# Patient Record
Sex: Male | Born: 1960 | Race: Black or African American | Hispanic: No | Marital: Single | Smoking: Current every day smoker
Health system: Southern US, Community
[De-identification: ages and names within clinical notes are randomized; demographics above are authoritative.]

## PROBLEM LIST (undated history)

## (undated) DIAGNOSIS — I639 Cerebral infarction, unspecified: Secondary | ICD-10-CM

## (undated) DIAGNOSIS — N189 Chronic kidney disease, unspecified: Secondary | ICD-10-CM

## (undated) DIAGNOSIS — I10 Essential (primary) hypertension: Secondary | ICD-10-CM

---

## 2005-05-05 ENCOUNTER — Emergency Department (HOSPITAL_COMMUNITY): Admission: EM | Admit: 2005-05-05 | Discharge: 2005-05-05 | Payer: Self-pay | Admitting: Emergency Medicine

## 2006-08-15 ENCOUNTER — Emergency Department (HOSPITAL_COMMUNITY): Admission: EM | Admit: 2006-08-15 | Discharge: 2006-08-15 | Payer: Self-pay | Admitting: Emergency Medicine

## 2007-03-28 ENCOUNTER — Emergency Department (HOSPITAL_COMMUNITY): Admission: EM | Admit: 2007-03-28 | Discharge: 2007-03-28 | Payer: Self-pay | Admitting: Emergency Medicine

## 2007-07-29 ENCOUNTER — Emergency Department (HOSPITAL_COMMUNITY): Admission: EM | Admit: 2007-07-29 | Discharge: 2007-07-29 | Payer: Self-pay | Admitting: Emergency Medicine

## 2007-09-27 ENCOUNTER — Emergency Department (HOSPITAL_COMMUNITY): Admission: EM | Admit: 2007-09-27 | Discharge: 2007-09-28 | Payer: Self-pay | Admitting: Emergency Medicine

## 2007-10-04 ENCOUNTER — Emergency Department (HOSPITAL_COMMUNITY): Admission: EM | Admit: 2007-10-04 | Discharge: 2007-10-04 | Payer: Self-pay | Admitting: Emergency Medicine

## 2008-04-23 ENCOUNTER — Emergency Department (HOSPITAL_COMMUNITY): Admission: EM | Admit: 2008-04-23 | Discharge: 2008-04-23 | Payer: Self-pay | Admitting: Emergency Medicine

## 2008-04-29 ENCOUNTER — Emergency Department (HOSPITAL_COMMUNITY): Admission: EM | Admit: 2008-04-29 | Discharge: 2008-04-29 | Payer: Self-pay | Admitting: Emergency Medicine

## 2009-08-24 ENCOUNTER — Emergency Department (HOSPITAL_COMMUNITY): Admission: EM | Admit: 2009-08-24 | Discharge: 2009-08-24 | Payer: Self-pay | Admitting: Emergency Medicine

## 2009-09-19 ENCOUNTER — Emergency Department (HOSPITAL_COMMUNITY): Admission: EM | Admit: 2009-09-19 | Discharge: 2009-09-19 | Payer: Self-pay | Admitting: Emergency Medicine

## 2010-02-21 ENCOUNTER — Emergency Department (HOSPITAL_COMMUNITY): Admission: EM | Admit: 2010-02-21 | Discharge: 2010-02-21 | Payer: Self-pay | Admitting: Emergency Medicine

## 2011-05-14 LAB — BASIC METABOLIC PANEL
Calcium: 8.6
GFR calc Af Amer: >60
GFR calc non Af Amer: >60
Glucose, Bld: 113 — ABNORMAL HIGH
Sodium: 137

## 2011-05-14 LAB — ETHANOL: Alcohol, Ethyl (B): 173 — ABNORMAL HIGH

## 2011-05-14 LAB — RAPID URINE DRUG SCREEN, HOSP PERFORMED
Amphetamines: NOT DETECTED
Opiates: NOT DETECTED

## 2021-12-24 ENCOUNTER — Other Ambulatory Visit: Payer: Self-pay

## 2021-12-24 ENCOUNTER — Inpatient Hospital Stay (HOSPITAL_COMMUNITY): Payer: Medicaid Other

## 2021-12-24 ENCOUNTER — Other Ambulatory Visit (HOSPITAL_COMMUNITY): Payer: Self-pay

## 2021-12-24 ENCOUNTER — Inpatient Hospital Stay (HOSPITAL_COMMUNITY)
Admission: EM | Admit: 2021-12-24 | Discharge: 2022-01-04 | DRG: 674 | Disposition: A | Discharge status: Skilled Nursing Facility | Payer: Medicaid Other | Attending: Internal Medicine | Admitting: Internal Medicine

## 2021-12-24 ENCOUNTER — Encounter (HOSPITAL_COMMUNITY): Payer: Self-pay | Admitting: Internal Medicine

## 2021-12-24 ENCOUNTER — Emergency Department (HOSPITAL_COMMUNITY): Payer: Medicaid Other

## 2021-12-24 DIAGNOSIS — E440 Moderate protein-calorie malnutrition: Secondary | ICD-10-CM | POA: Diagnosis present

## 2021-12-24 DIAGNOSIS — I120 Hypertensive chronic kidney disease with stage 5 chronic kidney disease or end stage renal disease: Secondary | ICD-10-CM | POA: Diagnosis present

## 2021-12-24 DIAGNOSIS — E8809 Other disorders of plasma-protein metabolism, not elsewhere classified: Secondary | ICD-10-CM | POA: Diagnosis present

## 2021-12-24 DIAGNOSIS — Z993 Dependence on wheelchair: Secondary | ICD-10-CM | POA: Diagnosis not present

## 2021-12-24 DIAGNOSIS — N186 End stage renal disease: Secondary | ICD-10-CM | POA: Diagnosis present

## 2021-12-24 DIAGNOSIS — Z79899 Other long term (current) drug therapy: Secondary | ICD-10-CM | POA: Diagnosis not present

## 2021-12-24 DIAGNOSIS — Z7902 Long term (current) use of antithrombotics/antiplatelets: Secondary | ICD-10-CM | POA: Diagnosis not present

## 2021-12-24 DIAGNOSIS — Z681 Body mass index (BMI) 19 or less, adult: Secondary | ICD-10-CM

## 2021-12-24 DIAGNOSIS — J9811 Atelectasis: Secondary | ICD-10-CM | POA: Diagnosis present

## 2021-12-24 DIAGNOSIS — F1010 Alcohol abuse, uncomplicated: Secondary | ICD-10-CM | POA: Diagnosis present

## 2021-12-24 DIAGNOSIS — Z720 Tobacco use: Secondary | ICD-10-CM | POA: Diagnosis present

## 2021-12-24 DIAGNOSIS — E785 Hyperlipidemia, unspecified: Secondary | ICD-10-CM | POA: Diagnosis present

## 2021-12-24 DIAGNOSIS — R1012 Left upper quadrant pain: Secondary | ICD-10-CM | POA: Diagnosis present

## 2021-12-24 DIAGNOSIS — D649 Anemia, unspecified: Secondary | ICD-10-CM | POA: Diagnosis not present

## 2021-12-24 DIAGNOSIS — I69354 Hemiplegia and hemiparesis following cerebral infarction affecting left non-dominant side: Secondary | ICD-10-CM | POA: Diagnosis not present

## 2021-12-24 DIAGNOSIS — N179 Acute kidney failure, unspecified: Principal | ICD-10-CM | POA: Diagnosis present

## 2021-12-24 DIAGNOSIS — N1832 Chronic kidney disease, stage 3b: Secondary | ICD-10-CM | POA: Diagnosis not present

## 2021-12-24 DIAGNOSIS — Z8673 Personal history of transient ischemic attack (TIA), and cerebral infarction without residual deficits: Secondary | ICD-10-CM | POA: Diagnosis not present

## 2021-12-24 DIAGNOSIS — R140 Abdominal distension (gaseous): Secondary | ICD-10-CM | POA: Diagnosis not present

## 2021-12-24 DIAGNOSIS — Z992 Dependence on renal dialysis: Secondary | ICD-10-CM

## 2021-12-24 DIAGNOSIS — F1290 Cannabis use, unspecified, uncomplicated: Secondary | ICD-10-CM | POA: Diagnosis present

## 2021-12-24 DIAGNOSIS — R600 Localized edema: Secondary | ICD-10-CM | POA: Diagnosis present

## 2021-12-24 DIAGNOSIS — Z751 Person awaiting admission to adequate facility elsewhere: Secondary | ICD-10-CM

## 2021-12-24 DIAGNOSIS — F17210 Nicotine dependence, cigarettes, uncomplicated: Secondary | ICD-10-CM | POA: Diagnosis present

## 2021-12-24 DIAGNOSIS — M7989 Other specified soft tissue disorders: Secondary | ICD-10-CM | POA: Diagnosis not present

## 2021-12-24 DIAGNOSIS — Z7982 Long term (current) use of aspirin: Secondary | ICD-10-CM | POA: Diagnosis not present

## 2021-12-24 DIAGNOSIS — D631 Anemia in chronic kidney disease: Secondary | ICD-10-CM | POA: Diagnosis present

## 2021-12-24 DIAGNOSIS — J90 Pleural effusion, not elsewhere classified: Secondary | ICD-10-CM | POA: Diagnosis present

## 2021-12-24 HISTORY — DX: Chronic kidney disease, unspecified: N18.9

## 2021-12-24 HISTORY — DX: Essential (primary) hypertension: I10

## 2021-12-24 HISTORY — DX: Cerebral infarction, unspecified: I63.9

## 2021-12-24 LAB — CBC WITH DIFFERENTIAL/PLATELET
Abs Immature Granulocytes: 0.04 10*3/uL (ref 0.00–0.07)
Basophils Absolute: 0 10*3/uL (ref 0.0–0.1)
Basophils Relative: 0 %
Eosinophils Absolute: 0.5 10*3/uL (ref 0.0–0.5)
Eosinophils Relative: 6 %
HCT: 21 % — ABNORMAL LOW (ref 39.0–52.0)
Hemoglobin: 6.9 g/dL — CL (ref 13.0–17.0)
Immature Granulocytes: 0 %
Lymphocytes Relative: 21 %
Lymphs Abs: 1.9 10*3/uL (ref 0.7–4.0)
MCH: 30.9 pg (ref 26.0–34.0)
MCHC: 32.9 g/dL (ref 30.0–36.0)
MCV: 94.2 fL (ref 80.0–100.0)
Monocytes Absolute: 0.8 10*3/uL (ref 0.1–1.0)
Monocytes Relative: 8 %
Neutro Abs: 5.8 10*3/uL (ref 1.7–7.7)
Neutrophils Relative %: 65 %
Platelets: 139 10*3/uL — ABNORMAL LOW (ref 150–400)
RBC: 2.23 MIL/uL — ABNORMAL LOW (ref 4.22–5.81)
RDW: 15.9 % — ABNORMAL HIGH (ref 11.5–15.5)
WBC: 9 10*3/uL (ref 4.0–10.5)
nRBC: 0 % (ref 0.0–0.2)

## 2021-12-24 LAB — PROTIME-INR
INR: 1.1 (ref 0.8–1.2)
Prothrombin Time: 14.2 seconds (ref 11.4–15.2)

## 2021-12-24 LAB — COMPREHENSIVE METABOLIC PANEL
ALT: 8 U/L (ref 0–44)
AST: 9 U/L — ABNORMAL LOW (ref 15–41)
Albumin: 2.4 g/dL — ABNORMAL LOW (ref 3.5–5.0)
Alkaline Phosphatase: 90 U/L (ref 38–126)
Anion gap: 13 (ref 5–15)
BUN: 100 mg/dL — ABNORMAL HIGH (ref 6–20)
CO2: 12 mmol/L — ABNORMAL LOW (ref 22–32)
Calcium: 8.1 mg/dL — ABNORMAL LOW (ref 8.9–10.3)
Chloride: 117 mmol/L — ABNORMAL HIGH (ref 98–111)
Creatinine, Ser: 13.84 mg/dL — ABNORMAL HIGH (ref 0.61–1.24)
GFR, Estimated: 4 mL/min — ABNORMAL LOW (ref >60)
Glucose, Bld: 99 mg/dL (ref 70–99)
Potassium: 4.6 mmol/L (ref 3.5–5.1)
Sodium: 142 mmol/L (ref 135–145)
Total Bilirubin: 0.5 mg/dL (ref 0.3–1.2)
Total Protein: 6.8 g/dL (ref 6.5–8.1)

## 2021-12-24 LAB — RETICULOCYTES
Immature Retic Fract: 12.2 % (ref 2.3–15.9)
RBC.: 2.26 MIL/uL — ABNORMAL LOW (ref 4.22–5.81)
Retic Count, Absolute: 77.7 10*3/uL (ref 19.0–186.0)
Retic Ct Pct: 3.4 % — ABNORMAL HIGH (ref 0.4–3.1)

## 2021-12-24 LAB — IRON AND TIBC
Iron: 69 ug/dL (ref 45–182)
Saturation Ratios: 29 % (ref 17.9–39.5)
TIBC: 241 ug/dL — ABNORMAL LOW (ref 250–450)
UIBC: 172 ug/dL

## 2021-12-24 LAB — FERRITIN: Ferritin: 459 ng/mL — ABNORMAL HIGH (ref 24–336)

## 2021-12-24 LAB — FOLATE: Folate: 15.7 ng/mL (ref >5.9)

## 2021-12-24 LAB — VITAMIN B12: Vitamin B-12: 2631 pg/mL — ABNORMAL HIGH (ref 180–914)

## 2021-12-24 LAB — PREPARE RBC (CROSSMATCH)

## 2021-12-24 LAB — ABO/RH: ABO/RH(D): O POS

## 2021-12-24 MED ORDER — ATORVASTATIN CALCIUM 80 MG PO TABS
80.0000 mg | ORAL_TABLET | Freq: Every day | ORAL | Status: DC
  Administered 2021-12-24 – 2021-12-27 (×4): 80 mg via ORAL
  Filled 2021-12-24 (×3): qty 1
  Filled 2021-12-24: qty 2
  Filled 2021-12-24: qty 1

## 2021-12-24 MED ORDER — PANTOPRAZOLE SODIUM 40 MG PO TBEC
40.0000 mg | DELAYED_RELEASE_TABLET | Freq: Every day | ORAL | Status: DC
Start: 2021-12-25 — End: 2021-12-31
  Administered 2021-12-25 – 2021-12-30 (×5): 40 mg via ORAL
  Filled 2021-12-24 (×5): qty 1

## 2021-12-24 MED ORDER — FUROSEMIDE 10 MG/ML IJ SOLN: 120.0000 mg | Freq: Once | INTRAVENOUS | Status: DC

## 2021-12-24 MED ORDER — HYDRALAZINE HCL 20 MG/ML IJ SOLN
10.0000 mg | Freq: Once | INTRAMUSCULAR | Status: AC
  Administered 2021-12-24: 10 mg via INTRAVENOUS
  Filled 2021-12-24: qty 1

## 2021-12-24 MED ORDER — MELATONIN 3 MG PO TABS
3.0000 mg | ORAL_TABLET | Freq: Every day | ORAL | Status: DC
  Administered 2021-12-24 – 2022-01-03 (×11): 3 mg via ORAL
  Filled 2021-12-24 (×11): qty 1

## 2021-12-24 MED ORDER — SODIUM CHLORIDE 0.9% IV SOLUTION: Freq: Once | INTRAVENOUS | Status: AC

## 2021-12-24 MED ORDER — FUROSEMIDE 10 MG/ML IJ SOLN
80.0000 mg | Freq: Once | INTRAMUSCULAR | Status: AC
  Administered 2021-12-25: 80 mg via INTRAVENOUS
  Filled 2021-12-24: qty 8

## 2021-12-24 MED ORDER — LABETALOL HCL 100 MG PO TABS
100.0000 mg | ORAL_TABLET | Freq: Two times a day (BID) | ORAL | Status: DC
  Administered 2021-12-24 – 2021-12-27 (×7): 100 mg via ORAL
  Filled 2021-12-24 (×8): qty 1

## 2021-12-24 NOTE — ED Notes (Signed)
Patient transported upstairs on bedside monitor by this RN

## 2021-12-24 NOTE — Assessment & Plan Note (Addendum)
Appreciate nephrology consult Urine electrolytes ordered Npo post midnight in case will need access for HD Administer a dose of lasix (80 mg)  after blood transfusion

## 2021-12-24 NOTE — Assessment & Plan Note (Signed)
Will transfuse one unit Follow serial cbc Check anemia panel and hemoccult stool

## 2021-12-24 NOTE — Assessment & Plan Note (Signed)
Right more than left given unequal leg edema will order doppler

## 2021-12-24 NOTE — ED Triage Notes (Signed)
Pt states that he went to neuphrology appt today and they drew labs. MD notified nursing home of abnormal labs and to transfer pt here for HD and transfusion. MD states labs resulted at gfr 4, HGB 6.7, and Cr. 13.25.

## 2021-12-24 NOTE — H&P (Addendum)
[name] IPJ:[id number] DOB: [birth year] DOA: 12/24/2021     PCP: Merryl Hacker, No   Outpatient Specialists:     [facility]phrology:     [facility]      Patient arrived to ER on 12/24/21 at 1736 Referred by Attending Drenda Freeze, MD   Patient coming from:    From rehab    Chief Complaint:   Chief Complaint  Patient presents with   Abnormal Lab    HPI: Cody Macdonald is a 61 y.o. male with medical history significant of CKD, CVA, HTN, Etoh abuse tobacco abuse, anemia    Presented with  abnormal labs Came in  Had a stroke 1 m ago and was found to have renal failure Today had follow up appointment His cr went up from 5 to 47 now Also noted to have anemia Hypertensive with bp up to 105   Was seen by Nephrology today  With [facility]  Was found to have Cr 13.25 Hg 6.7 After review of records seems that cr was around 1 a year ago was up to 3 on recent admit at [facility] and now up to 9   NOvant had work up  Renal US April 2023. Echogenic kidneys which can be seen in medical renal disease. No hydronephrosis.     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  Lab Results  Component Value Date   CREATININE 13.84 (H) 12/24/2021   CREATININE 0.96 03/28/2007       Chronic anemia - baseline hg Hemoglobin & Hematocrit  Recent Labs    12/24/21 1846  HGB 6.9*     While in ER:     Noted to have HG down to 6.9   Ordered anemia panel nephrology is aware   CXR - Small right pleural effusion with associated right basilar likely subsegmental atelectasis.    Following Medications were ordered in ER: Medications  0.9 %  sodium chloride infusion (Manually program via Guardrails IV Fluids) (has no administration in time range)  hydrALAZINE (APRESOLINE) injection 10 mg (10 mg Intravenous Given 12/24/21 1902)    _______________________________________________________ ER Provider Called:    Nephrology  Dr.Sing They Recommend admit to medicine   Will see in AM      ED Triage Vitals  Enc Vitals Group     BP 12/24/21 1748 (!) 222/112     Pulse Rate 12/24/21 1748 84     Resp 12/24/21 1748 20     Temp 12/24/21 1748 98.4 F (36.9 C)     Temp Source 12/24/21 1748 Oral     SpO2 12/24/21 1748 99 %     Weight 12/24/21 2003 128 lb (58.1 kg)     Height 12/24/21 2003 5\' 7"  (1.702 m)     Head Circumference --  Peak Flow --      Pain Score 12/24/21 1743 0     Pain Loc --      Pain Edu? --      Excl. in [place]? --   TMAX(24)@     _________________________________________ Significant initial  Findings: Abnormal Labs Reviewed  CBC WITH DIFFERENTIAL/PLATELET - Abnormal; Notable for the following components:      Result Value   RBC 2.23 (*)    Hemoglobin 6.9 (*)    HCT 21.0 (*)    RDW 15.9 (*)    Platelets 139 (*)    All other components within normal limits  COMPREHENSIVE METABOLIC PANEL - Abnormal; Notable for the following components:   Chloride 117 (*)    CO2 12 (*)    BUN 100 (*)    Creatinine, Ser 13.84 (*)    Calcium 8.1 (*)    Albumin 2.4 (*)    AST 9 (*)    GFR, Estimated 4 (*)    All other components within normal limits  RETICULOCYTES - Abnormal; Notable for the following components:   Retic Ct Pct 3.4 (*)    RBC. 2.26 (*)    All other components within normal limits     ECG: Ordered      The recent clinical data is shown below. Vitals:    12/24/21 1845 12/24/21 1915 12/24/21 1959 12/24/21 2003  BP: (!) 200/103 (!) 196/92 (!) 183/92   Pulse: 83 90 89   Resp:   18   Temp:   98.1 F (36.7 C)   TempSrc:   Oral   SpO2: 100% 100% 100%   Weight:    58.1 kg  Height:    5\' 7"  (1.702 m)     WBC     Component Value Date/Time   WBC 9.0 12/24/2021 1846   LYMPHSABS 1.9 12/24/2021 1846   MONOABS 0.8 12/24/2021 1846   EOSABS 0.5 12/24/2021 1846   BASOSABS 0.0 12/24/2021 1846     UA  ordered    _______________________________________________ Hospitalist was called for admission for   Low hemoglobin acute on chronic renal failure    The following Work up has been ordered so far:  Orders Placed This Encounter  Procedures   DG Chest Port 1 View   CBC with Differential   Comprehensive metabolic panel   Protime-INR   Urinalysis, Routine w reflex microscopic   Vitamin B12   Folate   Iron and TIBC   Ferritin   Reticulocytes   Rapid urine drug screen (hospital performed)   Informed Consent Details: Physician/Practitioner Attestation; Transcribe to consent form and obtain patient signature   Pharmacy Tech to prioritize PTA Med Rec - [facility] ONLY   Consult to nephrology   Consult for Unassigned Medical Admission   Type and screen [place]   Prepare RBC (crossmatch)   ABO/Rh     OTHER Significant initial  Findings:  labs showing:    Recent Labs  Lab 12/24/21 1846  NA 142  K 4.6  CO2 12*  GLUCOSE 99  BUN 100*  CREATININE 13.84*  CALCIUM 8.1*    Cr   Up from baseline see below Lab Results  Component Value Date   CREATININE 13.84 (H) 12/24/2021   CREATININE 0.96 03/28/2007    Recent Labs  Lab 12/24/21 1846  AST 9*  ALT 8  ALKPHOS 90  BILITOT 0.5  PROT 6.8  ALBUMIN 2.4*   Lab Results  Component Value Date   CALCIUM  8.1 (L) 12/24/2021    Plt: Lab Results  Component Value Date   PLT 139 (L) 12/24/2021    Venous  Blood Gas ordered Recent Labs  Lab 12/24/21 1846  WBC  9.0  NEUTROABS 5.8  HGB 6.9*  HCT 21.0*  MCV 94.2  PLT 139*    HG/HCT  Down  from baseline see below    Component Value Date/Time   HGB 6.9 (LL) 12/24/2021 1846   HCT 21.0 (L) 12/24/2021 1846   MCV 94.2 12/24/2021 1846         Cultures: No results found for: SDES, [place], CULT, REPTSTATUS   Radiological Exams on Admission: DG Chest Port 1 View  Result Date: 12/24/2021 CLINICAL DATA:  Shortness of breath. EXAM: PORTABLE CHEST 1 VIEW COMPARISON:  None Available. FINDINGS: Cardiac silhouette and mediastinal contours are within normal limits. There is a small right pleural effusion with associated basilar opacity. The left lung is clear. No pneumothorax. No acute skeletal abnormality. Surgical clips overlie the gastroesophageal junction. IMPRESSION: Small right pleural effusion with associated right basilar likely subsegmental atelectasis. Electronically Signed   By: Yvonne Kendall M.D.   On: 12/24/2021 18:52   _______________________________________________________________________________________________________ Latest  Blood pressure (!) 183/92, pulse 89, temperature 98.1 F (36.7 C), temperature source Oral, resp. rate 18, height 5\' 7"  (1.702 m), weight 58.1 kg, SpO2 100 %.   Vitals  labs and radiology finding personally reviewed  Review of Systems:    Pertinent positives include:  Bilateral lower extremity swelling   Constitutional:  No weight loss, night sweats, Fevers, chills, fatigue, weight loss  HEENT:  No headaches, Difficulty swallowing,Tooth/dental problems,Sore throat,  No sneezing, itching, ear ache, nasal congestion, post nasal drip,  Cardio-vascular:  No chest pain, Orthopnea, PND, anasarca, dizziness, palpitations.no  GI:  No heartburn, indigestion, abdominal pain, nausea, vomiting, diarrhea, change in bowel habits, loss of appetite, melena, blood in stool, hematemesis Resp:  no shortness of breath at rest. No dyspnea on exertion, No excess mucus, no  productive cough, No non-productive cough, No coughing up of blood.No change in color of mucus.No wheezing. Skin:  no rash or lesions. No jaundice GU:  no dysuria, change in color of urine, no urgency or frequency. No straining to urinate.  No flank pain.  Musculoskeletal:  No joint pain or no joint swelling. No decreased range of motion. No back pain.  Psych:  No change in mood or affect. No depression or anxiety. No memory loss.  Neuro: no localizing neurological complaints, no tingling, no weakness, no double vision, no gait abnormality, no slurred speech, no confusion  All systems reviewed and apart from [place] all are negative _______________________________________________________________________________________________ Past Medical History:   Past Medical History:  Diagnosis Date   Chronic kidney disease    Hypertension    Stroke [facility])       History reviewed. No pertinent surgical history.  Social History:  Ambulatory   wheelchair bound     reports that he has been smoking cigarettes. He has been smoking an average of .25 packs per day. He has never used smokeless tobacco. He reports current alcohol use of about 1.0 standard drink per week. He reports current drug use. Drug: Marijuana. No longer smokes    Family History:  No family hx of kidney diseaSE ______________________________________________________________________________________________ Allergies: No Known Allergies   Prior to Admission medications   Not on File    ___________________________________________________________________________________________________ Physical Exam:    12/24/2021    8:03 PM 12/24/2021    7:59 PM  12/24/2021    7:15 PM  Vitals with BMI  Height 5\' 7"     Weight 128 lbs    BMI 28.76    Systolic  811 572  Diastolic  92 92  Pulse  89 90     1. General:  in No  Acute distress    Chronically ill   -appearing 2. Psychological: Alert and   Oriented 3. Head/ENT:   Moist   Mucous Membranes                          Head Non traumatic, neck supple                        Poor Dentition Past tracheostomy 4. SKIN:  decreased Skin turgor,  Skin clean Dry and intact no rash 5. Heart: Regular rate and rhythm no  Murmur, no Rub or gallop 6. Lungs:  Clear to auscultation bilaterally, no wheezes or crackles   7. Abdomen: Soft,  LLQ mildly-tender, Non distended  bowel sounds present 8. Lower extremities: no clubbing, cyanosis, 2+ edema R >L 9. Neurologically Left side flaccid paralysis, 10. MSK: Normal range of motion    Chart has been reviewed  ______________________________________________________________________________________________  Assessment/Plan 61 y.o. male with medical history significant of CKD, CVA, HTN, Etoh abuse tobacco abuse, anemia   Admitted for   Low hemoglobin, acute on chronic renal failure    Present on Admission:  Acute renal failure superimposed on stage 3b chronic kidney disease (HCC)  Symptomatic anemia  Tobacco abuse  Alcohol abuse  Hypoalbuminemia  Abdominal distention  Leg edema     Acute renal failure superimposed on stage 3b chronic kidney disease ([place]) Appreciate nephrology consult Urine electrolytes ordered Npo post midnight in case will need access for HD Administer a dose of lasix (80 mg)  after blood transfusion   Symptomatic anemia Will transfuse one unit Follow serial cbc Check anemia panel and hemoccult stool  History of stroke In April pt was prescribed Asprin and Plavix Given precipitous drop in HG and need for proceedure will hold Plavix for now    Alcohol abuse Monitor for any sign of withdrowal, have not been drinking while at the facility  Hypoalbuminemia Liver function wnl  Check prealbumin Check UA for protein Nephrotic syndrome?   Tobacco abuse  - Spoke about importance of quitting spent 5 minutes discussing options for treatment, prior attempts at quitting, and dangers of smoking  -At  this point patient is   interested in quitting  - order nicotine patch   - nursing tobacco cessation protocol   Abdominal distention Obtain KUB  Leg edema Right more than left given unequal leg edema will order doppler    Other plan as per orders.  DVT prophylaxis:  SCD     Code Status:    Code Status: Not on file FULL CODE  as per patient   I had personally discussed CODE STATUS with patient      Family Communication:   Family not at  Bedside    Disposition Plan:                            Back to current facility when stable                             Following barriers for discharge:  Electrolytes corrected                               Anemia corrected                                                      Will need consultants to evaluate patient prior to discharge                      Would benefit from PT/OT eval prior to DC  Ordered                                      Transition of care consulted                   Nutrition    consulted                                       Consults called:  NEPHROLOGY   Admission status:  ED Disposition     ED Disposition  [place]: [place] [[id number]]  Level of Care: Progressive [102]  Admit to Progressive based on following criteria: NEPHROLOGY stable condition requiring close monitoring for AKI, requiring Hemodialysis or Peritoneal Dialysis either from expected electrolyte imbalance, acidosis, or fluid overload that can be managed by NIPPV or high flow oxygen.  May admit patient to [facility] or [facility] if equivalent level of care is available:: No  Covid Evaluation: Asymptomatic - no recent exposure (last 10 days) testing not required  Diagnosis: AKI (acute kidney injury) [facility]) [[id number]]  Admitting Physician: Toy Baker [[id number]]  Attending Physician: Toy Baker [[id number]]  Estimated length of stay: past midnight  tomorrow  Certification:: I certify this patient will need inpatient services for at least 2 midnights           inpatient     I Expect 2 midnight stay secondary to severity of patient's current illness need for inpatient interventions justified by the following:    Severe lab/radiological/exam abnormalities including:  ANEMIA AND KIDNEY FAILURE   and extensive comorbidities including:  CKD   That are currently affecting medical management.   I expect  patient to be hospitalized for 2 midnights requiring inpatient medical care.  Patient is at high risk for adverse outcome (such as loss of life or disability) if not treated.  Indication for inpatient stay as follows:    Hemodynamic instability despite maximal medical therapy,    Need for operative/procedural  intervention     Need for IV lasix may need HD during this admit   Level of care         progressive tele indefinitely please discontinue once patient no longer qualifies COVID-19 Labs    Joaquin Knebel 12/24/2021, 9:38 PM    [facility]     after 2 AM please page floor coverage PA If 7AM-7PM, please contact the day team taking care of the patient using [email or web address]   Patient was evaluated in the context of the global COVID-19 pandemic, which  necessitated consideration that the patient might be at risk for infection with the SARS-CoV-2 virus that causes COVID-19. Institutional protocols and algorithms that pertain to the evaluation of patients at risk for COVID-19 are in a state of rapid change based on information released by regulatory bodies including the CDC and federal and state organizations. These policies and algorithms were followed during the patient's care.

## 2021-12-24 NOTE — ED Provider Notes (Signed)
[facility] EMERGENCY DEPARTMENT Provider Note   CSN: [id number] Arrival date & time: 12/24/21  1736     History  Chief Complaint  Patient presents with   Abnormal Lab    Cody Macdonald is a 61 y.o. male.  61 year old male with a past medical history of CVA, hypertension, presents to the ED sent by Dr. Royce Macadamia for low globin.  Patient is currently at a rehab facility [facility], he was sent over to nephrology for further evaluation of his worsening kidney function.  According to patient he has never received dialysis.  Basic blood work was drawn in office, were remarkable for a GFR of 4, hemoglobin of 6.7, creatinine of 13.25.He denies any headache, chest pain, shortness of breath.  The history is provided by the patient and medical records.  Abnormal Lab Patient referred by:  Specialist Result type: hematology   Hematology:    Hemoglobin:  Low   Other abnormal hematology result:  6.7      Home Medications Prior to Admission medications   Medication Sig Start Date End Date Taking? Authorizing Provider  aspirin EC 81 MG tablet Take 81 mg by mouth daily. Swallow whole.   Yes [provider]  atorvastatin (LIPITOR) 80 MG tablet Take 80 mg by mouth at bedtime. 12/22/21  Yes [provider]  clopidogrel (PLAVIX) 75 MG tablet Take 75 mg by mouth daily. 12/20/21  Yes [provider]  ergocalciferol (VITAMIN D2) 1.25 MG (50000 UT) capsule Take 50,000 Units by mouth every Tuesday.   Yes [provider]  melatonin 3 MG TABS tablet Take 3 mg by mouth at bedtime as needed (sleep).   Yes [provider]  pantoprazole (PROTONIX) 40 MG tablet Take 40 mg by mouth daily. 12/20/21  Yes [provider]  albuterol (PROVENTIL) (2.5 MG/3ML) 0.083% nebulizer solution Take 3 mLs (2.5 mg total) by nebulization every 6 (six) hours as needed for wheezing or shortness of breath. 01/01/22   Ghimire, Henreitta Leber, MD  amLODipine (NORVASC)  10 MG tablet Take 1 tablet (10 mg total) by mouth daily. 01/01/22   Ghimire, Henreitta Leber, MD  feeding supplement (ENSURE ENLIVE / ENSURE PLUS) LIQD Take 237 mLs by mouth 2 (two) times daily between meals. 01/01/22   Ghimire, Henreitta Leber, MD  hydrALAZINE (APRESOLINE) 50 MG tablet Take 1 tablet (50 mg total) by mouth every 8 (eight) hours. 01/01/22   Ghimire, Henreitta Leber, MD  labetalol (NORMODYNE) 300 MG tablet Take 1 tablet (300 mg total) by mouth 2 (two) times daily. 01/01/22   Ghimire, Henreitta Leber, MD  multivitamin (RENA-VIT) TABS tablet Take 1 tablet by mouth at bedtime. 01/01/22   Ghimire, Henreitta Leber, MD  oxyCODONE-acetaminophen (PERCOCET) 5-325 MG tablet Take 1 tablet by mouth every 8 (eight) hours as needed for severe pain. 01/01/22 01/01/23  Ghimire, Henreitta Leber, MD  sevelamer carbonate (RENVELA) 800 MG tablet Take 1 tablet (800 mg total) by mouth 3 (three) times daily with meals. 01/01/22   Ghimire, Henreitta Leber, MD      Allergies    Patient has no known allergies.    Review of Systems   Review of Systems  Constitutional:  Negative for chills and fever.  HENT:  Negative for sore throat.   Respiratory:  Negative for shortness of breath.   Cardiovascular:  Negative for chest pain.  Gastrointestinal:  Negative for abdominal pain, nausea and vomiting.  Genitourinary:  Negative for flank pain.  Musculoskeletal:  Negative for back pain.  Neurological:  Negative for light-headedness and headaches.  All other systems reviewed and are negative.   Physical Exam Updated Vital Signs BP (!) 160/89   Pulse 71   Temp 98 F (36.7 C) (Oral)   Resp 17   Ht 5\' 7"  (1.702 m)   Wt 52.3 kg   SpO2 98%   BMI 18.06 kg/m  Physical Exam Vitals and nursing note reviewed.  Constitutional:      Appearance: Normal appearance.  HENT:     Head: Normocephalic and atraumatic.  Eyes:     Pupils: Pupils are equal, round, and reactive to light.  Cardiovascular:     Rate and Rhythm: Normal rate.  Pulmonary:     Effort: Pulmonary  effort is normal.     Breath sounds: No wheezing.  Abdominal:     General: Abdomen is flat.     Palpations: Abdomen is soft.     Tenderness: There is no abdominal tenderness.  Musculoskeletal:     Right lower leg: 1+ Edema present.     Left lower leg: 2+ Edema present.  Skin:    General: Skin is warm and dry.     Coloration: Skin is pale.  Neurological:     Mental Status: He is alert and oriented to person, place, and time.     Motor: Weakness present.     Comments: Left sided hemiparesis 2/5  with upper and lower extremity.      ED Results / Procedures / Treatments   Labs (all labs ordered are listed, but only abnormal results are displayed) Labs Reviewed  CBC WITH DIFFERENTIAL/PLATELET - Abnormal; Notable for the following components:      Result Value   RBC 2.23 (*)    Hemoglobin 6.9 (*)    HCT 21.0 (*)    RDW 15.9 (*)    Platelets 139 (*)    All other components within normal limits  COMPREHENSIVE METABOLIC PANEL - Abnormal; Notable for the following components:   Chloride 117 (*)    CO2 12 (*)    BUN 100 (*)    Creatinine, Ser 13.84 (*)    Calcium 8.1 (*)    Albumin 2.4 (*)    AST 9 (*)    GFR, Estimated 4 (*)    All other components within normal limits  VITAMIN B12 - Abnormal; Notable for the following components:   Vitamin B-12 2,631 (*)    All other components within normal limits  IRON AND TIBC - Abnormal; Notable for the following components:   TIBC 241 (*)    All other components within normal limits  FERRITIN - Abnormal; Notable for the following components:   Ferritin 459 (*)    All other components within normal limits  RETICULOCYTES - Abnormal; Notable for the following components:   Retic Ct Pct 3.4 (*)    RBC. 2.26 (*)    All other components within normal limits  PHOSPHORUS - Abnormal; Notable for the following components:   Phosphorus 8.3 (*)    All other components within normal limits  OSMOLALITY - Abnormal; Notable for the following  components:   Osmolality 331 (*)    All other components within normal limits  PREALBUMIN - Abnormal; Notable for the following components:   Prealbumin 17.1 (*)    All other components within normal limits  BLOOD GAS, VENOUS - Abnormal; Notable for the following components:   pCO2, Ven 24 (*)    pO2, Ven 74 (*)    Bicarbonate 12.7 (*)  Acid-base deficit 11.3 (*)    All other components within normal limits  CBC - Abnormal; Notable for the following components:   RBC 2.28 (*)    Hemoglobin 6.6 (*)    HCT 20.5 (*)    RDW 15.8 (*)    Platelets 124 (*)    All other components within normal limits  CBC - Abnormal; Notable for the following components:   RBC 2.27 (*)    Hemoglobin 6.8 (*)    HCT 20.6 (*)    RDW 16.1 (*)    Platelets 121 (*)    All other components within normal limits  LIPID PANEL - Abnormal; Notable for the following components:   HDL 30 (*)    All other components within normal limits  HEPATITIS B SURFACE ANTIBODY,QUALITATIVE - Abnormal; Notable for the following components:   Hep B S Ab Reactive (*)    All other components within normal limits  PARATHYROID HORMONE, INTACT (NO CA) - Abnormal; Notable for the following components:   PTH 174 (*)    All other components within normal limits  CBC - Abnormal; Notable for the following components:   RBC 3.68 (*)    Hemoglobin 11.0 (*)    HCT 31.1 (*)    RDW 17.2 (*)    Platelets 117 (*)    All other components within normal limits  CBC - Abnormal; Notable for the following components:   WBC 11.8 (*)    RBC 2.93 (*)    Hemoglobin 8.8 (*)    HCT 25.2 (*)    RDW 18.3 (*)    Platelets 105 (*)    All other components within normal limits  RENAL FUNCTION PANEL - Abnormal; Notable for the following components:   CO2 18 (*)    BUN 67 (*)    Creatinine, Ser 10.24 (*)    Calcium 8.3 (*)    Phosphorus 8.1 (*)    Albumin 2.0 (*)    GFR, Estimated 5 (*)    All other components within normal limits  CBC -  Abnormal; Notable for the following components:   RBC 2.43 (*)    Hemoglobin 7.0 (*)    HCT 21.5 (*)    RDW 16.6 (*)    Platelets 98 (*)    All other components within normal limits  RENAL FUNCTION PANEL - Abnormal; Notable for the following components:   Glucose, Bld 115 (*)    BUN 62 (*)    Creatinine, Ser 8.59 (*)    Calcium 8.0 (*)    Phosphorus 5.7 (*)    Albumin 1.9 (*)    GFR, Estimated 7 (*)    All other components within normal limits  CBC - Abnormal; Notable for the following components:   RBC 2.92 (*)    Hemoglobin 8.7 (*)    HCT 25.9 (*)    Platelets 104 (*)    All other components within normal limits  RENAL FUNCTION PANEL - Abnormal; Notable for the following components:   Glucose, Bld 101 (*)    BUN 27 (*)    Creatinine, Ser 5.41 (*)    Calcium 8.1 (*)    Albumin 1.9 (*)    GFR, Estimated 11 (*)    All other components within normal limits  CBC - Abnormal; Notable for the following components:   RBC 3.81 (*)    Hemoglobin 11.5 (*)    HCT 32.4 (*)    RDW 15.6 (*)    Platelets 114 (*)  All other components within normal limits  CBC WITH DIFFERENTIAL/PLATELET - Abnormal; Notable for the following components:   RBC 3.44 (*)    Hemoglobin 10.2 (*)    HCT 31.0 (*)    RDW 15.9 (*)    Platelets 137 (*)    Lymphs Abs 0.5 (*)    All other components within normal limits  CBC - Abnormal; Notable for the following components:   RBC 2.78 (*)    Hemoglobin 8.3 (*)    HCT 25.1 (*)    Platelets 130 (*)    All other components within normal limits  CBC - Abnormal; Notable for the following components:   RBC 2.69 (*)    Hemoglobin 8.3 (*)    HCT 24.7 (*)    All other components within normal limits  BASIC METABOLIC PANEL - Abnormal; Notable for the following components:   Sodium 133 (*)    BUN 37 (*)    Creatinine, Ser 7.77 (*)    Calcium 8.6 (*)    GFR, Estimated 7 (*)    All other components within normal limits  CULTURE, BLOOD (ROUTINE X 2)  CULTURE,  BLOOD (ROUTINE X 2)  PROTIME-INR  FOLATE  CK  MAGNESIUM  TSH  HEPATITIS B SURFACE ANTIGEN  HEPATITIS B SURFACE ANTIBODY, QUANTITATIVE  LACTIC ACID, PLASMA  HEPATITIS B CORE ANTIBODY, TOTAL  TYPE AND SCREEN  PREPARE RBC (CROSSMATCH)  ABO/RH  PREPARE RBC (CROSSMATCH)  TYPE AND SCREEN  PREPARE RBC (CROSSMATCH)    EKG EKG Interpretation  Date/Time:  Thursday Dec 24 2021 21:40:16 EDT Ventricular Rate:  87 PR Interval:  127 QRS Duration: 78 QT Interval:  414 QTC Calculation: 499 R Axis:   35 Text Interpretation: Sinus rhythm Anterior infarct, old Non-specific ST-t changes Anterior leads No old tracing to compare Confirmed by Pattricia Boss [phone]) on 12/25/2021 4:03:29 PM  Radiology No results found.  Procedures Procedures    Medications Ordered in ED Medications  fentaNYL (SUBLIMAZE) 100 MCG/2ML injection (  Not Given 12/25/21 1114)  midazolam (VERSED) 2 MG/2ML injection (  Not Given 12/25/21 1105)  heparin sodium (porcine) 1000 UNIT/ML injection (has no administration in time range)  hydrALAZINE (APRESOLINE) injection 10 mg (10 mg Intravenous Given 12/24/21 1902)  0.9 %  sodium chloride infusion (Manually program via Guardrails IV Fluids) (0 mLs Intravenous Stopping previously hung infusion 12/25/21 1200)  0.9 %  sodium chloride infusion (Manually program via Guardrails IV Fluids) (0 mLs Intravenous Stopping previously hung infusion 12/25/21 1200)  furosemide (LASIX) injection 80 mg (80 mg Intravenous Given 12/25/21 0100)  0.9 %  sodium chloride infusion (Manually program via Guardrails IV Fluids) (0 mLs Intravenous Stopped 12/25/21 2007)  acetaminophen (TYLENOL) tablet 650 mg (650 mg Oral Given 12/25/21 1139)  diphenhydrAMINE (BENADRYL) injection 25 mg (25 mg Intravenous Given 12/25/21 1140)  ceFAZolin (ANCEF) IVPB 2g/100 mL premix (0 g Intravenous Stopping previously hung infusion 12/25/21 1116)  midazolam (VERSED) injection (1 mg Intravenous Given 12/25/21 1018)  fentaNYL  (SUBLIMAZE) injection (50 mcg Intravenous Given 12/25/21 1018)  lidocaine-EPINEPHrine (XYLOCAINE W/EPI) 1 %-1:100000 (with pres) injection (15 mLs Intradermal Given 12/25/21 1010)  0.9 %  sodium chloride infusion (0 mLs Intravenous Stopping previously hung infusion 12/25/21 1116)  heparin sodium (porcine) injection (3,200 Units Intravenous Given 12/25/21 1023)  0.9 %  sodium chloride infusion (Manually program via Guardrails IV Fluids) ( Intravenous New Bag/Given 12/28/21 1113)  acetaminophen (TYLENOL) tablet 650 mg (650 mg Oral Given 12/28/21 1113)  diphenhydrAMINE (BENADRYL) injection 25 mg (25  mg Intravenous Given 12/28/21 1113)  heparin sodium (porcine) 1000 UNIT/ML injection (1,000 Units  Given 12/28/21 1641)  hydrALAZINE (APRESOLINE) injection 10 mg (10 mg Intravenous Given 12/28/21 2002)  acetaminophen (TYLENOL) tablet 650 mg (650 mg Oral Given 12/28/21 2000)  acetaminophen (TYLENOL) tablet 325 mg (325 mg Oral Given 12/28/21 2054)  hydrALAZINE (APRESOLINE) injection 5 mg (5 mg Intravenous Given 12/28/21 2055)  heparin sodium (porcine) 1000 UNIT/ML injection (1,000 Units Intracatheter Given 12/30/21 2032)  heparin sodium (porcine) 1000 UNIT/ML injection (  Duplicate 11/30/68 0174)    ED Course/ Medical Decision Making/ A&P                           Medical Decision Making Amount and/or Complexity of Data Reviewed Labs: ordered. Radiology: ordered.  Risk Prescription drug management. Decision regarding hospitalization.   This patient presents to the ED for concern of low hemoglobin, this involves a number of treatment options, and is a complaint that carries with it a high risk of complications and morbidity.  The differential diagnosis includes GI bleed, symptomatic anemia versus worsen CVA.    Co morbidities: Discussed in HPI   Brief History:  Patient with underlying history of CVA, left hemiparesis residual from previous, hypertension, acute kidney failure and tobacco abuse presents to  the ED from Dr. Royce Macadamia nephrology office for low hemoglobin, worsening kidney function, worsening GFR.  Patient reports no symptoms.  Arrived hypertensive with a systolic in the 944H, diastolic 675.  EMR reviewed including pt PMHx, past surgical history and past visits to ER.   See HPI for more details   Lab Tests:  I ordered and independently interpreted labs.  The pertinent results include:    Pending   Imaging Studies:  Pending     Cardiac Monitoring:  The patient was maintained on a cardiac monitor.  I personally viewed and interpreted the cardiac monitored which showed an underlying rhythm of: NSR EKG non-ischemic   Medicines ordered:  I ordered medication including hydralazine  for BP control Reevaluation of the patient after these medicines showed that the patient stayed the same I have reviewed the patients home medicines and have made adjustments as needed  Consults:  I requested consultation with Dr. Candiss Norse, nephrology and discussed lab and imaging findings as well as pertinent plan - they recommend: Transfusion, will likely need intervention tomorrow morning for dialysis access.  Will need 80 of Lasix after transfusion.  Patient care signed out to Dr. Darl Householder pending labs and admission.   Portions of this note were generated with Lobbyist. Dictation errors may occur despite best attempts at proofreading.   Final Clinical Impression(s) / ED Diagnoses Final diagnoses:  Low hemoglobin    Rx / DC Orders ED Discharge Orders          Ordered    Increase activity slowly        01/04/22 0952    Discharge instructions       Comments: Follow with Primary MD Pcp, No in 7 days   Get CBC, CMP, 2 view Chest X ray -  checked next visit within 1 week by Primary MD SNF MD   Activity: As tolerated with Full fall precautions use walker/cane & assistance as needed  Disposition SNF  Diet: - Renal, 1200 mL Fluid  Diet effective now         Special  Instructions: If you have smoked or chewed Tobacco  in the last 2 yrs  please stop smoking, stop any regular Alcohol  and or any Recreational drug use.  On your next visit with your primary care physician please Get Medicines reviewed and adjusted.  Please request your Prim.MD to go over all Hospital Tests and Procedure/Radiological results at the follow up, please get all Hospital records sent to your Prim MD by signing hospital release before you go home.  If you experience worsening of your admission symptoms, develop shortness of breath, life threatening emergency, suicidal or homicidal thoughts you must seek medical attention immediately by calling 911 or calling your MD immediately  if symptoms less severe.  You Must read complete instructions/literature along with all the possible adverse reactions/side effects for all the Medicines you take and that have been prescribed to you. Take any new Medicines after you have completely understood and accpet all the possible adverse reactions/side effects.   01/04/22 0952    labetalol (NORMODYNE) 300 MG tablet  2 times daily        01/01/22 1101    amLODipine (NORVASC) 10 MG tablet  Daily        01/01/22 1101    feeding supplement (ENSURE ENLIVE / ENSURE PLUS) LIQD  2 times daily between meals        01/01/22 1101    sevelamer carbonate (RENVELA) 800 MG tablet  3 times daily with meals        01/01/22 1101    multivitamin (RENA-VIT) TABS tablet  Daily at bedtime        01/01/22 1101    hydrALAZINE (APRESOLINE) 50 MG tablet  Every 8 hours        01/01/22 1101    albuterol (PROVENTIL) (2.5 MG/3ML) 0.083% nebulizer solution  Every 6 hours PRN        01/01/22 1101    oxyCODONE-acetaminophen (PERCOCET) 5-325 MG tablet  Every 4 hours PRN,   Status:  Discontinued        01/01/22 1101    Increase activity slowly        01/01/22 1101    Diet - low sodium heart healthy  Status:  Canceled        01/01/22 1101    oxyCODONE-acetaminophen (PERCOCET)  5-325 MG tablet  Every 8 hours PRN        01/01/22 1409              Janeece Fitting, PA-C 01/16/22 1538    Drenda Freeze, MD 01/19/22 1550

## 2021-12-24 NOTE — Progress Notes (Signed)
Informed of patient in ER. Sent from our office for transfusion and the need to start HD. Had seen Dr. Royce Macadamia earlier today, discussed case with her. Recommendations: -1u PRBC, would recommend lasix 80mg  IV x 1 post transfusion -Please consult IR once patient has been admitted for tunneled dialysis catheter placement. Keep NPO after midnight -Full consult to follow -Please call with any questions/concerns  Gean Quint, MD [facility]

## 2021-12-24 NOTE — Assessment & Plan Note (Addendum)
Monitor for any sign of withdrowal, have not been drinking while at the facility

## 2021-12-24 NOTE — ED Notes (Signed)
Floor contacted to initate purple man process. Bed assigned. No purpleman yet

## 2021-12-24 NOTE — ED Notes (Signed)
ABO Confirmation collected and tubed to blood bank for PRBC infusion

## 2021-12-24 NOTE — ED Notes (Signed)
HGB 6.9. called from lab [facility]. MD Darl Householder notified

## 2021-12-24 NOTE — Assessment & Plan Note (Signed)
Obtain KUB 

## 2021-12-24 NOTE — Assessment & Plan Note (Signed)
Liver function wnl  Check prealbumin Check UA for protein Nephrotic syndrome?

## 2021-12-24 NOTE — Assessment & Plan Note (Signed)
In April pt was prescribed Asprin and Plavix Given precipitous drop in HG and need for proceedure will hold Plavix for now

## 2021-12-24 NOTE — Subjective & Objective (Addendum)
Came in  Had a stroke 1 m ago and was found to have renal failure Today had follow up appointment His Cr went up from 3 to 84 now, of note 1 year ago it was 1 Also noted to have anemia Hypertensive with bp up to 220 Hx of poorly controlled HTN Endorses leg edema but no SOB no CP

## 2021-12-24 NOTE — Assessment & Plan Note (Signed)
-   Spoke about importance of quitting spent 5 minutes discussing options for treatment, prior attempts at quitting, and dangers of smoking ? -At this point patient is    interested in quitting ? - order nicotine patch  ? - nursing tobacco cessation protocol ? ?

## 2021-12-25 ENCOUNTER — Inpatient Hospital Stay (HOSPITAL_COMMUNITY): Payer: Medicaid Other

## 2021-12-25 ENCOUNTER — Encounter (HOSPITAL_COMMUNITY): Payer: Self-pay | Admitting: Internal Medicine

## 2021-12-25 DIAGNOSIS — Z720 Tobacco use: Secondary | ICD-10-CM | POA: Diagnosis not present

## 2021-12-25 DIAGNOSIS — R600 Localized edema: Secondary | ICD-10-CM | POA: Diagnosis not present

## 2021-12-25 DIAGNOSIS — N179 Acute kidney failure, unspecified: Secondary | ICD-10-CM | POA: Diagnosis not present

## 2021-12-25 DIAGNOSIS — D649 Anemia, unspecified: Secondary | ICD-10-CM | POA: Diagnosis not present

## 2021-12-25 HISTORY — PX: IR US GUIDE VASC ACCESS RIGHT: IMG2390

## 2021-12-25 HISTORY — PX: IR FLUORO GUIDE CV LINE RIGHT: IMG2283

## 2021-12-25 LAB — LIPID PANEL
Cholesterol: 140 mg/dL (ref 0–200)
HDL: 30 mg/dL — ABNORMAL LOW (ref >40)
LDL Cholesterol: 85 mg/dL (ref 0–99)
Total CHOL/HDL Ratio: 4.7 RATIO
Triglycerides: 124 mg/dL (ref <150)
VLDL: 25 mg/dL (ref 0–40)

## 2021-12-25 LAB — CBC
HCT: 20.5 % — ABNORMAL LOW (ref 39.0–52.0)
HCT: 20.6 % — ABNORMAL LOW (ref 39.0–52.0)
HCT: 31.1 % — ABNORMAL LOW (ref 39.0–52.0)
Hemoglobin: 6.6 g/dL — CL (ref 13.0–17.0)
Hemoglobin: 6.8 g/dL — CL (ref 13.0–17.0)
Hemoglobin: 11 g/dL — ABNORMAL LOW (ref 13.0–17.0)
MCH: 28.9 pg (ref 26.0–34.0)
MCH: 30 pg (ref 26.0–34.0)
MCH: 29.9 pg (ref 26.0–34.0)
MCHC: 32.2 g/dL (ref 30.0–36.0)
MCHC: 33 g/dL (ref 30.0–36.0)
MCHC: 35.4 g/dL (ref 30.0–36.0)
MCV: 89.9 fL (ref 80.0–100.0)
MCV: 90.7 fL (ref 80.0–100.0)
MCV: 84.5 fL (ref 80.0–100.0)
Platelets: 124 10*3/uL — ABNORMAL LOW (ref 150–400)
Platelets: 121 10*3/uL — ABNORMAL LOW (ref 150–400)
Platelets: 117 10*3/uL — ABNORMAL LOW (ref 150–400)
RBC: 2.28 MIL/uL — ABNORMAL LOW (ref 4.22–5.81)
RBC: 2.27 MIL/uL — ABNORMAL LOW (ref 4.22–5.81)
RBC: 3.68 MIL/uL — ABNORMAL LOW (ref 4.22–5.81)
RDW: 15.8 % — ABNORMAL HIGH (ref 11.5–15.5)
RDW: 16.1 % — ABNORMAL HIGH (ref 11.5–15.5)
RDW: 17.2 % — ABNORMAL HIGH (ref 11.5–15.5)
WBC: 8.6 10*3/uL (ref 4.0–10.5)
WBC: 8.5 10*3/uL (ref 4.0–10.5)
WBC: 8.1 10*3/uL (ref 4.0–10.5)
nRBC: 0 % (ref 0.0–0.2)
nRBC: 0 % (ref 0.0–0.2)
nRBC: 0 % (ref 0.0–0.2)

## 2021-12-25 LAB — BLOOD GAS, VENOUS
Acid-base deficit: 11.3 mmol/L — ABNORMAL HIGH (ref 0.0–2.0)
Bicarbonate: 12.7 mmol/L — ABNORMAL LOW (ref 20.0–28.0)
O2 Saturation: 97 %
Patient temperature: 37
pCO2, Ven: 24 mmHg — ABNORMAL LOW (ref 44–60)
pH, Ven: 7.33 (ref 7.25–7.43)
pO2, Ven: 74 mmHg — ABNORMAL HIGH (ref 32–45)

## 2021-12-25 LAB — TSH: TSH: 2.965 u[IU]/mL (ref 0.350–4.500)

## 2021-12-25 LAB — PHOSPHORUS: Phosphorus: 8.3 mg/dL — ABNORMAL HIGH (ref 2.5–4.6)

## 2021-12-25 LAB — MAGNESIUM: Magnesium: 1.8 mg/dL (ref 1.7–2.4)

## 2021-12-25 LAB — TYPE AND SCREEN

## 2021-12-25 LAB — HEPATITIS B SURFACE ANTIBODY,QUALITATIVE: Hep B S Ab: REACTIVE — AB

## 2021-12-25 LAB — HEPATITIS B SURFACE ANTIGEN: Hepatitis B Surface Ag: NONREACTIVE

## 2021-12-25 LAB — CK: Total CK: 119 U/L (ref 49–397)

## 2021-12-25 LAB — PREALBUMIN: Prealbumin: 17.1 mg/dL — ABNORMAL LOW (ref 18–38)

## 2021-12-25 LAB — OSMOLALITY: Osmolality: 331 mOsm/kg (ref 275–295)

## 2021-12-25 LAB — PREPARE RBC (CROSSMATCH)

## 2021-12-25 MED ORDER — LIDOCAINE HCL (PF) 1 % IJ SOLN
5.0000 mL | INTRAMUSCULAR | Status: DC | PRN
  Filled 2021-12-25: qty 5

## 2021-12-25 MED ORDER — CEFAZOLIN SODIUM-DEXTROSE 2-4 GM/100ML-% IV SOLN
INTRAVENOUS | Status: AC
  Administered 2021-12-25: 2 g via INTRAVENOUS
  Filled 2021-12-25: qty 100

## 2021-12-25 MED ORDER — ACETAMINOPHEN 325 MG PO TABS
650.0000 mg | ORAL_TABLET | Freq: Once | ORAL | Status: AC
  Administered 2021-12-25: 650 mg via ORAL
  Filled 2021-12-25: qty 2

## 2021-12-25 MED ORDER — CEFAZOLIN SODIUM-DEXTROSE 2-4 GM/100ML-% IV SOLN: 2.0000 g | INTRAVENOUS | Status: AC

## 2021-12-25 MED ORDER — SODIUM CHLORIDE 0.9% IV SOLUTION: Freq: Once | INTRAVENOUS | Status: AC

## 2021-12-25 MED ORDER — DIPHENHYDRAMINE HCL 50 MG/ML IJ SOLN
25.0000 mg | Freq: Once | INTRAMUSCULAR | Status: AC
  Administered 2021-12-25: 25 mg via INTRAVENOUS
  Filled 2021-12-25: qty 1

## 2021-12-25 MED ORDER — HEPARIN SODIUM (PORCINE) 1000 UNIT/ML IJ SOLN
INTRAMUSCULAR | Status: AC | PRN
  Administered 2021-12-25: 3200 [IU] via INTRAVENOUS

## 2021-12-25 MED ORDER — LIDOCAINE-PRILOCAINE 2.5-2.5 % EX CREA
1.0000 "application " | TOPICAL_CREAM | CUTANEOUS | Status: DC | PRN
  Filled 2021-12-25: qty 5

## 2021-12-25 MED ORDER — ENSURE ENLIVE PO LIQD
237.0000 mL | Freq: Two times a day (BID) | ORAL | Status: DC
  Administered 2021-12-26 – 2022-01-04 (×16): 237 mL via ORAL
  Filled 2021-12-25 (×2): qty 237

## 2021-12-25 MED ORDER — FENTANYL CITRATE (PF) 100 MCG/2ML IJ SOLN
INTRAMUSCULAR | Status: AC
  Filled 2021-12-25: qty 2

## 2021-12-25 MED ORDER — PENTAFLUOROPROP-TETRAFLUOROETH EX AERO: 1.0000 "application " | INHALATION_SPRAY | CUTANEOUS | Status: DC | PRN

## 2021-12-25 MED ORDER — MIDAZOLAM HCL 2 MG/2ML IJ SOLN
INTRAMUSCULAR | Status: AC
  Filled 2021-12-25: qty 2

## 2021-12-25 MED ORDER — ALTEPLASE 2 MG IJ SOLR
2.0000 mg | Freq: Once | INTRAMUSCULAR | Status: DC | PRN
Start: 2021-12-25 — End: 2021-12-26

## 2021-12-25 MED ORDER — MIDAZOLAM HCL 2 MG/2ML IJ SOLN
INTRAMUSCULAR | Status: AC | PRN
  Administered 2021-12-25: 1 mg via INTRAVENOUS

## 2021-12-25 MED ORDER — AMLODIPINE BESYLATE 5 MG PO TABS
5.0000 mg | ORAL_TABLET | Freq: Every day | ORAL | Status: DC
  Administered 2021-12-25: 5 mg via ORAL
  Filled 2021-12-25 (×3): qty 1

## 2021-12-25 MED ORDER — HYDROMORPHONE HCL 1 MG/ML IJ SOLN
1.0000 mg | INTRAMUSCULAR | Status: DC | PRN
  Administered 2021-12-25 – 2021-12-31 (×5): 1 mg via INTRAVENOUS
  Filled 2021-12-25 (×7): qty 1

## 2021-12-25 MED ORDER — LIDOCAINE-EPINEPHRINE 1 %-1:100000 IJ SOLN
INTRAMUSCULAR | Status: AC | PRN
  Administered 2021-12-25: 15 mL via INTRADERMAL

## 2021-12-25 MED ORDER — HEPARIN SODIUM (PORCINE) 1000 UNIT/ML DIALYSIS
1000.0000 [IU] | INTRAMUSCULAR | Status: DC | PRN
  Filled 2021-12-25: qty 1

## 2021-12-25 MED ORDER — FENTANYL CITRATE (PF) 100 MCG/2ML IJ SOLN
INTRAMUSCULAR | Status: AC | PRN
  Administered 2021-12-25: 50 ug via INTRAVENOUS

## 2021-12-25 MED ORDER — RENA-VITE PO TABS
1.0000 | ORAL_TABLET | Freq: Every day | ORAL | Status: DC
  Administered 2021-12-25 – 2022-01-03 (×10): 1 via ORAL
  Filled 2021-12-25 (×10): qty 1

## 2021-12-25 MED ORDER — LIDOCAINE-EPINEPHRINE 1 %-1:100000 IJ SOLN
INTRAMUSCULAR | Status: AC
  Filled 2021-12-25: qty 1

## 2021-12-25 MED ORDER — SODIUM CHLORIDE 0.9 % IV SOLN
INTRAVENOUS | Status: AC | PRN
Start: 2021-12-25 — End: 2021-12-25
  Administered 2021-12-25: 10 mL/h via INTRAVENOUS

## 2021-12-25 MED ORDER — CHLORHEXIDINE GLUCONATE CLOTH 2 % EX PADS
6.0000 | MEDICATED_PAD | Freq: Every day | CUTANEOUS | Status: DC
  Administered 2021-12-26 – 2022-01-04 (×10): 6 via TOPICAL

## 2021-12-25 MED ORDER — SODIUM CHLORIDE 0.9 % IV SOLN: 100.0000 mL | INTRAVENOUS | Status: DC | PRN

## 2021-12-25 MED ORDER — HYDRALAZINE HCL 20 MG/ML IJ SOLN
10.0000 mg | Freq: Four times a day (QID) | INTRAMUSCULAR | Status: DC | PRN
  Administered 2021-12-25 – 2021-12-30 (×5): 10 mg via INTRAVENOUS
  Filled 2021-12-25 (×5): qty 1

## 2021-12-25 MED ORDER — HEPARIN SODIUM (PORCINE) 1000 UNIT/ML IJ SOLN
INTRAMUSCULAR | Status: AC
  Filled 2021-12-25: qty 10

## 2021-12-25 NOTE — Progress Notes (Signed)
Started having left lower lateral sharp constant abdominal pain during the night. This pain is 13/10. Area is extremely tender. Abdomen does not look distended. Last BM 2 days ago as per patient. Talked to his nurse on 5W who was unaware of this. [facility] PA informed of this pain and examined patient for procedure. Dr Mir also spoke to patient and aware of pain. Sherlie Ban will notify Dr Sloan Leiter re this pain and patient is to be evaluated by him prior to procedure.

## 2021-12-25 NOTE — Progress Notes (Signed)
Lower extremity venous attempted. Patient in IR. Will attempt again as schedule and patient availability permits.   Darlin Coco, RDMS, RVT

## 2021-12-25 NOTE — Plan of Care (Signed)

## 2021-12-25 NOTE — Progress Notes (Addendum)
PROGRESS NOTE        PATIENT DETAILS Name: Cody Macdonald Age: 61 y.o. Sex: male Date of Birth: October 05, 1960 Admit Date: 12/24/2021 Admitting Physician Toy Baker, MD PCP:Pcp, No  Brief Summary: Patient is a 61 y.o.  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  See below for further details.  Significant events: 5/25>> referred to ED from [facility]-progressive renal failure-concern for ESRD, severe anemia.  Admit to [facility].  Significant studies: 5/25>> CXR: Small right pleural effusion-right basilar subsegmental atelectasis  Significant microbiology data:   Procedures:   Consults: Nephrology, IR  Subjective: Lying comfortably in bed-denies any chest pain or shortness of breath.  Objective: Vitals: Blood pressure (!) 205/96, pulse 75, temperature 98.2 F (36.8 C), temperature source Oral, resp. rate 19, height 5\' 7"  (1.702 m), weight 58.1 kg, SpO2 100 %.   Exam: Gen Exam:Alert awake-not in any distress HEENT:atraumatic, normocephalic Chest: B/L clear to auscultation anteriorly CVS:S1S2 regular Abdomen:soft non tender, non distended Extremities:no edema Neurology: Left-sided hemiplegia Skin: no rash  Pertinent Labs/Radiology:    Latest Ref Rng & Units 12/25/2021    7:22 AM 12/25/2021    2:31 AM 12/24/2021    6:46 PM  CBC  WBC 4.0 - 10.5 K/uL 8.5   8.6   9.0    Hemoglobin 13.0 - 17.0 g/dL 6.8   6.6   6.9    Hematocrit 39.0 - 52.0 % 20.6   20.5   21.0    Platelets 150 - 400 K/uL 121   124   139      Lab Results  Component Value Date   NA 142 12/24/2021   K 4.6 12/24/2021   CL 117 (H) 12/24/2021   CO2 12 (L) 12/24/2021     Assessment/Plan: AKI on CKD stage IV-likely progression to ESRD: Nephrology following with plans to place tunneled dialysis catheter and initiate HD.  Await further  recommendations from nephrology.  Normocytic anemia: Likely due to CKD-no evidence of blood loss.  Hemoglobin stable significantly low-plan is to transfuse second unit of PRBC this morning.  Aranesp/IV iron deferred to nephrology.  Follow CBC and transfuse accordingly.   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  Per patient-he was discharged from [facility] to SNF-where he still resides.  He has not ambulated and is mostly wheelchair-bound.  HTN: BP elevated-continue labetalol-add Norvasc.  Follow and optimize.  Should improve post HD.  HLD: On statin.  Right> left lower extremity edema: Awaiting Doppler studies.  History of alcohol use: Claims no further alcohol use since hospitalization in April for CVA.  Resides at [facility].  Tobacco abuse: Counseled-on transdermal nicotine  Addendum: Informed by IR PA-C that patient complaining of left upper quadrant pain while in radiology.  Reevaluated patient-apparently pain started en-route to radiology-he apparently has been having recurrent pain for 2-3 days.  Abdominal exam-soft-some mild tenderness in the left upper quadrant area.  Should be okay to proceed with [facility] placement-once he comes up to the floor-we can contemplate further work-up with CT imaging.  KUB x-ray on 5/25 was unremarkable.  BMI Estimated body mass index is 20.05 kg/m as calculated from the following:   Height as of this encounter: 5'  7" (1.702 m).   Weight as of this encounter: 58.1 kg.   Code status:   Code Status: Not on file   DVT Prophylaxis:SCD's for now-start SQ heparin/Lovenox once all procedures are complete.   Family Communication: None at bedside   Disposition Plan: Status is: Inpatient Remains inpatient appropriate because: Likely progression to ESRD-will need initiation of HD.   Planned Discharge Destination:Skilled nursing  facility   Diet: Diet Order             Diet NPO time specified  Diet effective now                     Antimicrobial agents: Anti-infectives (From admission, onward)    Start     Dose/Rate Route Frequency Ordered Stop   12/25/21 0828  ceFAZolin (ANCEF) 2-4 GM/100ML-% IVPB       Note to Pharmacy: [name]: cabinet override      12/25/21 0828 12/25/21 2044        MEDICATIONS: Scheduled Meds:  sodium chloride   Intravenous Once   sodium chloride   Intravenous Once   sodium chloride   Intravenous Once   acetaminophen  650 mg Oral Once   amLODipine  5 mg Oral Daily   atorvastatin  80 mg Oral Daily   diphenhydrAMINE  25 mg Intravenous Once   fentaNYL       heparin sodium (porcine)       labetalol  100 mg Oral BID   lidocaine-EPINEPHrine       melatonin  3 mg Oral QHS   midazolam       pantoprazole  40 mg Oral Q1200   Continuous Infusions:  ceFAZolin     PRN Meds:.hydrALAZINE   I have personally reviewed following labs and imaging studies  LABORATORY DATA: CBC: Recent Labs  Lab 12/24/21 1846 12/25/21 0231 12/25/21 0722  WBC 9.0 8.6 8.5  NEUTROABS 5.8  --   --   HGB 6.9* 6.6* 6.8*  HCT 21.0* 20.5* 20.6*  MCV 94.2 89.9 90.7  PLT 139* 124* 121*    Basic Metabolic Panel: Recent Labs  Lab 12/24/21 1846 12/25/21 0231  NA 142  --   K 4.6  --   CL 117*  --   CO2 12*  --   GLUCOSE 99  --   BUN 100*  --   CREATININE 13.84*  --   CALCIUM 8.1*  --   MG  --  1.8  PHOS  --  8.3*    GFR: Estimated Creatinine Clearance: 4.7 mL/min (A) (by C-G formula based on SCr of 13.84 mg/dL (H)).  Liver Function Tests: Recent Labs  Lab 12/24/21 1846  AST 9*  ALT 8  ALKPHOS 90  BILITOT 0.5  PROT 6.8  ALBUMIN 2.4*   No results for input(s): LIPASE, AMYLASE in the last 168 hours. No results for input(s): AMMONIA in the last 168 hours.  Coagulation Profile: Recent Labs  Lab 12/24/21 1846  INR 1.1    Cardiac Enzymes: Recent Labs  Lab  12/25/21 0231  CKTOTAL 119    BNP (last 3 results) No results for input(s): PROBNP in the last 8760 hours.  Lipid Profile: Recent Labs    12/25/21 0231  CHOL 140  HDL 30*  LDLCALC 85  TRIG 124  CHOLHDL 4.7    Thyroid Function Tests: Recent Labs    12/25/21 0231  TSH 2.965    Anemia Panel: Recent Labs    12/24/21 1840 12/24/21  1846  VITAMINB12  --  2,631*  FOLATE 15.7  --   FERRITIN  --  459*  TIBC  --  241*  IRON  --  69  RETICCTPCT 3.4*  --     Urine analysis: No results found for: COLORURINE, APPEARANCEUR, LABSPEC, PHURINE, GLUCOSEU, HGBUR, BILIRUBINUR, KETONESUR, PROTEINUR, UROBILINOGEN, NITRITE, LEUKOCYTESUR  Sepsis Labs: Lactic Acid, Venous No results found for: LATICACIDVEN  MICROBIOLOGY: No results found for this or any previous visit (from the past 240 hour(s)).  RADIOLOGY STUDIES/RESULTS: DG Abd 1 View  Result Date: 12/24/2021 CLINICAL DATA:  Abdominal distension EXAM: ABDOMEN - 1 VIEW COMPARISON:  None Available. FINDINGS: The bowel gas pattern is normal. No radio-opaque calculi or other significant radiographic abnormality are seen. IMPRESSION: Negative. Electronically Signed   By: Rolm Baptise M.D.   On: 12/24/2021 21:37   DG Chest Port 1 View  Result Date: 12/24/2021 CLINICAL DATA:  Shortness of breath. EXAM: PORTABLE CHEST 1 VIEW COMPARISON:  None Available. FINDINGS: Cardiac silhouette and mediastinal contours are within normal limits. There is a small right pleural effusion with associated basilar opacity. The left lung is clear. No pneumothorax. No acute skeletal abnormality. Surgical clips overlie the gastroesophageal junction. IMPRESSION: Small right pleural effusion with associated right basilar likely subsegmental atelectasis. Electronically Signed   By: Yvonne Kendall M.D.   On: 12/24/2021 18:52     LOS: 1 day   Oren Binet, MD  [facility]    To contact the attending provider between 7A-7P or the covering provider  during after hours 7P-7A, please log into the web site [email or web address] and access using universal [place] password for that web site. If you do not have the password, please call the hospital operator.  12/25/2021, 8:38 AM

## 2021-12-25 NOTE — Consult Note (Signed)
Reason for Consult: Progressive chronic kidney disease-now likely end-stage renal disease Referring Physician: Royanne Foots, MD [facility])  HPI:  61 year old man with past medical history significant for hypertension, prior history of CVA, dyslipidemia, tobacco abuse and progressive chronic kidney disease previously under the care of [facility] who was seen yesterday at [facility] by Dr. Royce Macadamia as an initial visit for transfer of care.  He reported some dysgeusia and intermittent left upper quadrant pain and was found to have significant rise of his creatinine prompting direction to the emergency room for admission/initiation of dialysis.  He denies any obstructive or irritative urinary symptoms and does not have any diarrhea, nausea or vomiting in addition to the dysgeusia.  He denies any chest pain or shortness of breath.  He reports bilateral leg swelling with the right side being affected more than the left prior to admission.  Interventional radiology consulted for assistance with dialysis catheter placement possibly today.  Past Medical History:  Diagnosis Date   Chronic kidney disease    Hypertension    Stroke [facility])     History reviewed. No pertinent surgical history.  History reviewed. No pertinent family history.  Social History:  reports that he has been smoking cigarettes. He has been smoking an average of .25 packs per day. He has never used smokeless tobacco. He reports current alcohol use of about 1.0 standard drink per week. He reports current drug use. Drug: Marijuana.  Allergies: No Known Allergies  Medications: I have reviewed the patient's current medications. Scheduled:  sodium chloride   Intravenous Once   sodium chloride   Intravenous Once   sodium chloride   Intravenous Once   acetaminophen  650 mg Oral Once   amLODipine  5 mg Oral Daily   atorvastatin  80 mg Oral Daily   diphenhydrAMINE  25 mg Intravenous Once   fentaNYL       heparin  sodium (porcine)       labetalol  100 mg Oral BID   lidocaine-EPINEPHrine       melatonin  3 mg Oral QHS   midazolam       pantoprazole  40 mg Oral Q1200    Results for orders placed or performed during the hospital encounter of 12/24/21 (from the past 48 hour(s))  Prepare RBC (crossmatch)     Status: None   Collection Time: 12/24/21  6:31 PM  Result Value Ref Range   Order Confirmation      ORDER PROCESSED BY BLOOD BANK Performed at [facility], [address] [place], [place] [address]   Folate     Status: None   Collection Time: 12/24/21  6:40 PM  Result Value Ref Range   Folate 15.7 >5.9 ng/mL    Comment: Performed at [facility], [place] [address], [place], [place] [address]  Reticulocytes     Status: Abnormal   Collection Time: 12/24/21  6:40 PM  Result Value Ref Range   Retic Ct Pct 3.4 (H) 0.4 - 3.1 %   RBC. 2.26 (L) 4.22 - 5.81 MIL/uL   Retic Count, Absolute 77.7 19.0 - 186.0 K/uL   Immature Retic Fract 12.2 2.3 - 15.9 %    Comment: Performed at [facility] [address] [place], [place] [address]  CBC with Differential     Status: Abnormal   Collection Time: 12/24/21  6:46 PM  Result Value Ref Range   WBC 9.0 4.0 - 10.5 K/uL   RBC 2.23 (  L) 4.22 - 5.81 MIL/uL   Hemoglobin 6.9 (LL) 13.0 - 17.0 g/dL    Comment: REPEATED TO VERIFY THIS CRITICAL RESULT HAS VERIFIED AND BEEN CALLED TO R FRANCIS RN BY REBECCA VERAAR ON 05 25 2023 AT 1918, AND HAS BEEN READ BACK.     HCT 21.0 (L) 39.0 - 52.0 %   MCV 94.2 80.0 - 100.0 fL   MCH 30.9 26.0 - 34.0 pg   MCHC 32.9 30.0 - 36.0 g/dL   RDW 15.9 (H) 11.5 - 15.5 %   Platelets 139 (L) 150 - 400 K/uL   nRBC 0.0 0.0 - 0.2 %   Neutrophils Relative % 65 %   Neutro Abs 5.8 1.7 - 7.7 K/uL   Lymphocytes Relative 21 %   Lymphs Abs 1.9 0.7 - 4.0 K/uL   Monocytes Relative 8 %   Monocytes Absolute 0.8 0.1 - 1.0 K/uL   Eosinophils Relative 6 %   Eosinophils Absolute 0.5 0.0 - 0.5 K/uL   Basophils Relative 0 %    Basophils Absolute 0.0 0.0 - 0.1 K/uL   Immature Granulocytes 0 %   Abs Immature Granulocytes 0.04 0.00 - 0.07 K/uL    Comment: Performed at [facility] [address] [place], [place] [address]  Comprehensive metabolic panel     Status: Abnormal   Collection Time: 12/24/21  6:46 PM  Result Value Ref Range   Sodium 142 135 - 145 mmol/L   Potassium 4.6 3.5 - 5.1 mmol/L   Chloride 117 (H) 98 - 111 mmol/L   CO2 12 (L) 22 - 32 mmol/L   Glucose, Bld 99 70 - 99 mg/dL    Comment: Glucose reference range applies only to samples taken after fasting for at least 8 hours.   BUN 100 (H) 6 - 20 mg/dL   Creatinine, Ser 13.84 (H) 0.61 - 1.24 mg/dL   Calcium 8.1 (L) 8.9 - 10.3 mg/dL   Total Protein 6.8 6.5 - 8.1 g/dL   Albumin 2.4 (L) 3.5 - 5.0 g/dL   AST 9 (L) 15 - 41 U/L   ALT 8 0 - 44 U/L   Alkaline Phosphatase 90 38 - 126 U/L   Total Bilirubin 0.5 0.3 - 1.2 mg/dL   GFR, Estimated 4 (L) >60 mL/min    Comment: (NOTE) Calculated using the CKD-EPI Creatinine Equation (2021)    Anion gap 13 5 - 15    Comment: Performed at [facility] [address], [place], [place] [address]  Protime-INR     Status: None   Collection Time: 12/24/21  6:46 PM  Result Value Ref Range   Prothrombin Time 14.2 11.4 - 15.2 seconds   INR 1.1 0.8 - 1.2    Comment: (NOTE) INR goal varies based on device and disease states. Performed at [facility], [place] [address] [place], [place] [address]   Vitamin B12     Status: Abnormal   Collection Time: 12/24/21  6:46 PM  Result Value Ref Range   Vitamin B-12 2,631 (H) 180 - 914 pg/mL    Comment: RESULT CONFIRMED BY AUTOMATED DILUTION (NOTE) This assay is not validated for testing neonatal or myeloproliferative syndrome specimens for Vitamin B12 levels. Performed at [facility], [place] [address], [place], [place] [address]   Iron and TIBC     Status: Abnormal   Collection Time: 12/24/21  6:46 PM  Result Value Ref Range   Iron 69 45 - 182  ug/dL  TIBC 241 (L) 250 - 450 ug/dL   Saturation Ratios 29 17.9 - 39.5 %   UIBC 172 ug/dL    Comment: Performed at [facility], [place] [address] [place], [place] [address]  Ferritin     Status: Abnormal   Collection Time: 12/24/21  6:46 PM  Result Value Ref Range   Ferritin 459 (H) 24 - 336 ng/mL    Comment: Performed at [facility], [place] [address], [place], [place] [address]  Type and screen [place]     Status: None (Preliminary result)   Collection Time: 12/24/21  6:46 PM  Result Value Ref Range   ABO/RH(D) O POS    Antibody Screen NEG    Sample Expiration      12/27/2021,2359 Performed at [facility], [place] [address], [place], [place] [address]    Unit Number [id number]    Blood Component Type RED CELLS,LR    Unit division 00    Status of Unit ISSUED,FINAL    Transfusion Status OK TO TRANSFUSE    Crossmatch Result Compatible    Unit Number [id number]    Blood Component Type RBC, LR IRR    Unit division 00    Status of Unit ALLOCATED    Transfusion Status OK TO TRANSFUSE    Crossmatch Result Compatible   ABO/Rh     Status: None   Collection Time: 12/24/21  8:35 PM  Result Value Ref Range   ABO/RH(D)      O POS Performed at [facility], [place] [address], [place], [place] [address]   CK     Status: None   Collection Time: 12/25/21  2:31 AM  Result Value Ref Range   Total CK 119 49 - 397 U/L    Comment: Performed at [facility], [place] [address], [place], [place] [address]  Magnesium     Status: None   Collection Time: 12/25/21  2:31 AM  Result Value Ref Range   Magnesium 1.8 1.7 - 2.4 mg/dL    Comment: Performed at [facility] [address] [place], [place] [address]  Phosphorus     Status: Abnormal   Collection Time: 12/25/21  2:31 AM  Result Value Ref Range   Phosphorus 8.3 (H) 2.5 - 4.6 mg/dL    Comment: Performed at [facility] [address] [place], [place] [address]  Osmolality      Status: Abnormal   Collection Time: 12/25/21  2:31 AM  Result Value Ref Range   Osmolality 331 (HH) 275 - 295 mOsm/kg    Comment: REPEATED TO VERIFY CRITICAL RESULT CALLED TO, READ BACK BY AND VERIFIED WITH: DOWNS,D RN 12/25/2021 AT 7342 SKEEN,P Performed at [facility], [place] [address], [place],  [address]   TSH     Status: None   Collection Time: 12/25/21  2:31 AM  Result Value Ref Range   TSH 2.965 0.350 - 4.500 uIU/mL    Comment: Performed by a 3rd Generation assay with a functional sensitivity of <=0.01 uIU/mL. Performed at [facility], [place] [address], [place],  [address]   Blood gas, venous     Status: Abnormal   Collection Time: 12/25/21  2:31 AM  Result Value Ref Range   pH, Ven 7.33 7.25 - 7.43   pCO2, Ven 24 (L) 44 - 60 mmHg   pO2, Ven 74 (H) 32 - 45 mmHg   Bicarbonate  12.7 (L) 20.0 - 28.0 mmol/L   Acid-base deficit 11.3 (H) 0.0 - 2.0 mmol/L   O2 Saturation 97 %   Patient temperature 37.0     Comment: Performed at [facility], [place] [address] [place], [place] [address]  CBC     Status: Abnormal   Collection Time: 12/25/21  2:31 AM  Result Value Ref Range   WBC 8.6 4.0 - 10.5 K/uL   RBC 2.28 (L) 4.22 - 5.81 MIL/uL   Hemoglobin 6.6 (LL) 13.0 - 17.0 g/dL    Comment: CRITICAL VALUE NOTED.  VALUE IS CONSISTENT WITH PREVIOUSLY REPORTED AND CALLED VALUE. REPEATED TO VERIFY    HCT 20.5 (L) 39.0 - 52.0 %   MCV 89.9 80.0 - 100.0 fL   MCH 28.9 26.0 - 34.0 pg   MCHC 32.2 30.0 - 36.0 g/dL   RDW 15.8 (H) 11.5 - 15.5 %   Platelets 124 (L) 150 - 400 K/uL   nRBC 0.0 0.0 - 0.2 %    Comment: Performed at [facility] [address], [place], [place] [address]  Lipid panel     Status: Abnormal   Collection Time: 12/25/21  2:31 AM  Result Value Ref Range   Cholesterol 140 0 - 200 mg/dL   Triglycerides 124 <150 mg/dL   HDL 30 (L) >40 mg/dL   Total CHOL/HDL Ratio 4.7 RATIO   VLDL 25 0 - 40 mg/dL   LDL Cholesterol 85 0 - 99 mg/dL     Comment:        Total Cholesterol/HDL:CHD Risk Coronary Heart Disease Risk Table                     Men   Women  1/2 Average Risk   3.4   3.3  Average Risk       5.0   4.4  2 X Average Risk   9.6   7.1  3 X Average Risk  23.4   11.0        Use the calculated Patient Ratio above and the CHD Risk Table to determine the patient's CHD Risk.        ATP III CLASSIFICATION (LDL):  <100     mg/dL   Optimal  100-129  mg/dL   Near or Above                    Optimal  130-159  mg/dL   Borderline  160-189  mg/dL   High  >190     mg/dL   Very High Performed at [facility] [address] [place], [place] [address]   CBC     Status: Abnormal   Collection Time: 12/25/21  7:22 AM  Result Value Ref Range   WBC 8.5 4.0 - 10.5 K/uL   RBC 2.27 (L) 4.22 - 5.81 MIL/uL   Hemoglobin 6.8 (LL) 13.0 - 17.0 g/dL    Comment: CRITICAL VALUE NOTED.  VALUE IS CONSISTENT WITH PREVIOUSLY REPORTED AND CALLED VALUE. REPEATED TO VERIFY    HCT 20.6 (L) 39.0 - 52.0 %   MCV 90.7 80.0 - 100.0 fL   MCH 30.0 26.0 - 34.0 pg   MCHC 33.0 30.0 - 36.0 g/dL   RDW 16.1 (H) 11.5 - 15.5 %   Platelets 121 (L) 150 - 400 K/uL   nRBC 0.0 0.0 - 0.2 %    Comment: Performed at [facility] [address], [place], [place]  [address]  Prepare RBC (crossmatch)     Status: None   Collection Time: 12/25/21  7:26 AM  Result Value Ref Range   Order Confirmation      ORDER PROCESSED BY BLOOD BANK Performed at [facility], [address] [place], [place] [address]     DG Abd 1 View  Result Date: 12/24/2021 CLINICAL DATA:  Abdominal distension EXAM: ABDOMEN - 1 VIEW COMPARISON:  None Available. FINDINGS: The bowel gas pattern is normal. No radio-opaque calculi or other significant radiographic abnormality are seen. IMPRESSION: Negative. Electronically Signed   By: Rolm Baptise M.D.   On: 12/24/2021 21:37   DG Chest Port 1 View  Result Date: 12/24/2021 CLINICAL DATA:  Shortness of breath. EXAM: PORTABLE  CHEST 1 VIEW COMPARISON:  None Available. FINDINGS: Cardiac silhouette and mediastinal contours are within normal limits. There is a small right pleural effusion with associated basilar opacity. The left lung is clear. No pneumothorax. No acute skeletal abnormality. Surgical clips overlie the gastroesophageal junction. IMPRESSION: Small right pleural effusion with associated right basilar likely subsegmental atelectasis. Electronically Signed   By: Yvonne Kendall M.D.   On: 12/24/2021 18:52    Review of Systems  Constitutional:  Positive for appetite change and fatigue. Negative for fever.  HENT:  Negative for congestion, sore throat and trouble swallowing.   Eyes:  Negative for redness and visual disturbance.  Respiratory:  Negative for chest tightness and shortness of breath.   Gastrointestinal:  Positive for abdominal pain. Negative for diarrhea, nausea and vomiting.  Genitourinary:  Negative for dysuria, frequency and hematuria.  Musculoskeletal:  Positive for arthralgias and back pain.  Skin:  Negative for wound.  Neurological:  Positive for weakness. Negative for speech difficulty and headaches.  Blood pressure (!) 205/96, pulse 75, temperature 98.2 F (36.8 C), temperature source Oral, resp. rate 19, height 5\' 7"  (1.702 m), weight 58.1 kg, SpO2 100 %. Physical Exam Constitutional:      Appearance: Normal appearance. He is normal weight. He is ill-appearing.  HENT:     Head: Normocephalic and atraumatic.     Right Ear: External ear normal.     Left Ear: External ear normal.     Nose: Nose normal.     Mouth/Throat:     Mouth: Mucous membranes are dry.     Pharynx: Oropharynx is clear.  Eyes:     General: No scleral icterus.    Extraocular Movements: Extraocular movements intact.     Conjunctiva/sclera: Conjunctivae normal.  Cardiovascular:     Rate and Rhythm: Normal rate and regular rhythm.     Pulses: Normal pulses.     Heart sounds: Normal heart sounds.  Pulmonary:      Effort: Pulmonary effort is normal.     Breath sounds: Normal breath sounds. No wheezing or rales.  Abdominal:     General: Abdomen is flat.     Palpations: Abdomen is soft.     Tenderness: There is abdominal tenderness.     Comments: Left upper quadrant tenderness  Musculoskeletal:     Cervical back: Normal range of motion and neck supple.     Right lower leg: Edema present.     Left lower leg: Edema present.     Comments: Trace leg edema  Lymphadenopathy:     Cervical: No cervical adenopathy.  Neurological:     Mental Status: He is alert and oriented to person, place, and time.  Psychiatric:        Mood  and Affect: Mood normal.    Assessment/Plan: 1.  Acute kidney injury on chronic kidney disease-suspected evolution to ESRD: Suspected to have had progression of chronic kidney disease and with development of uremic symptoms, plan to start him on dialysis.  He was n.p.o. after midnight and has orders in place for [facility] placement by IR followed by hemodialysis later today and then again tomorrow. 2.  Symptomatic anemia: Unclear if he had any overt losses-patient unsure.  Status post PRBC transfusion with furosemide yesterday. 3.  History of CVA: Plavix currently on hold anticipating TDC placement.  Resume antiplatelet therapy when possible. 4.  Hypertension: Resume oral antihypertensive agents and monitor with ultrafiltration/hemodialysis. 5.  Chronic kidney disease-metabolic bone disease: Continue to monitor calcium and phosphorus levels to decide on need for initiating binder while on renal diet. 6.  Nutrition: Likely compromised by advancing chronic kidney disease and history of alcohol use.  Monitor on ONS.  Adalena Abdulla K. 12/25/2021, 8:37 AM

## 2021-12-25 NOTE — Procedures (Signed)
Interventional Radiology Procedure Note  Procedure: Tunneled HD catheter  Indication: Renal failure  Findings: Please refer to procedural dictation for full description.  Complications: None  EBL: < 10 mL  Tyresa Prindiville, MD [phone]   

## 2021-12-25 NOTE — Progress Notes (Signed)
Critical called from lab.  Serum Osmolality 331.  Physician notified.  No new orders at this time.

## 2021-12-25 NOTE — Consult Note (Signed)
Chief Complaint: Patient was seen in consultation today for renal failure  Referring Physician(s): Dr. Marianne Sofia  Supervising Physician: Mir, Sharen Heck  Patient Status: [facility] - In-pt  History of Present Illness: Cody Macdonald is a 61 y.o. male with history of HTN, stroke, CKD stage IV who was referred from his Nephrology for urgent initiation at of dialysis. He is in need of dialysis access. IR consulted for HD catheter placement.   Patient seen in IR this AM.  He complains of 13/10 abdominal pain, tender at rest and to the touch. Patient assessed by Dr. Sloan Leiter at bedside.  He is stable for procedure and abdominal pain to be monitored on unit.  Patient is agreeable to proceed.   Patient has been NPO this AM.   Past Medical History:  Diagnosis Date   Chronic kidney disease    Hypertension    Stroke [facility])     History reviewed. No pertinent surgical history.  Allergies: Patient has no known allergies.  Medications: Prior to Admission medications   Medication Sig Start Date End Date Taking? Authorizing Provider  aspirin EC 81 MG tablet Take 81 mg by mouth daily. Swallow whole.   Yes [provider]  atorvastatin (LIPITOR) 80 MG tablet Take 80 mg by mouth at bedtime. 12/22/21  Yes [provider]  clopidogrel (PLAVIX) 75 MG tablet Take 75 mg by mouth daily. 12/20/21  Yes [provider]  ergocalciferol (VITAMIN D2) 1.25 MG (50000 UT) capsule Take 50,000 Units by mouth every Tuesday.   Yes [provider]  labetalol (NORMODYNE) 100 MG tablet Take 100 mg by mouth 2 (two) times daily. 12/20/21  Yes [provider]  melatonin 3 MG TABS tablet Take 3 mg by mouth at bedtime as needed (sleep).   Yes [provider]  NIFEdipine (PROCARDIA) 10 MG capsule Take 10 mg by mouth daily. 12/20/21  Yes [provider]  pantoprazole (PROTONIX) 40 MG tablet Take 40 mg by mouth daily. 12/20/21  Yes [provider]      History reviewed. No pertinent family history.  Social History   Socioeconomic History   Marital status: Single    Spouse name: Not on file   Number of children: Not on file   Years of education: Not on file   Highest education level: Not on file  Occupational History   Not on file  Tobacco Use   Smoking status: Every Day    Packs/day: 0.25    Types: Cigarettes   Smokeless tobacco: Never  Substance and Sexual Activity   Alcohol use: Yes    Alcohol/week: 1.0 standard drink    Types: 1 Cans of beer per week   Drug use: Yes    Types: Marijuana   Sexual activity: Not on file  Other Topics Concern   Not on file  Social History Narrative   Not on file   Social Determinants of Health   Financial Resource Strain: Not on file  Food Insecurity: Not on file  Transportation Needs: Not on file  Physical Activity: Not on file  Stress: Not on file  Social Connections: Not on file     Review of Systems: A 12 point ROS discussed and pertinent positives are indicated in the HPI above.  All other systems are negative.  Review of Systems  Constitutional:  Negative for fatigue and fever.  Respiratory:  Negative for cough and shortness of breath.   Cardiovascular:  Negative for chest pain.  Gastrointestinal:  Positive for  abdominal pain. Negative for abdominal distention, nausea and vomiting.  Musculoskeletal:  Negative for back pain.  Psychiatric/Behavioral:  Negative for behavioral problems and confusion.    Vital Signs: BP (!) 220/111 (BP Location: Left Arm) Comment: Dr Mir aware of this BP and that he has been hypertensive  Pulse 76   Temp 98.2 F (36.8 C) (Oral)   Resp 19   Ht 5\' 7"  (1.702 m)   Wt 128 lb (58.1 kg)   SpO2 100%   BMI 20.05 kg/m   Physical Exam Vitals and nursing note reviewed.  Constitutional:      General: He is not in acute distress.    Appearance: He is not ill-appearing.  HENT:     Mouth/Throat:     Mouth: Mucous membranes are moist.      Pharynx: Oropharynx is clear.  Cardiovascular:     Rate and Rhythm: Normal rate and regular rhythm.  Pulmonary:     Effort: Pulmonary effort is normal.  Abdominal:     General: Abdomen is flat. There is no distension.     Tenderness: There is abdominal tenderness (left-sided).  Skin:    General: Skin is warm and dry.  Neurological:     General: No focal deficit present.     Mental Status: He is alert and oriented to person, place, and time. Mental status is at baseline.  Psychiatric:        Mood and Affect: Mood normal.        Behavior: Behavior normal.        Thought Content: Thought content normal.        Judgment: Judgment normal.     MD Evaluation Airway: WNL Heart: WNL Abdomen: WNL Chest/ Lungs: WNL ASA  Classification: 3 Mallampati/Airway Score: Two   Imaging: DG Abd 1 View  Result Date: 12/24/2021 CLINICAL DATA:  Abdominal distension EXAM: ABDOMEN - 1 VIEW COMPARISON:  None Available. FINDINGS: The bowel gas pattern is normal. No radio-opaque calculi or other significant radiographic abnormality are seen. IMPRESSION: Negative. Electronically Signed   By: Rolm Baptise M.D.   On: 12/24/2021 21:37   DG Chest Port 1 View  Result Date: 12/24/2021 CLINICAL DATA:  Shortness of breath. EXAM: PORTABLE CHEST 1 VIEW COMPARISON:  None Available. FINDINGS: Cardiac silhouette and mediastinal contours are within normal limits. There is a small right pleural effusion with associated basilar opacity. The left lung is clear. No pneumothorax. No acute skeletal abnormality. Surgical clips overlie the gastroesophageal junction. IMPRESSION: Small right pleural effusion with associated right basilar likely subsegmental atelectasis. Electronically Signed   By: Yvonne Kendall M.D.   On: 12/24/2021 18:52    Labs:  CBC: Recent Labs    12/24/21 1846 12/25/21 0231 12/25/21 0722  WBC 9.0 8.6 8.5  HGB 6.9* 6.6* 6.8*  HCT 21.0* 20.5* 20.6*  PLT 139* 124* 121*    COAGS: Recent Labs     12/24/21 1846  INR 1.1    BMP: Recent Labs    12/24/21 1846  NA 142  K 4.6  CL 117*  CO2 12*  GLUCOSE 99  BUN 100*  CALCIUM 8.1*  CREATININE 13.84*  GFRNONAA 4*    LIVER FUNCTION TESTS: Recent Labs    12/24/21 1846  BILITOT 0.5  AST 9*  ALT 8  ALKPHOS 90  PROT 6.8  ALBUMIN 2.4*    TUMOR MARKERS: No results for input(s): AFPTM, CEA, CA199, CHROMGRNA in the last 8760 hours.  Assessment and Plan: Renal failure Patient with acute  renal failure in the setting of chronic renal failure stage IV.  He is admitted for dialysis initiation.  Patient in need of HD access.   IR consulted for tunneled HD catheter placement.   Patient assessed in IR after complaining of abdominal pain.   Risks and benefits discussed with the patient including, but not limited to bleeding, infection, vascular injury, pneumothorax which may require chest tube placement, air embolism or even death  All of the patient's questions were answered, patient is agreeable to proceed. Consent signed and in chart.  Thank you for this interesting consult.  I greatly enjoyed meeting CHADEN DOOM and look forward to participating in their care.  A copy of this report was sent to the requesting provider on this date.  Electronically Signed: Docia Barrier, PA 12/25/2021, 9:44 AM   I spent a total of 40 Minutes    in face to face in clinical consultation, greater than 50% of which was counseling/coordinating care for stage IV chronic kidney disease.

## 2021-12-25 NOTE — Progress Notes (Signed)
Initial Nutrition Assessment  DOCUMENTATION CODES:   Non-severe (moderate) malnutrition in context of chronic illness  INTERVENTION:  - Liberalize diet from a renal to a regular diet to provide widest variety of menu options to enhance nutritional adequacy  - Ensure Enlive po BID, each supplement provides 350 kcal and 20 grams of protein.  - Renal MVI with minerals  - Consider adding phosphorus binder if phos remains elevated  NUTRITION DIAGNOSIS:   Moderate Malnutrition related to chronic illness (CKD, stroke) as evidenced by mild fat depletion, moderate muscle depletion.  GOAL:   Patient will meet greater than or equal to 90% of their needs  MONITOR:   PO intake, Supplement acceptance, Diet advancement, Labs, Weight trends, I & O's  REASON FOR ASSESSMENT:   Consult Assessment of nutrition requirement/status  ASSESSMENT:   Pt admitted from SNF with abnormal labs found to have acute renal failure superimposed on CKD stage 3b. PMH significant for HTN, stroke (April 2023), and CKD stage IV.  05/26: s/p tunnel HD catheter placement  Plans per Nephrology for HD today and tomorrow.   Pt provided limited history. States that he typically eats 3 meals per day but his intake has decreased within the past few months however did not quantify how much. Pt is agreeable to protein supplementation once diet resumes.   Noted pt to be mostly wheelchair bound. Pt states that his wt is similar to current wt and denies wt loss. There is limited documentation of recent weights. Current admit weight noted to be 58.1 kg.   Edema: deep pitting BLE  Given pt is malnourished and has increased nutritional needs given need for HD, he would benefit from a liberalized diet and addition of nutrition supplements. Will reassess  and adjust as appropriate throughout admission.   Medications: melatonin, PRN versed  Labs: BUN 100, Cr 13.84, phos 8.3  I/O's: +681 ml since admission  NUTRITION -  FOCUSED PHYSICAL EXAM:  Flowsheet Row Most Recent Value  Orbital Region Mild depletion  Upper Arm Region Severe depletion  Thoracic and Lumbar Region Mild depletion  Buccal Region Moderate depletion  Temple Region Mild depletion  Clavicle Bone Region Moderate depletion  Clavicle and Acromion Bone Region Moderate depletion  Scapular Bone Region Moderate depletion  Dorsal Hand No depletion  Patellar Region Moderate depletion  Anterior Thigh Region Moderate depletion  Posterior Calf Region Mild depletion  Edema (RD Assessment) None  Hair Reviewed  Eyes Reviewed  Mouth Reviewed  Skin Reviewed  Nails Reviewed      Diet Order:   Diet Order             Diet regular Room service appropriate? Yes; Fluid consistency: Thin; Fluid restriction: 1200 mL Fluid  Diet effective now                  EDUCATION NEEDS:   Not appropriate for education at this time  Skin:  Skin Assessment: Reviewed RN Assessment  Last BM:  unknown  Height:   Ht Readings from Last 1 Encounters:  12/24/21 5\' 7"  (1.702 m)    Weight:   Wt Readings from Last 1 Encounters:  12/24/21 58.1 kg   BMI:  Body mass index is 20.05 kg/m.  Estimated Nutritional Needs:   Kcal:  1800-2000  Protein:  90-105g  Fluid:  1L + UOP  Clayborne Dana, RDN, LDN Clinical Nutrition

## 2021-12-25 NOTE — Progress Notes (Addendum)
Requested to see pt for out-pt HD needs. Met with pt at bedside. Introduced self and explained role. Pt informed navigator that he has been at a [place] facility for about one month. Per CSW, pt resides at [facility] and facility agreeable to any HD clinic in [place]. Will make referral to [facility] admissions this afternoon. Will follow and assist.   Melven Sartorius Renal Navigator [phone]  Addendum at 4:45 pm: Received a call from [facility] admissions this afternoon to say that pt's insurance is going to prove to be a barrier.Pt will likely not receive financial clearance to receive treatment at a [facility] clinic. Explained that pt resides in a snf in [place] and there are only [facility] clinics in [place] area. Pt's case will have to be escalated on Tuesday to investigate possible options. Update provided to CSW and RN CM.

## 2021-12-25 NOTE — Sedation Documentation (Addendum)
Patient seen/ examined abdomen by Dr Sloan Leiter. He says to go ahead with procedure and then he will evaluate the abdominal pain with a CT scan after procedure

## 2021-12-25 NOTE — Progress Notes (Signed)
Inpatient Diabetes Program Recommendations  AACE/ADA: New Consensus Statement on Inpatient Glycemic Control (2015)  Target Ranges:  Prepandial:   less than 140 mg/dL      Peak postprandial:   less than 180 mg/dL (1-2 hours)      Critically ill patients:  140 - 180 mg/dL    Review of Glycemic Control  Latest Reference Range & Units Most Recent  BUN 6 - 20 mg/dL 100 (H) 12/24/21 18:46  (H): Data is abnormally high Diabetes history: No DM hx noted Outpatient Diabetes medications: none Current orders for Inpatient glycemic control: none  Inpatient Diabetes Program Recommendations:    Noted consult for "uncontrolled DM", however, no notation for diagnosis. Serum on presentation was 100 mg/dL and FBG was 99 mg/dL. Assuming CO2 related to ESRD. Could consider adding A1C?   Thanks, Bronson Curb, MSN, RNC-OB Diabetes Coordinator [phone] (8a-5p)

## 2021-12-25 NOTE — Progress Notes (Signed)
Back from getting tunneled dialysis catheter.  He has no abdominal pain at all.  Abdominal exam is soft and nontender.  Unclear why he has been having recurrent abdominal pain-we will [place] check a CT scan of his abdomen.

## 2021-12-25 NOTE — Sedation Documentation (Signed)
Started having left lower lateral sharp constant abdominal pain during the night. This pain is 13/10. Area is extremely tender. Abdomen does not look distended. Last BM 2 days ago as per patient. Talked to his nurse on 5W who was unaware of this. [facility] PA informed of this pain and examined patient for procedure. Dr Mir also spoke to patient and aware of pain. Sherlie Ban will notify Dr Sloan Leiter re this pain and patient is to be evaluated by him prior to procedure.

## 2021-12-26 ENCOUNTER — Inpatient Hospital Stay (HOSPITAL_COMMUNITY): Payer: Medicaid Other

## 2021-12-26 DIAGNOSIS — M7989 Other specified soft tissue disorders: Secondary | ICD-10-CM

## 2021-12-26 DIAGNOSIS — Z720 Tobacco use: Secondary | ICD-10-CM | POA: Diagnosis not present

## 2021-12-26 DIAGNOSIS — R600 Localized edema: Secondary | ICD-10-CM | POA: Diagnosis not present

## 2021-12-26 DIAGNOSIS — D649 Anemia, unspecified: Secondary | ICD-10-CM | POA: Diagnosis not present

## 2021-12-26 DIAGNOSIS — N179 Acute kidney failure, unspecified: Secondary | ICD-10-CM | POA: Diagnosis not present

## 2021-12-26 DIAGNOSIS — E440 Moderate protein-calorie malnutrition: Secondary | ICD-10-CM | POA: Insufficient documentation

## 2021-12-26 LAB — RENAL FUNCTION PANEL
Albumin: 2 g/dL — ABNORMAL LOW (ref 3.5–5.0)
Anion gap: 13 (ref 5–15)
BUN: 67 mg/dL — ABNORMAL HIGH (ref 6–20)
CO2: 18 mmol/L — ABNORMAL LOW (ref 22–32)
Calcium: 8.3 mg/dL — ABNORMAL LOW (ref 8.9–10.3)
Chloride: 107 mmol/L (ref 98–111)
Creatinine, Ser: 10.24 mg/dL — ABNORMAL HIGH (ref 0.61–1.24)
GFR, Estimated: 5 mL/min — ABNORMAL LOW (ref >60)
Glucose, Bld: 81 mg/dL (ref 70–99)
Phosphorus: 8.1 mg/dL — ABNORMAL HIGH (ref 2.5–4.6)
Potassium: 4.3 mmol/L (ref 3.5–5.1)
Sodium: 138 mmol/L (ref 135–145)

## 2021-12-26 LAB — CBC
HCT: 25.2 % — ABNORMAL LOW (ref 39.0–52.0)
Hemoglobin: 8.8 g/dL — ABNORMAL LOW (ref 13.0–17.0)
MCH: 30 pg (ref 26.0–34.0)
MCHC: 34.9 g/dL (ref 30.0–36.0)
MCV: 86 fL (ref 80.0–100.0)
Platelets: 105 10*3/uL — ABNORMAL LOW (ref 150–400)
RBC: 2.93 MIL/uL — ABNORMAL LOW (ref 4.22–5.81)
RDW: 18.3 % — ABNORMAL HIGH (ref 11.5–15.5)
WBC: 11.8 10*3/uL — ABNORMAL HIGH (ref 4.0–10.5)
nRBC: 0 % (ref 0.0–0.2)

## 2021-12-26 LAB — TYPE AND SCREEN
ABO/RH(D): O POS
Antibody Screen: NEGATIVE
Unit division: 0
Unit division: 0

## 2021-12-26 LAB — BPAM RBC
Blood Product Expiration Date: 202306022359
Blood Product Expiration Date: 202306202359
ISSUE DATE / TIME: 202305252139
ISSUE DATE / TIME: 202305261124
Unit Type and Rh: 5100
Unit Type and Rh: 5100

## 2021-12-26 LAB — HEPATITIS B SURFACE ANTIBODY, QUANTITATIVE: Hep B S AB Quant (Post): 20 m[IU]/mL (ref >9.9)

## 2021-12-26 LAB — PARATHYROID HORMONE, INTACT (NO CA): PTH: 174 pg/mL — ABNORMAL HIGH (ref 15–65)

## 2021-12-26 MED ORDER — PNEUMOCOCCAL 20-VAL CONJ VACC 0.5 ML IM SUSY
0.5000 mL | PREFILLED_SYRINGE | INTRAMUSCULAR | Status: DC
  Filled 2021-12-26: qty 0.5

## 2021-12-26 MED ORDER — AMLODIPINE BESYLATE 10 MG PO TABS
10.0000 mg | ORAL_TABLET | Freq: Every day | ORAL | Status: DC
  Administered 2021-12-26 – 2022-01-04 (×10): 10 mg via ORAL
  Filled 2021-12-26 (×10): qty 1

## 2021-12-26 NOTE — Progress Notes (Signed)
Lower extremity venous bilateral study completed.   Please see CV Proc for preliminary results.   Anella Nakata, RDMS, RVT  

## 2021-12-26 NOTE — Plan of Care (Signed)
  Problem: Clinical Measurements: Goal: Ability to maintain clinical measurements within normal limits will improve Outcome: Progressing Goal: Will remain free from infection Outcome: Progressing Goal: Respiratory complications will improve Outcome: Progressing   Problem: Clinical Measurements: Goal: Will remain free from infection Outcome: Progressing   Problem: Elimination: Goal: Will not experience complications related to urinary retention Outcome: Progressing   Problem: Pain Managment: Goal: General experience of comfort will improve Outcome: Progressing

## 2021-12-26 NOTE — Plan of Care (Signed)

## 2021-12-26 NOTE — Plan of Care (Signed)

## 2021-12-26 NOTE — Progress Notes (Addendum)
PROGRESS NOTE        PATIENT DETAILS Name: Cody Macdonald Age: 61 y.o. Sex: male Date of Birth: 10-26-1960 Admit Date: 12/24/2021 Admitting Physician Toy Baker, MD PCP:Pcp, No  Brief Summary: Patient is a 61 y.o.  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  See below for further details.  Significant events: 5/25>> referred to ED from [facility]-progressive renal failure-concern for ESRD, severe anemia.  Admit to [facility].  Significant studies: 5/25>> CXR: Small right pleural effusion-right basilar subsegmental atelectasis 5/26>> CT abdomen: Small to moderate free fluid in the pelvis, bilateral pleural effusions.  No evidence of SBO.  Significant microbiology data:   Procedures:   Consults: Nephrology, IR  Subjective: No abdominal pain.  Denies any chest pain or shortness of breath.  Objective: Vitals: Blood pressure (!) 174/82, pulse 78, temperature 97.8 F (36.6 C), temperature source Oral, resp. rate 18, height 5\' 7"  (1.702 m), weight 60.4 kg, SpO2 100 %.   Exam: Gen Exam:Alert awake-not in any distress HEENT:atraumatic, normocephalic Chest: B/L clear to auscultation anteriorly CVS:S1S2 regular Abdomen:soft non tender, non distended Extremities:no edema Neurology: Left-sided hemiplegia. Skin: no rash   Pertinent Labs/Radiology:    Latest Ref Rng & Units 12/26/2021    4:50 AM 12/25/2021    6:49 PM 12/25/2021    7:22 AM  CBC  WBC 4.0 - 10.5 K/uL 11.8   8.1   8.5    Hemoglobin 13.0 - 17.0 g/dL 8.8   11.0   6.8    Hematocrit 39.0 - 52.0 % 25.2   31.1   20.6    Platelets 150 - 400 K/uL 105   117   121      Lab Results  Component Value Date   NA 138 12/26/2021   K 4.3 12/26/2021   CL 107 12/26/2021   CO2 18 (L) 12/26/2021      Assessment/Plan: AKI on CKD stage IV-likely progression to ESRD:  Started on HD-seems to be tolerating well so far.  Nephrology following.  Normocytic anemia: Likely due to CKD-no evidence of blood loss (brown stools few days back).  Hb now stable after 2 units of PRBC transfusion-defer Aranesp/iron to nephrology service.    Abdominal pain: No abdominal pain today-CT abdomen on 5/26 without any significant abnormalities.  Supportive care continues.  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  Per patient-he was discharged from [facility] to SNF-where he still resides.  He has not ambulated and is mostly wheelchair-bound.  HTN: BP fluctuating-continue labetalol-increase Norvasc to 10 mg.    HLD: On statin.  Right> left lower extremity edema: Awaiting Doppler studies.  History of alcohol use: Claims no further alcohol use since hospitalization in April for CVA.  Resides at [facility].  Tobacco abuse: Counseled-on transdermal nicotine  Nutrition Status: Nutrition Problem: Moderate Malnutrition Etiology: chronic illness (CKD, stroke) Signs/Symptoms: mild fat depletion, moderate muscle depletion Interventions: Ensure Enlive (each supplement provides 350kcal and 20 grams of protein), MVI, Liberalize Diet, Refer to RD note for recommendations     BMI Estimated body mass index is 20.86 kg/m as calculated from the following:   Height as of this encounter: 5\' 7"  (1.702 m).   Weight as of  this encounter: 60.4 kg.   Code status:   Code Status: Not on file   DVT Prophylaxis:SCD's for now-if hemoglobin stable on 5/28-start SQ heparin.  Family Communication: None at bedside   Disposition Plan: Status is: Inpatient Remains inpatient appropriate because: Likely progression to ESRD-will need initiation of HD.   Planned Discharge Destination:Skilled nursing facility   Diet: Diet Order             Diet regular Room service appropriate?  Yes; Fluid consistency: Thin; Fluid restriction: 1200 mL Fluid  Diet effective now                     Antimicrobial agents: Anti-infectives (From admission, onward)    Start     Dose/Rate Route Frequency Ordered Stop   12/25/21 1015  ceFAZolin (ANCEF) IVPB 2g/100 mL premix        2 g 200 mL/hr over 30 Minutes Intravenous To Radiology 12/25/21 0943 12/25/21 1116        MEDICATIONS: Scheduled Meds:  amLODipine  10 mg Oral Daily   atorvastatin  80 mg Oral Daily   Chlorhexidine Gluconate Cloth  6 each Topical Q0600   feeding supplement  237 mL Oral BID BM   labetalol  100 mg Oral BID   melatonin  3 mg Oral QHS   multivitamin  1 tablet Oral QHS   pantoprazole  40 mg Oral Q1200   Continuous Infusions:  sodium chloride     sodium chloride     PRN Meds:.sodium chloride, sodium chloride, alteplase, heparin, hydrALAZINE, HYDROmorphone (DILAUDID) injection, lidocaine (PF), lidocaine-prilocaine, pentafluoroprop-tetrafluoroeth   I have personally reviewed following labs and imaging studies  LABORATORY DATA: CBC: Recent Labs  Lab 12/24/21 1846 12/25/21 0231 12/25/21 0722 12/25/21 1849 12/26/21 0450  WBC 9.0 8.6 8.5 8.1 11.8*  NEUTROABS 5.8  --   --   --   --   HGB 6.9* 6.6* 6.8* 11.0* 8.8*  HCT 21.0* 20.5* 20.6* 31.1* 25.2*  MCV 94.2 89.9 90.7 84.5 86.0  PLT 139* 124* 121* 117* 105*     Basic Metabolic Panel: Recent Labs  Lab 12/24/21 1846 12/25/21 0231 12/26/21 0450  NA 142  --  138  K 4.6  --  4.3  CL 117*  --  107  CO2 12*  --  18*  GLUCOSE 99  --  81  BUN 100*  --  67*  CREATININE 13.84*  --  10.24*  CALCIUM 8.1*  --  8.3*  MG  --  1.8  --   PHOS  --  8.3* 8.1*     GFR: Estimated Creatinine Clearance: 6.6 mL/min (A) (by C-G formula based on SCr of 10.24 mg/dL (H)).  Liver Function Tests: Recent Labs  Lab 12/24/21 1846 12/26/21 0450  AST 9*  --   ALT 8  --   ALKPHOS 90  --   BILITOT 0.5  --   PROT 6.8  --   ALBUMIN 2.4* 2.0*    No  results for input(s): LIPASE, AMYLASE in the last 168 hours. No results for input(s): AMMONIA in the last 168 hours.  Coagulation Profile: Recent Labs  Lab 12/24/21 1846  INR 1.1     Cardiac Enzymes: Recent Labs  Lab 12/25/21 0231  CKTOTAL 119     BNP (last 3 results) No results for input(s): PROBNP in the last 8760 hours.  Lipid Profile: Recent Labs    12/25/21 0231  CHOL 140  HDL 30*  LDLCALC 85  TRIG 124  CHOLHDL 4.7     Thyroid Function Tests: Recent Labs    12/25/21 0231  TSH 2.965     Anemia Panel: Recent Labs    12/24/21 1840 12/24/21 1846  VITAMINB12  --  2,631*  FOLATE 15.7  --   FERRITIN  --  459*  TIBC  --  241*  IRON  --  69  RETICCTPCT 3.4*  --      Urine analysis: No results found for: COLORURINE, APPEARANCEUR, LABSPEC, PHURINE, GLUCOSEU, HGBUR, BILIRUBINUR, KETONESUR, PROTEINUR, UROBILINOGEN, NITRITE, LEUKOCYTESUR  Sepsis Labs: Lactic Acid, Venous No results found for: LATICACIDVEN  MICROBIOLOGY: No results found for this or any previous visit (from the past 240 hour(s)).  RADIOLOGY STUDIES/RESULTS: CT ABDOMEN PELVIS WO CONTRAST  Result Date: 12/25/2021 CLINICAL DATA:  Abdominal pain, acute, nonlocalized LUQ pain -Xray neg EXAM: CT ABDOMEN AND PELVIS WITHOUT CONTRAST TECHNIQUE: Multidetector CT imaging of the abdomen and pelvis was performed following the standard protocol without IV contrast. RADIATION DOSE REDUCTION: This exam was performed according to the departmental dose-optimization program which includes automated exposure control, adjustment of the mA and/or kV according to patient size and/or use of iterative reconstruction technique. COMPARISON:  None Available. FINDINGS: Lower chest: Bilateral pleural effusions, right larger than left. Compressive atelectasis in the lower lobes. Heart is normal size. Hepatobiliary: No focal hepatic abnormality. Gallbladder unremarkable. Pancreas: No focal abnormality or ductal dilatation.  Spleen: No focal abnormality.  Normal size. Adrenals/Urinary Tract: No adrenal abnormality. No focal renal abnormality. No stones or hydronephrosis. Urinary bladder is unremarkable. Stomach/Bowel: Normal appendix. Stomach, large and small bowel grossly unremarkable. Vascular/Lymphatic: Aortic atherosclerosis. No evidence of aneurysm or adenopathy. Reproductive: No visible focal abnormality. Other: Small to moderate free fluid in the pelvis. Musculoskeletal: No acute bony abnormality. IMPRESSION: Small to moderate free fluid in the pelvis of unknown etiology. Bilateral pleural effusions, right greater than left. Compressive atelectasis in the lower lobes. Aortic atherosclerosis. Electronically Signed   By: Rolm Baptise M.D.   On: 12/25/2021 21:10   DG Abd 1 View  Result Date: 12/24/2021 CLINICAL DATA:  Abdominal distension EXAM: ABDOMEN - 1 VIEW COMPARISON:  None Available. FINDINGS: The bowel gas pattern is normal. No radio-opaque calculi or other significant radiographic abnormality are seen. IMPRESSION: Negative. Electronically Signed   By: Rolm Baptise M.D.   On: 12/24/2021 21:37   IR Fluoro Guide CV Line Right  Result Date: 12/25/2021 INDICATION: Renal failure EXAM: Ultrasound and fluoroscopy guided tunneled right IJ hemodialysis catheter placement MEDICATIONS: Ancef 2 g IV; The antibiotic was administered within an appropriate time interval prior to skin puncture. ANESTHESIA/SEDATION: Moderate (conscious) sedation was employed during this procedure. A total of Versed 1 mg and Fentanyl 50 mcg was administered intravenously by the radiology nurse. Total intra-service moderate Sedation Time: 10 minutes. The patient's level of consciousness and vital signs were monitored continuously by radiology nursing throughout the procedure under my direct supervision. FLUOROSCOPY: Radiation Exposure Index (as provided by the fluoroscopic device): 0.6 mGy Kerma COMPLICATIONS: None immediate. PROCEDURE: Informed written  consent was obtained from the patient after a thorough discussion of the procedural risks, benefits and alternatives. All questions were addressed. Maximal Sterile Barrier Technique was utilized including caps, mask, sterile gowns, sterile gloves, sterile drape, hand hygiene and skin antiseptic. A timeout was performed prior to the initiation of the procedure. The right internal jugular vein was evaluated with ultrasound and shown to be patent. A permanent ultrasound image was obtained and placed in the patient's medical record.  Using sterile gel and a sterile probe cover, the right internal jugular vein was entered with a 21 ga needle during real time ultrasound guidance. 0.018 inch guidewire placed and 21 ga needle exchanged for transitional dilator set. Utilizing fluoroscopy, 0.035 inch guidewire advanced through the needle without difficulty. Seriel dilation was performed and peel-away sheath was placed. Attention then turned to the right anterior upper chest. Following local lidocaine administration, the hemodialysis catheter was tunneled from the chest wall to the venotomy site. The catheter was inserted through the peel-away sheath. The tip of the catheter was positioned within the right atrium using fluoroscopic guidance. All lumens of the catheter aspirated and flushed well. The dialysis lumens were locked with Heparin. The catheter was secured to the skin with suture. The insertion site was covered with sterile dressing. IMPRESSION: 19 cm right IJ tunneled hemodialysis catheter is ready for use. Electronically Signed   By: Miachel Roux M.D.   On: 12/25/2021 15:10   IR US Guide Vasc Access Right  Result Date: 12/25/2021 INDICATION: Renal failure EXAM: Ultrasound and fluoroscopy guided tunneled right IJ hemodialysis catheter placement MEDICATIONS: Ancef 2 g IV; The antibiotic was administered within an appropriate time interval prior to skin puncture. ANESTHESIA/SEDATION: Moderate (conscious) sedation  was employed during this procedure. A total of Versed 1 mg and Fentanyl 50 mcg was administered intravenously by the radiology nurse. Total intra-service moderate Sedation Time: 10 minutes. The patient's level of consciousness and vital signs were monitored continuously by radiology nursing throughout the procedure under my direct supervision. FLUOROSCOPY: Radiation Exposure Index (as provided by the fluoroscopic device): 0.6 mGy Kerma COMPLICATIONS: None immediate. PROCEDURE: Informed written consent was obtained from the patient after a thorough discussion of the procedural risks, benefits and alternatives. All questions were addressed. Maximal Sterile Barrier Technique was utilized including caps, mask, sterile gowns, sterile gloves, sterile drape, hand hygiene and skin antiseptic. A timeout was performed prior to the initiation of the procedure. The right internal jugular vein was evaluated with ultrasound and shown to be patent. A permanent ultrasound image was obtained and placed in the patient's medical record. Using sterile gel and a sterile probe cover, the right internal jugular vein was entered with a 21 ga needle during real time ultrasound guidance. 0.018 inch guidewire placed and 21 ga needle exchanged for transitional dilator set. Utilizing fluoroscopy, 0.035 inch guidewire advanced through the needle without difficulty. Seriel dilation was performed and peel-away sheath was placed. Attention then turned to the right anterior upper chest. Following local lidocaine administration, the hemodialysis catheter was tunneled from the chest wall to the venotomy site. The catheter was inserted through the peel-away sheath. The tip of the catheter was positioned within the right atrium using fluoroscopic guidance. All lumens of the catheter aspirated and flushed well. The dialysis lumens were locked with Heparin. The catheter was secured to the skin with suture. The insertion site was covered with sterile  dressing. IMPRESSION: 19 cm right IJ tunneled hemodialysis catheter is ready for use. Electronically Signed   By: Miachel Roux M.D.   On: 12/25/2021 15:10   DG Chest Port 1 View  Result Date: 12/24/2021 CLINICAL DATA:  Shortness of breath. EXAM: PORTABLE CHEST 1 VIEW COMPARISON:  None Available. FINDINGS: Cardiac silhouette and mediastinal contours are within normal limits. There is a small right pleural effusion with associated basilar opacity. The left lung is clear. No pneumothorax. No acute skeletal abnormality. Surgical clips overlie the gastroesophageal junction. IMPRESSION: Small right pleural effusion with associated right  basilar likely subsegmental atelectasis. Electronically Signed   By: Yvonne Kendall M.D.   On: 12/24/2021 18:52     LOS: 2 days   Oren Binet, MD  [facility]    To contact the attending provider between 7A-7P or the covering provider during after hours 7P-7A, please log into the web site [email or web address] and access using universal  password for that web site. If you do not have the password, please call the hospital operator.  12/26/2021, 7:25 AM

## 2021-12-26 NOTE — Progress Notes (Signed)
Patient ID: Cody Macdonald, male   DOB: [birth year], 61 y.o.   MRN: [id number] [facility] Progress Note   Assessment/ Plan:   1.  Acute kidney injury on chronic kidney disease- Unclear if this is evolution to ESRD: Suspected to have had progression of chronic kidney disease and with development of uremic symptoms and yesterday started on dialysis. He is getting his second treatment today and the process is underway for OP HD unit placement (which will likely not be locally @[facility]  units due to insurance issues per renal navigator note of 5/26). 2.  Symptomatic anemia: Unclear if he had any overt losses-patient unsure.  Status post PRBC transfusion with improvement of H/H; no overt losses overnight. 3.  History of CVA: Resume antiplatelet therapy in 24 hours (some concern for [facility] site bleeding). 4.  Hypertension: Resume oral antihypertensive agents and monitor with ultrafiltration/hemodialysis. 5.  Chronic kidney disease-metabolic bone disease: Continue to monitor calcium and phosphorus levels to decide on need for initiating binder while on renal diet. 6.  Nutrition: Likely compromised by advancing chronic kidney disease and history of alcohol use.  Monitor on ONS.  Subjective:   No acute events overnight- tolerated RIJ TDC placement and his first HD without problems yesterday.    Objective:   BP (!) 167/77   Pulse 79   Temp 98.6 F (37 C) (Oral)   Resp 16   Ht 5\' 7"  (1.702 m)   Wt 57.4 kg   SpO2 100%   BMI 19.82 kg/m   Intake/Output Summary (Last 24 hours) at 12/26/2021 3810 Last data filed at 12/25/2021 2009 Gross per 24 hour  Intake 336.67 ml  Output 475 ml  Net -138.33 ml   Weight change: 5.74 kg  Physical Exam: FBP:ZWCH on dialysis- resting comfortably ENI:DPOEU regular rhythm and normal rate, normal S1 and S2 Resp: Cleats to auscultation bilaterally, no rales/rhonchi MPN:TIRW, mild LUQ/epigastric tenderness ERX:VQMGQ LE edema  Imaging: CT ABDOMEN PELVIS WO  CONTRAST  Result Date: 12/25/2021 CLINICAL DATA:  Abdominal pain, acute, nonlocalized LUQ pain -Xray neg EXAM: CT ABDOMEN AND PELVIS WITHOUT CONTRAST TECHNIQUE: Multidetector CT imaging of the abdomen and pelvis was performed following the standard protocol without IV contrast. RADIATION DOSE REDUCTION: This exam was performed according to the departmental dose-optimization program which includes automated exposure control, adjustment of the mA and/or kV according to patient size and/or use of iterative reconstruction technique. COMPARISON:  None Available. FINDINGS: Lower chest: Bilateral pleural effusions, right larger than left. Compressive atelectasis in the lower lobes. Heart is normal size. Hepatobiliary: No focal hepatic abnormality. Gallbladder unremarkable. Pancreas: No focal abnormality or ductal dilatation. Spleen: No focal abnormality.  Normal size. Adrenals/Urinary Tract: No adrenal abnormality. No focal renal abnormality. No stones or hydronephrosis. Urinary bladder is unremarkable. Stomach/Bowel: Normal appendix. Stomach, large and small bowel grossly unremarkable. Vascular/Lymphatic: Aortic atherosclerosis. No evidence of aneurysm or adenopathy. Reproductive: No visible focal abnormality. Other: Small to moderate free fluid in the pelvis. Musculoskeletal: No acute bony abnormality. IMPRESSION: Small to moderate free fluid in the pelvis of unknown etiology. Bilateral pleural effusions, right greater than left. Compressive atelectasis in the lower lobes. Aortic atherosclerosis. Electronically Signed   By: Rolm Baptise M.D.   On: 12/25/2021 21:10   DG Abd 1 View  Result Date: 12/24/2021 CLINICAL DATA:  Abdominal distension EXAM: ABDOMEN - 1 VIEW COMPARISON:  None Available. FINDINGS: The bowel gas pattern is normal. No radio-opaque calculi or other significant radiographic abnormality are seen. IMPRESSION: Negative. Electronically Signed  By: Rolm Baptise M.D.   On: 12/24/2021 21:37   IR Fluoro  Guide CV Line Right  Result Date: 12/25/2021 INDICATION: Renal failure EXAM: Ultrasound and fluoroscopy guided tunneled right IJ hemodialysis catheter placement MEDICATIONS: Ancef 2 g IV; The antibiotic was administered within an appropriate time interval prior to skin puncture. ANESTHESIA/SEDATION: Moderate (conscious) sedation was employed during this procedure. A total of Versed 1 mg and Fentanyl 50 mcg was administered intravenously by the radiology nurse. Total intra-service moderate Sedation Time: 10 minutes. The patient's level of consciousness and vital signs were monitored continuously by radiology nursing throughout the procedure under my direct supervision. FLUOROSCOPY: Radiation Exposure Index (as provided by the fluoroscopic device): 0.6 mGy Kerma COMPLICATIONS: None immediate. PROCEDURE: Informed written consent was obtained from the patient after a thorough discussion of the procedural risks, benefits and alternatives. All questions were addressed. Maximal Sterile Barrier Technique was utilized including caps, mask, sterile gowns, sterile gloves, sterile drape, hand hygiene and skin antiseptic. A timeout was performed prior to the initiation of the procedure. The right internal jugular vein was evaluated with ultrasound and shown to be patent. A permanent ultrasound image was obtained and placed in the patient's medical record. Using sterile gel and a sterile probe cover, the right internal jugular vein was entered with a 21 ga needle during real time ultrasound guidance. 0.018 inch guidewire placed and 21 ga needle exchanged for transitional dilator set. Utilizing fluoroscopy, 0.035 inch guidewire advanced through the needle without difficulty. Seriel dilation was performed and peel-away sheath was placed. Attention then turned to the right anterior upper chest. Following local lidocaine administration, the hemodialysis catheter was tunneled from the chest wall to the venotomy site. The catheter  was inserted through the peel-away sheath. The tip of the catheter was positioned within the right atrium using fluoroscopic guidance. All lumens of the catheter aspirated and flushed well. The dialysis lumens were locked with Heparin. The catheter was secured to the skin with suture. The insertion site was covered with sterile dressing. IMPRESSION: 19 cm right IJ tunneled hemodialysis catheter is ready for use. Electronically Signed   By: Miachel Roux M.D.   On: 12/25/2021 15:10   IR US Guide Vasc Access Right  Result Date: 12/25/2021 INDICATION: Renal failure EXAM: Ultrasound and fluoroscopy guided tunneled right IJ hemodialysis catheter placement MEDICATIONS: Ancef 2 g IV; The antibiotic was administered within an appropriate time interval prior to skin puncture. ANESTHESIA/SEDATION: Moderate (conscious) sedation was employed during this procedure. A total of Versed 1 mg and Fentanyl 50 mcg was administered intravenously by the radiology nurse. Total intra-service moderate Sedation Time: 10 minutes. The patient's level of consciousness and vital signs were monitored continuously by radiology nursing throughout the procedure under my direct supervision. FLUOROSCOPY: Radiation Exposure Index (as provided by the fluoroscopic device): 0.6 mGy Kerma COMPLICATIONS: None immediate. PROCEDURE: Informed written consent was obtained from the patient after a thorough discussion of the procedural risks, benefits and alternatives. All questions were addressed. Maximal Sterile Barrier Technique was utilized including caps, mask, sterile gowns, sterile gloves, sterile drape, hand hygiene and skin antiseptic. A timeout was performed prior to the initiation of the procedure. The right internal jugular vein was evaluated with ultrasound and shown to be patent. A permanent ultrasound image was obtained and placed in the patient's medical record. Using sterile gel and a sterile probe cover, the right internal jugular vein was  entered with a 21 ga needle during real time ultrasound guidance. 0.018 inch guidewire placed and  21 ga needle exchanged for transitional dilator set. Utilizing fluoroscopy, 0.035 inch guidewire advanced through the needle without difficulty. Seriel dilation was performed and peel-away sheath was placed. Attention then turned to the right anterior upper chest. Following local lidocaine administration, the hemodialysis catheter was tunneled from the chest wall to the venotomy site. The catheter was inserted through the peel-away sheath. The tip of the catheter was positioned within the right atrium using fluoroscopic guidance. All lumens of the catheter aspirated and flushed well. The dialysis lumens were locked with Heparin. The catheter was secured to the skin with suture. The insertion site was covered with sterile dressing. IMPRESSION: 19 cm right IJ tunneled hemodialysis catheter is ready for use. Electronically Signed   By: Miachel Roux M.D.   On: 12/25/2021 15:10   DG Chest Port 1 View  Result Date: 12/24/2021 CLINICAL DATA:  Shortness of breath. EXAM: PORTABLE CHEST 1 VIEW COMPARISON:  None Available. FINDINGS: Cardiac silhouette and mediastinal contours are within normal limits. There is a small right pleural effusion with associated basilar opacity. The left lung is clear. No pneumothorax. No acute skeletal abnormality. Surgical clips overlie the gastroesophageal junction. IMPRESSION: Small right pleural effusion with associated right basilar likely subsegmental atelectasis. Electronically Signed   By: Yvonne Kendall M.D.   On: 12/24/2021 18:52    Labs: BMET Recent Labs  Lab 12/24/21 1846 12/25/21 0231 12/26/21 0450  NA 142  --  138  K 4.6  --  4.3  CL 117*  --  107  CO2 12*  --  18*  GLUCOSE 99  --  81  BUN 100*  --  67*  CREATININE 13.84*  --  10.24*  CALCIUM 8.1*  --  8.3*  PHOS  --  8.3* 8.1*   CBC Recent Labs  Lab 12/24/21 1846 12/25/21 0231 12/25/21 0722 12/25/21 1849  12/26/21 0450  WBC 9.0 8.6 8.5 8.1 11.8*  NEUTROABS 5.8  --   --   --   --   HGB 6.9* 6.6* 6.8* 11.0* 8.8*  HCT 21.0* 20.5* 20.6* 31.1* 25.2*  MCV 94.2 89.9 90.7 84.5 86.0  PLT 139* 124* 121* 117* 105*    Medications:     amLODipine  10 mg Oral Daily   atorvastatin  80 mg Oral Daily   Chlorhexidine Gluconate Cloth  6 each Topical Q0600   feeding supplement  237 mL Oral BID BM   labetalol  100 mg Oral BID   melatonin  3 mg Oral QHS   multivitamin  1 tablet Oral QHS   pantoprazole  40 mg Oral Q1200   [START ON 12/27/2021] pneumococcal 20-valent conjugate vaccine  0.5 mL Intramuscular Tomorrow-1000   Elmarie Shiley, MD 12/26/2021, 9:49 AM

## 2021-12-27 DIAGNOSIS — N179 Acute kidney failure, unspecified: Secondary | ICD-10-CM | POA: Diagnosis not present

## 2021-12-27 DIAGNOSIS — Z720 Tobacco use: Secondary | ICD-10-CM | POA: Diagnosis not present

## 2021-12-27 DIAGNOSIS — R600 Localized edema: Secondary | ICD-10-CM | POA: Diagnosis not present

## 2021-12-27 DIAGNOSIS — D649 Anemia, unspecified: Secondary | ICD-10-CM | POA: Diagnosis not present

## 2021-12-27 MED ORDER — CLOPIDOGREL BISULFATE 75 MG PO TABS
75.0000 mg | ORAL_TABLET | Freq: Every day | ORAL | Status: DC
  Administered 2021-12-27 – 2022-01-04 (×9): 75 mg via ORAL
  Filled 2021-12-27 (×9): qty 1

## 2021-12-27 MED ORDER — ASPIRIN 81 MG PO TBEC
81.0000 mg | DELAYED_RELEASE_TABLET | Freq: Every day | ORAL | Status: DC
  Administered 2021-12-27 – 2022-01-04 (×9): 81 mg via ORAL
  Filled 2021-12-27 (×9): qty 1

## 2021-12-27 MED ORDER — ATORVASTATIN CALCIUM 80 MG PO TABS
80.0000 mg | ORAL_TABLET | Freq: Every day | ORAL | Status: DC
  Administered 2021-12-27 – 2022-01-03 (×8): 80 mg via ORAL
  Filled 2021-12-27 (×8): qty 1

## 2021-12-27 MED ORDER — HEPARIN SODIUM (PORCINE) 5000 UNIT/ML IJ SOLN
5000.0000 [IU] | Freq: Three times a day (TID) | INTRAMUSCULAR | Status: DC
Start: 2021-12-27 — End: 2022-01-04
  Administered 2021-12-27 – 2022-01-04 (×24): 5000 [IU] via SUBCUTANEOUS
  Filled 2021-12-27 (×24): qty 1

## 2021-12-27 NOTE — Progress Notes (Addendum)
Patient ID: Cody Macdonald, male   DOB: 04-Aug-1960, 61 y.o.   MRN: [id number] [facility] Progress Note   Assessment/ Plan:   1.  Acute kidney injury on chronic kidney disease- Unclear if this is evolution to ESRD: Suspected to have had progression of chronic kidney disease and with development of uremic symptoms and started on hemodialysis 5/26 with second treatment on 5/27.  Unclear if this is end-stage renal disease or if he will have meaningful recovery of renal function.  The process is underway for OP HD unit placement (which will likely not be locally @[facility]  units due to insurance issues per renal navigator note of 5/26).  Dialysis ordered for tomorrow. 2.  Symptomatic anemia: Unclear if he had any overt losses-patient does not report noticing any.  Status post PRBC transfusion 2 days ago with improvement of H/H; no overt losses overnight. 3.  History of CVA: Resume antiplatelet therapy when deemed appropriate, no additional hemorrhage noted from [facility] site. 4.  Hypertension: Resume oral antihypertensive agents and monitor with ultrafiltration/hemodialysis. 5.  Chronic kidney disease-metabolic bone disease: Continue to monitor calcium and phosphorus levels to decide on need for initiating binder while on renal diet. 6.  Nutrition: Likely compromised by advancing chronic kidney disease and history of alcohol use.  Monitor on ONS.  Subjective:   Denies any acute events overnight and states that he is feeling better this morning.   Objective:   BP (!) 173/92 (BP Location: Left Arm)   Pulse 83   Temp 98 F (36.7 C) (Oral)   Resp 15   Ht 5\' 7"  (1.702 m)   Wt 57.9 kg   SpO2 100%   BMI 19.99 kg/m   Intake/Output Summary (Last 24 hours) at 12/27/2021 1014 Last data filed at 12/27/2021 0309 Gross per 24 hour  Intake --  Output 250 ml  Net -250 ml   Weight change: -6.4 kg  Physical Exam: Gen: Comfortably resting in bed, on oxygen via nasal cannula IWP:YKDXI regular rhythm  and normal rate, normal S1 and S2 Resp: Both lung fields are clear to auscultation with decreased breath sounds over bases, no rales/rhonchi PJA:SNKN, mild LUQ/epigastric tenderness LZJ:QBHAL LE edema  Imaging: CT ABDOMEN PELVIS WO CONTRAST  Result Date: 12/25/2021 CLINICAL DATA:  Abdominal pain, acute, nonlocalized LUQ pain -Xray neg EXAM: CT ABDOMEN AND PELVIS WITHOUT CONTRAST TECHNIQUE: Multidetector CT imaging of the abdomen and pelvis was performed following the standard protocol without IV contrast. RADIATION DOSE REDUCTION: This exam was performed according to the departmental dose-optimization program which includes automated exposure control, adjustment of the mA and/or kV according to patient size and/or use of iterative reconstruction technique. COMPARISON:  None Available. FINDINGS: Lower chest: Bilateral pleural effusions, right larger than left. Compressive atelectasis in the lower lobes. Heart is normal size. Hepatobiliary: No focal hepatic abnormality. Gallbladder unremarkable. Pancreas: No focal abnormality or ductal dilatation. Spleen: No focal abnormality.  Normal size. Adrenals/Urinary Tract: No adrenal abnormality. No focal renal abnormality. No stones or hydronephrosis. Urinary bladder is unremarkable. Stomach/Bowel: Normal appendix. Stomach, large and small bowel grossly unremarkable. Vascular/Lymphatic: Aortic atherosclerosis. No evidence of aneurysm or adenopathy. Reproductive: No visible focal abnormality. Other: Small to moderate free fluid in the pelvis. Musculoskeletal: No acute bony abnormality. IMPRESSION: Small to moderate free fluid in the pelvis of unknown etiology. Bilateral pleural effusions, right greater than left. Compressive atelectasis in the lower lobes. Aortic atherosclerosis. Electronically Signed   By: Rolm Baptise M.D.   On: 12/25/2021 21:10   IR  Fluoro Guide CV Line Right  Result Date: 12/25/2021 INDICATION: Renal failure EXAM: Ultrasound and fluoroscopy  guided tunneled right IJ hemodialysis catheter placement MEDICATIONS: Ancef 2 g IV; The antibiotic was administered within an appropriate time interval prior to skin puncture. ANESTHESIA/SEDATION: Moderate (conscious) sedation was employed during this procedure. A total of Versed 1 mg and Fentanyl 50 mcg was administered intravenously by the radiology nurse. Total intra-service moderate Sedation Time: 10 minutes. The patient's level of consciousness and vital signs were monitored continuously by radiology nursing throughout the procedure under my direct supervision. FLUOROSCOPY: Radiation Exposure Index (as provided by the fluoroscopic device): 0.6 mGy Kerma COMPLICATIONS: None immediate. PROCEDURE: Informed written consent was obtained from the patient after a thorough discussion of the procedural risks, benefits and alternatives. All questions were addressed. Maximal Sterile Barrier Technique was utilized including caps, mask, sterile gowns, sterile gloves, sterile drape, hand hygiene and skin antiseptic. A timeout was performed prior to the initiation of the procedure. The right internal jugular vein was evaluated with ultrasound and shown to be patent. A permanent ultrasound image was obtained and placed in the patient's medical record. Using sterile gel and a sterile probe cover, the right internal jugular vein was entered with a 21 ga needle during real time ultrasound guidance. 0.018 inch guidewire placed and 21 ga needle exchanged for transitional dilator set. Utilizing fluoroscopy, 0.035 inch guidewire advanced through the needle without difficulty. Seriel dilation was performed and peel-away sheath was placed. Attention then turned to the right anterior upper chest. Following local lidocaine administration, the hemodialysis catheter was tunneled from the chest wall to the venotomy site. The catheter was inserted through the peel-away sheath. The tip of the catheter was positioned within the right atrium  using fluoroscopic guidance. All lumens of the catheter aspirated and flushed well. The dialysis lumens were locked with Heparin. The catheter was secured to the skin with suture. The insertion site was covered with sterile dressing. IMPRESSION: 19 cm right IJ tunneled hemodialysis catheter is ready for use. Electronically Signed   By: Miachel Roux M.D.   On: 12/25/2021 15:10   IR US Guide Vasc Access Right  Result Date: 12/25/2021 INDICATION: Renal failure EXAM: Ultrasound and fluoroscopy guided tunneled right IJ hemodialysis catheter placement MEDICATIONS: Ancef 2 g IV; The antibiotic was administered within an appropriate time interval prior to skin puncture. ANESTHESIA/SEDATION: Moderate (conscious) sedation was employed during this procedure. A total of Versed 1 mg and Fentanyl 50 mcg was administered intravenously by the radiology nurse. Total intra-service moderate Sedation Time: 10 minutes. The patient's level of consciousness and vital signs were monitored continuously by radiology nursing throughout the procedure under my direct supervision. FLUOROSCOPY: Radiation Exposure Index (as provided by the fluoroscopic device): 0.6 mGy Kerma COMPLICATIONS: None immediate. PROCEDURE: Informed written consent was obtained from the patient after a thorough discussion of the procedural risks, benefits and alternatives. All questions were addressed. Maximal Sterile Barrier Technique was utilized including caps, mask, sterile gowns, sterile gloves, sterile drape, hand hygiene and skin antiseptic. A timeout was performed prior to the initiation of the procedure. The right internal jugular vein was evaluated with ultrasound and shown to be patent. A permanent ultrasound image was obtained and placed in the patient's medical record. Using sterile gel and a sterile probe cover, the right internal jugular vein was entered with a 21 ga needle during real time ultrasound guidance. 0.018 inch guidewire placed and 21 ga  needle exchanged for transitional dilator set. Utilizing fluoroscopy, 0.035 inch guidewire  advanced through the needle without difficulty. Seriel dilation was performed and peel-away sheath was placed. Attention then turned to the right anterior upper chest. Following local lidocaine administration, the hemodialysis catheter was tunneled from the chest wall to the venotomy site. The catheter was inserted through the peel-away sheath. The tip of the catheter was positioned within the right atrium using fluoroscopic guidance. All lumens of the catheter aspirated and flushed well. The dialysis lumens were locked with Heparin. The catheter was secured to the skin with suture. The insertion site was covered with sterile dressing. IMPRESSION: 19 cm right IJ tunneled hemodialysis catheter is ready for use. Electronically Signed   By: Miachel Roux M.D.   On: 12/25/2021 15:10   VAS [place] LOWER EXTREMITY VENOUS (DVT)  Result Date: 12/26/2021  Lower Venous DVT Study Patient Name:  Cody Macdonald  Date of Exam:   12/26/2021 Medical Rec #: [id number]         Accession #:    [id number] Date of Birth: 1961/01/12         Patient Gender: M Patient Age:   70 years Exam Location:  [facility] Procedure:      VAS [place] LOWER EXTREMITY VENOUS (DVT) Referring Phys: Nyoka Lint DOUTOVA --------------------------------------------------------------------------------  Indications: Swelling.  Comparison Study: No prior studies. Performing Technologist: Darlin Coco RDMS, RVT  Examination Guidelines: A complete evaluation includes B-mode imaging, spectral Doppler, color Doppler, and power Doppler as needed of all accessible portions of each vessel. Bilateral testing is considered an integral part of a complete examination. Limited examinations for reoccurring indications may be performed as noted. The reflux portion of the exam is performed with the patient in reverse Trendelenburg.   +---------+---------------+---------+-----------+----------+--------------+ RIGHT    CompressibilityPhasicitySpontaneityPropertiesThrombus Aging +---------+---------------+---------+-----------+----------+--------------+ CFV      Full           Yes      Yes                                 +---------+---------------+---------+-----------+----------+--------------+ SFJ      Full                                                        +---------+---------------+---------+-----------+----------+--------------+ FV Prox  Full                                                        +---------+---------------+---------+-----------+----------+--------------+ FV Mid   Full                                                        +---------+---------------+---------+-----------+----------+--------------+ FV DistalFull                                                        +---------+---------------+---------+-----------+----------+--------------+ PFV  Full                                                        +---------+---------------+---------+-----------+----------+--------------+ POP      Full           Yes      Yes                                 +---------+---------------+---------+-----------+----------+--------------+ PTV      Full                                                        +---------+---------------+---------+-----------+----------+--------------+ PERO     Full                                                        +---------+---------------+---------+-----------+----------+--------------+   +---------+---------------+---------+-----------+----------+--------------+ LEFT     CompressibilityPhasicitySpontaneityPropertiesThrombus Aging +---------+---------------+---------+-----------+----------+--------------+ CFV      Full           Yes      Yes                                  +---------+---------------+---------+-----------+----------+--------------+ SFJ      Full                                                        +---------+---------------+---------+-----------+----------+--------------+ FV Prox  Full                                                        +---------+---------------+---------+-----------+----------+--------------+ FV Mid   Full                                                        +---------+---------------+---------+-----------+----------+--------------+ FV DistalFull                                                        +---------+---------------+---------+-----------+----------+--------------+ PFV      Full                                                        +---------+---------------+---------+-----------+----------+--------------+  POP      Full           Yes      Yes                                 +---------+---------------+---------+-----------+----------+--------------+ PTV      Full                                                        +---------+---------------+---------+-----------+----------+--------------+ PERO     Full                                                        +---------+---------------+---------+-----------+----------+--------------+     Summary: RIGHT: - There is no evidence of deep vein thrombosis in the lower extremity.  - No cystic structure found in the popliteal fossa.  LEFT: - There is no evidence of deep vein thrombosis in the lower extremity.  - No cystic structure found in the popliteal fossa.  *See table(s) above for measurements and observations.    Preliminary     Labs: BMET Recent Labs  Lab 12/24/21 1846 12/25/21 0231 12/26/21 0450  NA 142  --  138  K 4.6  --  4.3  CL 117*  --  107  CO2 12*  --  18*  GLUCOSE 99  --  81  BUN 100*  --  67*  CREATININE 13.84*  --  10.24*  CALCIUM 8.1*  --  8.3*  PHOS  --  8.3* 8.1*   CBC Recent Labs  Lab  12/24/21 1846 12/25/21 0231 12/25/21 0722 12/25/21 1849 12/26/21 0450  WBC 9.0 8.6 8.5 8.1 11.8*  NEUTROABS 5.8  --   --   --   --   HGB 6.9* 6.6* 6.8* 11.0* 8.8*  HCT 21.0* 20.5* 20.6* 31.1* 25.2*  MCV 94.2 89.9 90.7 84.5 86.0  PLT 139* 124* 121* 117* 105*    Medications:     amLODipine  10 mg Oral Daily   atorvastatin  80 mg Oral Daily   Chlorhexidine Gluconate Cloth  6 each Topical Q0600   feeding supplement  237 mL Oral BID BM   labetalol  100 mg Oral BID   melatonin  3 mg Oral QHS   multivitamin  1 tablet Oral QHS   pantoprazole  40 mg Oral Q1200   pneumococcal 20-valent conjugate vaccine  0.5 mL Intramuscular Tomorrow-1000   Elmarie Shiley, MD 12/27/2021, 10:14 AM

## 2021-12-27 NOTE — Progress Notes (Signed)
PROGRESS NOTE        PATIENT DETAILS Name: Cody Macdonald Age: 61 y.o. Sex: male Date of Birth: 1961/07/11 Admit Date: 12/24/2021 Admitting Physician Toy Baker, MD PCP:Pcp, No  Brief Summary: Patient is a 61 y.o.  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  See below for further details.  Significant events: 5/25>> referred to ED from [facility]-progressive renal failure-concern for ESRD, severe anemia.  Admit to [facility].  Significant studies: 5/25>> CXR: Small right pleural effusion-right basilar subsegmental atelectasis 5/26>> CT abdomen: Small to moderate free fluid in the pelvis, bilateral pleural effusions.  No evidence of SBO. 5/27>> bilateral lower extremity Doppler: No DVT.  Significant microbiology data:   Procedures:   Consults: Nephrology, IR  Subjective: Lying comfortably-no chest pain-no abdominal pain.  Objective: Vitals: Blood pressure (!) 173/92, pulse 83, temperature 98 F (36.7 C), temperature source Oral, resp. rate 15, height 5\' 7"  (1.702 m), weight 57.9 kg, SpO2 100 %.   Exam: Gen Exam:Alert awake-not in any distress HEENT:atraumatic, normocephalic Chest: B/L clear to auscultation anteriorly CVS:S1S2 regular Abdomen:soft non tender, non distended Extremities:no edema Neurology: Sided hemiplegia. Skin: no rash   Pertinent Labs/Radiology:    Latest Ref Rng & Units 12/26/2021    4:50 AM 12/25/2021    6:49 PM 12/25/2021    7:22 AM  CBC  WBC 4.0 - 10.5 K/uL 11.8   8.1   8.5    Hemoglobin 13.0 - 17.0 g/dL 8.8   11.0   6.8    Hematocrit 39.0 - 52.0 % 25.2   31.1   20.6    Platelets 150 - 400 K/uL 105   117   121      Lab Results  Component Value Date   NA 138 12/26/2021   K 4.3 12/26/2021   CL 107 12/26/2021   CO2 18 (L) 12/26/2021      Assessment/Plan: AKI on CKD stage  IV-likely progression to ESRD: Started on HD this admit-tolerating well so far-nephrology following-arrangements being made for for outpatient HD.    Normocytic anemia: Likely due to CKD-no evidence of blood loss (brown stools few days back).  Hb now stable after 2 units of PRBC transfusion-defer Aranesp/iron to nephrology service.  Follow CBC periodically.  Abdominal pain: No abdominal pain today-CT abdomen on 5/26 without any significant abnormalities.  Supportive care continues.  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  Have ordered PT/OT.  Per patient-he was discharged from [facility] to SNF-where he still resides.  He has not ambulated and is mostly wheelchair-bound.  HTN: BP continues to fluctuate-continue Norvasc/labetalol-reassess on 5/29 for further adjustment.  HLD: On statin.  Right> left lower extremity edema: Edema has resolved-Doppler studies negative for DVT.  History of alcohol use: Claims no further alcohol use since hospitalization in April for CVA.  Resides at [facility].  Tobacco abuse: Counseled-on transdermal nicotine  Nutrition Status: Nutrition Problem: Moderate Malnutrition Etiology: chronic illness (CKD, stroke) Signs/Symptoms: mild fat depletion, moderate muscle depletion Interventions: Ensure Enlive (each supplement provides 350kcal and 20 grams of protein), MVI, Liberalize Diet, Refer to RD note for recommendations   BMI Estimated body mass index is 19.99 kg/m as calculated from the following:   Height as of this encounter:  5\' 7"  (1.702 m).   Weight as of this encounter: 57.9 kg.   Code status:   Code Status: Not on file   DVT Prophylaxis: SQ heparin  Family Communication: None at bedside   Disposition Plan: Status is: Inpatient Remains inpatient appropriate because: Likely progression to ESRD-will need initiation of HD.   Planned Discharge Destination:Skilled nursing  facility   Diet: Diet Order             Diet regular Room service appropriate? Yes; Fluid consistency: Thin; Fluid restriction: 1200 mL Fluid  Diet effective now                     Antimicrobial agents: Anti-infectives (From admission, onward)    Start     Dose/Rate Route Frequency Ordered Stop   12/25/21 1015  ceFAZolin (ANCEF) IVPB 2g/100 mL premix        2 g 200 mL/hr over 30 Minutes Intravenous To Radiology 12/25/21 0943 12/25/21 1116        MEDICATIONS: Scheduled Meds:  amLODipine  10 mg Oral Daily   aspirin EC  81 mg Oral Daily   atorvastatin  80 mg Oral QHS   Chlorhexidine Gluconate Cloth  6 each Topical Q0600   clopidogrel  75 mg Oral Daily   feeding supplement  237 mL Oral BID BM   heparin injection (subcutaneous)  5,000 Units Subcutaneous Q8H   labetalol  100 mg Oral BID   melatonin  3 mg Oral QHS   multivitamin  1 tablet Oral QHS   pantoprazole  40 mg Oral Q1200   pneumococcal 20-valent conjugate vaccine  0.5 mL Intramuscular Tomorrow-1000   Continuous Infusions:   PRN Meds:.hydrALAZINE, HYDROmorphone (DILAUDID) injection   I have personally reviewed following labs and imaging studies  LABORATORY DATA: CBC: Recent Labs  Lab 12/24/21 1846 12/25/21 0231 12/25/21 0722 12/25/21 1849 12/26/21 0450  WBC 9.0 8.6 8.5 8.1 11.8*  NEUTROABS 5.8  --   --   --   --   HGB 6.9* 6.6* 6.8* 11.0* 8.8*  HCT 21.0* 20.5* 20.6* 31.1* 25.2*  MCV 94.2 89.9 90.7 84.5 86.0  PLT 139* 124* 121* 117* 105*    Basic Metabolic Panel: Recent Labs  Lab 12/24/21 1846 12/25/21 0231 12/26/21 0450  NA 142  --  138  K 4.6  --  4.3  CL 117*  --  107  CO2 12*  --  18*  GLUCOSE 99  --  81  BUN 100*  --  67*  CREATININE 13.84*  --  10.24*  CALCIUM 8.1*  --  8.3*  MG  --  1.8  --   PHOS  --  8.3* 8.1*    GFR: Estimated Creatinine Clearance: 6.3 mL/min (A) (by C-G formula based on SCr of 10.24 mg/dL (H)).  Liver Function Tests: Recent Labs  Lab  12/24/21 1846 12/26/21 0450  AST 9*  --   ALT 8  --   ALKPHOS 90  --   BILITOT 0.5  --   PROT 6.8  --   ALBUMIN 2.4* 2.0*   No results for input(s): LIPASE, AMYLASE in the last 168 hours. No results for input(s): AMMONIA in the last 168 hours.  Coagulation Profile: Recent Labs  Lab 12/24/21 1846  INR 1.1    Cardiac Enzymes: Recent Labs  Lab 12/25/21 0231  CKTOTAL 119    BNP (last 3 results) No results for input(s): PROBNP in the last 8760 hours.  Lipid Profile: Recent Labs  12/25/21 0231  CHOL 140  HDL 30*  LDLCALC 85  TRIG 124  CHOLHDL 4.7    Thyroid Function Tests: Recent Labs    12/25/21 0231  TSH 2.965    Anemia Panel: Recent Labs    12/24/21 1840 12/24/21 1846  VITAMINB12  --  2,631*  FOLATE 15.7  --   FERRITIN  --  459*  TIBC  --  241*  IRON  --  69  RETICCTPCT 3.4*  --     Urine analysis: No results found for: COLORURINE, APPEARANCEUR, LABSPEC, PHURINE, GLUCOSEU, HGBUR, BILIRUBINUR, KETONESUR, PROTEINUR, UROBILINOGEN, NITRITE, LEUKOCYTESUR  Sepsis Labs: Lactic Acid, Venous No results found for: LATICACIDVEN  MICROBIOLOGY: No results found for this or any previous visit (from the past 240 hour(s)).  RADIOLOGY STUDIES/RESULTS: CT ABDOMEN PELVIS WO CONTRAST  Result Date: 12/25/2021 CLINICAL DATA:  Abdominal pain, acute, nonlocalized LUQ pain -Xray neg EXAM: CT ABDOMEN AND PELVIS WITHOUT CONTRAST TECHNIQUE: Multidetector CT imaging of the abdomen and pelvis was performed following the standard protocol without IV contrast. RADIATION DOSE REDUCTION: This exam was performed according to the departmental dose-optimization program which includes automated exposure control, adjustment of the mA and/or kV according to patient size and/or use of iterative reconstruction technique. COMPARISON:  None Available. FINDINGS: Lower chest: Bilateral pleural effusions, right larger than left. Compressive atelectasis in the lower lobes. Heart is normal  size. Hepatobiliary: No focal hepatic abnormality. Gallbladder unremarkable. Pancreas: No focal abnormality or ductal dilatation. Spleen: No focal abnormality.  Normal size. Adrenals/Urinary Tract: No adrenal abnormality. No focal renal abnormality. No stones or hydronephrosis. Urinary bladder is unremarkable. Stomach/Bowel: Normal appendix. Stomach, large and small bowel grossly unremarkable. Vascular/Lymphatic: Aortic atherosclerosis. No evidence of aneurysm or adenopathy. Reproductive: No visible focal abnormality. Other: Small to moderate free fluid in the pelvis. Musculoskeletal: No acute bony abnormality. IMPRESSION: Small to moderate free fluid in the pelvis of unknown etiology. Bilateral pleural effusions, right greater than left. Compressive atelectasis in the lower lobes. Aortic atherosclerosis. Electronically Signed   By: Rolm Baptise M.D.   On: 12/25/2021 21:10   IR Fluoro Guide CV Line Right  Result Date: 12/25/2021 INDICATION: Renal failure EXAM: Ultrasound and fluoroscopy guided tunneled right IJ hemodialysis catheter placement MEDICATIONS: Ancef 2 g IV; The antibiotic was administered within an appropriate time interval prior to skin puncture. ANESTHESIA/SEDATION: Moderate (conscious) sedation was employed during this procedure. A total of Versed 1 mg and Fentanyl 50 mcg was administered intravenously by the radiology nurse. Total intra-service moderate Sedation Time: 10 minutes. The patient's level of consciousness and vital signs were monitored continuously by radiology nursing throughout the procedure under my direct supervision. FLUOROSCOPY: Radiation Exposure Index (as provided by the fluoroscopic device): 0.6 mGy Kerma COMPLICATIONS: None immediate. PROCEDURE: Informed written consent was obtained from the patient after a thorough discussion of the procedural risks, benefits and alternatives. All questions were addressed. Maximal Sterile Barrier Technique was utilized including caps, mask,  sterile gowns, sterile gloves, sterile drape, hand hygiene and skin antiseptic. A timeout was performed prior to the initiation of the procedure. The right internal jugular vein was evaluated with ultrasound and shown to be patent. A permanent ultrasound image was obtained and placed in the patient's medical record. Using sterile gel and a sterile probe cover, the right internal jugular vein was entered with a 21 ga needle during real time ultrasound guidance. 0.018 inch guidewire placed and 21 ga needle exchanged for transitional dilator set. Utilizing fluoroscopy, 0.035 inch guidewire advanced through the needle without  difficulty. Seriel dilation was performed and peel-away sheath was placed. Attention then turned to the right anterior upper chest. Following local lidocaine administration, the hemodialysis catheter was tunneled from the chest wall to the venotomy site. The catheter was inserted through the peel-away sheath. The tip of the catheter was positioned within the right atrium using fluoroscopic guidance. All lumens of the catheter aspirated and flushed well. The dialysis lumens were locked with Heparin. The catheter was secured to the skin with suture. The insertion site was covered with sterile dressing. IMPRESSION: 19 cm right IJ tunneled hemodialysis catheter is ready for use. Electronically Signed   By: Miachel Roux M.D.   On: 12/25/2021 15:10   IR US Guide Vasc Access Right  Result Date: 12/25/2021 INDICATION: Renal failure EXAM: Ultrasound and fluoroscopy guided tunneled right IJ hemodialysis catheter placement MEDICATIONS: Ancef 2 g IV; The antibiotic was administered within an appropriate time interval prior to skin puncture. ANESTHESIA/SEDATION: Moderate (conscious) sedation was employed during this procedure. A total of Versed 1 mg and Fentanyl 50 mcg was administered intravenously by the radiology nurse. Total intra-service moderate Sedation Time: 10 minutes. The patient's level of  consciousness and vital signs were monitored continuously by radiology nursing throughout the procedure under my direct supervision. FLUOROSCOPY: Radiation Exposure Index (as provided by the fluoroscopic device): 0.6 mGy Kerma COMPLICATIONS: None immediate. PROCEDURE: Informed written consent was obtained from the patient after a thorough discussion of the procedural risks, benefits and alternatives. All questions were addressed. Maximal Sterile Barrier Technique was utilized including caps, mask, sterile gowns, sterile gloves, sterile drape, hand hygiene and skin antiseptic. A timeout was performed prior to the initiation of the procedure. The right internal jugular vein was evaluated with ultrasound and shown to be patent. A permanent ultrasound image was obtained and placed in the patient's medical record. Using sterile gel and a sterile probe cover, the right internal jugular vein was entered with a 21 ga needle during real time ultrasound guidance. 0.018 inch guidewire placed and 21 ga needle exchanged for transitional dilator set. Utilizing fluoroscopy, 0.035 inch guidewire advanced through the needle without difficulty. Seriel dilation was performed and peel-away sheath was placed. Attention then turned to the right anterior upper chest. Following local lidocaine administration, the hemodialysis catheter was tunneled from the chest wall to the venotomy site. The catheter was inserted through the peel-away sheath. The tip of the catheter was positioned within the right atrium using fluoroscopic guidance. All lumens of the catheter aspirated and flushed well. The dialysis lumens were locked with Heparin. The catheter was secured to the skin with suture. The insertion site was covered with sterile dressing. IMPRESSION: 19 cm right IJ tunneled hemodialysis catheter is ready for use. Electronically Signed   By: Miachel Roux M.D.   On: 12/25/2021 15:10   VAS [place] LOWER EXTREMITY VENOUS (DVT)  Result Date:  12/26/2021  Lower Venous DVT Study Patient Name:  TAEVION SIKORA  Date of Exam:   12/26/2021 Medical Rec #: [id number]         Accession #:    [id number] Date of Birth: 10/09/1960         Patient Gender: M Patient Age:   43 years Exam Location:  [facility] Procedure:      VAS [place] LOWER EXTREMITY VENOUS (DVT) Referring Phys: Nyoka Lint DOUTOVA --------------------------------------------------------------------------------  Indications: Swelling.  Comparison Study: No prior studies. Performing Technologist: Darlin Coco RDMS, RVT  Examination Guidelines: A complete evaluation includes B-mode imaging, spectral Doppler, color Doppler,  and power Doppler as needed of all accessible portions of each vessel. Bilateral testing is considered an integral part of a complete examination. Limited examinations for reoccurring indications may be performed as noted. The reflux portion of the exam is performed with the patient in reverse Trendelenburg.  +---------+---------------+---------+-----------+----------+--------------+ RIGHT    CompressibilityPhasicitySpontaneityPropertiesThrombus Aging +---------+---------------+---------+-----------+----------+--------------+ CFV      Full           Yes      Yes                                 +---------+---------------+---------+-----------+----------+--------------+ SFJ      Full                                                        +---------+---------------+---------+-----------+----------+--------------+ FV Prox  Full                                                        +---------+---------------+---------+-----------+----------+--------------+ FV Mid   Full                                                        +---------+---------------+---------+-----------+----------+--------------+ FV DistalFull                                                        +---------+---------------+---------+-----------+----------+--------------+  PFV      Full                                                        +---------+---------------+---------+-----------+----------+--------------+ POP      Full           Yes      Yes                                 +---------+---------------+---------+-----------+----------+--------------+ PTV      Full                                                        +---------+---------------+---------+-----------+----------+--------------+ PERO     Full                                                        +---------+---------------+---------+-----------+----------+--------------+   +---------+---------------+---------+-----------+----------+--------------+  LEFT     CompressibilityPhasicitySpontaneityPropertiesThrombus Aging +---------+---------------+---------+-----------+----------+--------------+ CFV      Full           Yes      Yes                                 +---------+---------------+---------+-----------+----------+--------------+ SFJ      Full                                                        +---------+---------------+---------+-----------+----------+--------------+ FV Prox  Full                                                        +---------+---------------+---------+-----------+----------+--------------+ FV Mid   Full                                                        +---------+---------------+---------+-----------+----------+--------------+ FV DistalFull                                                        +---------+---------------+---------+-----------+----------+--------------+ PFV      Full                                                        +---------+---------------+---------+-----------+----------+--------------+ POP      Full           Yes      Yes                                 +---------+---------------+---------+-----------+----------+--------------+ PTV      Full                                                         +---------+---------------+---------+-----------+----------+--------------+ PERO     Full                                                        +---------+---------------+---------+-----------+----------+--------------+     Summary: RIGHT: - There is no evidence of deep vein thrombosis in the lower extremity.  - No cystic structure found in the popliteal fossa.  LEFT: - There is no evidence of deep vein thrombosis in the lower extremity.  - No  cystic structure found in the popliteal fossa.  *See table(s) above for measurements and observations.    Preliminary      LOS: 3 days   Oren Binet, MD  [facility]    To contact the attending provider between 7A-7P or the covering provider during after hours 7P-7A, please log into the web site [email or web address] and access using universal [place] password for that web site. If you do not have the password, please call the hospital operator.  12/27/2021, 10:23 AM

## 2021-12-28 ENCOUNTER — Inpatient Hospital Stay (HOSPITAL_COMMUNITY): Payer: Medicaid Other

## 2021-12-28 DIAGNOSIS — R600 Localized edema: Secondary | ICD-10-CM | POA: Diagnosis not present

## 2021-12-28 DIAGNOSIS — D649 Anemia, unspecified: Secondary | ICD-10-CM | POA: Diagnosis not present

## 2021-12-28 DIAGNOSIS — Z720 Tobacco use: Secondary | ICD-10-CM | POA: Diagnosis not present

## 2021-12-28 DIAGNOSIS — N179 Acute kidney failure, unspecified: Secondary | ICD-10-CM | POA: Diagnosis not present

## 2021-12-28 LAB — CBC
HCT: 21.5 % — ABNORMAL LOW (ref 39.0–52.0)
HCT: 32.4 % — ABNORMAL LOW (ref 39.0–52.0)
Hemoglobin: 7 g/dL — ABNORMAL LOW (ref 13.0–17.0)
Hemoglobin: 11.5 g/dL — ABNORMAL LOW (ref 13.0–17.0)
MCH: 28.8 pg (ref 26.0–34.0)
MCH: 30.2 pg (ref 26.0–34.0)
MCHC: 32.6 g/dL (ref 30.0–36.0)
MCHC: 35.5 g/dL (ref 30.0–36.0)
MCV: 88.5 fL (ref 80.0–100.0)
MCV: 85 fL (ref 80.0–100.0)
Platelets: 98 10*3/uL — ABNORMAL LOW (ref 150–400)
Platelets: 114 10*3/uL — ABNORMAL LOW (ref 150–400)
RBC: 2.43 MIL/uL — ABNORMAL LOW (ref 4.22–5.81)
RBC: 3.81 MIL/uL — ABNORMAL LOW (ref 4.22–5.81)
RDW: 16.6 % — ABNORMAL HIGH (ref 11.5–15.5)
RDW: 15.6 % — ABNORMAL HIGH (ref 11.5–15.5)
WBC: 8 10*3/uL (ref 4.0–10.5)
WBC: 7 10*3/uL (ref 4.0–10.5)
nRBC: 0 % (ref 0.0–0.2)
nRBC: 0 % (ref 0.0–0.2)

## 2021-12-28 LAB — RENAL FUNCTION PANEL
Albumin: 1.9 g/dL — ABNORMAL LOW (ref 3.5–5.0)
Anion gap: 6 (ref 5–15)
BUN: 62 mg/dL — ABNORMAL HIGH (ref 6–20)
CO2: 24 mmol/L (ref 22–32)
Calcium: 8 mg/dL — ABNORMAL LOW (ref 8.9–10.3)
Chloride: 106 mmol/L (ref 98–111)
Creatinine, Ser: 8.59 mg/dL — ABNORMAL HIGH (ref 0.61–1.24)
GFR, Estimated: 7 mL/min — ABNORMAL LOW (ref >60)
Glucose, Bld: 115 mg/dL — ABNORMAL HIGH (ref 70–99)
Phosphorus: 5.7 mg/dL — ABNORMAL HIGH (ref 2.5–4.6)
Potassium: 4.2 mmol/L (ref 3.5–5.1)
Sodium: 136 mmol/L (ref 135–145)

## 2021-12-28 LAB — CBC WITH DIFFERENTIAL/PLATELET
Abs Immature Granulocytes: 0.05 10*3/uL (ref 0.00–0.07)
Basophils Absolute: 0.1 10*3/uL (ref 0.0–0.1)
Basophils Relative: 1 %
Eosinophils Absolute: 0.3 10*3/uL (ref 0.0–0.5)
Eosinophils Relative: 3 %
HCT: 31 % — ABNORMAL LOW (ref 39.0–52.0)
Hemoglobin: 10.2 g/dL — ABNORMAL LOW (ref 13.0–17.0)
Immature Granulocytes: 1 %
Lymphocytes Relative: 6 %
Lymphs Abs: 0.5 10*3/uL — ABNORMAL LOW (ref 0.7–4.0)
MCH: 29.7 pg (ref 26.0–34.0)
MCHC: 32.9 g/dL (ref 30.0–36.0)
MCV: 90.1 fL (ref 80.0–100.0)
Monocytes Absolute: 0.4 10*3/uL (ref 0.1–1.0)
Monocytes Relative: 4 %
Neutro Abs: 7.2 10*3/uL (ref 1.7–7.7)
Neutrophils Relative %: 85 %
Platelets: 137 10*3/uL — ABNORMAL LOW (ref 150–400)
RBC: 3.44 MIL/uL — ABNORMAL LOW (ref 4.22–5.81)
RDW: 15.9 % — ABNORMAL HIGH (ref 11.5–15.5)
WBC: 8.5 10*3/uL (ref 4.0–10.5)
nRBC: 0 % (ref 0.0–0.2)

## 2021-12-28 LAB — PREPARE RBC (CROSSMATCH)

## 2021-12-28 LAB — LACTIC ACID, PLASMA: Lactic Acid, Venous: 1 mmol/L (ref 0.5–1.9)

## 2021-12-28 MED ORDER — HEPARIN SODIUM (PORCINE) 1000 UNIT/ML IJ SOLN
INTRAMUSCULAR | Status: AC
  Administered 2021-12-28: 1000 [IU]
  Filled 2021-12-28: qty 4

## 2021-12-28 MED ORDER — ACETAMINOPHEN 325 MG PO TABS
325.0000 mg | ORAL_TABLET | Freq: Once | ORAL | Status: AC
  Administered 2021-12-28: 325 mg via ORAL
  Filled 2021-12-28: qty 1

## 2021-12-28 MED ORDER — HYDRALAZINE HCL 20 MG/ML IJ SOLN
5.0000 mg | Freq: Once | INTRAMUSCULAR | Status: AC
  Administered 2021-12-28: 5 mg via INTRAVENOUS
  Filled 2021-12-28: qty 1

## 2021-12-28 MED ORDER — ACETAMINOPHEN 325 MG PO TABS
650.0000 mg | ORAL_TABLET | Freq: Once | ORAL | Status: AC
  Administered 2021-12-28: 650 mg via ORAL
  Filled 2021-12-28: qty 2

## 2021-12-28 MED ORDER — LABETALOL HCL 200 MG PO TABS
200.0000 mg | ORAL_TABLET | Freq: Two times a day (BID) | ORAL | Status: DC
  Administered 2021-12-28 – 2021-12-29 (×4): 200 mg via ORAL
  Filled 2021-12-28 (×5): qty 1

## 2021-12-28 MED ORDER — HYDRALAZINE HCL 20 MG/ML IJ SOLN
10.0000 mg | Freq: Once | INTRAMUSCULAR | Status: AC
  Administered 2021-12-28: 10 mg via INTRAVENOUS
  Filled 2021-12-28: qty 1

## 2021-12-28 MED ORDER — SODIUM CHLORIDE 0.9% IV SOLUTION: Freq: Once | INTRAVENOUS | Status: AC

## 2021-12-28 MED ORDER — DARBEPOETIN ALFA 100 MCG/0.5ML IJ SOSY
100.0000 ug | PREFILLED_SYRINGE | INTRAMUSCULAR | Status: DC
  Filled 2021-12-28: qty 0.5

## 2021-12-28 MED ORDER — SEVELAMER CARBONATE 800 MG PO TABS
800.0000 mg | ORAL_TABLET | Freq: Three times a day (TID) | ORAL | Status: DC
  Administered 2021-12-28 – 2022-01-03 (×18): 800 mg via ORAL
  Filled 2021-12-28 (×17): qty 1

## 2021-12-28 MED ORDER — DIPHENHYDRAMINE HCL 50 MG/ML IJ SOLN
25.0000 mg | Freq: Once | INTRAMUSCULAR | Status: AC
  Administered 2021-12-28: 25 mg via INTRAVENOUS
  Filled 2021-12-28: qty 1

## 2021-12-28 MED ORDER — FUROSEMIDE 10 MG/ML IJ SOLN: 20.0000 mg | Freq: Once | INTRAMUSCULAR | Status: DC

## 2021-12-28 NOTE — Evaluation (Signed)
Physical Therapy Evaluation Patient Details Name: Cody Macdonald MRN: [id number] DOB: July 29, 1961 Today's Date: 12/28/2021  History of Present Illness  61 y.o. male Presented with  abnormal labs--low hemoglobin and acute on chronic renal failure.  PMH significant of CKD, CVA, HTN, Etoh abuse tobacco abuse, anemia. Resident at SNF x ~1 month s/p CVA; non-ambulatory  Clinical Impression   Pt admitted secondary to problem above with deficits below. PTA patient was using a wheelchair for locomotion at SNF where he was undergoing therapy for recent CVA. Patient reports conflicting information re: potential gait training--was not working on walking vs was working on walking with a cane.  Pt currently requires up to mod assist for bed mobility and supervision for lateral scoot transfer. Patient refused to attempt standing at this time--fearful of falling despite +2 assist available. Notes he does want to get back to walking.  Anticipate patient will benefit from PT to address problems listed below.Will continue to follow acutely to maximize functional mobility independence and safety.          Recommendations for follow up therapy are one component of a multi-disciplinary discharge planning process, led by the attending physician.  Recommendations may be updated based on patient status, additional functional criteria and insurance authorization.  Follow Up Recommendations Skilled nursing-short term rehab (<3 hours/day)    Assistance Recommended at Discharge Intermittent Supervision/Assistance  Patient can return home with the following  A little help with walking and/or transfers;Assistance with cooking/housework    Equipment Recommendations None recommended by PT  Recommendations for Other Services       Functional Status Assessment Patient has had a recent decline in their functional status and demonstrates the ability to make significant improvements in function in a reasonable and predictable  amount of time.     Precautions / Restrictions Precautions Precautions: Fall Restrictions Weight Bearing Restrictions: No      Mobility  Bed Mobility Overal bed mobility: Needs Assistance Bed Mobility: Supine to Sit, Sit to Supine     Supine to sit: Mod assist Sit to supine: Modified independent (Device/Increase time)   General bed mobility comments: with rail; assist to move legs over EOB and to raise torso; able to use momentum and lift LEs up onto mattress with return to supine    Transfers Overall transfer level: Needs assistance Equipment used: None Transfers: Bed to chair/wheelchair/BSC            Lateral/Scoot Transfers: Supervision, From elevated surface General transfer comment: pt refused to attempt standing despite presence of 2 therapists; attempted lateral scoot along EOB to his left with great difficulty and little progress; bed then placed in trendelenburg and pt able to laterally scoot twice with close supervision    Ambulation/Gait               General Gait Details: pt refused to attempt  Stairs            Wheelchair Mobility    Modified Rankin (Stroke Patients Only) Modified Rankin (Stroke Patients Only) Pre-Morbid Rankin Score: Severe disability Modified Rankin: Severe disability     Balance Overall balance assessment: Needs assistance Sitting-balance support: No upper extremity supported, Feet supported Sitting balance-Leahy Scale: Good Sitting balance - Comments: able to reach down to his rt foot for donning sock  Pertinent Vitals/Pain Pain Assessment Pain Assessment: Faces Faces Pain Scale: Hurts little more Pain Location: LUE with PROM Pain Descriptors / Indicators: Discomfort, Guarding Pain Intervention(s): Limited activity within patient's tolerance, Monitored during session    Home Living Family/patient expects to be discharged to:: Skilled nursing facility                    Additional Comments: patient's long-term goal is to return home    Prior Function Prior Level of Function : Needs assist             Mobility Comments: using wheelchair at SNF; transferring modified independent; not working on walking (all per patient) ADLs Comments: reports he was bathing and dressing himself at SNF     Hand Dominance   Dominant Hand: Right    Extremity/Trunk Assessment   Upper Extremity Assessment Upper Extremity Assessment: Defer to OT evaluation    Lower Extremity Assessment Lower Extremity Assessment: LLE deficits/detail LLE Deficits / Details: pt able to lift against gravity; knee extension 3/5, ankle DF 1+, toe extension 2-    Cervical / Trunk Assessment Cervical / Trunk Assessment: Other exceptions Cervical / Trunk Exceptions: left trunk weakness with slight rt lateral curvature  Communication   Communication: No difficulties  Cognition Arousal/Alertness: Awake/alert Behavior During Therapy: Flat affect Overall Cognitive Status: Within Functional Limits for tasks assessed                                          General Comments      Exercises     Assessment/Plan    PT Assessment Patient needs continued PT services  PT Problem List Decreased strength;Decreased balance;Decreased mobility;Decreased knowledge of use of DME;Pain       PT Treatment Interventions DME instruction;Gait training;Functional mobility training;Therapeutic activities;Therapeutic exercise;Balance training;Neuromuscular re-education;Patient/family education    PT Goals (Current goals can be found in the Care Plan section)  Acute Rehab PT Goals Patient Stated Goal: wants to be able to walk prior to returning home PT Goal Formulation: With patient Time For Goal Achievement: 01/11/22 Potential to Achieve Goals: Fair    Frequency Min 2X/week     Co-evaluation PT/OT/SLP Co-Evaluation/Treatment: Yes Reason for Co-Treatment:  Complexity of the patient's impairments (multi-system involvement) PT goals addressed during session: Mobility/safety with mobility;Balance         AM-PAC PT "6 Clicks" Mobility  Outcome Measure Help needed turning from your back to your side while in a flat bed without using bedrails?: A Lot Help needed moving from lying on your back to sitting on the side of a flat bed without using bedrails?: A Lot Help needed moving to and from a bed to a chair (including a wheelchair)?: A Little Help needed standing up from a chair using your arms (e.g., wheelchair or bedside chair)?: A Lot Help needed to walk in hospital room?: Total Help needed climbing 3-5 steps with a railing? : Total 6 Click Score: 11    End of Session Equipment Utilized During Treatment:  (pt refused gait belt and attempting standing) Activity Tolerance: Patient tolerated treatment well Patient left: in bed;with call bell/phone within reach;with bed alarm set Nurse Communication: Mobility status PT Visit Diagnosis: Hemiplegia and hemiparesis Hemiplegia - Right/Left: Left Hemiplegia - dominant/non-dominant: Non-dominant Hemiplegia - caused by: Cerebral infarction    Time: 1740-8144 PT Time Calculation (min) (ACUTE ONLY): 21 min   Charges:  PT Evaluation $PT Eval Low Complexity: Faulk, PT Acute Rehabilitation Services  Pager [phone] Office [phone]   Rexanne Mano 12/28/2021, 12:02 PM

## 2021-12-28 NOTE — Progress Notes (Signed)
Patient ID: Cody Macdonald, male   DOB: 03/15/1961, 61 y.o.   MRN: [id number] [facility] Progress Note   Assessment/ Plan:   1.  Acute kidney injury on chronic kidney disease- Unclear if this is evolution to ESRD: Suspected to have had progression of chronic kidney disease and with development of uremic symptoms and started on hemodialysis 5/26 with second treatment on 5/27.  Unclear if this is end-stage renal disease or if he will have meaningful recovery of renal function.  The process is underway for OP HD unit placement (which will likely not be locally @[facility]  units due to insurance issues per renal navigator note of 5/26).  Will maintain HD on MWF schedule this week 2.  Symptomatic anemia: Unclear if he had any overt losses-patient does not report noticing any.  receiving another prbc transfusion today. Iron replete on labs, will start ESA 3.  History of CVA: on DAPT, per primary 4.  Hypertension: Resume oral antihypertensive agents and monitor with ultrafiltration/hemodialysis. 5.  Chronic kidney disease-metabolic bone disease: Continue to monitor calcium and phosphorus levels, corrected cal WNL. will start renvela 6.  Nutrition: Likely compromised by advancing chronic kidney disease and history of alcohol use.  Monitor on ONS. Push protein  Subjective:   Denies any acute events overnight, no complaints today. Receiving 1u prbc given downtrending hgb.   Objective:   BP (!) 167/94   Pulse 80   Temp 98.3 F (36.8 C) (Oral)   Resp 14   Ht 5\' 7"  (1.702 m)   Wt 57.9 kg   SpO2 98%   BMI 19.99 kg/m   Intake/Output Summary (Last 24 hours) at 12/28/2021 1234 Last data filed at 12/28/2021 2671 Gross per 24 hour  Intake 240 ml  Output 200 ml  Net 40 ml   Weight change:   Physical Exam: Gen: Comfortably resting in bed, nad CVS: RRR, S1S2 Resp: CTA bl, unlabored IWP:YKDX, NT IPJ:ASNKN LE edema Neuro: awake, alert Dialysis access: [facility]  Imaging: VAS [place] LOWER  EXTREMITY VENOUS (DVT)  Result Date: 12/27/2021  Lower Venous DVT Study Patient Name:  Cody Macdonald  Date of Exam:   12/26/2021 Medical Rec #: [id number]         Accession #:    [id number] Date of Birth: 06-Oct-1960         Patient Gender: M Patient Age:   89 years Exam Location:  [facility] Procedure:      VAS [place] LOWER EXTREMITY VENOUS (DVT) Referring Phys: Nyoka Lint DOUTOVA --------------------------------------------------------------------------------  Indications: Swelling.  Comparison Study: No prior studies. Performing Technologist: Darlin Coco RDMS, RVT  Examination Guidelines: A complete evaluation includes B-mode imaging, spectral Doppler, color Doppler, and power Doppler as needed of all accessible portions of each vessel. Bilateral testing is considered an integral part of a complete examination. Limited examinations for reoccurring indications may be performed as noted. The reflux portion of the exam is performed with the patient in reverse Trendelenburg.  +---------+---------------+---------+-----------+----------+--------------+ RIGHT    CompressibilityPhasicitySpontaneityPropertiesThrombus Aging +---------+---------------+---------+-----------+----------+--------------+ CFV      Full           Yes      Yes                                 +---------+---------------+---------+-----------+----------+--------------+ SFJ      Full                                                        +---------+---------------+---------+-----------+----------+--------------+  FV Prox  Full                                                        +---------+---------------+---------+-----------+----------+--------------+ FV Mid   Full                                                        +---------+---------------+---------+-----------+----------+--------------+ FV DistalFull                                                         +---------+---------------+---------+-----------+----------+--------------+ PFV      Full                                                        +---------+---------------+---------+-----------+----------+--------------+ POP      Full           Yes      Yes                                 +---------+---------------+---------+-----------+----------+--------------+ PTV      Full                                                        +---------+---------------+---------+-----------+----------+--------------+ PERO     Full                                                        +---------+---------------+---------+-----------+----------+--------------+   +---------+---------------+---------+-----------+----------+--------------+ LEFT     CompressibilityPhasicitySpontaneityPropertiesThrombus Aging +---------+---------------+---------+-----------+----------+--------------+ CFV      Full           Yes      Yes                                 +---------+---------------+---------+-----------+----------+--------------+ SFJ      Full                                                        +---------+---------------+---------+-----------+----------+--------------+ FV Prox  Full                                                        +---------+---------------+---------+-----------+----------+--------------+  FV Mid   Full                                                        +---------+---------------+---------+-----------+----------+--------------+ FV DistalFull                                                        +---------+---------------+---------+-----------+----------+--------------+ PFV      Full                                                        +---------+---------------+---------+-----------+----------+--------------+ POP      Full           Yes      Yes                                  +---------+---------------+---------+-----------+----------+--------------+ PTV      Full                                                        +---------+---------------+---------+-----------+----------+--------------+ PERO     Full                                                        +---------+---------------+---------+-----------+----------+--------------+     Summary: RIGHT: - There is no evidence of deep vein thrombosis in the lower extremity.  - No cystic structure found in the popliteal fossa.  LEFT: - There is no evidence of deep vein thrombosis in the lower extremity.  - No cystic structure found in the popliteal fossa.  *See table(s) above for measurements and observations. Electronically signed by Servando Snare MD on 12/27/2021 at 4:25:48 PM.    Final     Labs: BMET Recent Labs  Lab 12/24/21 1846 12/25/21 0231 12/26/21 0450 12/28/21 0059  NA 142  --  138 136  K 4.6  --  4.3 4.2  CL 117*  --  107 106  CO2 12*  --  18* 24  GLUCOSE 99  --  81 115*  BUN 100*  --  67* 62*  CREATININE 13.84*  --  10.24* 8.59*  CALCIUM 8.1*  --  8.3* 8.0*  PHOS  --  8.3* 8.1* 5.7*   CBC Recent Labs  Lab 12/24/21 1846 12/25/21 0231 12/25/21 0722 12/25/21 1849 12/26/21 0450 12/28/21 0059  WBC 9.0   < > 8.5 8.1 11.8* 8.0  NEUTROABS 5.8  --   --   --   --   --   HGB 6.9*   < > 6.8* 11.0* 8.8* 7.0*  HCT 21.0*   < > 20.6* 31.1*  25.2* 21.5*  MCV 94.2   < > 90.7 84.5 86.0 88.5  PLT 139*   < > 121* 117* 105* 98*   < > = values in this interval not displayed.    Medications:     amLODipine  10 mg Oral Daily   aspirin EC  81 mg Oral Daily   atorvastatin  80 mg Oral QHS   Chlorhexidine Gluconate Cloth  6 each Topical Q0600   clopidogrel  75 mg Oral Daily   feeding supplement  237 mL Oral BID BM   heparin injection (subcutaneous)  5,000 Units Subcutaneous Q8H   labetalol  200 mg Oral BID   melatonin  3 mg Oral QHS   multivitamin  1 tablet Oral QHS   pantoprazole  40 mg Oral  Q1200   pneumococcal 20-valent conjugate vaccine  0.5 mL Intramuscular Tomorrow-1000   Gean Quint, MD [facility] 12/28/2021, 12:34 PM

## 2021-12-28 NOTE — Progress Notes (Signed)
PROGRESS NOTE        PATIENT DETAILS Name: Cody Macdonald Age: 61 y.o. Sex: male Date of Birth: [birth year] Admit Date: 12/24/2021 Admitting Physician Toy Baker, MD PCP:Pcp, No  Brief Summary: Patient is a 61 y.o.  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  See below for further details.  Significant events: 5/25>> referred to ED from [facility]-progressive renal failure-concern for ESRD, severe anemia.  Admit to [facility].  Significant studies: 5/25>> CXR: Small right pleural effusion-right basilar subsegmental atelectasis 5/26>> CT abdomen: Small to moderate free fluid in the pelvis, bilateral pleural effusions.  No evidence of SBO. 5/27>> bilateral lower extremity Doppler: No DVT.  Significant microbiology data:   Procedures:   Consults: Nephrology, IR  Subjective: Lying comfortably in bed-no abdominal pain.  Objective: Vitals: Blood pressure (!) 191/91, pulse 80, temperature 98.6 F (37 C), temperature source Oral, resp. rate 12, height 5\' 7"  (1.702 m), weight 57.9 kg, SpO2 98 %.   Exam: Gen Exam:Alert awake-not in any distress HEENT:atraumatic, normocephalic Chest: B/L clear to auscultation anteriorly CVS:S1S2 regular Abdomen:soft non tender, non distended Extremities:no edema Neurology: Left-sided hemiplegia.  l Skin: no rash   Pertinent Labs/Radiology:    Latest Ref Rng & Units 12/28/2021   12:59 AM 12/26/2021    4:50 AM 12/25/2021    6:49 PM  CBC  WBC 4.0 - 10.5 K/uL 8.0   11.8   8.1    Hemoglobin 13.0 - 17.0 g/dL 7.0   8.8   11.0    Hematocrit 39.0 - 52.0 % 21.5   25.2   31.1    Platelets 150 - 400 K/uL 98   105   117      Lab Results  Component Value Date   NA 136 12/28/2021   K 4.2 12/28/2021   CL 106 12/28/2021   CO2 24 12/28/2021      Assessment/Plan: AKI on CKD stage  IV-likely progression to ESRD: Started on HD this admit-tolerating well so far-nephrology following-arrangements being made for for outpatient HD.    Normocytic anemia: Likely due to CKD-no evidence of blood loss (brown stools yesterday per patient).  Hemoglobin slowly trending down-we will transfuse 1 more unit of PRBC with HD today.  Defer Aranesp/IV iron to nephrology service.  Continue to follow CBC.    Abdominal pain: No further abdominal pain-CT abdomen on 5/26 without any significant abnormalities.    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   Per patient-he was discharged from [facility] to SNF-where he still resides.  He has not ambulated and is mostly wheelchair-bound.  HTN: BP remains on the higher side-increase labetalol to 200 mg twice daily, continue Norvasc.  Follow and adjust.   HLD: On statin.  Right> left lower extremity edema: Edema has resolved-Doppler studies negative for DVT.  History of alcohol use: Claims no further alcohol use since hospitalization in April for CVA.  Resides at [facility].  Tobacco abuse: Counseled-on transdermal nicotine  Nutrition Status: Nutrition Problem: Moderate Malnutrition Etiology: chronic illness (CKD, stroke) Signs/Symptoms: mild fat depletion, moderate muscle depletion Interventions: Ensure Enlive (each supplement provides 350kcal and 20 grams of protein), MVI, Liberalize Diet, Refer to RD note for recommendations   BMI Estimated body mass index  is 19.99 kg/m as calculated from the following:   Height as of this encounter: 5\' 7"  (1.702 m).   Weight as of this encounter: 57.9 kg.   Code status:   Code Status: Not on file   DVT Prophylaxis: SQ heparin  Family Communication: None at bedside   Disposition Plan: Status is: Inpatient Remains inpatient appropriate because: Likely progression to ESRD-started on HD this admission-clipping process in progress.    Planned Discharge Destination:Skilled nursing facility   Diet: Diet Order             Diet regular Room service appropriate? Yes; Fluid consistency: Thin; Fluid restriction: 1200 mL Fluid  Diet effective now                     Antimicrobial agents: Anti-infectives (From admission, onward)    Start     Dose/Rate Route Frequency Ordered Stop   12/25/21 1015  ceFAZolin (ANCEF) IVPB 2g/100 mL premix        2 g 200 mL/hr over 30 Minutes Intravenous To Radiology 12/25/21 0943 12/25/21 1116        MEDICATIONS: Scheduled Meds:  sodium chloride   Intravenous Once   acetaminophen  650 mg Oral Once   amLODipine  10 mg Oral Daily   aspirin EC  81 mg Oral Daily   atorvastatin  80 mg Oral QHS   Chlorhexidine Gluconate Cloth  6 each Topical Q0600   clopidogrel  75 mg Oral Daily   diphenhydrAMINE  25 mg Intravenous Once   feeding supplement  237 mL Oral BID BM   furosemide  20 mg Intravenous Once   heparin injection (subcutaneous)  5,000 Units Subcutaneous Q8H   labetalol  100 mg Oral BID   melatonin  3 mg Oral QHS   multivitamin  1 tablet Oral QHS   pantoprazole  40 mg Oral Q1200   pneumococcal 20-valent conjugate vaccine  0.5 mL Intramuscular Tomorrow-1000   Continuous Infusions:   PRN Meds:.hydrALAZINE, HYDROmorphone (DILAUDID) injection   I have personally reviewed following labs and imaging studies  LABORATORY DATA: CBC: Recent Labs  Lab 12/24/21 1846 12/25/21 0231 12/25/21 0722 12/25/21 1849 12/26/21 0450 12/28/21 0059  WBC 9.0 8.6 8.5 8.1 11.8* 8.0  NEUTROABS 5.8  --   --   --   --   --   HGB 6.9* 6.6* 6.8* 11.0* 8.8* 7.0*  HCT 21.0* 20.5* 20.6* 31.1* 25.2* 21.5*  MCV 94.2 89.9 90.7 84.5 86.0 88.5  PLT 139* 124* 121* 117* 105* 98*     Basic Metabolic Panel: Recent Labs  Lab 12/24/21 1846 12/25/21 0231 12/26/21 0450 12/28/21 0059  NA 142  --  138 136  K 4.6  --  4.3 4.2  CL 117*  --  107 106  CO2 12*  --  18* 24  GLUCOSE 99  --  81 115*   BUN 100*  --  67* 62*  CREATININE 13.84*  --  10.24* 8.59*  CALCIUM 8.1*  --  8.3* 8.0*  MG  --  1.8  --   --   PHOS  --  8.3* 8.1* 5.7*     GFR: Estimated Creatinine Clearance: 7.5 mL/min (A) (by C-G formula based on SCr of 8.59 mg/dL (H)).  Liver Function Tests: Recent Labs  Lab 12/24/21 1846 12/26/21 0450 12/28/21 0059  AST 9*  --   --   ALT 8  --   --   ALKPHOS 90  --   --  BILITOT 0.5  --   --   PROT 6.8  --   --   ALBUMIN 2.4* 2.0* 1.9*    No results for input(s): LIPASE, AMYLASE in the last 168 hours. No results for input(s): AMMONIA in the last 168 hours.  Coagulation Profile: Recent Labs  Lab 12/24/21 1846  INR 1.1     Cardiac Enzymes: Recent Labs  Lab 12/25/21 0231  CKTOTAL 119     BNP (last 3 results) No results for input(s): PROBNP in the last 8760 hours.  Lipid Profile: No results for input(s): CHOL, HDL, LDLCALC, TRIG, CHOLHDL, LDLDIRECT in the last 72 hours.   Thyroid Function Tests: No results for input(s): TSH, T4TOTAL, FREET4, T3FREE, THYROIDAB in the last 72 hours.   Anemia Panel: No results for input(s): VITAMINB12, FOLATE, FERRITIN, TIBC, IRON, RETICCTPCT in the last 72 hours.   Urine analysis: No results found for: COLORURINE, APPEARANCEUR, LABSPEC, PHURINE, GLUCOSEU, HGBUR, BILIRUBINUR, KETONESUR, PROTEINUR, UROBILINOGEN, NITRITE, LEUKOCYTESUR  Sepsis Labs: Lactic Acid, Venous No results found for: LATICACIDVEN  MICROBIOLOGY: No results found for this or any previous visit (from the past 240 hour(s)).  RADIOLOGY STUDIES/RESULTS: VAS [place] LOWER EXTREMITY VENOUS (DVT)  Result Date: 12/27/2021  Lower Venous DVT Study Patient Name:  Cody Macdonald  Date of Exam:   12/26/2021 Medical Rec #: [id number]         Accession #:    [id number] Date of Birth: September 09, 1960         Patient Gender: M Patient Age:   65 years Exam Location:  [facility] Procedure:      VAS [place] LOWER EXTREMITY VENOUS (DVT) Referring Phys: Nyoka Lint  DOUTOVA --------------------------------------------------------------------------------  Indications: Swelling.  Comparison Study: No prior studies. Performing Technologist: Darlin Coco RDMS, RVT  Examination Guidelines: A complete evaluation includes B-mode imaging, spectral Doppler, color Doppler, and power Doppler as needed of all accessible portions of each vessel. Bilateral testing is considered an integral part of a complete examination. Limited examinations for reoccurring indications may be performed as noted. The reflux portion of the exam is performed with the patient in reverse Trendelenburg.  +---------+---------------+---------+-----------+----------+--------------+ RIGHT    CompressibilityPhasicitySpontaneityPropertiesThrombus Aging +---------+---------------+---------+-----------+----------+--------------+ CFV      Full           Yes      Yes                                 +---------+---------------+---------+-----------+----------+--------------+ SFJ      Full                                                        +---------+---------------+---------+-----------+----------+--------------+ FV Prox  Full                                                        +---------+---------------+---------+-----------+----------+--------------+ FV Mid   Full                                                        +---------+---------------+---------+-----------+----------+--------------+  FV DistalFull                                                        +---------+---------------+---------+-----------+----------+--------------+ PFV      Full                                                        +---------+---------------+---------+-----------+----------+--------------+ POP      Full           Yes      Yes                                 +---------+---------------+---------+-----------+----------+--------------+ PTV      Full                                                         +---------+---------------+---------+-----------+----------+--------------+ PERO     Full                                                        +---------+---------------+---------+-----------+----------+--------------+   +---------+---------------+---------+-----------+----------+--------------+ LEFT     CompressibilityPhasicitySpontaneityPropertiesThrombus Aging +---------+---------------+---------+-----------+----------+--------------+ CFV      Full           Yes      Yes                                 +---------+---------------+---------+-----------+----------+--------------+ SFJ      Full                                                        +---------+---------------+---------+-----------+----------+--------------+ FV Prox  Full                                                        +---------+---------------+---------+-----------+----------+--------------+ FV Mid   Full                                                        +---------+---------------+---------+-----------+----------+--------------+ FV DistalFull                                                        +---------+---------------+---------+-----------+----------+--------------+  PFV      Full                                                        +---------+---------------+---------+-----------+----------+--------------+ POP      Full           Yes      Yes                                 +---------+---------------+---------+-----------+----------+--------------+ PTV      Full                                                        +---------+---------------+---------+-----------+----------+--------------+ PERO     Full                                                        +---------+---------------+---------+-----------+----------+--------------+     Summary: RIGHT: - There is no evidence of deep vein thrombosis in the lower extremity.   - No cystic structure found in the popliteal fossa.  LEFT: - There is no evidence of deep vein thrombosis in the lower extremity.  - No cystic structure found in the popliteal fossa.  *See table(s) above for measurements and observations. Electronically signed by Servando Snare MD on 12/27/2021 at 4:25:48 PM.    Final      LOS: 4 days   Oren Binet, MD  [facility]    To contact the attending provider between 7A-7P or the covering provider during after hours 7P-7A, please log into the web site [email or web address] and access using universal [place] password for that web site. If you do not have the password, please call the hospital operator.  12/28/2021, 9:35 AM

## 2021-12-28 NOTE — Evaluation (Addendum)
Occupational Therapy Evaluation Patient Details Name: Cody Macdonald MRN: [id number] DOB: 01-Jan-1961 Today's Date: 12/28/2021   History of Present Illness 61 y.o. male Presented with  abnormal labs--low hemoglobin and acute on chronic renal failure.  PMH significant of CKD, CVA, HTN, Etoh abuse tobacco abuse, anemia. Resident at SNF x ~1 month s/p CVA; non-ambulatory   Clinical Impression   Pt reports using w/c at baseline for mobility, per pt could transfer independently while at rehab. Reports ind with ADLs. Pt currently min-mod A for ADLs, mod A -mod I for bed mobility, and supervision for lateral scoot transfers as pt declining standing attempt. Pt with trace LUE AROM at elbow, noted residual hemiplegia from prior CVA, unable to fully assess PROM due to pain. Pt presenting with impairments listed below, will follow acutely. Recommend SNF at d/c.     Recommendations for follow up therapy are one component of a multi-disciplinary discharge planning process, led by the attending physician.  Recommendations may be updated based on patient status, additional functional criteria and insurance authorization.   Follow Up Recommendations  Skilled nursing-short term rehab (<3 hours/day)    Assistance Recommended at Discharge Intermittent Supervision/Assistance  Patient can return home with the following A lot of help with walking and/or transfers;A lot of help with bathing/dressing/bathroom;Assistance with cooking/housework;Direct supervision/assist for financial management;Direct supervision/assist for medications management;Help with stairs or ramp for entrance;Assist for transportation    Functional Status Assessment  Patient has had a recent decline in their functional status and demonstrates the ability to make significant improvements in function in a reasonable and predictable amount of time.  Equipment Recommendations  None recommended by OT;Other (comment) (defer to next venue of care)     Recommendations for Other Services PT consult     Precautions / Restrictions Precautions Precautions: Fall Restrictions Weight Bearing Restrictions: No      Mobility Bed Mobility Overal bed mobility: Needs Assistance Bed Mobility: Supine to Sit, Sit to Supine     Supine to sit: Mod assist Sit to supine: Modified independent (Device/Increase time)   General bed mobility comments: with rail; assist to move legs over EOB and to raise torso; able to use momentum and lift LEs up onto mattress with return to supine    Transfers Overall transfer level: Needs assistance Equipment used: None Transfers: Bed to chair/wheelchair/BSC            Lateral/Scoot Transfers: Supervision, From elevated surface General transfer comment: pt refused to attempt standing despite presence of 2 therapists; attempted lateral scoot along EOB to his left with great difficulty and little progress; bed then placed in trendelenburg and pt able to laterally scoot twice with close supervision      Balance Overall balance assessment: Needs assistance Sitting-balance support: No upper extremity supported, Feet supported Sitting balance-Leahy Scale: Good Sitting balance - Comments: can reach outside BOS without LOB                                   ADL either performed or assessed with clinical judgement   ADL Overall ADL's : Needs assistance/impaired Eating/Feeding: Minimal assistance;Sitting   Grooming: Minimal assistance;Sitting   Upper Body Bathing: Sitting;Minimal assistance   Lower Body Bathing: Sitting/lateral leans;Moderate assistance   Upper Body Dressing : Minimal assistance;Sitting   Lower Body Dressing: Moderate assistance Lower Body Dressing Details (indicate cue type and reason): to don socks Toilet Transfer: BSC/3in1;Moderate assistance Toilet Transfer Details (indicate  cue type and reason): lateral scooting Toileting- Clothing Manipulation and Hygiene:  Supervision/safety;Sitting/lateral lean       Functional mobility during ADLs: Min guard       Vision   Vision Assessment?: No apparent visual deficits     Perception     Praxis      Pertinent Vitals/Pain Pain Assessment Pain Assessment: Faces Pain Score: 4  Faces Pain Scale: Hurts little more Pain Location: LUE with PROM Pain Descriptors / Indicators: Discomfort, Guarding Pain Intervention(s): Limited activity within patient's tolerance, Monitored during session     Hand Dominance Right   Extremity/Trunk Assessment Upper Extremity Assessment Upper Extremity Assessment: RUE deficits/detail;LUE deficits/detail RUE Deficits / Details: generalized weakness/ ROM WFL LUE Deficits / Details: minimal elbow AROM (~5*) only, unable to fully assess due to pain LUE: Unable to fully assess due to pain LUE Coordination: decreased fine motor;decreased gross motor   Lower Extremity Assessment Lower Extremity Assessment: Defer to PT evaluation LLE Deficits / Details: pt able to lift against gravity; knee extension 3/5, ankle DF 1+, toe extension 2-   Cervical / Trunk Assessment Cervical / Trunk Assessment: Other exceptions Cervical / Trunk Exceptions: left trunk weakness with slight rt lateral curvature   Communication Communication Communication: No difficulties   Cognition Arousal/Alertness: Awake/alert Behavior During Therapy: Flat affect Overall Cognitive Status: Within Functional Limits for tasks assessed                                       General Comments  VSS on RA    Exercises     Shoulder Instructions      Home Living Family/patient expects to be discharged to:: Skilled nursing facility                                 Additional Comments: patient's long-term goal is to return home      Prior Functioning/Environment Prior Level of Function : Needs assist             Mobility Comments: using wheelchair at SNF;  transferring modified independent; not working on walking (all per patient) ADLs Comments: reports he was bathing and dressing himself at SNF        OT Problem List: Decreased strength;Decreased range of motion;Impaired balance (sitting and/or standing);Decreased activity tolerance;Impaired tone;Impaired UE functional use;Decreased knowledge of use of DME or AE;Decreased cognition;Decreased safety awareness      OT Treatment/Interventions: Self-care/ADL training;Therapeutic exercise;DME and/or AE instruction;Therapeutic activities;Patient/family education;Balance training;Cognitive remediation/compensation;Neuromuscular education    OT Goals(Current goals can be found in the care plan section) Acute Rehab OT Goals Patient Stated Goal: none stated OT Goal Formulation: With patient Time For Goal Achievement: 01/11/22 Potential to Achieve Goals: Good ADL Goals Pt Will Perform Upper Body Dressing: with modified independence;standing;sitting Pt Will Perform Lower Body Dressing: with min assist;sit to/from stand;sitting/lateral leans Pt Will Transfer to Toilet: bedside commode;with min assist;with +2 assist Additional ADL Goal #1: Pt will use LUE as functional assist during ADL tasks  OT Frequency: Min 2X/week    Co-evaluation PT/OT/SLP Co-Evaluation/Treatment: Yes Reason for Co-Treatment: Complexity of the patient's impairments (multi-system involvement);For patient/therapist safety;To address functional/ADL transfers PT goals addressed during session: Mobility/safety with mobility;Balance OT goals addressed during session: ADL's and self-care      AM-PAC OT "6 Clicks" Daily Activity     Outcome Measure Help from  another person eating meals?: A Little Help from another person taking care of personal grooming?: A Little Help from another person toileting, which includes using toliet, bedpan, or urinal?: A Lot Help from another person bathing (including washing, rinsing, drying)?: A  Lot Help from another person to put on and taking off regular upper body clothing?: A Little Help from another person to put on and taking off regular lower body clothing?: A Lot 6 Click Score: 15   End of Session Nurse Communication: Mobility status  Activity Tolerance: Patient tolerated treatment well Patient left: in bed;with call bell/phone within reach;with bed alarm set  OT Visit Diagnosis: Unsteadiness on feet (R26.81);Other abnormalities of gait and mobility (R26.89);Muscle weakness (generalized) (M62.81);Hemiplegia and hemiparesis Hemiplegia - Right/Left: Left                Time: 7116-5790 OT Time Calculation (min): 21 min Charges:  OT General Charges $OT Visit: 1 Visit OT Evaluation $OT Eval Moderate Complexity: 1 [address] OTD, OTR/L Acute Rehab [phone] - [place] 12/28/2021, 12:37 PM

## 2021-12-29 DIAGNOSIS — Z720 Tobacco use: Secondary | ICD-10-CM | POA: Diagnosis not present

## 2021-12-29 DIAGNOSIS — N179 Acute kidney failure, unspecified: Secondary | ICD-10-CM | POA: Diagnosis not present

## 2021-12-29 DIAGNOSIS — R600 Localized edema: Secondary | ICD-10-CM | POA: Diagnosis not present

## 2021-12-29 DIAGNOSIS — D649 Anemia, unspecified: Secondary | ICD-10-CM | POA: Diagnosis not present

## 2021-12-29 LAB — CBC
HCT: 25.9 % — ABNORMAL LOW (ref 39.0–52.0)
Hemoglobin: 8.7 g/dL — ABNORMAL LOW (ref 13.0–17.0)
MCH: 29.8 pg (ref 26.0–34.0)
MCHC: 33.6 g/dL (ref 30.0–36.0)
MCV: 88.7 fL (ref 80.0–100.0)
Platelets: 104 10*3/uL — ABNORMAL LOW (ref 150–400)
RBC: 2.92 MIL/uL — ABNORMAL LOW (ref 4.22–5.81)
RDW: 15.4 % (ref 11.5–15.5)
WBC: 7.8 10*3/uL (ref 4.0–10.5)
nRBC: 0 % (ref 0.0–0.2)

## 2021-12-29 LAB — TYPE AND SCREEN
ABO/RH(D): O POS
Antibody Screen: NEGATIVE
Unit division: 0

## 2021-12-29 LAB — RENAL FUNCTION PANEL
Albumin: 1.9 g/dL — ABNORMAL LOW (ref 3.5–5.0)
Anion gap: 7 (ref 5–15)
BUN: 27 mg/dL — ABNORMAL HIGH (ref 6–20)
CO2: 25 mmol/L (ref 22–32)
Calcium: 8.1 mg/dL — ABNORMAL LOW (ref 8.9–10.3)
Chloride: 103 mmol/L (ref 98–111)
Creatinine, Ser: 5.41 mg/dL — ABNORMAL HIGH (ref 0.61–1.24)
GFR, Estimated: 11 mL/min — ABNORMAL LOW (ref >60)
Glucose, Bld: 101 mg/dL — ABNORMAL HIGH (ref 70–99)
Phosphorus: 4.2 mg/dL (ref 2.5–4.6)
Potassium: 3.9 mmol/L (ref 3.5–5.1)
Sodium: 135 mmol/L (ref 135–145)

## 2021-12-29 LAB — BPAM RBC
Blood Product Expiration Date: 202306202359
ISSUE DATE / TIME: 202305291108
Unit Type and Rh: 5100

## 2021-12-29 NOTE — Progress Notes (Signed)
Pt states he don't wish to wear CPAP.

## 2021-12-29 NOTE — Progress Notes (Signed)
Patient ID: Cody Macdonald, male   DOB: 1960/12/10, 61 y.o.   MRN: [id number] [facility] Progress Note   Assessment/ Plan:   1.  Acute kidney injury on chronic kidney disease- Unclear if this is evolution to ESRD: Suspected to have had progression of chronic kidney disease and with development of uremic symptoms and started on hemodialysis 5/26 with second treatment on 5/27.  Unclear if this is end-stage renal disease or if he will have meaningful recovery of renal function.  The process is underway for OP HD unit placement (which will likely not be locally @[facility]  units due to insurance issues per renal navigator note of 5/30, does have two clinics in [place] that will accept his insurance however his SNF will not transport him there, may need to establish at another SNF?).  Will maintain HD on MWF schedule this week 2.  Symptomatic anemia: Unclear if he had any overt losses. Iron replete on labs, ESA started 5/29.Hgb from 10.7 to 8.7, would recommend checking a FOBT--will defer to primary service 3.  History of CVA: on DAPT, per primary 4.  Hypertension: Resume oral antihypertensive agents and monitor with ultrafiltration/hemodialysis. 5.  Chronic kidney disease-metabolic bone disease: Continue to monitor calcium and phosphorus levels, corrected cal WNL. On renvela 6.  Nutrition: Likely compromised by advancing chronic kidney disease and history of alcohol use.  Monitor on ONS. Push protein  Subjective:   Denies any acute events overnight, no complaints today. Receiving 1u prbc given downtrending hgb.   Objective:   BP (!) 170/99 (BP Location: Left Arm)   Pulse 87   Temp 98.5 F (36.9 C) (Oral)   Resp 16   Ht 5\' 7"  (1.702 m)   Wt (P) 60 kg   SpO2 96%   BMI (P) 20.72 kg/m   Intake/Output Summary (Last 24 hours) at 12/29/2021 1029 Last data filed at 12/28/2021 1635 Gross per 24 hour  Intake 315 ml  Output 1000 ml  Net -685 ml   Weight change:   Physical Exam: Gen: nad,  sitting up in bed CVS: RRR, S1S2 Resp: CTA bl, unlabored OVF:IEPP, NT Ext: no sig edema b/l LEs Neuro: awake, alert Dialysis access: [facility]  Imaging: DG CHEST PORT 1 VIEW  Result Date: 12/28/2021 CLINICAL DATA:  Fever EXAM: PORTABLE CHEST 1 VIEW COMPARISON:  12/24/2021 FINDINGS: Single frontal view of the chest demonstrates right internal jugular central venous catheter tip overlying superior vena cava. Cardiac silhouette is unremarkable. Minimal right basilar consolidation and trace right pleural effusion are noted. No pneumothorax. Left chest is clear. No acute bony abnormalities. IMPRESSION: 1. Small right pleural effusion with patchy right basilar consolidation, not significantly changed since prior exam. Electronically Signed   By: Randa Ngo M.D.   On: 12/28/2021 21:14    Labs: BMET Recent Labs  Lab 12/24/21 1846 12/25/21 0231 12/26/21 0450 12/28/21 0059 12/29/21 0149  NA 142  --  138 136 135  K 4.6  --  4.3 4.2 3.9  CL 117*  --  107 106 103  CO2 12*  --  18* 24 25  GLUCOSE 99  --  81 115* 101*  BUN 100*  --  67* 62* 27*  CREATININE 13.84*  --  10.24* 8.59* 5.41*  CALCIUM 8.1*  --  8.3* 8.0* 8.1*  PHOS  --  8.3* 8.1* 5.7* 4.2   CBC Recent Labs  Lab 12/24/21 1846 12/25/21 0231 12/28/21 0059 12/28/21 1906 12/28/21 2008 12/29/21 0149  WBC 9.0   < >  8.0 7.0 8.5 7.8  NEUTROABS 5.8  --   --   --  7.2  --   HGB 6.9*   < > 7.0* 11.5* 10.2* 8.7*  HCT 21.0*   < > 21.5* 32.4* 31.0* 25.9*  MCV 94.2   < > 88.5 85.0 90.1 88.7  PLT 139*   < > 98* 114* 137* 104*   < > = values in this interval not displayed.    Medications:     amLODipine  10 mg Oral Daily   aspirin EC  81 mg Oral Daily   atorvastatin  80 mg Oral QHS   Chlorhexidine Gluconate Cloth  6 each Topical Q0600   clopidogrel  75 mg Oral Daily   darbepoetin (ARANESP) injection - DIALYSIS  100 mcg Intravenous Q Mon-HD   feeding supplement  237 mL Oral BID BM   heparin injection (subcutaneous)  5,000 Units  Subcutaneous Q8H   labetalol  200 mg Oral BID   melatonin  3 mg Oral QHS   multivitamin  1 tablet Oral QHS   pantoprazole  40 mg Oral Q1200   pneumococcal 20-valent conjugate vaccine  0.5 mL Intramuscular Tomorrow-1000   sevelamer carbonate  800 mg Oral TID WC   Gean Quint, MD [facility] 12/29/2021, 10:29 AM

## 2021-12-29 NOTE — Progress Notes (Signed)
PROGRESS NOTE        PATIENT DETAILS Name: Cody Macdonald Age: 61 y.o. Sex: male Date of Birth: 12/13/1960 Admit Date: 12/24/2021 Admitting Physician Toy Baker, MD PCP:Pcp, No  Brief Summary: Patient is a 61 y.o.  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  See below for further details.  Significant events: 5/25>> referred to ED from [facility]-progressive renal failure-concern for ESRD, severe anemia.  Admit to [facility]. 5/29>> fever Tmax 100.8-blood culture sent.  Significant studies: 5/25>> CXR: Small right pleural effusion-right basilar subsegmental atelectasis 5/26>> CT abdomen: Small to moderate free fluid in the pelvis, bilateral pleural effusions.  No evidence of SBO. 5/27>> bilateral lower extremity Doppler: No DVT. 5/29>> CXR: No obvious PNA  Significant microbiology data: 5/29>> blood culture: No growth  Procedures: 5/26>> tunneled HD catheter  Consults: Nephrology, IR  Subjective: Denies any diarrhea-cough-shortness of breath.  Denies any pain.  Objective: Vitals: Blood pressure (!) 170/99, pulse 87, temperature 98.5 F (36.9 C), temperature source Oral, resp. rate 16, height 5\' 7"  (1.702 m), weight (P) 60 kg, SpO2 96 %.   Exam: Gen Exam:Alert awake-not in any distress HEENT:atraumatic, normocephalic Chest: B/L clear to auscultation anteriorly CVS:S1S2 regular Abdomen:soft non tender, non distended Extremities:no edema Neurology: Left-sided hemiplegia Skin: no rash    Pertinent Labs/Radiology:    Latest Ref Rng & Units 12/29/2021    1:49 AM 12/28/2021    8:08 PM 12/28/2021    7:06 PM  CBC  WBC 4.0 - 10.5 K/uL 7.8   8.5   7.0    Hemoglobin 13.0 - 17.0 g/dL 8.7   10.2   11.5    Hematocrit 39.0 - 52.0 % 25.9   31.0   32.4    Platelets 150 - 400 K/uL 104   137   114      Lab Results   Component Value Date   NA 135 12/29/2021   K 3.9 12/29/2021   CL 103 12/29/2021   CO2 25 12/29/2021      Assessment/Plan: AKI on CKD stage IV-likely progression to ESRD: Started on HD this admit-tolerating well so far-nephrology following-arrangements being made for for outpatient HD.    Normocytic anemia: Likely due to CKD-no evidence of blood loss (brown stools several days back).  Hemoglobin stable-has required a total of 3 units of PRBC so far.  Aranesp/IV iron deferred to nephrology.   Fever on 5/29: No foci evident-no diarrhea-no cough.  Abdomen exam is benign.  HD catheter site looks benign.  Await blood cultures-given stability-reasonable to watch off antimicrobial therapy.  Abdominal pain: No further abdominal pain-CT abdomen on 5/26 without any significant abnormalities.    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   Per patient-he was discharged from [facility] to SNF-where he still resides.  He has not ambulated and is mostly wheelchair-bound.  HTN: BP awaiting-medications just adjusted yesterday-reassess on 5/31-continue Norvasc and labetalol.  HLD: On statin.  Right> left lower extremity edema: Edema has resolved-Doppler studies negative for DVT.  History of alcohol use: Claims no further alcohol use since hospitalization in April for CVA.  Resides at [facility].  Tobacco abuse: Counseled-on transdermal nicotine  Nutrition Status: Nutrition Problem: Moderate Malnutrition Etiology: chronic illness (CKD, stroke) Signs/Symptoms:  mild fat depletion, moderate muscle depletion Interventions: Ensure Enlive (each supplement provides 350kcal and 20 grams of protein), MVI, Liberalize Diet, Refer to RD note for recommendations   BMI Estimated body mass index is 20.72 kg/m (pended) as calculated from the following:   Height as of this encounter: 5\' 7"  (1.702 m).   Weight as of this encounter: (P) 60 kg.    Code status:   Code Status: Full Code   DVT Prophylaxis: SQ heparin  Family Communication: None at bedside   Disposition Plan: Status is: Inpatient Remains inpatient appropriate because: Likely progression to ESRD-started on HD this admission-clipping process in progress.  Now with fever-not stable to be discharged.   Planned Discharge Destination:Skilled nursing facility   Diet: Diet Order             Diet regular Room service appropriate? Yes; Fluid consistency: Thin; Fluid restriction: 1200 mL Fluid  Diet effective now                     Antimicrobial agents: Anti-infectives (From admission, onward)    Start     Dose/Rate Route Frequency Ordered Stop   12/25/21 1015  ceFAZolin (ANCEF) IVPB 2g/100 mL premix        2 g 200 mL/hr over 30 Minutes Intravenous To Radiology 12/25/21 0943 12/25/21 1116        MEDICATIONS: Scheduled Meds:  amLODipine  10 mg Oral Daily   aspirin EC  81 mg Oral Daily   atorvastatin  80 mg Oral QHS   Chlorhexidine Gluconate Cloth  6 each Topical Q0600   clopidogrel  75 mg Oral Daily   darbepoetin (ARANESP) injection - DIALYSIS  100 mcg Intravenous Q Mon-HD   feeding supplement  237 mL Oral BID BM   heparin injection (subcutaneous)  5,000 Units Subcutaneous Q8H   labetalol  200 mg Oral BID   melatonin  3 mg Oral QHS   multivitamin  1 tablet Oral QHS   pantoprazole  40 mg Oral Q1200   pneumococcal 20-valent conjugate vaccine  0.5 mL Intramuscular Tomorrow-1000   sevelamer carbonate  800 mg Oral TID WC   Continuous Infusions:   PRN Meds:.hydrALAZINE, HYDROmorphone (DILAUDID) injection   I have personally reviewed following labs and imaging studies  LABORATORY DATA: CBC: Recent Labs  Lab 12/24/21 1846 12/25/21 0231 12/26/21 0450 12/28/21 0059 12/28/21 1906 12/28/21 2008 12/29/21 0149  WBC 9.0   < > 11.8* 8.0 7.0 8.5 7.8  NEUTROABS 5.8  --   --   --   --  7.2  --   HGB 6.9*   < > 8.8* 7.0* 11.5* 10.2* 8.7*  HCT  21.0*   < > 25.2* 21.5* 32.4* 31.0* 25.9*  MCV 94.2   < > 86.0 88.5 85.0 90.1 88.7  PLT 139*   < > 105* 98* 114* 137* 104*   < > = values in this interval not displayed.     Basic Metabolic Panel: Recent Labs  Lab 12/24/21 1846 12/25/21 0231 12/26/21 0450 12/28/21 0059 12/29/21 0149  NA 142  --  138 136 135  K 4.6  --  4.3 4.2 3.9  CL 117*  --  107 106 103  CO2 12*  --  18* 24 25  GLUCOSE 99  --  81 115* 101*  BUN 100*  --  67* 62* 27*  CREATININE 13.84*  --  10.24* 8.59* 5.41*  CALCIUM 8.1*  --  8.3* 8.0* 8.1*  MG  --  1.8  --   --   --   PHOS  --  8.3* 8.1* 5.7* 4.2     GFR: Estimated Creatinine Clearance: 12.5 mL/min (A) (by C-G formula based on SCr of 5.41 mg/dL (H)).  Liver Function Tests: Recent Labs  Lab 12/24/21 1846 12/26/21 0450 12/28/21 0059 12/29/21 0149  AST 9*  --   --   --   ALT 8  --   --   --   ALKPHOS 90  --   --   --   BILITOT 0.5  --   --   --   PROT 6.8  --   --   --   ALBUMIN 2.4* 2.0* 1.9* 1.9*    No results for input(s): LIPASE, AMYLASE in the last 168 hours. No results for input(s): AMMONIA in the last 168 hours.  Coagulation Profile: Recent Labs  Lab 12/24/21 1846  INR 1.1     Cardiac Enzymes: Recent Labs  Lab 12/25/21 0231  CKTOTAL 119     BNP (last 3 results) No results for input(s): PROBNP in the last 8760 hours.  Lipid Profile: No results for input(s): CHOL, HDL, LDLCALC, TRIG, CHOLHDL, LDLDIRECT in the last 72 hours.   Thyroid Function Tests: No results for input(s): TSH, T4TOTAL, FREET4, T3FREE, THYROIDAB in the last 72 hours.   Anemia Panel: No results for input(s): VITAMINB12, FOLATE, FERRITIN, TIBC, IRON, RETICCTPCT in the last 72 hours.   Urine analysis: No results found for: COLORURINE, APPEARANCEUR, LABSPEC, PHURINE, GLUCOSEU, HGBUR, BILIRUBINUR, KETONESUR, PROTEINUR, UROBILINOGEN, NITRITE, LEUKOCYTESUR  Sepsis Labs: Lactic Acid, Venous    Component Value Date/Time   LATICACIDVEN 1.0  12/28/2021 2222    MICROBIOLOGY: Recent Results (from the past 240 hour(s))  Culture, blood (Routine X 2) w Reflex to ID Panel     Status: None (Preliminary result)   Collection Time: 12/28/21  8:12 PM   Specimen: BLOOD RIGHT HAND  Result Value Ref Range Status   Specimen Description BLOOD RIGHT HAND  Final   Special Requests   Final    BOTTLES DRAWN AEROBIC AND ANAEROBIC Blood Culture adequate volume   Culture   Final    NO GROWTH < 12 HOURS Performed at [facility], [address], [place], [place] [address]    Report Status PENDING  Incomplete  Culture, blood (Routine X 2) w Reflex to ID Panel     Status: None (Preliminary result)   Collection Time: 12/28/21  8:12 PM   Specimen: BLOOD  Result Value Ref Range Status   Specimen Description BLOOD RIGHT ANTECUBITAL  Final   Special Requests   Final    BOTTLES DRAWN AEROBIC AND ANAEROBIC Blood Culture adequate volume   Culture   Final    NO GROWTH < 12 HOURS Performed at [facility], [place] [address] [place], [place] [address]    Report Status PENDING  Incomplete    RADIOLOGY STUDIES/RESULTS: DG CHEST PORT 1 VIEW  Result Date: 12/28/2021 CLINICAL DATA:  Fever EXAM: PORTABLE CHEST 1 VIEW COMPARISON:  12/24/2021 FINDINGS: Single frontal view of the chest demonstrates right internal jugular central venous catheter tip overlying superior vena cava. Cardiac silhouette is unremarkable. Minimal right basilar consolidation and trace right pleural effusion are noted. No pneumothorax. Left chest is clear. No acute bony abnormalities. IMPRESSION: 1. Small right pleural effusion with patchy right basilar consolidation, not significantly changed since prior exam. Electronically Signed   By: Randa Ngo M.D.   On: 12/28/2021 21:14  LOS: 5 days   Oren Binet, MD  [facility]    To contact the attending provider between 7A-7P or the covering provider during after hours 7P-7A, please log into the web site  [email or web address] and access using universal [place] password for that web site. If you do not have the password, please call the hospital operator.  12/29/2021, 11:48 AM

## 2021-12-29 NOTE — Progress Notes (Signed)
Pt has been financially denied by [facility] due to pt's insurance [facility] does not accept). [facility] which has 2 clinics in [facility] and informed they do accept pt's insurance. This info communicated with CSW. Pt's current snf will not transport pt to a clinic in [facility]. Will continue to follow along and assist with appropriate clinic placement based on pt's d/c plan. Update provided to nephrologist as well.   Melven Sartorius Renal Navigator [phone]

## 2021-12-30 DIAGNOSIS — R600 Localized edema: Secondary | ICD-10-CM | POA: Diagnosis not present

## 2021-12-30 DIAGNOSIS — D649 Anemia, unspecified: Secondary | ICD-10-CM | POA: Diagnosis not present

## 2021-12-30 DIAGNOSIS — N179 Acute kidney failure, unspecified: Secondary | ICD-10-CM | POA: Diagnosis not present

## 2021-12-30 DIAGNOSIS — Z720 Tobacco use: Secondary | ICD-10-CM | POA: Diagnosis not present

## 2021-12-30 LAB — HEPATITIS B CORE ANTIBODY, TOTAL: Hep B Core Total Ab: NONREACTIVE

## 2021-12-30 MED ORDER — HYDRALAZINE HCL 25 MG PO TABS
25.0000 mg | ORAL_TABLET | Freq: Three times a day (TID) | ORAL | Status: DC
  Administered 2021-12-30 – 2021-12-31 (×4): 25 mg via ORAL
  Filled 2021-12-30 (×4): qty 1

## 2021-12-30 MED ORDER — HEPARIN SODIUM (PORCINE) 1000 UNIT/ML IJ SOLN
INTRAMUSCULAR | Status: AC
  Administered 2021-12-30: 1000 [IU]
  Filled 2021-12-30: qty 4

## 2021-12-30 MED ORDER — LABETALOL HCL 300 MG PO TABS
300.0000 mg | ORAL_TABLET | Freq: Two times a day (BID) | ORAL | Status: DC
  Administered 2021-12-30 – 2022-01-03 (×10): 300 mg via ORAL
  Filled 2021-12-30 (×13): qty 1

## 2021-12-30 NOTE — TOC Progression Note (Signed)
Transition of Care [facility]) - Progression Note    Patient Details  Name: Cody Macdonald MRN: [id number] Date of Birth: Feb 07, 1961  Transition of Care [facility]) CM/SW [place], LCSW Phone Number: 12/30/2021, 8:57 AM  Clinical Narrative:    CSW inquiring with [place] SNFs to see if they can accept patient as [facility] unable to provide transportation to [place]. CSW also inquiring if patient's Medicaid would be able to transport him to [place].    Expected Discharge Plan: Skilled Nursing Facility Barriers to Discharge: Other (must enter comment) (Needs SNF in [place] since [facility] [facility] are not in network with his insurance)  Expected Discharge Plan and Services Expected Discharge Plan: [place] In-house Referral: Clinical Social Work   Post Acute Care Choice: [place], Dialysis Living arrangements for the past 2 months: [place]                                       Social Determinants of Health (SDOH) Interventions    Readmission Risk Interventions     View : No data to display.

## 2021-12-30 NOTE — Progress Notes (Signed)
Case discussed with CSW. Referral made to [facility]) for out-pt HD in [place]. Spoke to [place] at [phone]. Rise Paganini advised of pt's current snf situation. If pt's current snf can provide/obtain transportation to [facility] clinic, will proceed with referral to [facility] which is closest to snf. If pt's current snf cannot transport pt, then will proceed with referral to [place] which is being requested by a snf in [facility] that is considering pt.  Required documents faxed to [facility] for review[facility] accept pt's insurance. Will assist as needed.   Melven Sartorius Renal Navigator [phone]

## 2021-12-30 NOTE — Progress Notes (Signed)
PROGRESS NOTE        PATIENT DETAILS Name: Cody Macdonald Age: 61 y.o. Sex: male Date of Birth: 1961-03-29 Admit Date: 12/24/2021 Admitting Physician Toy Baker, MD PCP:Pcp, No  Brief Summary: Patient is a 61 y.o.  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  See below for further details.  Significant events: 5/25>> referred to ED from [facility]-progressive renal failure-concern for ESRD, severe anemia.  Admit to [facility]. 5/29>> fever Tmax 100.8-blood culture sent.  Significant studies: 5/25>> CXR: Small right pleural effusion-right basilar subsegmental atelectasis 5/26>> CT abdomen: Small to moderate free fluid in the pelvis, bilateral pleural effusions.  No evidence of SBO. 5/27>> bilateral lower extremity Doppler: No DVT. 5/29>> CXR: No obvious PNA  Significant microbiology data: 5/29>> blood culture: No growth  Procedures: 5/26>> tunneled HD catheter  Consults: Nephrology, IR  Subjective: Lying comfortably in bed-no fever overnight-no diarrhea-no abdominal pain-no cough-no shortness of breath  Objective: Vitals: Blood pressure (!) 188/121, pulse 95, temperature 98.2 F (36.8 C), temperature source Oral, resp. rate 16, height 5\' 7"  (1.702 m), weight (P) 60 kg, SpO2 96 %.   Exam: Gen Exam:Alert awake-not in any distress HEENT:atraumatic, normocephalic Chest: B/L clear to auscultation anteriorly CVS:S1S2 regular Abdomen:soft non tender, non distended Extremities:no edema Neurology: Non focal Skin: no rash    Pertinent Labs/Radiology:    Latest Ref Rng & Units 12/29/2021    1:49 AM 12/28/2021    8:08 PM 12/28/2021    7:06 PM  CBC  WBC 4.0 - 10.5 K/uL 7.8   8.5   7.0    Hemoglobin 13.0 - 17.0 g/dL 8.7   10.2   11.5    Hematocrit 39.0 - 52.0 % 25.9   31.0   32.4    Platelets 150 - 400 K/uL 104    137   114      Lab Results  Component Value Date   NA 135 12/29/2021   K 3.9 12/29/2021   CL 103 12/29/2021   CO2 25 12/29/2021      Assessment/Plan: AKI on CKD stage IV-likely progression to ESRD: Started on HD this admit-tolerating well so far-nephrology following-arrangements being made for for outpatient HD.    Normocytic anemia: Likely due to CKD-no evidence of blood loss (brown stools several days back).  Hemoglobin stable-has required a total of 3 units of PRBC so far.  Aranesp/IV iron deferred to nephrology.   Fever on 5/29: No fever since then-no foci of infection apparent-blood cultures negative so far.  Being monitored off antimicrobial therapy.    Abdominal pain: No further abdominal pain-CT abdomen on 5/26 without any significant abnormalities.    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   Per patient-he was discharged from [facility] to SNF-where he still resides.  He has not ambulated and is mostly wheelchair-bound.  HTN: BP still on the higher side-continue Norvasc/hydralazine, increase labetalol to 300 twice daily-follow and optimize.  HLD: On statin.  Right> left lower extremity edema: Edema has resolved-Doppler studies negative for DVT.  History of alcohol use: Claims no further alcohol use since hospitalization in April for CVA.  Resides at [facility].  Tobacco abuse: Counseled-on transdermal nicotine  Nutrition Status: Nutrition Problem: Moderate Malnutrition Etiology: chronic illness (  CKD, stroke) Signs/Symptoms: mild fat depletion, moderate muscle depletion Interventions: Ensure Enlive (each supplement provides 350kcal and 20 grams of protein), MVI, Liberalize Diet, Refer to RD note for recommendations   BMI Estimated body mass index is 20.72 kg/m (pended) as calculated from the following:   Height as of this encounter: 5\' 7"  (1.702 m).   Weight as of this encounter: (P) 60 kg.    Code status:   Code Status: Full Code   DVT Prophylaxis: SQ heparin  Family Communication: None at bedside   Disposition Plan: Status is: Inpatient Remains inpatient appropriate because: Likely progression to ESRD-started on HD this admission-clipping process in progress.  Hopefully SNF in the next day or so if he remains afebrile and cultures remain negative.   Planned Discharge Destination:Skilled nursing facility   Diet: Diet Order             Diet regular Room service appropriate? Yes; Fluid consistency: Thin; Fluid restriction: 1200 mL Fluid  Diet effective now                     Antimicrobial agents: Anti-infectives (From admission, onward)    Start     Dose/Rate Route Frequency Ordered Stop   12/25/21 1015  ceFAZolin (ANCEF) IVPB 2g/100 mL premix        2 g 200 mL/hr over 30 Minutes Intravenous To Radiology 12/25/21 0943 12/25/21 1116        MEDICATIONS: Scheduled Meds:  amLODipine  10 mg Oral Daily   aspirin EC  81 mg Oral Daily   atorvastatin  80 mg Oral QHS   Chlorhexidine Gluconate Cloth  6 each Topical Q0600   clopidogrel  75 mg Oral Daily   darbepoetin (ARANESP) injection - DIALYSIS  100 mcg Intravenous Q Mon-HD   feeding supplement  237 mL Oral BID BM   heparin injection (subcutaneous)  5,000 Units Subcutaneous Q8H   hydrALAZINE  25 mg Oral Q8H   labetalol  300 mg Oral BID   melatonin  3 mg Oral QHS   multivitamin  1 tablet Oral QHS   pantoprazole  40 mg Oral Q1200   pneumococcal 20-valent conjugate vaccine  0.5 mL Intramuscular Tomorrow-1000   sevelamer carbonate  800 mg Oral TID WC   Continuous Infusions:   PRN Meds:.hydrALAZINE, HYDROmorphone (DILAUDID) injection   I have personally reviewed following labs and imaging studies  LABORATORY DATA: CBC: Recent Labs  Lab 12/24/21 1846 12/25/21 0231 12/26/21 0450 12/28/21 0059 12/28/21 1906 12/28/21 2008 12/29/21 0149  WBC 9.0   < > 11.8* 8.0 7.0 8.5 7.8  NEUTROABS 5.8  --    --   --   --  7.2  --   HGB 6.9*   < > 8.8* 7.0* 11.5* 10.2* 8.7*  HCT 21.0*   < > 25.2* 21.5* 32.4* 31.0* 25.9*  MCV 94.2   < > 86.0 88.5 85.0 90.1 88.7  PLT 139*   < > 105* 98* 114* 137* 104*   < > = values in this interval not displayed.     Basic Metabolic Panel: Recent Labs  Lab 12/24/21 1846 12/25/21 0231 12/26/21 0450 12/28/21 0059 12/29/21 0149  NA 142  --  138 136 135  K 4.6  --  4.3 4.2 3.9  CL 117*  --  107 106 103  CO2 12*  --  18* 24 25  GLUCOSE 99  --  81 115* 101*  BUN 100*  --  67* 62* 27*  CREATININE  13.84*  --  10.24* 8.59* 5.41*  CALCIUM 8.1*  --  8.3* 8.0* 8.1*  MG  --  1.8  --   --   --   PHOS  --  8.3* 8.1* 5.7* 4.2     GFR: Estimated Creatinine Clearance: 12.5 mL/min (A) (by C-G formula based on SCr of 5.41 mg/dL (H)).  Liver Function Tests: Recent Labs  Lab 12/24/21 1846 12/26/21 0450 12/28/21 0059 12/29/21 0149  AST 9*  --   --   --   ALT 8  --   --   --   ALKPHOS 90  --   --   --   BILITOT 0.5  --   --   --   PROT 6.8  --   --   --   ALBUMIN 2.4* 2.0* 1.9* 1.9*    No results for input(s): LIPASE, AMYLASE in the last 168 hours. No results for input(s): AMMONIA in the last 168 hours.  Coagulation Profile: Recent Labs  Lab 12/24/21 1846  INR 1.1     Cardiac Enzymes: Recent Labs  Lab 12/25/21 0231  CKTOTAL 119     BNP (last 3 results) No results for input(s): PROBNP in the last 8760 hours.  Lipid Profile: No results for input(s): CHOL, HDL, LDLCALC, TRIG, CHOLHDL, LDLDIRECT in the last 72 hours.   Thyroid Function Tests: No results for input(s): TSH, T4TOTAL, FREET4, T3FREE, THYROIDAB in the last 72 hours.   Anemia Panel: No results for input(s): VITAMINB12, FOLATE, FERRITIN, TIBC, IRON, RETICCTPCT in the last 72 hours.   Urine analysis: No results found for: COLORURINE, APPEARANCEUR, LABSPEC, PHURINE, GLUCOSEU, HGBUR, BILIRUBINUR, KETONESUR, PROTEINUR, UROBILINOGEN, NITRITE, LEUKOCYTESUR  Sepsis Labs: Lactic  Acid, Venous    Component Value Date/Time   LATICACIDVEN 1.0 12/28/2021 2222    MICROBIOLOGY: Recent Results (from the past 240 hour(s))  Culture, blood (Routine X 2) w Reflex to ID Panel     Status: None (Preliminary result)   Collection Time: 12/28/21  8:12 PM   Specimen: BLOOD RIGHT HAND  Result Value Ref Range Status   Specimen Description BLOOD RIGHT HAND  Final   Special Requests   Final    BOTTLES DRAWN AEROBIC AND ANAEROBIC Blood Culture adequate volume   Culture   Final    NO GROWTH < 12 HOURS Performed at [facility], [address] [place], [place] [address]    Report Status PENDING  Incomplete  Culture, blood (Routine X 2) w Reflex to ID Panel     Status: None (Preliminary result)   Collection Time: 12/28/21  8:12 PM   Specimen: BLOOD  Result Value Ref Range Status   Specimen Description BLOOD RIGHT ANTECUBITAL  Final   Special Requests   Final    BOTTLES DRAWN AEROBIC AND ANAEROBIC Blood Culture adequate volume   Culture   Final    NO GROWTH < 12 HOURS Performed at [facility], [place] [address], [place], [place] [address]    Report Status PENDING  Incomplete    RADIOLOGY STUDIES/RESULTS: DG CHEST PORT 1 VIEW  Result Date: 12/28/2021 CLINICAL DATA:  Fever EXAM: PORTABLE CHEST 1 VIEW COMPARISON:  12/24/2021 FINDINGS: Single frontal view of the chest demonstrates right internal jugular central venous catheter tip overlying superior vena cava. Cardiac silhouette is unremarkable. Minimal right basilar consolidation and trace right pleural effusion are noted. No pneumothorax. Left chest is clear. No acute bony abnormalities. IMPRESSION: 1. Small right pleural effusion with patchy right basilar  consolidation, not significantly changed since prior exam. Electronically Signed   By: Randa Ngo M.D.   On: 12/28/2021 21:14     LOS: 6 days   Oren Binet, MD  [facility]    To contact the attending provider between 7A-7P or the covering  provider during after hours 7P-7A, please log into the web site [email or web address] and access using universal [place] password for that web site. If you do not have the password, please call the hospital operator.  12/30/2021, 9:50 AM

## 2021-12-30 NOTE — Progress Notes (Signed)
Patient ID: Cody Macdonald, male   DOB: April 29, 1961, 61 y.o.   MRN: [id number] [facility] Progress Note   Assessment/ Plan:   1.  Acute kidney injury on chronic kidney disease- Unclear if this is evolution to ESRD: Suspected to have had progression of chronic kidney disease and with development of uremic symptoms and started on hemodialysis 5/26 with second treatment on 5/27.  Unclear if this is end-stage renal disease or if he will have meaningful recovery of renal function.  The process is underway for OP HD unit placement (which will likely not be locally @[facility]  units due to insurance issues per renal navigator note of 5/30, does have two clinics in [place] that will accept his insurance however his SNF will not transport him there, may need to establish at another SNF?).  Will maintain HD on MWF schedule 2.  Symptomatic anemia: Unclear if he had any overt losses. Iron replete on labs, ESA started 5/29.Hgb from 10.7 to 8.7 on 5/30, s/p 1u prbc. would recommend checking a FOBT--will defer to primary service 3.  History of CVA: on DAPT, per primary 4.  Hypertension: Resume oral antihypertensive agents and monitor with ultrafiltration/hemodialysis. 5.  Chronic kidney disease-metabolic bone disease: Continue to monitor calcium and phosphorus levels, corrected cal WNL. On renvela 6.  Nutrition: Likely compromised by advancing chronic kidney disease and history of alcohol use.  Monitor on ONS. Push protein  Subjective:   Denies any acute events overnight, no complaints today.   Objective:   BP (!) 188/121 (BP Location: Right Arm)   Pulse 95   Temp 98.2 F (36.8 C) (Oral)   Resp 16   Ht 5\' 7"  (1.702 m)   Wt (P) 60 kg   SpO2 96%   BMI (P) 20.72 kg/m   Intake/Output Summary (Last 24 hours) at 12/30/2021 1126 Last data filed at 12/30/2021 0900 Gross per 24 hour  Intake 120 ml  Output 700 ml  Net -580 ml   Weight change:   Physical Exam: Gen: nad, laying flat in bed, flat  affect CVS: RRR, S1S2 Resp: CTA bl, unlabored XOV:ANVB, NT Ext: no sig edema b/l LEs Neuro: awake, alert Dialysis access: RIJ [facility]  Imaging: DG CHEST PORT 1 VIEW  Result Date: 12/28/2021 CLINICAL DATA:  Fever EXAM: PORTABLE CHEST 1 VIEW COMPARISON:  12/24/2021 FINDINGS: Single frontal view of the chest demonstrates right internal jugular central venous catheter tip overlying superior vena cava. Cardiac silhouette is unremarkable. Minimal right basilar consolidation and trace right pleural effusion are noted. No pneumothorax. Left chest is clear. No acute bony abnormalities. IMPRESSION: 1. Small right pleural effusion with patchy right basilar consolidation, not significantly changed since prior exam. Electronically Signed   By: Randa Ngo M.D.   On: 12/28/2021 21:14    Labs: BMET Recent Labs  Lab 12/24/21 1846 12/25/21 0231 12/26/21 0450 12/28/21 0059 12/29/21 0149  NA 142  --  138 136 135  K 4.6  --  4.3 4.2 3.9  CL 117*  --  107 106 103  CO2 12*  --  18* 24 25  GLUCOSE 99  --  81 115* 101*  BUN 100*  --  67* 62* 27*  CREATININE 13.84*  --  10.24* 8.59* 5.41*  CALCIUM 8.1*  --  8.3* 8.0* 8.1*  PHOS  --  8.3* 8.1* 5.7* 4.2   CBC Recent Labs  Lab 12/24/21 1846 12/25/21 0231 12/28/21 0059 12/28/21 1906 12/28/21 2008 12/29/21 0149  WBC 9.0   < >  8.0 7.0 8.5 7.8  NEUTROABS 5.8  --   --   --  7.2  --   HGB 6.9*   < > 7.0* 11.5* 10.2* 8.7*  HCT 21.0*   < > 21.5* 32.4* 31.0* 25.9*  MCV 94.2   < > 88.5 85.0 90.1 88.7  PLT 139*   < > 98* 114* 137* 104*   < > = values in this interval not displayed.    Medications:     amLODipine  10 mg Oral Daily   aspirin EC  81 mg Oral Daily   atorvastatin  80 mg Oral QHS   Chlorhexidine Gluconate Cloth  6 each Topical Q0600   clopidogrel  75 mg Oral Daily   darbepoetin (ARANESP) injection - DIALYSIS  100 mcg Intravenous Q Mon-HD   feeding supplement  237 mL Oral BID BM   heparin injection (subcutaneous)  5,000 Units  Subcutaneous Q8H   hydrALAZINE  25 mg Oral Q8H   labetalol  300 mg Oral BID   melatonin  3 mg Oral QHS   multivitamin  1 tablet Oral QHS   pantoprazole  40 mg Oral Q1200   pneumococcal 20-valent conjugate vaccine  0.5 mL Intramuscular Tomorrow-1000   sevelamer carbonate  800 mg Oral TID WC   Gean Quint, MD [facility] 12/30/2021, 11:26 AM

## 2021-12-30 NOTE — NC FL2 (Signed)
[place] LEVEL OF CARE SCREENING TOOL     IDENTIFICATION  Patient Name: Cody Macdonald Birthdate: July 14, 1961 Sex: male Admission Date (Current Location): 12/24/2021  [facility] and [place] Number:  Herbalist and Address:  [facility], [place] [address], [place], [place] [address]      Provider Number: [id number]  Attending Physician Name and Address:  Jonetta Osgood, MD  Relative Name and Phone Number:       Current Level of Care: Hospital Recommended Level of Care: [place] Prior Approval Number:    Date Approved/Denied:   PASRR Number: [id number]  Discharge Plan: SNF    Current Diagnoses: Patient Active Problem List   Diagnosis Date Noted   Malnutrition of moderate degree 12/26/2021   Acute renal failure superimposed on stage 3b chronic kidney disease ([place]) 12/24/2021   Symptomatic anemia 12/24/2021   History of stroke 12/24/2021   Tobacco abuse 12/24/2021   Alcohol abuse 12/24/2021   Hypoalbuminemia 12/24/2021   AKI (acute kidney injury) ([place]) 12/24/2021   Abdominal distention 12/24/2021   Leg edema 12/24/2021    Orientation RESPIRATION BLADDER Height & Weight     Self, Time, Situation, Place  Normal Incontinent, External catheter Weight: (P) 132 lb 4.4 oz (60 kg) Height:  5\' 7"  (170.2 cm)  BEHAVIORAL SYMPTOMS/MOOD NEUROLOGICAL BOWEL NUTRITION STATUS      Incontinent Diet (See dc summary)  AMBULATORY STATUS COMMUNICATION OF NEEDS Skin   Limited Assist Verbally Normal                       Personal Care Assistance Level of Assistance  Bathing, Feeding, Dressing Bathing Assistance: Limited assistance Feeding assistance: Independent Dressing Assistance: Limited assistance     Functional Limitations Info             [place]  PT (By licensed PT)     PT Frequency: 2x/week              Contractures Contractures Info: Not present    Additional  Factors Info  Code Status, Allergies Code Status Info: Full Allergies Info: NKA           Current Medications (12/30/2021):  This is the current hospital active medication list Current Facility-Administered Medications  Medication Dose Route Frequency Provider Last Rate Last Admin   amLODipine (NORVASC) tablet 10 mg  10 mg Oral Daily Jonetta Osgood, MD   10 mg at 12/30/21 3154   aspirin EC tablet 81 mg  81 mg Oral Daily Jonetta Osgood, MD   81 mg at 12/30/21 0807   atorvastatin (LIPITOR) tablet 80 mg  80 mg Oral QHS Jonetta Osgood, MD   80 mg at 12/29/21 2132   Chlorhexidine Gluconate Cloth 2 % PADS 6 each  6 each Topical Q0600 Elmarie Shiley, MD   6 each at 12/30/21 0539   clopidogrel (PLAVIX) tablet 75 mg  75 mg Oral Daily Jonetta Osgood, MD   75 mg at 12/30/21 0086   Darbepoetin Alfa (ARANESP) injection 100 mcg  100 mcg Intravenous Q Mon-HD Gean Quint, MD       feeding supplement (ENSURE ENLIVE / ENSURE PLUS) liquid 237 mL  237 mL Oral BID BM Jonetta Osgood, MD   237 mL at 12/30/21 0812   heparin injection 5,000 Units  5,000 Units Subcutaneous Q8H Jonetta Osgood, MD   5,000 Units at 12/30/21 0539   hydrALAZINE (  APRESOLINE) injection 10 mg  10 mg Intravenous Q6H PRN Jonetta Osgood, MD   10 mg at 12/30/21 0319   hydrALAZINE (APRESOLINE) tablet 25 mg  25 mg Oral Q8H Gean Quint, MD   25 mg at 12/30/21 6754   HYDROmorphone (DILAUDID) injection 1 mg  1 mg Intravenous Q4H PRN Jonetta Osgood, MD   1 mg at 12/26/21 0911   labetalol (NORMODYNE) tablet 300 mg  300 mg Oral BID Jonetta Osgood, MD   300 mg at 12/30/21 4920   melatonin tablet 3 mg  3 mg Oral QHS Doutova, Nyoka Lint, MD   3 mg at 12/29/21 2132   multivitamin (RENA-VIT) tablet 1 tablet  1 tablet Oral QHS Jonetta Osgood, MD   1 tablet at 12/29/21 2132   pantoprazole (PROTONIX) EC tablet 40 mg  40 mg Oral Q1200 Doutova, Nyoka Lint, MD   40 mg at 12/29/21 1321   pneumococcal 20-valent conjugate vaccine  (PREVNAR 20) injection 0.5 mL  0.5 mL Intramuscular Tomorrow-1000 Ghimire, Henreitta Leber, MD       sevelamer carbonate (RENVELA) tablet 800 mg  800 mg Oral TID WC Gean Quint, MD   800 mg at 12/30/21 1007     Discharge Medications: Please see discharge summary for a list of discharge medications.  Relevant Imaging Results:  Relevant Lab Results:   Additional Information SSn: [id number]. Requires dialysis transport to [facility]. Schedule Pending. Bradford Trino Higinbotham, LCSW

## 2021-12-30 NOTE — Progress Notes (Signed)
Occupational Therapy Treatment Patient Details Name: WISAM SIEFRING MRN: [id number] DOB: 1960-12-25 Today's Date: 12/30/2021   History of present illness 61 y.o. male Presented with  abnormal labs--low hemoglobin and acute on chronic renal failure.  PMH significant of CKD, CVA, HTN, Etoh abuse tobacco abuse, anemia. Resident at SNF x ~1 month s/p CVA; non-ambulatory   OT comments  Pt making slow progress towards goals, focus of session on improving sitting balance and strength for transfers/ADLs. Pt able to perform seated UB/LB exercises at EOB, educated pt on techniques for self ROM for LUE,  pt able to demonstrate. Pt min guard-min A for bed mobility, requiring min guard at EOB as pt with posterior lean while performing exercises. Pt supervision for lateral scoot transfer simulated scooting toward HOB. Pt appearing agitated this session, declining further mobility. Pt presenting with impairments listed below, will follow acutely. Continue to recommend SNF at d/c.   Recommendations for follow up therapy are one component of a multi-disciplinary discharge planning process, led by the attending physician.  Recommendations may be updated based on patient status, additional functional criteria and insurance authorization.    Follow Up Recommendations  Skilled nursing-short term rehab (<3 hours/day)    Assistance Recommended at Discharge Intermittent Supervision/Assistance  Patient can return home with the following  A lot of help with walking and/or transfers;A lot of help with bathing/dressing/bathroom;Assistance with cooking/housework;Direct supervision/assist for financial management;Direct supervision/assist for medications management;Help with stairs or ramp for entrance;Assist for transportation   Equipment Recommendations  None recommended by OT;Other (comment) (defer to next venue of care)    Recommendations for Other Services PT consult    Precautions / Restrictions  Precautions Precautions: Fall Restrictions Weight Bearing Restrictions: No       Mobility Bed Mobility Overal bed mobility: Needs Assistance Bed Mobility: Supine to Sit, Sit to Supine     Supine to sit: Min assist Sit to supine: Min guard   General bed mobility comments: min A to pull trunk into sitting    Transfers Overall transfer level: Needs assistance Equipment used: None Transfers: Bed to chair/wheelchair/BSC            Lateral/Scoot Transfers: Supervision General transfer comment: pt refusing standing attempt     Balance Overall balance assessment: Needs assistance Sitting-balance support: No upper extremity supported, Feet supported Sitting balance-Leahy Scale: Fair Sitting balance - Comments: posterior lean with LB/UB exercise                                   ADL either performed or assessed with clinical judgement   ADL                           Toilet Transfer: Minimal assistance;BSC/3in1 Toilet Transfer Details (indicate cue type and reason): simulated with lateral scooting                Extremity/Trunk Assessment Upper Extremity Assessment Upper Extremity Assessment: RUE deficits/detail RUE Deficits / Details: generalized weakness/ ROM WFL LUE Deficits / Details: minimal elbow AROM (~5*) only, unable to fully assess due to pain LUE Coordination: decreased fine motor;decreased gross motor   Lower Extremity Assessment Lower Extremity Assessment: Defer to PT evaluation        Vision   Vision Assessment?: No apparent visual deficits   Perception Perception Perception: Not tested   Praxis Praxis Praxis: Not tested    Cognition  Arousal/Alertness: Awake/alert Behavior During Therapy: Flat affect Overall Cognitive Status: Within Functional Limits for tasks assessed                                          Exercises Exercises: General Upper Extremity, General Lower Extremity General  Exercises - Upper Extremity Shoulder Flexion: AROM, Self ROM, Both, 5 reps Elbow Flexion: AROM, Self ROM, Both, 5 reps, Seated Elbow Extension: AROM, Self ROM, 5 reps, Both, Seated Wrist Flexion: AROM, Self ROM, Both, 5 reps, Seated Wrist Extension: AROM, AAROM, Both, 5 reps, Seated General Exercises - Lower Extremity Ankle Circles/Pumps: Both, 5 reps, AROM Long Arc Quad: Both, 5 reps, Seated    Shoulder Instructions       General Comments VSS on RA    Pertinent Vitals/ Pain       Pain Assessment Pain Assessment: No/denies pain Pain Intervention(s): Monitored during session  Home Living                                          Prior Functioning/Environment              Frequency  Min 2X/week        Progress Toward Goals  OT Goals(current goals can now be found in the care plan section)  Progress towards OT goals: Progressing toward goals  Acute Rehab OT Goals Patient Stated Goal: to go home or to rehab OT Goal Formulation: With patient Time For Goal Achievement: 01/11/22 Potential to Achieve Goals: Good ADL Goals Pt Will Perform Upper Body Dressing: with modified independence;standing;sitting Pt Will Perform Lower Body Dressing: with min assist;sit to/from stand;sitting/lateral leans Pt Will Transfer to Toilet: bedside commode;with min assist;with +2 assist Additional ADL Goal #1: Pt will use LUE as functional assist during ADL tasks  Plan Discharge plan remains appropriate;Frequency remains appropriate    Co-evaluation                 AM-PAC OT "6 Clicks" Daily Activity     Outcome Measure   Help from another person eating meals?: A Little Help from another person taking care of personal grooming?: A Little Help from another person toileting, which includes using toliet, bedpan, or urinal?: A Lot Help from another person bathing (including washing, rinsing, drying)?: A Lot Help from another person to put on and taking off  regular upper body clothing?: A Little Help from another person to put on and taking off regular lower body clothing?: A Lot 6 Click Score: 15    End of Session    OT Visit Diagnosis: Unsteadiness on feet (R26.81);Other abnormalities of gait and mobility (R26.89);Muscle weakness (generalized) (M62.81);Hemiplegia and hemiparesis Hemiplegia - Right/Left: Left   Activity Tolerance Patient tolerated treatment well   Patient Left in bed;with call bell/phone within reach;with bed alarm set   Nurse Communication Mobility status        Time: 5945-8592 OT Time Calculation (min): 15 min  Charges: OT General Charges $OT Visit: 1 Visit OT Treatments $Therapeutic Exercise: 8-22 mins  Lynnda Child, OTD, OTR/L Acute Rehab [phone] - [place] 12/30/2021, 2:08 PM

## 2021-12-30 NOTE — TOC Initial Note (Signed)
Transition of Care [facility]) - Initial/Assessment Note    Patient Details  Name: Cody Macdonald MRN: [id number] Date of Birth: 09/08/1960  [facility] CM/SW Contact:    Benard Halsted, LCSW Phone Number: 12/30/2021, 8:56 AM  Clinical Narrative:                 Patient has resided at [facility] however now requires new dialysis. Per renal navigator, [facility] centers are not in network with [employer]. CSW following for options.   Expected Discharge Plan: Skilled Nursing Facility Barriers to Discharge: Other (must enter comment) (Needs SNF in [place] since [place] [facility] are not in network with his insurance)   Patient Goals and CMS Choice Patient states their goals for this hospitalization and ongoing recovery are:: SNF CMS Medicare.gov Compare Post Acute Care list provided to:: Patient Choice offered to / list presented to : Patient  Expected Discharge Plan and Services Expected Discharge Plan: [place] In-house Referral: Clinical Social Work   Post Acute Care Choice: [place], Dialysis Living arrangements for the past 2 months: [place]                                      Prior Living Arrangements/Services Living arrangements for the past 2 months: [place] Lives with:: Facility Resident Patient language and need for interpreter reviewed:: Yes Do you feel safe going back to the place where you live?: Yes      Need for Family Participation in Patient Care: No (Comment) Care giver support system in place?: No (comment)   Criminal Activity/Legal Involvement Pertinent to Current Situation/Hospitalization: No - Comment as needed  Activities of Daily Living Home Assistive Devices/Equipment: Wheelchair ADL Screening (condition at time of admission) Patient's cognitive ability adequate to safely complete daily activities?: Yes Is the patient deaf or have  difficulty hearing?: No Does the patient have difficulty seeing, even when wearing glasses/contacts?: No Does the patient have difficulty concentrating, remembering, or making decisions?: No Patient able to express need for assistance with ADLs?: Yes Does the patient have difficulty dressing or bathing?: No Independently performs ADLs?: Yes (appropriate for developmental age) Does the patient have difficulty walking or climbing stairs?: Yes Weakness of Legs: Left Weakness of Arms/Hands: Left  Permission Sought/Granted Permission sought to share information with : Facility Art therapist granted to share information with : Yes, Verbal Permission Granted     Permission granted to share info w AGENCY: SNFs        Emotional Assessment Appearance:: Appears stated age     Orientation: : Oriented to Self, Oriented to Place, Oriented to  Time, Oriented to Situation Alcohol / Substance Use: Not Applicable Psych Involvement: No (comment)  Admission diagnosis:  AKI (acute kidney injury) ([place]) [N17.9] Low hemoglobin [D64.9] Patient Active Problem List   Diagnosis Date Noted   Malnutrition of moderate degree 12/26/2021   Acute renal failure superimposed on stage 3b chronic kidney disease ([place]) 12/24/2021   Symptomatic anemia 12/24/2021   History of stroke 12/24/2021   Tobacco abuse 12/24/2021   Alcohol abuse 12/24/2021   Hypoalbuminemia 12/24/2021   AKI (acute kidney injury) ([place]) 12/24/2021   Abdominal distention 12/24/2021   Leg edema 12/24/2021   PCP:  Pcp, No Pharmacy:  No Pharmacies Listed    Social Determinants of Health (SDOH) Interventions    Readmission Risk Interventions  View : No data to display.

## 2021-12-31 DIAGNOSIS — E440 Moderate protein-calorie malnutrition: Secondary | ICD-10-CM | POA: Diagnosis not present

## 2021-12-31 DIAGNOSIS — N1832 Chronic kidney disease, stage 3b: Secondary | ICD-10-CM | POA: Diagnosis not present

## 2021-12-31 DIAGNOSIS — N179 Acute kidney failure, unspecified: Secondary | ICD-10-CM | POA: Diagnosis not present

## 2021-12-31 DIAGNOSIS — D649 Anemia, unspecified: Secondary | ICD-10-CM | POA: Diagnosis not present

## 2021-12-31 MED ORDER — PANTOPRAZOLE SODIUM 40 MG PO TBEC
40.0000 mg | DELAYED_RELEASE_TABLET | Freq: Two times a day (BID) | ORAL | Status: DC
  Administered 2021-12-31 – 2022-01-03 (×8): 40 mg via ORAL
  Filled 2021-12-31 (×9): qty 1

## 2021-12-31 MED ORDER — PANTOPRAZOLE SODIUM 40 MG PO TBEC: 40.0000 mg | DELAYED_RELEASE_TABLET | Freq: Two times a day (BID) | ORAL | Status: DC

## 2021-12-31 MED ORDER — ALBUTEROL SULFATE (2.5 MG/3ML) 0.083% IN NEBU
2.5000 mg | INHALATION_SOLUTION | Freq: Four times a day (QID) | RESPIRATORY_TRACT | Status: DC | PRN
Start: 2021-12-31 — End: 2022-01-04

## 2021-12-31 MED ORDER — ACETAMINOPHEN 325 MG PO TABS: 650.0000 mg | ORAL_TABLET | Freq: Four times a day (QID) | ORAL | Status: DC | PRN

## 2021-12-31 MED ORDER — ONDANSETRON HCL 4 MG/2ML IJ SOLN: 4.0000 mg | Freq: Four times a day (QID) | INTRAMUSCULAR | Status: DC | PRN

## 2021-12-31 MED ORDER — HYDRALAZINE HCL 50 MG PO TABS
50.0000 mg | ORAL_TABLET | Freq: Three times a day (TID) | ORAL | Status: DC
  Administered 2021-12-31 – 2022-01-04 (×12): 50 mg via ORAL
  Filled 2021-12-31 (×13): qty 1

## 2021-12-31 MED ORDER — POLYETHYLENE GLYCOL 3350 17 G PO PACK: 17.0000 g | PACK | Freq: Every day | ORAL | Status: DC | PRN

## 2021-12-31 NOTE — Progress Notes (Signed)
Patient ID: Cody Macdonald, male   DOB: 1961-06-22, 61 y.o.   MRN: [id number] [facility] Progress Note   Assessment/ Plan:   1.  Acute kidney injury on chronic kidney disease- Unclear if this is evolution to ESRD: Suspected to have had progression of chronic kidney disease and with development of uremic symptoms and started on hemodialysis 5/26 with second treatment on 5/27.  Unclear if this is end-stage renal disease or if he will have meaningful recovery of renal function.  The process is underway for OP HD unit placement (which will likely not be locally @[facility]  units due to insurance issues per renal navigator note of 5/30, does have two clinics in [place] that will accept his insurance however his current SNF will not transport him there, may need to establish at another SNF which is being looked into per SW/CM).  Will maintain HD on MWF schedule 2.  Symptomatic anemia: Unclear if he had any overt losses. Iron replete on labs, ESA started 5/29.Hgb from 10.7 to 8.7 on 5/30, s/p 1u prbc. would recommend checking a FOBT--will defer to primary service 3.  History of CVA: on DAPT, per primary 4.  Hypertension: Resume oral antihypertensive agents and monitor with ultrafiltration/hemodialysis. 5.  Chronic kidney disease-metabolic bone disease: Continue to monitor calcium and phosphorus levels, corrected cal WNL. On renvela 6.  Nutrition: Likely compromised by advancing chronic kidney disease and history of alcohol use.  Monitor on ONS. Push protein  Subjective:   No acute events. Tolerated HD yesterday, net UF 2L. No complaints today   Objective:   BP (!) 172/92 (BP Location: Right Arm)   Pulse 86   Temp 98.5 F (36.9 C) (Oral)   Resp 13   Ht 5\' 7"  (1.702 m)   Wt 53.6 kg   SpO2 96%   BMI 18.51 kg/m   Intake/Output Summary (Last 24 hours) at 12/31/2021 1109 Last data filed at 12/31/2021 0130 Gross per 24 hour  Intake --  Output 2200 ml  Net -2200 ml   Weight change:   Physical  Exam: Gen: nad, laying flat in bed, flat affect CVS: RRR, S1S2 Resp: CTA bl, unlabored [id number], NT Ext: no sig edema b/l LEs Neuro: awake, alert Dialysis access: RIJ TDC  Imaging: No results found.  Labs: BMET Recent Labs  Lab 12/24/21 1846 12/25/21 0231 12/26/21 0450 12/28/21 0059 12/29/21 0149  NA 142  --  138 136 135  K 4.6  --  4.3 4.2 3.9  CL 117*  --  107 106 103  CO2 12*  --  18* 24 25  GLUCOSE 99  --  81 115* 101*  BUN 100*  --  67* 62* 27*  CREATININE 13.84*  --  10.24* 8.59* 5.41*  CALCIUM 8.1*  --  8.3* 8.0* 8.1*  PHOS  --  8.3* 8.1* 5.7* 4.2   CBC Recent Labs  Lab 12/24/21 1846 12/25/21 0231 12/28/21 0059 12/28/21 1906 12/28/21 2008 12/29/21 0149  WBC 9.0   < > 8.0 7.0 8.5 7.8  NEUTROABS 5.8  --   --   --  7.2  --   HGB 6.9*   < > 7.0* 11.5* 10.2* 8.7*  HCT 21.0*   < > 21.5* 32.4* 31.0* 25.9*  MCV 94.2   < > 88.5 85.0 90.1 88.7  PLT 139*   < > 98* 114* 137* 104*   < > = values in this interval not displayed.    Medications:     amLODipine  10 mg Oral Daily   aspirin EC  81 mg Oral Daily   atorvastatin  80 mg Oral QHS   Chlorhexidine Gluconate Cloth  6 each Topical Q0600   clopidogrel  75 mg Oral Daily   darbepoetin (ARANESP) injection - DIALYSIS  100 mcg Intravenous Q Mon-HD   feeding supplement  237 mL Oral BID BM   heparin injection (subcutaneous)  5,000 Units Subcutaneous Q8H   hydrALAZINE  50 mg Oral Q8H   labetalol  300 mg Oral BID   melatonin  3 mg Oral QHS   multivitamin  1 tablet Oral QHS   pantoprazole  40 mg Oral Q1200   pneumococcal 20-valent conjugate vaccine  0.5 mL Intramuscular Tomorrow-1000   sevelamer carbonate  800 mg Oral TID WC   Gean Quint, MD [facility] 12/31/2021, 11:09 AM

## 2021-12-31 NOTE — Progress Notes (Signed)
Physical Therapy Treatment Patient Details Name: Cody Macdonald MRN: [id number] DOB: 07-21-1961 Today's Date: 12/31/2021   History of Present Illness 61 y.o. male Presented with  abnormal labs--low hemoglobin and acute on chronic renal failure.  PMH significant of CKD, CVA, HTN, Etoh abuse tobacco abuse, anemia. Resident at SNF x ~1 month s/p CVA; non-ambulatory    PT Comments    Patient remains with flat affect. Did significantly better with standing and pre-gait than anticipated, but pt clearly not content with his progress. Discussed it will be a process to start walking again, but today was a great move in the right direction.     Recommendations for follow up therapy are one component of a multi-disciplinary discharge planning process, led by the attending physician.  Recommendations may be updated based on patient status, additional functional criteria and insurance authorization.  Follow Up Recommendations  Skilled nursing-short term rehab (<3 hours/day)     Assistance Recommended at Discharge Intermittent Supervision/Assistance  Patient can return home with the following A little help with walking and/or transfers;Assistance with cooking/housework   Equipment Recommendations  None recommended by PT    Recommendations for Other Services       Precautions / Restrictions Precautions Precautions: Fall Restrictions Weight Bearing Restrictions: Yes     Mobility  Bed Mobility Overal bed mobility: Needs Assistance Bed Mobility: Supine to Sit, Sit to Supine     Supine to sit: Supervision     General bed mobility comments: supervision for safety    Transfers Overall transfer level: Needs assistance   Transfers: Bed to chair/wheelchair/BSC, Sit to/from Stand Sit to Stand: Min guard           General transfer comment: stood from EOB x 1; from stedy seat x 3 Transfer via Lift Equipment: Stedy  Ambulation/Gait             Pre-gait activities: wt-shifting  laterally; progressed to lifting each foot (while in stedy)     Chief Strategy Officer    Modified Rankin (Stroke Patients Only) Modified Rankin (Stroke Patients Only) Pre-Morbid Rankin Score: Severe disability Modified Rankin: Severe disability     Balance Overall balance assessment: Needs assistance Sitting-balance support: No upper extremity supported, Feet supported Sitting balance-Leahy Scale: Fair     Standing balance support: Bilateral upper extremity supported (pt placed his LUE on bar of stedy and able to maintain grip) Standing balance-Leahy Scale: Poor                              Cognition Arousal/Alertness: Awake/alert Behavior During Therapy: Flat affect Overall Cognitive Status: Within Functional Limits for tasks assessed                                          Exercises      General Comments        Pertinent Vitals/Pain Pain Assessment Pain Assessment: No/denies pain    Home Living                          Prior Function            PT Goals (current goals can now be found in the care plan section) Acute Rehab PT Goals Patient Stated Goal: wants  to be able to walk prior to returning home Time For Goal Achievement: 01/11/22 Potential to Achieve Goals: Fair Progress towards PT goals: Progressing toward goals    Frequency    Min 2X/week      PT Plan Current plan remains appropriate    Co-evaluation              AM-PAC PT "6 Clicks" Mobility   Outcome Measure  Help needed turning from your back to your side while in a flat bed without using bedrails?: A Little Help needed moving from lying on your back to sitting on the side of a flat bed without using bedrails?: A Little Help needed moving to and from a bed to a chair (including a wheelchair)?: A Little Help needed standing up from a chair using your arms (e.g., wheelchair or bedside chair)?: A Lot Help needed to  walk in hospital room?: Total Help needed climbing 3-5 steps with a railing? : Total 6 Click Score: 13    End of Session Equipment Utilized During Treatment:  (refuses gait belt) Activity Tolerance: Patient tolerated treatment well Patient left: with call bell/phone within reach;in chair Nurse Communication: Mobility status;Need for lift equipment (stedy) PT Visit Diagnosis: Hemiplegia and hemiparesis Hemiplegia - Right/Left: Left Hemiplegia - dominant/non-dominant: Non-dominant Hemiplegia - caused by: Cerebral infarction     Time: 1228-1252 PT Time Calculation (min) (ACUTE ONLY): 24 min  Charges:  $Gait Training: 23-37 mins                      Arby Barrette, PT Acute Rehabilitation Services  Pager [phone] Office [phone]    Rexanne Mano 12/31/2021, 1:01 PM

## 2021-12-31 NOTE — Progress Notes (Signed)
PROGRESS NOTE        PATIENT DETAILS Name: Cody Macdonald Age: 61 y.o. Sex: male Date of Birth: 30-Nov-1960 Admit Date: 12/24/2021 Admitting Physician Toy Baker, MD PCP:Pcp, No  Brief Summary: Patient is a 61 y.o.  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  See below for further details.  Significant events: 5/25>> referred to ED from [facility]-progressive renal failure-concern for ESRD, severe anemia.  Admit to [facility]. 5/29>> fever Tmax 100.8-blood culture sent.  Significant studies: 5/25>> CXR: Small right pleural effusion-right basilar subsegmental atelectasis 5/26>> CT abdomen: Small to moderate free fluid in the pelvis, bilateral pleural effusions.  No evidence of SBO. 5/27>> bilateral lower extremity Doppler: No DVT. 5/29>> CXR: No obvious PNA  Significant microbiology data: 5/29>> blood culture: No growth  Procedures: 5/26>> tunneled HD catheter  Consults: Nephrology, IR  Subjective: Complains of left upper quadrant pain today.  Had similar pain on 5/26-CT abdomen was negative.  Objective: Vitals: Blood pressure (!) 172/92, pulse 86, temperature 98.5 F (36.9 C), temperature source Oral, resp. rate 13, height 5\' 7"  (1.702 m), weight 53.6 kg, SpO2 96 %.   Exam: Gen Exam:Alert awake-not in any distress HEENT:atraumatic, normocephalic Chest: B/L clear to auscultation anteriorly CVS:S1S2 regular Abdomen:soft non tender, non distended Extremities:no edema Neurology: Non focal Skin: no rash    Pertinent Labs/Radiology:    Latest Ref Rng & Units 12/29/2021    1:49 AM 12/28/2021    8:08 PM 12/28/2021    7:06 PM  CBC  WBC 4.0 - 10.5 K/uL 7.8   8.5   7.0    Hemoglobin 13.0 - 17.0 g/dL 8.7   10.2   11.5    Hematocrit 39.0 - 52.0 % 25.9   31.0   32.4    Platelets 150 - 400 K/uL 104   137   114       Lab Results  Component Value Date   NA 135 12/29/2021   K 3.9 12/29/2021   CL 103 12/29/2021   CO2 25 12/29/2021      Assessment/Plan: AKI on CKD stage IV-likely progression to ESRD: Started on HD this admit-tolerating well so far-nephrology following-arrangements being made for for outpatient HD.    Normocytic anemia: Likely due to CKD-no evidence of blood loss (brown stools several days back).  Hemoglobin stable-has required a total of 3 units of PRBC so far.  Aranesp/IV iron deferred to nephrology.   Fever on 5/29: No fever since then-no foci of infection apparent-blood cultures negative so far.  Being monitored off antimicrobial therapy.    Left upper abdominal pain: Reoccurred this morning-CT on 5/26 when he had similar pain was without any significant abnormalities.  Will try narcotics-if reoccurs-in spite of narcotic use-we can contemplate further work-up.  His last BM was yesterday.     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   Per patient-he was discharged from [facility] to SNF-where he still resides.  He has not ambulated and is mostly wheelchair-bound.  HTN: BP remains on the higher side-hydralazine dosage has been adjusted today-continue labetalol/Norvasc.    HLD: On statin.  Right> left lower extremity edema: Edema has resolved-Doppler studies negative for DVT.  History of alcohol use: Claims no further alcohol  use since hospitalization in April for CVA.  Resides at [facility].  Tobacco abuse: Counseled-on transdermal nicotine  Nutrition Status: Nutrition Problem: Moderate Malnutrition Etiology: chronic illness (CKD, stroke) Signs/Symptoms: mild fat depletion, moderate muscle depletion Interventions: Ensure Enlive (each supplement provides 350kcal and 20 grams of protein), MVI, Liberalize Diet, Refer to RD note for recommendations   BMI Estimated body mass index is 18.51 kg/m as calculated  from the following:   Height as of this encounter: 5\' 7"  (1.702 m).   Weight as of this encounter: 53.6 kg.   Code status:   Code Status: Full Code   DVT Prophylaxis: SQ heparin  Family Communication: None at bedside   Disposition Plan: Status is: Inpatient Remains inpatient appropriate because: Likely progression to ESRD-started on HD this admission-clipping process in progress.  Hopefully SNF in the next day or so if he remains afebrile and cultures remain negative.   Planned Discharge Destination:Skilled nursing facility   Diet: Diet Order             Diet regular Room service appropriate? Yes; Fluid consistency: Thin; Fluid restriction: 1200 mL Fluid  Diet effective now                     Antimicrobial agents: Anti-infectives (From admission, onward)    Start     Dose/Rate Route Frequency Ordered Stop   12/25/21 1015  ceFAZolin (ANCEF) IVPB 2g/100 mL premix        2 g 200 mL/hr over 30 Minutes Intravenous To Radiology 12/25/21 0943 12/25/21 1116        MEDICATIONS: Scheduled Meds:  amLODipine  10 mg Oral Daily   aspirin EC  81 mg Oral Daily   atorvastatin  80 mg Oral QHS   Chlorhexidine Gluconate Cloth  6 each Topical Q0600   clopidogrel  75 mg Oral Daily   darbepoetin (ARANESP) injection - DIALYSIS  100 mcg Intravenous Q Mon-HD   feeding supplement  237 mL Oral BID BM   heparin injection (subcutaneous)  5,000 Units Subcutaneous Q8H   hydrALAZINE  50 mg Oral Q8H   labetalol  300 mg Oral BID   melatonin  3 mg Oral QHS   multivitamin  1 tablet Oral QHS   pantoprazole  40 mg Oral Q1200   pneumococcal 20-valent conjugate vaccine  0.5 mL Intramuscular Tomorrow-1000   sevelamer carbonate  800 mg Oral TID WC   Continuous Infusions:   PRN Meds:.hydrALAZINE, HYDROmorphone (DILAUDID) injection   I have personally reviewed following labs and imaging studies  LABORATORY DATA: CBC: Recent Labs  Lab 12/24/21 1846 12/25/21 0231 12/26/21 0450  12/28/21 0059 12/28/21 1906 12/28/21 2008 12/29/21 0149  WBC 9.0   < > 11.8* 8.0 7.0 8.5 7.8  NEUTROABS 5.8  --   --   --   --  7.2  --   HGB 6.9*   < > 8.8* 7.0* 11.5* 10.2* 8.7*  HCT 21.0*   < > 25.2* 21.5* 32.4* 31.0* 25.9*  MCV 94.2   < > 86.0 88.5 85.0 90.1 88.7  PLT 139*   < > 105* 98* 114* 137* 104*   < > = values in this interval not displayed.     Basic Metabolic Panel: Recent Labs  Lab 12/24/21 1846 12/25/21 0231 12/26/21 0450 12/28/21 0059 12/29/21 0149  NA 142  --  138 136 135  K 4.6  --  4.3 4.2 3.9  CL 117*  --  107 106 103  CO2 12*  --  18* 24 25  GLUCOSE 99  --  81 115* 101*  BUN 100*  --  67* 62* 27*  CREATININE 13.84*  --  10.24* 8.59* 5.41*  CALCIUM 8.1*  --  8.3* 8.0* 8.1*  MG  --  1.8  --   --   --   PHOS  --  8.3* 8.1* 5.7* 4.2     GFR: Estimated Creatinine Clearance: 11 mL/min (A) (by C-G formula based on SCr of 5.41 mg/dL (H)).  Liver Function Tests: Recent Labs  Lab 12/24/21 1846 12/26/21 0450 12/28/21 0059 12/29/21 0149  AST 9*  --   --   --   ALT 8  --   --   --   ALKPHOS 90  --   --   --   BILITOT 0.5  --   --   --   PROT 6.8  --   --   --   ALBUMIN 2.4* 2.0* 1.9* 1.9*    No results for input(s): LIPASE, AMYLASE in the last 168 hours. No results for input(s): AMMONIA in the last 168 hours.  Coagulation Profile: Recent Labs  Lab 12/24/21 1846  INR 1.1     Cardiac Enzymes: Recent Labs  Lab 12/25/21 0231  CKTOTAL 119     BNP (last 3 results) No results for input(s): PROBNP in the last 8760 hours.  Lipid Profile: No results for input(s): CHOL, HDL, LDLCALC, TRIG, CHOLHDL, LDLDIRECT in the last 72 hours.   Thyroid Function Tests: No results for input(s): TSH, T4TOTAL, FREET4, T3FREE, THYROIDAB in the last 72 hours.   Anemia Panel: No results for input(s): VITAMINB12, FOLATE, FERRITIN, TIBC, IRON, RETICCTPCT in the last 72 hours.   Urine analysis: No results found for: COLORURINE, APPEARANCEUR, LABSPEC,  PHURINE, GLUCOSEU, HGBUR, BILIRUBINUR, KETONESUR, PROTEINUR, UROBILINOGEN, NITRITE, LEUKOCYTESUR  Sepsis Labs: Lactic Acid, Venous    Component Value Date/Time   LATICACIDVEN 1.0 12/28/2021 2222    MICROBIOLOGY: Recent Results (from the past 240 hour(s))  Culture, blood (Routine X 2) w Reflex to ID Panel     Status: None (Preliminary result)   Collection Time: 12/28/21  8:12 PM   Specimen: BLOOD RIGHT HAND  Result Value Ref Range Status   Specimen Description BLOOD RIGHT HAND  Final   Special Requests   Final    BOTTLES DRAWN AEROBIC AND ANAEROBIC Blood Culture adequate volume   Culture   Final    NO GROWTH 2 DAYS Performed at [facility], [place] [address], [place], [place] [address]    Report Status PENDING  Incomplete  Culture, blood (Routine X 2) w Reflex to ID Panel     Status: None (Preliminary result)   Collection Time: 12/28/21  8:12 PM   Specimen: BLOOD  Result Value Ref Range Status   Specimen Description BLOOD RIGHT ANTECUBITAL  Final   Special Requests   Final    BOTTLES DRAWN AEROBIC AND ANAEROBIC Blood Culture adequate volume   Culture   Final    NO GROWTH 2 DAYS Performed at [facility], [place] [address], [place], [place] [address]    Report Status PENDING  Incomplete    RADIOLOGY STUDIES/RESULTS: No results found.   LOS: 7 days   Oren Binet, MD  [facility]    To contact the attending provider between 7A-7P or the covering provider during after hours 7P-7A, please log into the web site [email or web address] and access using universal [place] password for that web site. If you  do not have the password, please call the hospital operator.  12/31/2021, 11:39 AM

## 2021-12-31 NOTE — TOC Progression Note (Addendum)
Transition of Care [facility]) - Progression Note    Patient Details  Name: Cody Macdonald MRN: [id number] Date of Birth: [birth year]  Transition of Care [facility] CM/SW [place], LCSW Phone Number: 12/31/2021, 9:53 AM  Clinical Narrative:    9:53am-CSW left message for [facility] again to inquire on status of if they can accommodate transportation to [facility].   2pm-Christine with [facility] (new admissions liaison) reported agreement that they can transport patient to [facility]. Renal Navigator securing chair time at [place].    Expected Discharge Plan: Skilled Nursing Facility Barriers to Discharge: Other (must enter comment) (Needs SNF in [place] since [place] [facility] are not in network with his insurance)  Expected Discharge Plan and Services Expected Discharge Plan: [place] In-house Referral: Clinical Social Work   Post Acute Care Choice: [place], Dialysis Living arrangements for the past 2 months: [place]                                       Social Determinants of Health (SDOH) Interventions    Readmission Risk Interventions     View : No data to display.

## 2021-12-31 NOTE — Progress Notes (Signed)
[place] and left a message for [facility] requesting a return call to provide update. Contacted CSW this am and she is awaiting response from pt's snf regarding transport to HD in [place]. Will follow and assist.   Melven Sartorius Renal Navigator [phone]

## 2021-12-31 NOTE — Plan of Care (Signed)
  Problem: Education: Goal: Knowledge of General Education information will improve Description: Including pain rating scale, medication(s)/side effects and non-pharmacologic comfort measures Outcome: Progressing   Problem: Education: Goal: Knowledge of General Education information will improve Description: Including pain rating scale, medication(s)/side effects and non-pharmacologic comfort measures Outcome: Progressing   Problem: Activity: Goal: Risk for activity intolerance will decrease Outcome: Progressing   Problem: Nutrition: Goal: Adequate nutrition will be maintained Outcome: Progressing   Problem: Pain Managment: Goal: General experience of comfort will improve Outcome: Progressing   Problem: Safety: Goal: Ability to remain free from injury will improve Outcome: Progressing

## 2022-01-01 DIAGNOSIS — N179 Acute kidney failure, unspecified: Secondary | ICD-10-CM | POA: Diagnosis not present

## 2022-01-01 DIAGNOSIS — D649 Anemia, unspecified: Secondary | ICD-10-CM | POA: Diagnosis not present

## 2022-01-01 DIAGNOSIS — N1832 Chronic kidney disease, stage 3b: Secondary | ICD-10-CM | POA: Diagnosis not present

## 2022-01-01 LAB — CBC
HCT: 25.1 % — ABNORMAL LOW (ref 39.0–52.0)
Hemoglobin: 8.3 g/dL — ABNORMAL LOW (ref 13.0–17.0)
MCH: 29.9 pg (ref 26.0–34.0)
MCHC: 33.1 g/dL (ref 30.0–36.0)
MCV: 90.3 fL (ref 80.0–100.0)
Platelets: 130 10*3/uL — ABNORMAL LOW (ref 150–400)
RBC: 2.78 MIL/uL — ABNORMAL LOW (ref 4.22–5.81)
RDW: 14.7 % (ref 11.5–15.5)
WBC: 6.4 10*3/uL (ref 4.0–10.5)
nRBC: 0 % (ref 0.0–0.2)

## 2022-01-01 MED ORDER — OXYCODONE-ACETAMINOPHEN 5-325 MG PO TABS: 1.0000 | ORAL_TABLET | ORAL | 0 refills | Status: DC | PRN

## 2022-01-01 MED ORDER — SEVELAMER CARBONATE 800 MG PO TABS: 800.0000 mg | ORAL_TABLET | Freq: Three times a day (TID) | ORAL | Status: DC

## 2022-01-01 MED ORDER — ALBUTEROL SULFATE (2.5 MG/3ML) 0.083% IN NEBU: 2.5000 mg | INHALATION_SOLUTION | Freq: Four times a day (QID) | RESPIRATORY_TRACT | 12 refills | Status: AC | PRN

## 2022-01-01 MED ORDER — RENA-VITE PO TABS: 1.0000 | ORAL_TABLET | Freq: Every day | ORAL | 0 refills | Status: DC

## 2022-01-01 MED ORDER — LABETALOL HCL 300 MG PO TABS: 300.0000 mg | ORAL_TABLET | Freq: Two times a day (BID) | ORAL | Status: DC

## 2022-01-01 MED ORDER — ENSURE ENLIVE PO LIQD: 237.0000 mL | Freq: Two times a day (BID) | ORAL | 12 refills | Status: DC

## 2022-01-01 MED ORDER — DARBEPOETIN ALFA 100 MCG/0.5ML IJ SOSY
100.0000 ug | PREFILLED_SYRINGE | INTRAMUSCULAR | Status: DC
  Administered 2022-01-01: 100 ug via INTRAVENOUS
  Filled 2022-01-01 (×2): qty 0.5

## 2022-01-01 MED ORDER — HYDRALAZINE HCL 50 MG PO TABS: 50.0000 mg | ORAL_TABLET | Freq: Three times a day (TID) | ORAL | Status: DC

## 2022-01-01 MED ORDER — OXYCODONE-ACETAMINOPHEN 5-325 MG PO TABS: 1.0000 | ORAL_TABLET | Freq: Three times a day (TID) | ORAL | 0 refills | Status: DC | PRN

## 2022-01-01 MED ORDER — AMLODIPINE BESYLATE 10 MG PO TABS: 10.0000 mg | ORAL_TABLET | Freq: Every day | ORAL | Status: DC

## 2022-01-01 MED ORDER — HEPARIN SODIUM (PORCINE) 1000 UNIT/ML IJ SOLN
INTRAMUSCULAR | Status: AC
  Filled 2022-01-01: qty 3

## 2022-01-01 NOTE — Progress Notes (Signed)
Patient ID: Cody Macdonald, male   DOB: 1961/07/15, 61 y.o.   MRN: [id number] [facility] Progress Note   Assessment/ Plan:   1.  Acute kidney injury on chronic kidney disease- Unclear if this is evolution to ESRD: Suspected to have had progression of chronic kidney disease and with development of uremic symptoms and started on hemodialysis 5/26 with second treatment on 5/27.  Unclear if this is end-stage renal disease or if he will have meaningful recovery of renal function.  The process is underway for OP HD unit placement and transport.  Will maintain HD on MWF schedule 2.  Symptomatic anemia: Unclear if he had any overt losses. Iron replete on labs, ESA started 5/29.Hgb from 10.7 to 8.7 on 5/30, s/p 1u prbc, 8.3 today. would recommend checking a FOBT--will defer to primary service 3.  History of CVA: on DAPT, per primary 4.  Hypertension: Resume oral antihypertensive agents and monitor with ultrafiltration/hemodialysis. 5.  Chronic kidney disease-metabolic bone disease: Continue to monitor calcium and phosphorus levels, corrected cal WNL. On renvela 6.  Nutrition: Likely compromised by advancing chronic kidney disease and history of alcohol use.  Monitor on ONS. Push protein  Subjective:   Patient seen and examined on HD. Tolerating treatment, no complaints.   Objective:   BP (!) 163/91 Comment: Simultaneous filing. User may not have seen previous data.  Pulse 77   Temp 98.2 F (36.8 C)   Resp 18   Ht 5\' 7"  (1.702 m)   Wt 54.3 kg   SpO2 100%   BMI 18.75 kg/m   Intake/Output Summary (Last 24 hours) at 01/01/2022 1310 Last data filed at 01/01/2022 0300 Gross per 24 hour  Intake --  Output 100 ml  Net -100 ml   Weight change:   Physical Exam: Gen: nad, laying flat in bed, flat affect CVS: RRR, S1S2 Resp: CTA bl, unlabored GLO:VFIE, NT Ext: no sig edema b/l LEs Neuro: awake, alert Dialysis access: RIJ TDC  Imaging: No results found.  Labs: BMET Recent Labs   Lab 12/26/21 0450 12/28/21 0059 12/29/21 0149  NA 138 136 135  K 4.3 4.2 3.9  CL 107 106 103  CO2 18* 24 25  GLUCOSE 81 115* 101*  BUN 67* 62* 27*  CREATININE 10.24* 8.59* 5.41*  CALCIUM 8.3* 8.0* 8.1*  PHOS 8.1* 5.7* 4.2   CBC Recent Labs  Lab 12/28/21 1906 12/28/21 2008 12/29/21 0149 01/01/22 0046  WBC 7.0 8.5 7.8 6.4  NEUTROABS  --  7.2  --   --   HGB 11.5* 10.2* 8.7* 8.3*  HCT 32.4* 31.0* 25.9* 25.1*  MCV 85.0 90.1 88.7 90.3  PLT 114* 137* 104* 130*    Medications:     amLODipine  10 mg Oral Daily   aspirin EC  81 mg Oral Daily   atorvastatin  80 mg Oral QHS   Chlorhexidine Gluconate Cloth  6 each Topical Q0600   clopidogrel  75 mg Oral Daily   darbepoetin (ARANESP) injection - DIALYSIS  100 mcg Intravenous Q Fri-HD   feeding supplement  237 mL Oral BID BM   heparin injection (subcutaneous)  5,000 Units Subcutaneous Q8H   heparin sodium (porcine)       hydrALAZINE  50 mg Oral Q8H   labetalol  300 mg Oral BID   melatonin  3 mg Oral QHS   multivitamin  1 tablet Oral QHS   pantoprazole  40 mg Oral BID   pneumococcal 20-valent conjugate vaccine  0.5 mL  Intramuscular Tomorrow-1000   sevelamer carbonate  800 mg Oral TID WC   Gean Quint, MD [facility] 01/01/2022, 1:10 PM

## 2022-01-01 NOTE — Discharge Summary (Addendum)
PATIENT DETAILS Name: Cody Macdonald Age: 61 y.o. Sex: male Date of Birth: 12/08/1960 MRN: [id number]. Admitting Physician: Toy Baker, MD PCP:Pcp, No  Admit Date: 12/24/2021 Discharge date: 01/04/2022  Recommendations for Outpatient Follow-up:  Follow up with PCP in 1-2 weeks Please obtain CMP/CBC in one week Please ensure follow-up at HD clinic   Admitted From:  SNF  Disposition: Skilled nursing facility   Discharge Condition: fair  CODE STATUS:   Code Status: Full Code   Diet recommendation:  Diet Order             Diet regular Room service appropriate? Yes; Fluid consistency: Thin; Fluid restriction: 1200 mL Fluid  Diet effective now                    Brief Summary:  Patient is a 61 y.o.  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  See below for further details.   Significant events: 5/25>> referred to ED from [facility]-progressive renal failure-concern for ESRD, severe anemia.  Admit to [facility]. 5/29>> fever Tmax 100.8-blood culture sent.   Significant studies: 5/25>> CXR: Small right pleural effusion-right basilar subsegmental atelectasis 5/26>> CT abdomen: Small to moderate free fluid in the pelvis, bilateral pleural effusions.  No evidence of SBO. 5/27>> bilateral lower extremity Doppler: No DVT. 5/29>> CXR: No obvious PNA   Significant microbiology data: 5/29>> blood culture: No growth   Procedures: 5/26>> R. IJ. Tunneled HD catheter   Consults: Nephrology, IR    Brief Hospital Course:  AKI on CKD stage IV-likely progression to ESRD: Started on HD this admit-tolerating well so far-nephrology following-arrangements being made for for outpatient HD.  He was dialyzed on 01/04/2022 prior to discharge.  Case discussed with the nephrologist, he is set up for outpatient dialysis.  MWF schedule. R.IJ HD  Catheter.   Normocytic anemia: Likely due to CKD-no evidence of blood loss (brown stools several days back).  Hemoglobin stable-has required a total of 3 units of PRBC so far.  Aranesp/IV iron deferred to nephrology.  Continue to follow CBC closely in the outpatient setting.   Fever on 5/29: No fever since then-no foci of infection apparent-blood cultures negative so far.  Being monitored off antimicrobial therapy.     Left upper abdominal pain: Unclear etiology-has pain in the left upper abdomen area intermittently-CT abdomen on 5/26 nonacute.  Continue as needed narcotics.  Abdominal exam is benign-he does not have any further pain.   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   Per patient-he was discharged from [facility] to SNF-where he still resides.  He has not ambulated and is mostly wheelchair-bound.   HTN: BP better controlled-continue hydralazine/labetalol/Norvasc.  Remains on the higher side-hydralazine dosage has been adjusted continue labetalol/Norvasc.     HLD: On statin.   Right> left lower extremity edema: Edema has resolved-Doppler studies negative for DVT.   History of alcohol use: Claims no further alcohol use since hospitalization in April for CVA.  Resides at [facility].   Tobacco abuse: Counseled-on transdermal nicotine   Nutrition Status: Nutrition Problem: Moderate Malnutrition Etiology: chronic illness (CKD, stroke) Signs/Symptoms: mild fat depletion, moderate muscle depletion Interventions: Ensure Enlive (each supplement provides 350kcal and 20 grams of protein), MVI, Liberalize Diet, Refer to RD note for recommendations   BMI Estimated body mass index is 18.51 kg/m as calculated from the following:  Height as of this encounter: 5\' 7"  (1.702 m).   Weight as of this encounter: 53.6 kg.    Discharge Diagnoses:  Active Problems:   Acute renal failure superimposed on stage 3b chronic kidney disease  (HCC)   Symptomatic anemia   History of stroke   Tobacco abuse   Alcohol abuse   Hypoalbuminemia   Abdominal distention   Leg edema   Malnutrition of moderate degree   Discharge Instructions:  Activity:  As tolerated with Full fall precautions use walker/cane & assistance as needed  Discharge Instructions     Discharge instructions   Complete by: As directed    Follow with Primary MD Pcp, No in 7 days   Get CBC, CMP, 2 view Chest X ray -  checked next visit within 1 week by Primary MD SNF MD   Activity: As tolerated with Full fall precautions use walker/cane & assistance as needed  Disposition SNF  Diet: - Renal, 1200 mL Fluid  Diet effective now         Special Instructions: If you have smoked or chewed Tobacco  in the last 2 yrs please stop smoking, stop any regular Alcohol  and or any Recreational drug use.  On your next visit with your primary care physician please Get Medicines reviewed and adjusted.  Please request your Prim.MD to go over all Hospital Tests and Procedure/Radiological results at the follow up, please get all Hospital records sent to your Prim MD by signing hospital release before you go home.  If you experience worsening of your admission symptoms, develop shortness of breath, life threatening emergency, suicidal or homicidal thoughts you must seek medical attention immediately by calling 911 or calling your MD immediately  if symptoms less severe.  You Must read complete instructions/literature along with all the possible adverse reactions/side effects for all the Medicines you take and that have been prescribed to you. Take any new Medicines after you have completely understood and accpet all the possible adverse reactions/side effects.   Increase activity slowly   Complete by: As directed    Increase activity slowly   Complete by: As directed       Allergies as of 01/04/2022   No Known Allergies      Medication List     STOP taking these  medications    NIFEdipine 10 MG capsule Commonly known as: PROCARDIA       TAKE these medications    albuterol (2.5 MG/3ML) 0.083% nebulizer solution Commonly known as: PROVENTIL Take 3 mLs (2.5 mg total) by nebulization every 6 (six) hours as needed for wheezing or shortness of breath.   amLODipine 10 MG tablet Commonly known as: NORVASC Take 1 tablet (10 mg total) by mouth daily.   aspirin EC 81 MG tablet Take 81 mg by mouth daily. Swallow whole.   atorvastatin 80 MG tablet Commonly known as: LIPITOR Take 80 mg by mouth at bedtime.   clopidogrel 75 MG tablet Commonly known as: PLAVIX Take 75 mg by mouth daily.   ergocalciferol 1.25 MG (50000 UT) capsule Commonly known as: VITAMIN D2 Take 50,000 Units by mouth every Tuesday.   feeding supplement Liqd Take 237 mLs by mouth 2 (two) times daily between meals.   hydrALAZINE 50 MG tablet Commonly known as: APRESOLINE Take 1 tablet (50 mg total) by mouth every 8 (eight) hours.   labetalol 300 MG tablet Commonly known as: NORMODYNE Take 1 tablet (300 mg total) by mouth 2 (two) times daily. What changed:  medication strength how much to take   melatonin 3 MG Tabs tablet Take 3 mg by mouth at bedtime as needed (sleep).   multivitamin Tabs tablet Take 1 tablet by mouth at bedtime.   oxyCODONE-acetaminophen 5-325 MG tablet Commonly known as: Percocet Take 1 tablet by mouth every 8 (eight) hours as needed for severe pain.   pantoprazole 40 MG tablet Commonly known as: PROTONIX Take 40 mg by mouth daily.   sevelamer carbonate 800 MG tablet Commonly known as: RENVELA Take 1 tablet (800 mg total) by mouth 3 (three) times daily with meals.        Follow-up Information     Primary care MD. Schedule an appointment as soon as possible for a visit in 1 week(s).                 No Known Allergies   Other Procedures/Studies: CT ABDOMEN PELVIS WO CONTRAST  Result Date: 12/25/2021 CLINICAL DATA:   Abdominal pain, acute, nonlocalized LUQ pain -Xray neg EXAM: CT ABDOMEN AND PELVIS WITHOUT CONTRAST TECHNIQUE: Multidetector CT imaging of the abdomen and pelvis was performed following the standard protocol without IV contrast. RADIATION DOSE REDUCTION: This exam was performed according to the departmental dose-optimization program which includes automated exposure control, adjustment of the mA and/or kV according to patient size and/or use of iterative reconstruction technique. COMPARISON:  None Available. FINDINGS: Lower chest: Bilateral pleural effusions, right larger than left. Compressive atelectasis in the lower lobes. Heart is normal size. Hepatobiliary: No focal hepatic abnormality. Gallbladder unremarkable. Pancreas: No focal abnormality or ductal dilatation. Spleen: No focal abnormality.  Normal size. Adrenals/Urinary Tract: No adrenal abnormality. No focal renal abnormality. No stones or hydronephrosis. Urinary bladder is unremarkable. Stomach/Bowel: Normal appendix. Stomach, large and small bowel grossly unremarkable. Vascular/Lymphatic: Aortic atherosclerosis. No evidence of aneurysm or adenopathy. Reproductive: No visible focal abnormality. Other: Small to moderate free fluid in the pelvis. Musculoskeletal: No acute bony abnormality. IMPRESSION: Small to moderate free fluid in the pelvis of unknown etiology. Bilateral pleural effusions, right greater than left. Compressive atelectasis in the lower lobes. Aortic atherosclerosis. Electronically Signed   By: Rolm Baptise M.D.   On: 12/25/2021 21:10   DG Abd 1 View  Result Date: 12/24/2021 CLINICAL DATA:  Abdominal distension EXAM: ABDOMEN - 1 VIEW COMPARISON:  None Available. FINDINGS: The bowel gas pattern is normal. No radio-opaque calculi or other significant radiographic abnormality are seen. IMPRESSION: Negative. Electronically Signed   By: Rolm Baptise M.D.   On: 12/24/2021 21:37   IR Fluoro Guide CV Line Right  Result Date:  12/25/2021 INDICATION: Renal failure EXAM: Ultrasound and fluoroscopy guided tunneled right IJ hemodialysis catheter placement MEDICATIONS: Ancef 2 g IV; The antibiotic was administered within an appropriate time interval prior to skin puncture. ANESTHESIA/SEDATION: Moderate (conscious) sedation was employed during this procedure. A total of Versed 1 mg and Fentanyl 50 mcg was administered intravenously by the radiology nurse. Total intra-service moderate Sedation Time: 10 minutes. The patient's level of consciousness and vital signs were monitored continuously by radiology nursing throughout the procedure under my direct supervision. FLUOROSCOPY: Radiation Exposure Index (as provided by the fluoroscopic device): 0.6 mGy Kerma COMPLICATIONS: None immediate. PROCEDURE: Informed written consent was obtained from the patient after a thorough discussion of the procedural risks, benefits and alternatives. All questions were addressed. Maximal Sterile Barrier Technique was utilized including caps, mask, sterile gowns, sterile gloves, sterile drape, hand hygiene and skin antiseptic. A timeout was performed prior to the initiation of the procedure.  The right internal jugular vein was evaluated with ultrasound and shown to be patent. A permanent ultrasound image was obtained and placed in the patient's medical record. Using sterile gel and a sterile probe cover, the right internal jugular vein was entered with a 21 ga needle during real time ultrasound guidance. 0.018 inch guidewire placed and 21 ga needle exchanged for transitional dilator set. Utilizing fluoroscopy, 0.035 inch guidewire advanced through the needle without difficulty. Seriel dilation was performed and peel-away sheath was placed. Attention then turned to the right anterior upper chest. Following local lidocaine administration, the hemodialysis catheter was tunneled from the chest wall to the venotomy site. The catheter was inserted through the peel-away  sheath. The tip of the catheter was positioned within the right atrium using fluoroscopic guidance. All lumens of the catheter aspirated and flushed well. The dialysis lumens were locked with Heparin. The catheter was secured to the skin with suture. The insertion site was covered with sterile dressing. IMPRESSION: 19 cm right IJ tunneled hemodialysis catheter is ready for use. Electronically Signed   By: Miachel Roux M.D.   On: 12/25/2021 15:10   IR US Guide Vasc Access Right  Result Date: 12/25/2021 INDICATION: Renal failure EXAM: Ultrasound and fluoroscopy guided tunneled right IJ hemodialysis catheter placement MEDICATIONS: Ancef 2 g IV; The antibiotic was administered within an appropriate time interval prior to skin puncture. ANESTHESIA/SEDATION: Moderate (conscious) sedation was employed during this procedure. A total of Versed 1 mg and Fentanyl 50 mcg was administered intravenously by the radiology nurse. Total intra-service moderate Sedation Time: 10 minutes. The patient's level of consciousness and vital signs were monitored continuously by radiology nursing throughout the procedure under my direct supervision. FLUOROSCOPY: Radiation Exposure Index (as provided by the fluoroscopic device): 0.6 mGy Kerma COMPLICATIONS: None immediate. PROCEDURE: Informed written consent was obtained from the patient after a thorough discussion of the procedural risks, benefits and alternatives. All questions were addressed. Maximal Sterile Barrier Technique was utilized including caps, mask, sterile gowns, sterile gloves, sterile drape, hand hygiene and skin antiseptic. A timeout was performed prior to the initiation of the procedure. The right internal jugular vein was evaluated with ultrasound and shown to be patent. A permanent ultrasound image was obtained and placed in the patient's medical record. Using sterile gel and a sterile probe cover, the right internal jugular vein was entered with a 21 ga needle during  real time ultrasound guidance. 0.018 inch guidewire placed and 21 ga needle exchanged for transitional dilator set. Utilizing fluoroscopy, 0.035 inch guidewire advanced through the needle without difficulty. Seriel dilation was performed and peel-away sheath was placed. Attention then turned to the right anterior upper chest. Following local lidocaine administration, the hemodialysis catheter was tunneled from the chest wall to the venotomy site. The catheter was inserted through the peel-away sheath. The tip of the catheter was positioned within the right atrium using fluoroscopic guidance. All lumens of the catheter aspirated and flushed well. The dialysis lumens were locked with Heparin. The catheter was secured to the skin with suture. The insertion site was covered with sterile dressing. IMPRESSION: 19 cm right IJ tunneled hemodialysis catheter is ready for use. Electronically Signed   By: Miachel Roux M.D.   On: 12/25/2021 15:10   DG CHEST PORT 1 VIEW  Result Date: 12/28/2021 CLINICAL DATA:  Fever EXAM: PORTABLE CHEST 1 VIEW COMPARISON:  12/24/2021 FINDINGS: Single frontal view of the chest demonstrates right internal jugular central venous catheter tip overlying superior vena cava. Cardiac silhouette is  unremarkable. Minimal right basilar consolidation and trace right pleural effusion are noted. No pneumothorax. Left chest is clear. No acute bony abnormalities. IMPRESSION: 1. Small right pleural effusion with patchy right basilar consolidation, not significantly changed since prior exam. Electronically Signed   By: Randa Ngo M.D.   On: 12/28/2021 21:14   DG Chest Port 1 View  Result Date: 12/24/2021 CLINICAL DATA:  Shortness of breath. EXAM: PORTABLE CHEST 1 VIEW COMPARISON:  None Available. FINDINGS: Cardiac silhouette and mediastinal contours are within normal limits. There is a small right pleural effusion with associated basilar opacity. The left lung is clear. No pneumothorax. No acute  skeletal abnormality. Surgical clips overlie the gastroesophageal junction. IMPRESSION: Small right pleural effusion with associated right basilar likely subsegmental atelectasis. Electronically Signed   By: Yvonne Kendall M.D.   On: 12/24/2021 18:52   VAS [place] LOWER EXTREMITY VENOUS (DVT)  Result Date: 12/27/2021  Lower Venous DVT Study Patient Name:  KAIMEN PEINE  Date of Exam:   12/26/2021 Medical Rec #: [id number]         Accession #:    [id number] Date of Birth: [birth year]         Patient Gender: M Patient Age:   53 years Exam Location:  [facility] Procedure:      VAS [place] LOWER EXTREMITY VENOUS (DVT) Referring Phys: Nyoka Lint DOUTOVA --------------------------------------------------------------------------------  Indications: Swelling.  Comparison Study: No prior studies. Performing Technologist: Darlin Coco RDMS, RVT  Examination Guidelines: A complete evaluation includes B-mode imaging, spectral Doppler, color Doppler, and power Doppler as needed of all accessible portions of each vessel. Bilateral testing is considered an integral part of a complete examination. Limited examinations for reoccurring indications may be performed as noted. The reflux portion of the exam is performed with the patient in reverse Trendelenburg.  +---------+---------------+---------+-----------+----------+--------------+ RIGHT    CompressibilityPhasicitySpontaneityPropertiesThrombus Aging +---------+---------------+---------+-----------+----------+--------------+ CFV      Full           Yes      Yes                                 +---------+---------------+---------+-----------+----------+--------------+ SFJ      Full                                                        +---------+---------------+---------+-----------+----------+--------------+ FV Prox  Full                                                        +---------+---------------+---------+-----------+----------+--------------+  FV Mid   Full                                                        +---------+---------------+---------+-----------+----------+--------------+ FV DistalFull                                                        +---------+---------------+---------+-----------+----------+--------------+  PFV      Full                                                        +---------+---------------+---------+-----------+----------+--------------+ POP      Full           Yes      Yes                                 +---------+---------------+---------+-----------+----------+--------------+ PTV      Full                                                        +---------+---------------+---------+-----------+----------+--------------+ PERO     Full                                                        +---------+---------------+---------+-----------+----------+--------------+   +---------+---------------+---------+-----------+----------+--------------+ LEFT     CompressibilityPhasicitySpontaneityPropertiesThrombus Aging +---------+---------------+---------+-----------+----------+--------------+ CFV      Full           Yes      Yes                                 +---------+---------------+---------+-----------+----------+--------------+ SFJ      Full                                                        +---------+---------------+---------+-----------+----------+--------------+ FV Prox  Full                                                        +---------+---------------+---------+-----------+----------+--------------+ FV Mid   Full                                                        +---------+---------------+---------+-----------+----------+--------------+ FV DistalFull                                                        +---------+---------------+---------+-----------+----------+--------------+ PFV      Full                                                         +---------+---------------+---------+-----------+----------+--------------+  POP      Full           Yes      Yes                                 +---------+---------------+---------+-----------+----------+--------------+ PTV      Full                                                        +---------+---------------+---------+-----------+----------+--------------+ PERO     Full                                                        +---------+---------------+---------+-----------+----------+--------------+     Summary: RIGHT: - There is no evidence of deep vein thrombosis in the lower extremity.  - No cystic structure found in the popliteal fossa.  LEFT: - There is no evidence of deep vein thrombosis in the lower extremity.  - No cystic structure found in the popliteal fossa.  *See table(s) above for measurements and observations. Electronically signed by Servando Snare MD on 12/27/2021 at 4:25:48 PM.    Final      TODAY-DAY OF DISCHARGE:  Subjective:   Uvaldo Rising today has no headache,no chest abdominal pain,no new weakness tingling or numbness, feels much better    Objective:   Blood pressure 135/77, pulse 68, temperature 97.6 F (36.4 C), temperature source Oral, resp. rate 17, height 5\' 7"  (1.702 m), weight 52.3 kg, SpO2 99 %.  Intake/Output Summary (Last 24 hours) at 01/04/2022 0952 Last data filed at 01/04/2022 0318 Gross per 24 hour  Intake 240 ml  Output 200 ml  Net 40 ml   Filed Weights   12/30/21 2012 01/01/22 0851 01/01/22 1308  Weight: 53.6 kg 54.3 kg 52.3 kg    Exam:  Awake Alert, No new F.N deficits,  Circle.AT,PERRAL Supple Neck, No JVD,   Symmetrical Chest wall movement, Good air movement bilaterally, CTAB RRR,No Gallops, Rubs or new Murmurs,  +ve B.Sounds, Abd Soft, No tenderness,   No Cyanosis, Clubbing or edema     PERTINENT RADIOLOGIC STUDIES: No results found.   PERTINENT LAB RESULTS:  Recent Labs  Lab  12/28/21 1906 12/28/21 2008 12/29/21 0149 01/01/22 0046 01/04/22 0021  WBC 7.0 8.5 7.8 6.4 8.9  HGB 11.5* 10.2* 8.7* 8.3* 8.3*  HCT 32.4* 31.0* 25.9* 25.1* 24.7*  PLT 114* 137* 104* 130* 177  MCV 85.0 90.1 88.7 90.3 91.8  MCH 30.2 29.7 29.8 29.9 30.9  MCHC 35.5 32.9 33.6 33.1 33.6  RDW 15.6* 15.9* 15.4 14.7 14.4  LYMPHSABS  --  0.5*  --   --   --   MONOABS  --  0.4  --   --   --   EOSABS  --  0.3  --   --   --   BASOSABS  --  0.1  --   --   --     Recent Labs  Lab 12/28/21 2222 12/29/21 0149 01/04/22 0021  NA  --  135 133*  K  --  3.9 4.2  CL  --  103  99  CO2  --  25 26  GLUCOSE  --  101* 88  BUN  --  27* 37*  CREATININE  --  5.41* 7.77*  CALCIUM  --  8.1* 8.6*  ALBUMIN  --  1.9*  --   PHOS  --  4.2  --   LATICACIDVEN 1.0  --   --      Total Time spent coordinating discharge including counseling, education and face to face time equals greater than 30 minutes.  Signed: Lala Lund 01/04/2022 9:52 AM

## 2022-01-01 NOTE — Progress Notes (Addendum)
Advised by CSW this am that pt's snf is able to transport pt to [facility] on MWF with 6:30 arrival time. Contacted Beverly with [facility] ([facility] this morning to make her aware snf agreeable to above schedule. Beverly to f/u with clinic regarding status of pt's referral. Will await response.   Melven Sartorius Renal Navigator [phone]  Addendum at 3:48 pm: Spoke to [employer] [facility]. Pt's case needs to be accepted by a nephrologist and then insurance approval will be needed. Insurance auth cannot be started until a nephrologist has accepted pt. Update provided to CSW. [facility] to provide update on Monday.

## 2022-01-01 NOTE — Progress Notes (Signed)
OT Cancellation Note  Patient Details Name: Cody Macdonald MRN: [id number] DOB: March 28, 1961   Cancelled Treatment:    Reason Eval/Macdonald Not Completed: Patient declined, no reason specified. Will continue efforts.  Malka So 01/01/2022, 3:44 PM Nestor Lewandowsky, OTR/L Acute Rehabilitation Services Pager: [phone] Office: [phone]

## 2022-01-01 NOTE — Progress Notes (Signed)
Pt catheter reversed,

## 2022-01-01 NOTE — Progress Notes (Signed)
New Dialysis Start   Patient identified as new dialysis start. Kidney Education packet assembled and given. Discussed the following items with patient:    Current medications and possible changes once started:  Discussed that patient's medications may change over time.  Ex; hypertension medications and diabetes medication.  Nephrologists will adjust as needed.  Fluid restrictions reviewed:  32 oz daily goal:  All liquids count; soups, ice, jello   Phosphorus and potassium: Handout given showing high potassium and phosphorus foods.  Alternative food and drink options given.  Family support:  No family present in room  Outpatient Clinic Resources:  Discussed roles of Outpatient clinic  staff and advised to make a list of needs, if any, to talk with outpatient staff if needed  Care plan schedule: Informed patient and family member of Care Plans in outpatient setting and to participate in the care plan.  An invitation would be given from outpatient clinic.   Dialysis Access Options:  Reviewed access options with patients. Discussed in detail about care at home with new AVG & AVF. Reviewed checking bruit and thrill. If dialysis catheter present, educated that patient could not take showers.  Catheter dressing changes were to be done by outpatient clinic staff only  Home therapy options:  Patient is being discharged to a SNF          Patient verbalized understanding. Will continue to round on patient during admission.    Lilia Argue, RN

## 2022-01-01 NOTE — Progress Notes (Signed)
PT goal lowered to 2L per RN decision.

## 2022-01-01 NOTE — Progress Notes (Signed)
PROGRESS NOTE        PATIENT DETAILS Name: Cody Macdonald Age: 61 y.o. Sex: male Date of Birth: 07-26-1961 Admit Date: 12/24/2021 Admitting Physician Toy Baker, MD PCP:Pcp, No  Brief Summary: Patient is a 61 y.o.  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  See below for further details.  Significant events: 5/25>> referred to ED from [facility]-progressive renal failure-concern for ESRD, severe anemia.  Admit to [facility]. 5/29>> fever Tmax 100.8-blood culture sent.  Significant studies: 5/25>> CXR: Small right pleural effusion-right basilar subsegmental atelectasis 5/26>> CT abdomen: Small to moderate free fluid in the pelvis, bilateral pleural effusions.  No evidence of SBO. 5/27>> bilateral lower extremity Doppler: No DVT. 5/29>> CXR: No obvious PNA  Significant microbiology data: 5/29>> blood culture: No growth  Procedures: 5/26>> tunneled HD catheter  Consults: Nephrology, IR  Subjective: Lying comfortably in bed-no major issues overnight.  Awaiting SNF placement.  Objective: Vitals: Blood pressure (!) 162/89, pulse 79, temperature 98.2 F (36.8 C), resp. rate 16, height 5\' 7"  (1.702 m), weight 54.3 kg, SpO2 100 %.   Exam: Gen Exam:Alert awake-not in any distress HEENT:atraumatic, normocephalic Chest: B/L clear to auscultation anteriorly CVS:S1S2 regular Abdomen:soft non tender, non distended Extremities:no edema Neurology: Non focal Skin: no rash    Pertinent Labs/Radiology:    Latest Ref Rng & Units 01/01/2022   12:46 AM 12/29/2021    1:49 AM 12/28/2021    8:08 PM  CBC  WBC 4.0 - 10.5 K/uL 6.4   7.8   8.5    Hemoglobin 13.0 - 17.0 g/dL 8.3   8.7   10.2    Hematocrit 39.0 - 52.0 % 25.1   25.9   31.0    Platelets 150 - 400 K/uL 130   104   137      Lab Results  Component Value Date   NA  135 12/29/2021   K 3.9 12/29/2021   CL 103 12/29/2021   CO2 25 12/29/2021      Assessment/Plan: AKI on CKD stage IV-likely progression to ESRD: Started on HD this admit-tolerating well so far-nephrology following-arrangements being made for for outpatient HD.    Normocytic anemia: Likely due to CKD-no evidence of blood loss (brown stools several days back).  Hemoglobin stable-has required a total of 3 units of PRBC so far.  Aranesp/IV iron deferred to nephrology.   Fever on 5/29: No fever since then-no foci of infection apparent-blood cultures negative so far.  Being monitored off antimicrobial therapy.    Left upper abdominal pain: Resolved-CT abdomen on 5/26 did not show anything acute.  Benign abdominal exam.  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   Per patient-he was discharged from [facility] to SNF-where he still resides.  He has not ambulated and is mostly wheelchair-bound.  HTN: BP remains on the higher side-hydralazine dosage has been adjusted today-continue labetalol/Norvasc.    HLD: On statin.  Right> left lower extremity edema: Edema has resolved-Doppler studies negative for DVT.  History of alcohol use: Claims no further alcohol use since hospitalization in April for CVA.  Resides at [facility].  Tobacco abuse: Counseled-on transdermal nicotine  Nutrition Status: Nutrition Problem: Moderate Malnutrition Etiology: chronic illness (CKD, stroke) Signs/Symptoms: mild fat depletion,  moderate muscle depletion Interventions: Ensure Enlive (each supplement provides 350kcal and 20 grams of protein), MVI, Liberalize Diet, Refer to RD note for recommendations   BMI Estimated body mass index is 18.75 kg/m as calculated from the following:   Height as of this encounter: 5\' 7"  (1.702 m).   Weight as of this encounter: 54.3 kg.   Code status:   Code Status: Full Code   DVT Prophylaxis: SQ  heparin  Family Communication: None at bedside   Disposition Plan: Status is: Inpatient Remains inpatient appropriate because: Awaiting SNF placement-medically stable for discharge when bed available.   Planned Discharge Destination:Skilled nursing facility   Diet: Diet Order             Diet - low sodium heart healthy           Diet regular Room service appropriate? Yes; Fluid consistency: Thin; Fluid restriction: 1200 mL Fluid  Diet effective now                     Antimicrobial agents: Anti-infectives (From admission, onward)    Start     Dose/Rate Route Frequency Ordered Stop   12/25/21 1015  ceFAZolin (ANCEF) IVPB 2g/100 mL premix        2 g 200 mL/hr over 30 Minutes Intravenous To Radiology 12/25/21 0943 12/25/21 1116        MEDICATIONS: Scheduled Meds:  amLODipine  10 mg Oral Daily   aspirin EC  81 mg Oral Daily   atorvastatin  80 mg Oral QHS   Chlorhexidine Gluconate Cloth  6 each Topical Q0600   clopidogrel  75 mg Oral Daily   darbepoetin (ARANESP) injection - DIALYSIS  100 mcg Intravenous Q Fri-HD   feeding supplement  237 mL Oral BID BM   heparin injection (subcutaneous)  5,000 Units Subcutaneous Q8H   heparin sodium (porcine)       hydrALAZINE  50 mg Oral Q8H   labetalol  300 mg Oral BID   melatonin  3 mg Oral QHS   multivitamin  1 tablet Oral QHS   pantoprazole  40 mg Oral BID   pneumococcal 20-valent conjugate vaccine  0.5 mL Intramuscular Tomorrow-1000   sevelamer carbonate  800 mg Oral TID WC   Continuous Infusions:   PRN Meds:.acetaminophen, albuterol, hydrALAZINE, HYDROmorphone (DILAUDID) injection, ondansetron (ZOFRAN) IV, polyethylene glycol   I have personally reviewed following labs and imaging studies  LABORATORY DATA: CBC: Recent Labs  Lab 12/28/21 0059 12/28/21 1906 12/28/21 2008 12/29/21 0149 01/01/22 0046  WBC 8.0 7.0 8.5 7.8 6.4  NEUTROABS  --   --  7.2  --   --   HGB 7.0* 11.5* 10.2* 8.7* 8.3*  HCT 21.5*  32.4* 31.0* 25.9* 25.1*  MCV 88.5 85.0 90.1 88.7 90.3  PLT 98* 114* 137* 104* 130*     Basic Metabolic Panel: Recent Labs  Lab 12/26/21 0450 12/28/21 0059 12/29/21 0149  NA 138 136 135  K 4.3 4.2 3.9  CL 107 106 103  CO2 18* 24 25  GLUCOSE 81 115* 101*  BUN 67* 62* 27*  CREATININE 10.24* 8.59* 5.41*  CALCIUM 8.3* 8.0* 8.1*  PHOS 8.1* 5.7* 4.2     GFR: Estimated Creatinine Clearance: 11.2 mL/min (A) (by C-G formula based on SCr of 5.41 mg/dL (H)).  Liver Function Tests: Recent Labs  Lab 12/26/21 0450 12/28/21 0059 12/29/21 0149  ALBUMIN 2.0* 1.9* 1.9*    No results for input(s): LIPASE, AMYLASE in the last 168 hours.  No results for input(s): AMMONIA in the last 168 hours.  Coagulation Profile: No results for input(s): INR, PROTIME in the last 168 hours.   Cardiac Enzymes: No results for input(s): CKTOTAL, CKMB, CKMBINDEX, TROPONINI in the last 168 hours.   BNP (last 3 results) No results for input(s): PROBNP in the last 8760 hours.  Lipid Profile: No results for input(s): CHOL, HDL, LDLCALC, TRIG, CHOLHDL, LDLDIRECT in the last 72 hours.   Thyroid Function Tests: No results for input(s): TSH, T4TOTAL, FREET4, T3FREE, THYROIDAB in the last 72 hours.   Anemia Panel: No results for input(s): VITAMINB12, FOLATE, FERRITIN, TIBC, IRON, RETICCTPCT in the last 72 hours.   Urine analysis: No results found for: COLORURINE, APPEARANCEUR, LABSPEC, PHURINE, GLUCOSEU, HGBUR, BILIRUBINUR, KETONESUR, PROTEINUR, UROBILINOGEN, NITRITE, LEUKOCYTESUR  Sepsis Labs: Lactic Acid, Venous    Component Value Date/Time   LATICACIDVEN 1.0 12/28/2021 2222    MICROBIOLOGY: Recent Results (from the past 240 hour(s))  Culture, blood (Routine X 2) w Reflex to ID Panel     Status: None (Preliminary result)   Collection Time: 12/28/21  8:12 PM   Specimen: BLOOD RIGHT HAND  Result Value Ref Range Status   Specimen Description BLOOD RIGHT HAND  Final   Special Requests    Final    BOTTLES DRAWN AEROBIC AND ANAEROBIC Blood Culture adequate volume   Culture   Final    NO GROWTH 4 DAYS Performed at [facility], [place] [address], [place], [place] [address]    Report Status PENDING  Incomplete  Culture, blood (Routine X 2) w Reflex to ID Panel     Status: None (Preliminary result)   Collection Time: 12/28/21  8:12 PM   Specimen: BLOOD  Result Value Ref Range Status   Specimen Description BLOOD RIGHT ANTECUBITAL  Final   Special Requests   Final    BOTTLES DRAWN AEROBIC AND ANAEROBIC Blood Culture adequate volume   Culture   Final    NO GROWTH 4 DAYS Performed at [facility], [place] [address] [place], [place] [address]    Report Status PENDING  Incomplete    RADIOLOGY STUDIES/RESULTS: No results found.   LOS: 8 days   Oren Binet, MD  [facility]    To contact the attending provider between 7A-7P or the covering provider during after hours 7P-7A, please log into the web site [email or web address] and access using universal [place] password for that web site. If you do not have the password, please call the hospital operator.  01/01/2022, 11:02 AM

## 2022-01-01 NOTE — Progress Notes (Signed)
Nutrition Follow-up  DOCUMENTATION CODES:   Non-severe (moderate) malnutrition in context of chronic illness  INTERVENTION:   Continue Renal Multivitamin w/ minerals daily Continue Ensure Enlive po BID, each supplement provides 350 kcal and 20 grams of protein. Diet education  NUTRITION DIAGNOSIS:   Moderate Malnutrition related to chronic illness (CKD, stroke) as evidenced by mild fat depletion, moderate muscle depletion. - Ongoing  GOAL:   Patient will meet greater than or equal to 90% of their needs - Ongoing  MONITOR:   PO intake, Supplement acceptance, Labs, Weight trends, I & O's  REASON FOR ASSESSMENT:   Consult Diet education  ASSESSMENT:   Pt admitted from SNF with abnormal labs found to have acute renal failure superimposed on CKD stage 3b. PMH significant for HTN, stroke (April 2023), and CKD stage IV.  RD received a consult for HD diet education. Provided pt with renal diet education handouts. Reviewed increased protein needs and eating 2-3 portions of protein per day. Reviewed to limit sodium, phosphorus, and potassium. Reviewed list of foods that are high in potassium and phosphorus. Discussed reducing fluid intake to prevent retention.  RD discussed with pt that each dialysis center has an RD on staff that will be available.   Per EMR, pt ate 60% of breakfast on 5/29 and 50% of breakfast on 5/31.     Pt reports that he has been tolerating HD and has not experienced any nausea or vomiting with it.   Medications reviewed and include: Rena-Vit, Protonix, Renvela Labs reviewed.  Diet Order:   Diet Order             Diet - low sodium heart healthy           Diet regular Room service appropriate? Yes; Fluid consistency: Thin; Fluid restriction: 1200 mL Fluid  Diet effective now                   EDUCATION NEEDS:   Education needs have been addressed  Skin:  Skin Assessment: Reviewed RN Assessment  Last BM:  6/1  Height:   Ht Readings from  Last 1 Encounters:  12/24/21 5\' 7"  (1.702 m)    Weight:   Wt Readings from Last 1 Encounters:  01/01/22 52.3 kg   BMI:  Body mass index is 18.06 kg/m.  Estimated Nutritional Needs:   Kcal:  1800-2000  Protein:  90-105g  Fluid:  1L + UOP    Hermina Barters RD, LDN Clinical Dietitian See [facility] for contact information.

## 2022-01-02 DIAGNOSIS — N179 Acute kidney failure, unspecified: Secondary | ICD-10-CM | POA: Diagnosis not present

## 2022-01-02 DIAGNOSIS — N1832 Chronic kidney disease, stage 3b: Secondary | ICD-10-CM | POA: Diagnosis not present

## 2022-01-02 LAB — CULTURE, BLOOD (ROUTINE X 2)
Culture: NO GROWTH
Culture: NO GROWTH
Special Requests: ADEQUATE
Special Requests: ADEQUATE

## 2022-01-02 NOTE — Progress Notes (Signed)
Patient ID: Cody Macdonald, male   DOB: 1961-06-14, 61 y.o.   MRN: [id number] [facility] Progress Note   Assessment/ Plan:   1.  Acute kidney injury on chronic kidney disease- Unclear if this is evolution to ESRD: Suspected to have had progression of chronic kidney disease and with development of uremic symptoms and started on hemodialysis 5/26 with second treatment on 5/27.  Unclear if this is end-stage renal disease or if he will have meaningful recovery of renal function.  The process is underway for OP HD unit placement and transport.  Will maintain HD on MWF schedule 2.  Symptomatic anemia: Unclear if he had any overt losses. Iron replete on labs, ESA started 5/29.Hgb from 10.7 to 8.7 on 5/30, s/p 1u prbc, 8.3 6/2. would recommend checking a FOBT--will defer to primary service 3.  History of CVA: on DAPT, per primary 4.  Hypertension: Resume oral antihypertensive agents and monitor with ultrafiltration/hemodialysis. 5.  Chronic kidney disease-metabolic bone disease: Continue to monitor calcium and phosphorus levels, corrected cal WNL. On renvela 6.  Nutrition: Likely compromised by advancing chronic kidney disease and history of alcohol use.  Monitor on ONS. Push protein  Subjective:   Patient seen and examined bedside. No acute events. Tolerated HD yesterday. No complaints at this time   Objective:   BP (!) 150/78 (BP Location: Right Arm)   Pulse 82   Temp 97.9 F (36.6 C) (Oral)   Resp 15   Ht 5\' 7"  (1.702 m)   Wt 52.3 kg   SpO2 98%   BMI 18.06 kg/m   Intake/Output Summary (Last 24 hours) at 01/02/2022 1106 Last data filed at 01/01/2022 2018 Gross per 24 hour  Intake 240 ml  Output 102 ml  Net 138 ml   Weight change:   Physical Exam: Gen: nad, laying flat in bed, flat affect CVS: RRR, S1S2 Resp: CTA bl, unlabored HKV:[id number], NT Ext: no sig edema b/l LEs Neuro: awake, alert Dialysis access: RIJ TDC  Imaging: No results found.  Labs: BMET Recent Labs   Lab 12/28/21 0059 12/29/21 0149  NA 136 135  K 4.2 3.9  CL 106 103  CO2 24 25  GLUCOSE 115* 101*  BUN 62* 27*  CREATININE 8.59* 5.41*  CALCIUM 8.0* 8.1*  PHOS 5.7* 4.2   CBC Recent Labs  Lab 12/28/21 1906 12/28/21 2008 12/29/21 0149 01/01/22 0046  WBC 7.0 8.5 7.8 6.4  NEUTROABS  --  7.2  --   --   HGB 11.5* 10.2* 8.7* 8.3*  HCT 32.4* 31.0* 25.9* 25.1*  MCV 85.0 90.1 88.7 90.3  PLT 114* 137* 104* 130*    Medications:     amLODipine  10 mg Oral Daily   aspirin EC  81 mg Oral Daily   atorvastatin  80 mg Oral QHS   Chlorhexidine Gluconate Cloth  6 each Topical Q0600   clopidogrel  75 mg Oral Daily   darbepoetin (ARANESP) injection - DIALYSIS  100 mcg Intravenous Q Fri-HD   feeding supplement  237 mL Oral BID BM   heparin injection (subcutaneous)  5,000 Units Subcutaneous Q8H   hydrALAZINE  50 mg Oral Q8H   labetalol  300 mg Oral BID   melatonin  3 mg Oral QHS   multivitamin  1 tablet Oral QHS   pantoprazole  40 mg Oral BID   pneumococcal 20-valent conjugate vaccine  0.5 mL Intramuscular Tomorrow-1000   sevelamer carbonate  800 mg Oral TID WC   Gean Quint, MD  [facility] 01/02/2022, 11:06 AM

## 2022-01-02 NOTE — Plan of Care (Signed)
  Problem: Clinical Measurements: Goal: Ability to maintain clinical measurements within normal limits will improve Outcome: Progressing Goal: Will remain free from infection Outcome: Progressing Goal: Diagnostic test results will improve Outcome: Progressing Goal: Respiratory complications will improve Outcome: Progressing   Problem: Coping: Goal: Level of anxiety will decrease Outcome: Progressing   Problem: Elimination: Goal: Will not experience complications related to bowel motility Outcome: Progressing Goal: Will not experience complications related to urinary retention Outcome: Progressing

## 2022-01-02 NOTE — Progress Notes (Signed)
PROGRESS NOTE        PATIENT DETAILS Name: Cody Macdonald Age: 61 y.o. Sex: male Date of Birth: 05-Jun-1961 Admit Date: 12/24/2021 Admitting Physician Toy Baker, MD PCP:Pcp, No  Brief Summary: Patient is a 60 y.o.  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  See below for further details.  Significant events: 5/25>> referred to ED from [facility]-progressive renal failure-concern for ESRD, severe anemia.  Admit to [facility]. 5/29>> fever Tmax 100.8-blood culture sent.  Significant studies: 5/25>> CXR: Small right pleural effusion-right basilar subsegmental atelectasis 5/26>> CT abdomen: Small to moderate free fluid in the pelvis, bilateral pleural effusions.  No evidence of SBO. 5/27>> bilateral lower extremity Doppler: No DVT. 5/29>> CXR: No obvious PNA  Significant microbiology data: 5/29>> blood culture: No growth  Procedures: 5/26>> tunneled HD catheter  Consults: Nephrology, IR  Subjective: Denies any abdominal pain.  Lying comfortably in bed no major issues overnight.  Awaiting SNF placement.  Objective: Vitals: Blood pressure (!) 150/78, pulse 82, temperature 97.9 F (36.6 C), temperature source Oral, resp. rate 15, height 5\' 7"  (1.702 m), weight 52.3 kg, SpO2 98 %.   Exam: Gen Exam:Alert awake-not in any distress HEENT:atraumatic, normocephalic Chest: B/L clear to auscultation anteriorly CVS:S1S2 regular Abdomen:soft non tender, non distended Extremities:no edema Neurology: Left-sided hemiparesis (arm> leg) Skin: no rash   Pertinent Labs/Radiology:    Latest Ref Rng & Units 01/01/2022   12:46 AM 12/29/2021    1:49 AM 12/28/2021    8:08 PM  CBC  WBC 4.0 - 10.5 K/uL 6.4   7.8   8.5    Hemoglobin 13.0 - 17.0 g/dL 8.3   8.7   10.2    Hematocrit 39.0 - 52.0 % 25.1   25.9   31.0    Platelets 150  - 400 K/uL 130   104   137      Lab Results  Component Value Date   NA 135 12/29/2021   K 3.9 12/29/2021   CL 103 12/29/2021   CO2 25 12/29/2021      Assessment/Plan: AKI on CKD stage IV-likely progression to ESRD: Started on HD this admit-tolerating well so far-nephrology following-arrangements being made for for outpatient HD.    Normocytic anemia: Likely due to CKD-no evidence of blood loss (brown stools several days back).  Hemoglobin stable-has required a total of 3 units of PRBC so far.  Aranesp/IV iron deferred to nephrology.   Fever on 5/29: No fever since then-no foci of infection apparent-blood cultures negative so far.  Being monitored off antimicrobial therapy.    Left upper abdominal pain: Occurs intermittently at times-resolved for the past several days-CT abdomen without any acute abnormalities.  Benign abdominal exam.  Continue as needed narcotics.  Unclear etiology for pain. 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  Per patient-he was discharged from [facility] to SNF-where he still resides.  He has not ambulated and is mostly wheelchair-bound.  HTN: BP waiting but better controlled over the past several days-continue hydralazine/labetalol and Norvasc.   HLD: On statin.  Right> left lower extremity edema: Edema has resolved-Doppler studies negative for DVT.  History of alcohol use: Claims no further alcohol use since hospitalization in April for CVA.  Resides at  SNF.  Tobacco abuse: Counseled-on transdermal nicotine  Nutrition Status: Nutrition Problem: Moderate Malnutrition Etiology: chronic illness (CKD, stroke) Signs/Symptoms: mild fat depletion, moderate muscle depletion Interventions: Ensure Enlive (each supplement provides 350kcal and 20 grams of protein), MVI, Education   BMI Estimated body mass index is 18.06 kg/m as calculated from the following:   Height as of this encounter: 5\' 7"  (1.702  m).   Weight as of this encounter: 52.3 kg.   Code status:   Code Status: Full Code   DVT Prophylaxis: SQ heparin  Family Communication: None at bedside   Disposition Plan: Status is: Inpatient Remains inpatient appropriate because: Awaiting SNF placement-medically stable for discharge when bed available.  Per social work-has a SNF bed-but barrier to discharge is transportation to HD facility from SNF.   Planned Discharge Destination:Skilled nursing facility   Diet: Diet Order             Diet - low sodium heart healthy           Diet regular Room service appropriate? Yes; Fluid consistency: Thin; Fluid restriction: 1200 mL Fluid  Diet effective now                     Antimicrobial agents: Anti-infectives (From admission, onward)    Start     Dose/Rate Route Frequency Ordered Stop   12/25/21 1015  ceFAZolin (ANCEF) IVPB 2g/100 mL premix        2 g 200 mL/hr over 30 Minutes Intravenous To Radiology 12/25/21 0943 12/25/21 1116        MEDICATIONS: Scheduled Meds:  amLODipine  10 mg Oral Daily   aspirin EC  81 mg Oral Daily   atorvastatin  80 mg Oral QHS   Chlorhexidine Gluconate Cloth  6 each Topical Q0600   clopidogrel  75 mg Oral Daily   darbepoetin (ARANESP) injection - DIALYSIS  100 mcg Intravenous Q Fri-HD   feeding supplement  237 mL Oral BID BM   heparin injection (subcutaneous)  5,000 Units Subcutaneous Q8H   hydrALAZINE  50 mg Oral Q8H   labetalol  300 mg Oral BID   melatonin  3 mg Oral QHS   multivitamin  1 tablet Oral QHS   pantoprazole  40 mg Oral BID   pneumococcal 20-valent conjugate vaccine  0.5 mL Intramuscular Tomorrow-1000   sevelamer carbonate  800 mg Oral TID WC   Continuous Infusions:   PRN Meds:.acetaminophen, albuterol, hydrALAZINE, HYDROmorphone (DILAUDID) injection, ondansetron (ZOFRAN) IV, polyethylene glycol   I have personally reviewed following labs and imaging studies  LABORATORY DATA: CBC: Recent Labs  Lab  12/28/21 0059 12/28/21 1906 12/28/21 2008 12/29/21 0149 01/01/22 0046  WBC 8.0 7.0 8.5 7.8 6.4  NEUTROABS  --   --  7.2  --   --   HGB 7.0* 11.5* 10.2* 8.7* 8.3*  HCT 21.5* 32.4* 31.0* 25.9* 25.1*  MCV 88.5 85.0 90.1 88.7 90.3  PLT 98* 114* 137* 104* 130*     Basic Metabolic Panel: Recent Labs  Lab 12/28/21 0059 12/29/21 0149  NA 136 135  K 4.2 3.9  CL 106 103  CO2 24 25  GLUCOSE 115* 101*  BUN 62* 27*  CREATININE 8.59* 5.41*  CALCIUM 8.0* 8.1*  PHOS 5.7* 4.2     GFR: Estimated Creatinine Clearance: 10.7 mL/min (A) (by C-G formula based on SCr of 5.41 mg/dL (H)).  Liver Function Tests: Recent Labs  Lab 12/28/21 0059 12/29/21 0149  ALBUMIN 1.9* 1.9*    No  results for input(s): LIPASE, AMYLASE in the last 168 hours. No results for input(s): AMMONIA in the last 168 hours.  Coagulation Profile: No results for input(s): INR, PROTIME in the last 168 hours.   Cardiac Enzymes: No results for input(s): CKTOTAL, CKMB, CKMBINDEX, TROPONINI in the last 168 hours.   BNP (last 3 results) No results for input(s): PROBNP in the last 8760 hours.  Lipid Profile: No results for input(s): CHOL, HDL, LDLCALC, TRIG, CHOLHDL, LDLDIRECT in the last 72 hours.   Thyroid Function Tests: No results for input(s): TSH, T4TOTAL, FREET4, T3FREE, THYROIDAB in the last 72 hours.   Anemia Panel: No results for input(s): VITAMINB12, FOLATE, FERRITIN, TIBC, IRON, RETICCTPCT in the last 72 hours.   Urine analysis: No results found for: COLORURINE, APPEARANCEUR, LABSPEC, PHURINE, GLUCOSEU, HGBUR, BILIRUBINUR, KETONESUR, PROTEINUR, UROBILINOGEN, NITRITE, LEUKOCYTESUR  Sepsis Labs: Lactic Acid, Venous    Component Value Date/Time   LATICACIDVEN 1.0 12/28/2021 2222    MICROBIOLOGY: Recent Results (from the past 240 hour(s))  Culture, blood (Routine X 2) w Reflex to ID Panel     Status: None   Collection Time: 12/28/21  8:12 PM   Specimen: BLOOD RIGHT HAND  Result Value Ref  Range Status   Specimen Description BLOOD RIGHT HAND  Final   Special Requests   Final    BOTTLES DRAWN AEROBIC AND ANAEROBIC Blood Culture adequate volume   Culture   Final    NO GROWTH 5 DAYS Performed at [facility], [place] [address], [place], [place] [address]    Report Status 01/02/2022 FINAL  Final  Culture, blood (Routine X 2) w Reflex to ID Panel     Status: None   Collection Time: 12/28/21  8:12 PM   Specimen: BLOOD  Result Value Ref Range Status   Specimen Description BLOOD RIGHT ANTECUBITAL  Final   Special Requests   Final    BOTTLES DRAWN AEROBIC AND ANAEROBIC Blood Culture adequate volume   Culture   Final    NO GROWTH 5 DAYS Performed at [facility], [place] [address], [place], [place] [address]    Report Status 01/02/2022 FINAL  Final    RADIOLOGY STUDIES/RESULTS: No results found.   LOS: 9 days   Oren Binet, MD  [facility]    To contact the attending provider between 7A-7P or the covering provider during after hours 7P-7A, please log into the web site [email or web address] and access using universal [place] password for that web site. If you do not have the password, please call the hospital operator.  01/02/2022, 10:28 AM

## 2022-01-03 DIAGNOSIS — R140 Abdominal distension (gaseous): Secondary | ICD-10-CM | POA: Diagnosis not present

## 2022-01-03 DIAGNOSIS — Z8673 Personal history of transient ischemic attack (TIA), and cerebral infarction without residual deficits: Secondary | ICD-10-CM | POA: Diagnosis not present

## 2022-01-03 DIAGNOSIS — D649 Anemia, unspecified: Secondary | ICD-10-CM | POA: Diagnosis not present

## 2022-01-03 NOTE — Progress Notes (Signed)
PROGRESS NOTE        PATIENT DETAILS Name: Cody Macdonald Age: 61 y.o. Sex: male Date of Birth: [birth year] Admit Date: 12/24/2021 Admitting Physician Toy Baker, MD PCP:Pcp, No  Brief Summary: Patient is a 61 y.o.  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  See below for further details.  Significant events: 5/25>> referred to ED from [facility]-progressive renal failure-concern for ESRD, severe anemia.  Admit to [facility]. 5/29>> fever Tmax 100.8-blood culture sent.  Significant studies: 5/25>> CXR: Small right pleural effusion-right basilar subsegmental atelectasis 5/26>> CT abdomen: Small to moderate free fluid in the pelvis, bilateral pleural effusions.  No evidence of SBO. 5/27>> bilateral lower extremity Doppler: No DVT. 5/29>> CXR: No obvious PNA  Significant microbiology data: 5/29>> blood culture: No growth  Procedures: 5/26>> right subclavian tunneled HD catheter  Consults: Nephrology, IR  Subjective:  Patient in bed, appears comfortable, denies any headache, no fever, no chest pain or pressure, no shortness of breath , no abdominal pain. No new focal weakness.   Objective: Vitals: Blood pressure (!) 145/82, pulse 77, temperature 97.7 F (36.5 C), temperature source Oral, resp. rate 15, height 5\' 7"  (1.702 m), weight 52.3 kg, SpO2 99 %.   Exam:  Awake Alert, No new F.N deficits, R.sided subclavian tunneled HD catheter in place [place].AT,PERRAL Supple Neck, No JVD,   Symmetrical Chest wall movement, Good air movement bilaterally, CTAB RRR,No Gallops, Rubs or new Murmurs,  +ve B.Sounds, Abd Soft, No tenderness,   No Cyanosis, Clubbing or edema   Assessment/Plan: AKI on CKD stage IV-likely progression to ESRD: Started on HD this admit-tolerating well so far-nephrology following-arrangements being  made for for outpatient HD.    Normocytic anemia: Likely due to CKD-no evidence of blood loss (brown stools several days back).  Hemoglobin stable-has required a total of 3 units of PRBC so far.  Aranesp/IV iron deferred to nephrology.   Fever on 5/29: No fever since then-no foci of infection apparent-blood cultures negative so far.  Being monitored off antimicrobial therapy.    Left upper abdominal pain: Occurs intermittently at times-resolved for the past several days-CT abdomen without any acute abnormalities.  Benign abdominal exam.  Continue as needed narcotics.  Unclear etiology for pain. 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  Per patient-he was discharged from [facility] to SNF-where he still resides.  He has not ambulated and is mostly wheelchair-bound.  HTN: BP waiting but better controlled over the past several days-continue hydralazine/labetalol and Norvasc.   HLD: On statin.  Right> left lower extremity edema: Edema has resolved-Doppler studies negative for DVT.  History of alcohol use: Claims no further alcohol use since hospitalization in April for CVA.  Resides at [facility].  Tobacco abuse: Counseled-on transdermal nicotine  Nutrition Status: Nutrition Problem: Moderate Malnutrition Etiology: chronic illness (CKD, stroke) Signs/Symptoms: mild fat depletion, moderate muscle depletion Interventions: Ensure Enlive (each supplement provides 350kcal and 20 grams of protein), MVI, Education   BMI Estimated body mass index is 18.06 kg/m as calculated from the following:   Height as of this encounter: 5\' 7"  (1.702 m).   Weight as of this encounter: 52.3 kg.   Code status:   Code Status: Full Code   DVT Prophylaxis: SQ  heparin  Family Communication: None at bedside   Disposition Plan: Status is: Inpatient Remains inpatient appropriate because: Awaiting SNF placement-medically stable for discharge when bed  available.  Per social work-has a SNF bed-but barrier to discharge is transportation to HD facility from SNF.   Planned Discharge Destination:Skilled nursing facility   Diet: Diet Order             Diet - low sodium heart healthy           Diet regular Room service appropriate? Yes; Fluid consistency: Thin; Fluid restriction: 1200 mL Fluid  Diet effective now                     Antimicrobial agents: Anti-infectives (From admission, onward)    Start     Dose/Rate Route Frequency Ordered Stop   12/25/21 1015  ceFAZolin (ANCEF) IVPB 2g/100 mL premix        2 g 200 mL/hr over 30 Minutes Intravenous To Radiology 12/25/21 0943 12/25/21 1116        MEDICATIONS: Scheduled Meds:  amLODipine  10 mg Oral Daily   aspirin EC  81 mg Oral Daily   atorvastatin  80 mg Oral QHS   Chlorhexidine Gluconate Cloth  6 each Topical Q0600   clopidogrel  75 mg Oral Daily   darbepoetin (ARANESP) injection - DIALYSIS  100 mcg Intravenous Q Fri-HD   feeding supplement  237 mL Oral BID BM   heparin injection (subcutaneous)  5,000 Units Subcutaneous Q8H   hydrALAZINE  50 mg Oral Q8H   labetalol  300 mg Oral BID   melatonin  3 mg Oral QHS   multivitamin  1 tablet Oral QHS   pantoprazole  40 mg Oral BID   pneumococcal 20-valent conjugate vaccine  0.5 mL Intramuscular Tomorrow-1000   sevelamer carbonate  800 mg Oral TID WC   Continuous Infusions:   PRN Meds:.acetaminophen, albuterol, hydrALAZINE, HYDROmorphone (DILAUDID) injection, ondansetron (ZOFRAN) IV, polyethylene glycol   I have personally reviewed following labs and imaging studies  LABORATORY DATA:  Recent Labs  Lab 12/28/21 0059 12/28/21 1906 12/28/21 2008 12/29/21 0149 01/01/22 0046  WBC 8.0 7.0 8.5 7.8 6.4  HGB 7.0* 11.5* 10.2* 8.7* 8.3*  HCT 21.5* 32.4* 31.0* 25.9* 25.1*  PLT 98* 114* 137* 104* 130*  MCV 88.5 85.0 90.1 88.7 90.3  MCH 28.8 30.2 29.7 29.8 29.9  MCHC 32.6 35.5 32.9 33.6 33.1  RDW 16.6* 15.6* 15.9*  15.4 14.7  LYMPHSABS  --   --  0.5*  --   --   MONOABS  --   --  0.4  --   --   EOSABS  --   --  0.3  --   --   BASOSABS  --   --  0.1  --   --     Recent Labs  Lab 12/28/21 0059 12/28/21 2222 12/29/21 0149  NA 136  --  135  K 4.2  --  3.9  CL 106  --  103  CO2 24  --  25  GLUCOSE 115*  --  101*  BUN 62*  --  27*  CREATININE 8.59*  --  5.41*  CALCIUM 8.0*  --  8.1*  ALBUMIN 1.9*  --  1.9*  PHOS 5.7*  --  4.2  LATICACIDVEN  --  1.0  --            MICROBIOLOGY: Recent Results (from the past 240 hour(s))  Culture, blood (Routine X 2)  w Reflex to ID Panel     Status: None   Collection Time: 12/28/21  8:12 PM   Specimen: BLOOD RIGHT HAND  Result Value Ref Range Status   Specimen Description BLOOD RIGHT HAND  Final   Special Requests   Final    BOTTLES DRAWN AEROBIC AND ANAEROBIC Blood Culture adequate volume   Culture   Final    NO GROWTH 5 DAYS Performed at [facility], [address] [place], [place] [address]    Report Status 01/02/2022 FINAL  Final  Culture, blood (Routine X 2) w Reflex to ID Panel     Status: None   Collection Time: 12/28/21  8:12 PM   Specimen: BLOOD  Result Value Ref Range Status   Specimen Description BLOOD RIGHT ANTECUBITAL  Final   Special Requests   Final    BOTTLES DRAWN AEROBIC AND ANAEROBIC Blood Culture adequate volume   Culture   Final    NO GROWTH 5 DAYS Performed at [facility], [place] [address], [place], [place] [address]    Report Status 01/02/2022 FINAL  Final    RADIOLOGY STUDIES/RESULTS: No results found.   LOS: 10 days   Signature  Lala Lund M.D on 01/03/2022 at 11:47 AM   -  To page go to [email or web address]

## 2022-01-03 NOTE — Progress Notes (Signed)
Patient ID: Cody Macdonald, male   DOB: 1960-12-16, 61 y.o.   MRN: [id number] [facility] Progress Note   Assessment/ Plan:   1.  Acute kidney injury on chronic kidney disease- Unclear if this is evolution to ESRD: Suspected to have had progression of chronic kidney disease and with development of uremic symptoms and started on hemodialysis 5/26 with second treatment on 5/27.  Unclear if this is end-stage renal disease or if he will have meaningful recovery of renal function.  The process is underway for OP HD unit placement and transport.  Will maintain HD on MWF schedule 2.  Symptomatic anemia: Unclear if he had any overt losses. Iron replete on labs, ESA started 5/29.Hgb from 10.7 to 8.7 on 5/30, s/p 1u prbc, 8.3 6/2. would recommend checking a FOBT--will defer to primary service 3.  History of CVA: on DAPT, per primary 4.  Hypertension: Resume oral antihypertensive agents and monitor with ultrafiltration/hemodialysis. 5.  Chronic kidney disease-metabolic bone disease: Continue to monitor calcium and phosphorus levels, corrected cal WNL. On renvela 6.  Nutrition: Likely compromised by advancing chronic kidney disease and history of alcohol use.  Monitor on ONS. Push protein  Subjective:   Patient seen and examined bedside. No acute events. No complaints at this time   Objective:   BP (!) 145/82 (BP Location: Left Arm)   Pulse 77   Temp 97.7 F (36.5 C) (Oral)   Resp 15   Ht 5\' 7"  (1.702 m)   Wt 52.3 kg   SpO2 99%   BMI 18.06 kg/m   Intake/Output Summary (Last 24 hours) at 01/03/2022 1041 Last data filed at 01/03/2022 1006 Gross per 24 hour  Intake 240 ml  Output --  Net 240 ml   Weight change:   Physical Exam: Gen: nad, sitting up at edge of bed eating breatkfast, flat affect CVS: RRR, S1S2 Resp: CTA bl, unlabored YIR:SWNI, NT Ext: no sig edema b/l LEs Neuro: awake, alert Dialysis access: RIJ TDC  Imaging: No results found.  Labs: BMET Recent Labs  Lab  12/28/21 0059 12/29/21 0149  NA 136 135  K 4.2 3.9  CL 106 103  CO2 24 25  GLUCOSE 115* 101*  BUN 62* 27*  CREATININE 8.59* 5.41*  CALCIUM 8.0* 8.1*  PHOS 5.7* 4.2   CBC Recent Labs  Lab 12/28/21 1906 12/28/21 2008 12/29/21 0149 01/01/22 0046  WBC 7.0 8.5 7.8 6.4  NEUTROABS  --  7.2  --   --   HGB 11.5* 10.2* 8.7* 8.3*  HCT 32.4* 31.0* 25.9* 25.1*  MCV 85.0 90.1 88.7 90.3  PLT 114* 137* 104* 130*    Medications:     amLODipine  10 mg Oral Daily   aspirin EC  81 mg Oral Daily   atorvastatin  80 mg Oral QHS   Chlorhexidine Gluconate Cloth  6 each Topical Q0600   clopidogrel  75 mg Oral Daily   darbepoetin (ARANESP) injection - DIALYSIS  100 mcg Intravenous Q Fri-HD   feeding supplement  237 mL Oral BID BM   heparin injection (subcutaneous)  5,000 Units Subcutaneous Q8H   hydrALAZINE  50 mg Oral Q8H   labetalol  300 mg Oral BID   melatonin  3 mg Oral QHS   multivitamin  1 tablet Oral QHS   pantoprazole  40 mg Oral BID   pneumococcal 20-valent conjugate vaccine  0.5 mL Intramuscular Tomorrow-1000   sevelamer carbonate  800 mg Oral TID WC   Gean Quint, MD  [facility] 01/03/2022, 10:41 AM

## 2022-01-04 LAB — BASIC METABOLIC PANEL
Anion gap: 8 (ref 5–15)
BUN: 37 mg/dL — ABNORMAL HIGH (ref 6–20)
CO2: 26 mmol/L (ref 22–32)
Calcium: 8.6 mg/dL — ABNORMAL LOW (ref 8.9–10.3)
Chloride: 99 mmol/L (ref 98–111)
Creatinine, Ser: 7.77 mg/dL — ABNORMAL HIGH (ref 0.61–1.24)
GFR, Estimated: 7 mL/min — ABNORMAL LOW (ref >60)
Glucose, Bld: 88 mg/dL (ref 70–99)
Potassium: 4.2 mmol/L (ref 3.5–5.1)
Sodium: 133 mmol/L — ABNORMAL LOW (ref 135–145)

## 2022-01-04 LAB — CBC
HCT: 24.7 % — ABNORMAL LOW (ref 39.0–52.0)
Hemoglobin: 8.3 g/dL — ABNORMAL LOW (ref 13.0–17.0)
MCH: 30.9 pg (ref 26.0–34.0)
MCHC: 33.6 g/dL (ref 30.0–36.0)
MCV: 91.8 fL (ref 80.0–100.0)
Platelets: 177 10*3/uL (ref 150–400)
RBC: 2.69 MIL/uL — ABNORMAL LOW (ref 4.22–5.81)
RDW: 14.4 % (ref 11.5–15.5)
WBC: 8.9 10*3/uL (ref 4.0–10.5)
nRBC: 0 % (ref 0.0–0.2)

## 2022-01-04 MED ORDER — HEPARIN SODIUM (PORCINE) 1000 UNIT/ML IJ SOLN: 4000.0000 [IU] | Freq: Once | INTRAMUSCULAR | Status: DC

## 2022-01-04 MED ORDER — HEPARIN SODIUM (PORCINE) 1000 UNIT/ML IJ SOLN
INTRAMUSCULAR | Status: AC
  Filled 2022-01-04: qty 4

## 2022-01-04 NOTE — TOC Transition Note (Signed)
Transition of Care [facility]) - CM/SW Discharge Note   Patient Details  Name: Cody Macdonald MRN: [id number] Date of Birth: Nov 28, 1960  [facility] of [facility]) CM/SW Contact:  Benard Halsted, LCSW Phone Number: 01/04/2022, 11:12 AM   Clinical Narrative:    CSW received Dialysis chair info for patient at [facility] and provided it to [facility] Patient will discharge there today after dialysis. CSW scheduled transport through his Medicaid plan; they will pick him up at 3:30pm.   Final next level of care: Skilled Nursing Facility Barriers to Discharge: Barriers Resolved   Patient Goals and CMS Choice Patient states their goals for this hospitalization and ongoing recovery are:: SNF CMS Medicare.gov Compare Post Acute Care list provided to:: Patient Choice offered to / list presented to : Patient  Discharge Placement   Existing PASRR number confirmed : 01/04/22          Patient chooses bed at: [facility] Patient to be transferred to facility by: [facility]   Patient and family notified of of transfer: 01/04/22  Discharge Plan and Services In-house Referral: Clinical Social Work   Post Acute Care Choice: [facility], Dialysis                               Social Determinants of Health (SDOH) Interventions     Readmission Risk Interventions     View : No data to display.

## 2022-01-04 NOTE — Progress Notes (Addendum)
Patient ID: Cody Macdonald, male   DOB: [birth year], 61 y.o.   MRN: [id number] S: Seen while on HD.  Tolerating it well. O:BP (!) 141/81 (BP Location: Left Arm)   Pulse 73   Temp 97.6 F (36.4 C) (Oral)   Resp 16   Ht _0  (1.702 m)   Wt 52.3 kg   SpO2 99%   BMI 18.06 kg/m   Intake/Output Summary (Last 24 hours) at 01/04/2022 0904 Last data filed at 01/04/2022 0318 Gross per 24 hour  Intake 240 ml  Output 200 ml  Net 40 ml   Intake/Output: I/O last 3 completed shifts: In: 240 [P.O.:240] Out: 200 [Urine:200]  Intake/Output this shift:  No intake/output data recorded. Weight change:  Gen: NAD CVS: RRR Resp: CTA Abd: +BS, soft, NT/ND Ext: no edema  Recent Labs  Lab 12/29/21 0149 01/04/22 0021  NA 135 133*  K 3.9 4.2  CL 103 99  CO2 25 26  GLUCOSE 101* 88  BUN 27* 37*  CREATININE 5.41* 7.77*  ALBUMIN 1.9*  --   CALCIUM 8.1* 8.6*  PHOS 4.2  --    Liver Function Tests: Recent Labs  Lab 12/29/21 0149  ALBUMIN 1.9*   No results for input(s): LIPASE, AMYLASE in the last 168 hours. No results for input(s): AMMONIA in the last 168 hours. CBC: Recent Labs  Lab 12/28/21 1906 12/28/21 2008 12/29/21 0149 01/01/22 0046 01/04/22 0021  WBC 7.0 8.5 7.8 6.4 8.9  NEUTROABS  --  7.2  --   --   --   HGB 11.5* 10.2* 8.7* 8.3* 8.3*  HCT 32.4* 31.0* 25.9* 25.1* 24.7*  MCV 85.0 90.1 88.7 90.3 91.8  PLT 114* 137* 104* 130* 177   Cardiac Enzymes: No results for input(s): CKTOTAL, CKMB, CKMBINDEX, TROPONINI in the last 168 hours. CBG: No results for input(s): GLUCAP in the last 168 hours.  Iron Studies: No results for input(s): IRON, TIBC, TRANSFERRIN, FERRITIN in the last 72 hours. Studies/Results: No results found.  amLODipine  10 mg Oral Daily   aspirin EC  81 mg Oral Daily   atorvastatin  80 mg Oral QHS   Chlorhexidine Gluconate Cloth  6 each Topical Q0600   clopidogrel  75 mg Oral Daily   darbepoetin (ARANESP) injection - DIALYSIS  100 mcg Intravenous Q Fri-HD    feeding supplement  237 mL Oral BID BM   heparin injection (subcutaneous)  5,000 Units Subcutaneous Q8H   hydrALAZINE  50 mg Oral Q8H   labetalol  300 mg Oral BID   melatonin  3 mg Oral QHS   multivitamin  1 tablet Oral QHS   pantoprazole  40 mg Oral BID   pneumococcal 20-valent conjugate vaccine  0.5 mL Intramuscular Tomorrow-1000   sevelamer carbonate  800 mg Oral TID WC    BMET    Component Value Date/Time   NA 133 (L) 01/04/2022 0021   K 4.2 01/04/2022 0021   CL 99 01/04/2022 0021   CO2 26 01/04/2022 0021   GLUCOSE 88 01/04/2022 0021   BUN 37 (H) 01/04/2022 0021   CREATININE 7.77 (H) 01/04/2022 0021   CALCIUM 8.6 (L) 01/04/2022 0021   GFRNONAA 7 (L) 01/04/2022 0021   GFRAA  03/28/2007 0154    >60        The eGFR has been calculated using the MDRD equation. This calculation has not been validated in all clinical   CBC    Component Value Date/Time   WBC 8.9 01/04/2022 0021  RBC 2.69 (L) 01/04/2022 0021   HGB 8.3 (L) 01/04/2022 0021   HCT 24.7 (L) 01/04/2022 0021   PLT 177 01/04/2022 0021   MCV 91.8 01/04/2022 0021   MCH 30.9 01/04/2022 0021   MCHC 33.6 01/04/2022 0021   RDW 14.4 01/04/2022 0021   LYMPHSABS 0.5 (L) 12/28/2021 2008   MONOABS 0.4 12/28/2021 2008   EOSABS 0.3 12/28/2021 2008   BASOSABS 0.1 12/28/2021 2008     Assessment/Plan: Acute kidney injury on chronic kidney disease- Unclear if this is evolution to ESRD: Suspected to have had progression of chronic kidney disease and with development of uremic symptoms and started on hemodialysis 5/26 with second treatment on 5/27.  Unclear if this is end-stage renal disease or if he will have meaningful recovery of renal function.  His SCr was 0.9 in March 2022 but then was 4.6 in April 2023.  Seen at [facility] and felt to be due to hypertensive emergency.  No significant renal recovery.   Set I[ for OP HD at [facility] on MWF schedule at 6:30 am.   Will maintain HD on MWF schedule Symptomatic  anemia: Unclear if he had any overt losses. Iron replete on labs, ESA started 5/29.Hgb from 10.7 to 8.7 on 5/30, s/p 1u prbc, 8.3 6/2. would recommend checking a FOBT--will defer to primary service History of CVA: on DAPT, per primary Hypertension: Resume oral antihypertensive agents and monitor with ultrafiltration/hemodialysis. Chronic kidney disease-metabolic bone disease: Continue to monitor calcium and phosphorus levels, corrected cal WNL. On renvela Nutrition: Likely compromised by advancing chronic kidney disease and history of alcohol use.  Monitor on ONS. Push protein  Donetta Potts, MD [facility]

## 2022-01-04 NOTE — Progress Notes (Signed)
Out Patient Arrangements:  Pt has been accepted @ [facility] on their MWF shift w/ a time of 6:30 am  The address is  [address] [place],  [phone]  Please advise of d/c date.  Linus Orn HPSS [phone]

## 2022-01-04 NOTE — Progress Notes (Signed)
PT Cancellation Note  Patient Details Name: CAM DAUPHIN MRN: [id number] DOB: [birth year]   Cancelled Treatment:    Reason Eval/Treat Not Completed: Patient at procedure or test/unavailable  Patient off the unit.   Arby Barrette, PT Acute Rehabilitation Services  Pager [phone] Office [phone]  Rexanne Mano 01/04/2022, 9:59 AM

## 2022-01-04 NOTE — Progress Notes (Signed)
Report given to Needles, RN of [facility] [phone] at this time.

## 2022-01-04 NOTE — Discharge Instructions (Signed)
Follow with Primary MD Pcp, No in 7 days   Get CBC, CMP, 2 view Chest X ray -  checked next visit within 1 week by Primary MD SNF MD   Activity: As tolerated with Full fall precautions use walker/cane & assistance as needed  Disposition SNF  Diet: - Renal, 1200 mL Fluid  Diet effective now         Special Instructions: If you have smoked or chewed Tobacco  in the last 2 yrs please stop smoking, stop any regular Alcohol  and or any Recreational drug use.  On your next visit with your primary care physician please Get Medicines reviewed and adjusted.  Please request your Prim.MD to go over all Hospital Tests and Procedure/Radiological results at the follow up, please get all Hospital records sent to your Prim MD by signing hospital release before you go home.  If you experience worsening of your admission symptoms, develop shortness of breath, life threatening emergency, suicidal or homicidal thoughts you must seek medical attention immediately by calling 911 or calling your MD immediately  if symptoms less severe.  You Must read complete instructions/literature along with all the possible adverse reactions/side effects for all the Medicines you take and that have been prescribed to you. Take any new Medicines after you have completely understood and accpet all the possible adverse reactions/side effects.

## 2022-01-04 NOTE — TOC Transition Note (Signed)
Transition of Care [facility]) - CM/SW Discharge Note   Patient Details  Name: Cody Macdonald MRN: [id number] Date of Birth: 01/25/1961  [facility] CM/SW Contact:  Benard Halsted, [place] Phone Number: 01/04/2022, 12:35 PM   Clinical Narrative:    Patient will DC to: [facility] Anticipated DC date: 01/04/22 Family notified: Pt notified Transport by: [facility] 2:40pm   Per MD patient ready for DC to [facility]. RN to call report prior to discharge ([phone]). RN, patient, patient's family, and facility notified of DC. Discharge Summary and FL2 sent to facility.   CSW will sign off for now as social work intervention is no longer needed. Please consult [place] again if new needs arise.     Final next level of care: Skilled Nursing Facility Barriers to Discharge: Barriers Resolved   Patient Goals and CMS Choice Patient states their goals for this hospitalization and ongoing recovery are:: SNF CMS Medicare.gov Compare Post Acute Care list provided to:: Patient Choice offered to / list presented to : Patient  Discharge Placement   Existing PASRR number confirmed : 01/04/22          Patient chooses bed at: [facility] Patient to be transferred to facility by: [facility]   Patient and family notified of of transfer: 01/04/22  Discharge Plan and Services In-house Referral: Clinical Social Work   Post Acute Care Choice: [facility], Dialysis                               Social Determinants of Health (SDOH) Interventions     Readmission Risk Interventions     View : No data to display.

## 2022-04-04 ENCOUNTER — Observation Stay (HOSPITAL_COMMUNITY)
Admission: EM | Admit: 2022-04-04 | Discharge: 2022-04-12 | Disposition: A | Discharge status: Skilled Nursing Facility | Payer: Medicaid Other | Attending: Student in an Organized Health Care Education/Training Program | Admitting: Student in an Organized Health Care Education/Training Program

## 2022-04-04 ENCOUNTER — Other Ambulatory Visit: Payer: Self-pay

## 2022-04-04 ENCOUNTER — Encounter (HOSPITAL_COMMUNITY): Payer: Self-pay

## 2022-04-04 ENCOUNTER — Emergency Department (HOSPITAL_COMMUNITY): Payer: Medicaid Other

## 2022-04-04 DIAGNOSIS — E8770 Fluid overload, unspecified: Secondary | ICD-10-CM | POA: Insufficient documentation

## 2022-04-04 DIAGNOSIS — I10 Essential (primary) hypertension: Secondary | ICD-10-CM

## 2022-04-04 DIAGNOSIS — Z8673 Personal history of transient ischemic attack (TIA), and cerebral infarction without residual deficits: Secondary | ICD-10-CM | POA: Diagnosis not present

## 2022-04-04 DIAGNOSIS — D631 Anemia in chronic kidney disease: Secondary | ICD-10-CM | POA: Diagnosis not present

## 2022-04-04 DIAGNOSIS — J811 Chronic pulmonary edema: Secondary | ICD-10-CM | POA: Diagnosis not present

## 2022-04-04 DIAGNOSIS — R0602 Shortness of breath: Secondary | ICD-10-CM | POA: Diagnosis present

## 2022-04-04 DIAGNOSIS — N186 End stage renal disease: Secondary | ICD-10-CM | POA: Diagnosis not present

## 2022-04-04 DIAGNOSIS — E873 Alkalosis: Secondary | ICD-10-CM | POA: Diagnosis not present

## 2022-04-04 DIAGNOSIS — Z79899 Other long term (current) drug therapy: Secondary | ICD-10-CM | POA: Insufficient documentation

## 2022-04-04 DIAGNOSIS — Z7982 Long term (current) use of aspirin: Secondary | ICD-10-CM | POA: Insufficient documentation

## 2022-04-04 DIAGNOSIS — E46 Unspecified protein-calorie malnutrition: Secondary | ICD-10-CM | POA: Diagnosis not present

## 2022-04-04 DIAGNOSIS — F17210 Nicotine dependence, cigarettes, uncomplicated: Secondary | ICD-10-CM | POA: Insufficient documentation

## 2022-04-04 DIAGNOSIS — J9601 Acute respiratory failure with hypoxia: Secondary | ICD-10-CM | POA: Insufficient documentation

## 2022-04-04 DIAGNOSIS — I160 Hypertensive urgency: Secondary | ICD-10-CM | POA: Diagnosis not present

## 2022-04-04 DIAGNOSIS — I161 Hypertensive emergency: Secondary | ICD-10-CM

## 2022-04-04 DIAGNOSIS — Z7902 Long term (current) use of antithrombotics/antiplatelets: Secondary | ICD-10-CM | POA: Diagnosis not present

## 2022-04-04 DIAGNOSIS — M898X9 Other specified disorders of bone, unspecified site: Secondary | ICD-10-CM | POA: Diagnosis not present

## 2022-04-04 DIAGNOSIS — I120 Hypertensive chronic kidney disease with stage 5 chronic kidney disease or end stage renal disease: Secondary | ICD-10-CM | POA: Diagnosis not present

## 2022-04-04 DIAGNOSIS — Z992 Dependence on renal dialysis: Secondary | ICD-10-CM | POA: Insufficient documentation

## 2022-04-04 LAB — CBC WITH DIFFERENTIAL/PLATELET
Abs Immature Granulocytes: 0.03 10*3/uL (ref 0.00–0.07)
Basophils Absolute: 0.1 10*3/uL (ref 0.0–0.1)
Basophils Relative: 1 %
Eosinophils Absolute: 0.4 10*3/uL (ref 0.0–0.5)
Eosinophils Relative: 6 %
HCT: 36.2 % — ABNORMAL LOW (ref 39.0–52.0)
Hemoglobin: 11.6 g/dL — ABNORMAL LOW (ref 13.0–17.0)
Immature Granulocytes: 0 %
Lymphocytes Relative: 21 %
Lymphs Abs: 1.7 10*3/uL (ref 0.7–4.0)
MCH: 30 pg (ref 26.0–34.0)
MCHC: 32 g/dL (ref 30.0–36.0)
MCV: 93.5 fL (ref 80.0–100.0)
Monocytes Absolute: 0.4 10*3/uL (ref 0.1–1.0)
Monocytes Relative: 5 %
Neutro Abs: 5.4 10*3/uL (ref 1.7–7.7)
Neutrophils Relative %: 67 %
Platelets: 213 10*3/uL (ref 150–400)
RBC: 3.87 MIL/uL — ABNORMAL LOW (ref 4.22–5.81)
RDW: 15.5 % (ref 11.5–15.5)
WBC: 8 10*3/uL (ref 4.0–10.5)
nRBC: 0 % (ref 0.0–0.2)

## 2022-04-04 LAB — I-STAT VENOUS BLOOD GAS, ED
Acid-Base Excess: 5 mmol/L — ABNORMAL HIGH (ref 0.0–2.0)
Bicarbonate: 27.5 mmol/L (ref 20.0–28.0)
Calcium, Ion: 1.17 mmol/L (ref 1.15–1.40)
HCT: 36 % — ABNORMAL LOW (ref 39.0–52.0)
Hemoglobin: 12.2 g/dL — ABNORMAL LOW (ref 13.0–17.0)
O2 Saturation: 94 %
Potassium: 5.3 mmol/L — ABNORMAL HIGH (ref 3.5–5.1)
Sodium: 141 mmol/L (ref 135–145)
TCO2: 29 mmol/L (ref 22–32)
pCO2, Ven: 32.1 mmHg — ABNORMAL LOW (ref 44–60)
pH, Ven: 7.542 — ABNORMAL HIGH (ref 7.25–7.43)
pO2, Ven: 59 mmHg — ABNORMAL HIGH (ref 32–45)

## 2022-04-04 LAB — CBC
HCT: 37.4 % — ABNORMAL LOW (ref 39.0–52.0)
Hemoglobin: 11.7 g/dL — ABNORMAL LOW (ref 13.0–17.0)
MCH: 29.8 pg (ref 26.0–34.0)
MCHC: 31.3 g/dL (ref 30.0–36.0)
MCV: 95.4 fL (ref 80.0–100.0)
Platelets: 228 10*3/uL (ref 150–400)
RBC: 3.92 MIL/uL — ABNORMAL LOW (ref 4.22–5.81)
RDW: 15.6 % — ABNORMAL HIGH (ref 11.5–15.5)
WBC: 8.7 10*3/uL (ref 4.0–10.5)
nRBC: 0 % (ref 0.0–0.2)

## 2022-04-04 LAB — HEPATITIS B SURFACE ANTIBODY,QUALITATIVE: Hep B S Ab: REACTIVE — AB

## 2022-04-04 LAB — TROPONIN I (HIGH SENSITIVITY): Troponin I (High Sensitivity): 20 ng/L — ABNORMAL HIGH (ref <18)

## 2022-04-04 LAB — CREATININE, SERUM
Creatinine, Ser: 7.24 mg/dL — ABNORMAL HIGH (ref 0.61–1.24)
GFR, Estimated: 8 mL/min — ABNORMAL LOW (ref >60)

## 2022-04-04 LAB — BASIC METABOLIC PANEL
Anion gap: 11 (ref 5–15)
BUN: 30 mg/dL — ABNORMAL HIGH (ref 6–20)
CO2: 25 mmol/L (ref 22–32)
Calcium: 9.8 mg/dL (ref 8.9–10.3)
Chloride: 104 mmol/L (ref 98–111)
Creatinine, Ser: 6.8 mg/dL — ABNORMAL HIGH (ref 0.61–1.24)
GFR, Estimated: 9 mL/min — ABNORMAL LOW (ref >60)
Glucose, Bld: 87 mg/dL (ref 70–99)
Potassium: 5.2 mmol/L — ABNORMAL HIGH (ref 3.5–5.1)
Sodium: 140 mmol/L (ref 135–145)

## 2022-04-04 LAB — BRAIN NATRIURETIC PEPTIDE: B Natriuretic Peptide: >4500 pg/mL — ABNORMAL HIGH (ref 0.0–100.0)

## 2022-04-04 LAB — HEPATITIS B SURFACE ANTIGEN: Hepatitis B Surface Ag: NONREACTIVE

## 2022-04-04 LAB — PHOSPHORUS: Phosphorus: 4.9 mg/dL — ABNORMAL HIGH (ref 2.5–4.6)

## 2022-04-04 LAB — HEPATITIS B CORE ANTIBODY, TOTAL: Hep B Core Total Ab: NONREACTIVE

## 2022-04-04 LAB — HEPATITIS C ANTIBODY: HCV Ab: NONREACTIVE

## 2022-04-04 LAB — HIV ANTIBODY (ROUTINE TESTING W REFLEX): HIV Screen 4th Generation wRfx: NONREACTIVE — AB

## 2022-04-04 MED ORDER — ALBUTEROL SULFATE (2.5 MG/3ML) 0.083% IN NEBU
2.5000 mg | INHALATION_SOLUTION | Freq: Four times a day (QID) | RESPIRATORY_TRACT | Status: DC | PRN
Start: 2022-04-04 — End: 2022-04-12

## 2022-04-04 MED ORDER — CLOPIDOGREL BISULFATE 75 MG PO TABS
75.0000 mg | ORAL_TABLET | Freq: Every day | ORAL | Status: DC
  Administered 2022-04-04 – 2022-04-12 (×9): 75 mg via ORAL
  Filled 2022-04-04 (×9): qty 1

## 2022-04-04 MED ORDER — LORAZEPAM 2 MG/ML IJ SOLN
1.0000 mg | Freq: Once | INTRAMUSCULAR | Status: AC
Start: 2022-04-04 — End: 2022-04-04
  Administered 2022-04-04: 1 mg via INTRAVENOUS
  Filled 2022-04-04: qty 1

## 2022-04-04 MED ORDER — ALTEPLASE 2 MG IJ SOLR
2.0000 mg | Freq: Once | INTRAMUSCULAR | Status: AC | PRN
  Administered 2022-04-04: 2 mg
  Filled 2022-04-04: qty 2

## 2022-04-04 MED ORDER — ANTICOAGULANT SODIUM CITRATE 4% (200MG/5ML) IV SOLN: 5.0000 mL | Status: DC | PRN

## 2022-04-04 MED ORDER — LABETALOL HCL 5 MG/ML IV SOLN
20.0000 mg | Freq: Once | INTRAVENOUS | Status: AC
  Administered 2022-04-04: 20 mg via INTRAVENOUS
  Filled 2022-04-04: qty 4

## 2022-04-04 MED ORDER — HYDROXYZINE HCL 10 MG PO TABS: 10.0000 mg | ORAL_TABLET | Freq: Once | ORAL | Status: DC

## 2022-04-04 MED ORDER — HEPARIN SODIUM (PORCINE) 1000 UNIT/ML DIALYSIS: 1000.0000 [IU] | INTRAMUSCULAR | Status: DC | PRN

## 2022-04-04 MED ORDER — HEPARIN SODIUM (PORCINE) 1000 UNIT/ML DIALYSIS
20.0000 [IU]/kg | INTRAMUSCULAR | Status: DC | PRN
  Administered 2022-04-04: 1100 [IU] via INTRAVENOUS_CENTRAL
  Filled 2022-04-04: qty 2

## 2022-04-04 MED ORDER — HYDRALAZINE HCL 50 MG PO TABS
50.0000 mg | ORAL_TABLET | Freq: Three times a day (TID) | ORAL | Status: DC
  Administered 2022-04-04 – 2022-04-12 (×24): 50 mg via ORAL
  Filled 2022-04-04 (×8): qty 1
  Filled 2022-04-04: qty 2
  Filled 2022-04-04 (×15): qty 1

## 2022-04-04 MED ORDER — HEPARIN SODIUM (PORCINE) 5000 UNIT/ML IJ SOLN
5000.0000 [IU] | Freq: Three times a day (TID) | INTRAMUSCULAR | Status: DC
Start: 2022-04-04 — End: 2022-04-12
  Administered 2022-04-04 – 2022-04-12 (×24): 5000 [IU] via SUBCUTANEOUS
  Filled 2022-04-04 (×23): qty 1

## 2022-04-04 MED ORDER — ATORVASTATIN CALCIUM 80 MG PO TABS
80.0000 mg | ORAL_TABLET | Freq: Every day | ORAL | Status: DC
  Administered 2022-04-04 – 2022-04-11 (×8): 80 mg via ORAL
  Filled 2022-04-04 (×8): qty 1

## 2022-04-04 MED ORDER — CHLORHEXIDINE GLUCONATE CLOTH 2 % EX PADS
6.0000 | MEDICATED_PAD | Freq: Every day | CUTANEOUS | Status: DC
Start: 2022-04-04 — End: 2022-04-05
  Administered 2022-04-05: 6 via TOPICAL

## 2022-04-04 MED ORDER — ALBUTEROL SULFATE (2.5 MG/3ML) 0.083% IN NEBU
INHALATION_SOLUTION | RESPIRATORY_TRACT | Status: AC
  Administered 2022-04-04: 2.5 mg
  Filled 2022-04-04: qty 3

## 2022-04-04 MED ORDER — LABETALOL HCL 300 MG PO TABS
300.0000 mg | ORAL_TABLET | Freq: Two times a day (BID) | ORAL | Status: DC
  Administered 2022-04-04 – 2022-04-12 (×17): 300 mg via ORAL
  Filled 2022-04-04 (×14): qty 1
  Filled 2022-04-04: qty 1.5
  Filled 2022-04-04 (×2): qty 1

## 2022-04-04 MED ORDER — AMLODIPINE BESYLATE 10 MG PO TABS
10.0000 mg | ORAL_TABLET | Freq: Every day | ORAL | Status: DC
  Administered 2022-04-04 – 2022-04-12 (×9): 10 mg via ORAL
  Filled 2022-04-04 (×4): qty 1
  Filled 2022-04-04: qty 2
  Filled 2022-04-04 (×4): qty 1
  Filled 2022-04-04: qty 2

## 2022-04-04 MED ORDER — ASPIRIN 81 MG PO TBEC
81.0000 mg | DELAYED_RELEASE_TABLET | Freq: Every day | ORAL | Status: DC
  Administered 2022-04-04 – 2022-04-12 (×9): 81 mg via ORAL
  Filled 2022-04-04 (×9): qty 1

## 2022-04-04 MED ORDER — VITAMIN D (ERGOCALCIFEROL) 1.25 MG (50000 UNIT) PO CAPS
50000.0000 [IU] | ORAL_CAPSULE | ORAL | Status: DC
  Administered 2022-04-06: 50000 [IU] via ORAL
  Filled 2022-04-04: qty 1

## 2022-04-04 NOTE — Discharge Instructions (Addendum)
Dear Cody Macdonald, thank you for trusting [place] with your care. We treated you in the hospital for respiratory failure from fluid buildup due to your kidney disease. Please continue to follow with the kidney doctors for dialysis once discharged. You are scheduled for dialysis access surgery with the surgeons on Sept 18th. Please HOLD your Plavix starting Sept. 13th. Talk with your doctor about resuming Plavix after the surgery.

## 2022-04-04 NOTE — ED Notes (Signed)
Pt oral temp 96.3. Beir hugger placed on pt. Admitting team at bedside and made aware of pt status.

## 2022-04-04 NOTE — ED Notes (Signed)
Respiratory at bedside.

## 2022-04-04 NOTE — ED Provider Notes (Signed)
[facility] EMERGENCY DEPARTMENT Provider Note   CSN: [id number] Arrival date & time: 04/04/22  0447     History  Chief Complaint  Patient presents with   Shortness of Breath    Cody Macdonald is a 61 y.o. male.  61 yo M with a cc of sob.  Going on for the past couple days.  No cough, no fever.  He thinks this may be feels like when he had needed blood transfusion in the past.  Denies any areas of bleeding.   Shortness of Breath      Home Medications Prior to Admission medications   Medication Sig Start Date End Date Taking? Authorizing Provider  albuterol (PROVENTIL) (2.5 MG/3ML) 0.083% nebulizer solution Take 3 mLs (2.5 mg total) by nebulization every 6 (six) hours as needed for wheezing or shortness of breath. 01/01/22   Ghimire, Henreitta Leber, MD  amLODipine (NORVASC) 10 MG tablet Take 1 tablet (10 mg total) by mouth daily. 01/01/22   Ghimire, Henreitta Leber, MD  aspirin EC 81 MG tablet Take 81 mg by mouth daily. Swallow whole.    [provider]  atorvastatin (LIPITOR) 80 MG tablet Take 80 mg by mouth at bedtime. 12/22/21   [provider]  clopidogrel (PLAVIX) 75 MG tablet Take 75 mg by mouth daily. 12/20/21   [provider]  ergocalciferol (VITAMIN D2) 1.25 MG (50000 UT) capsule Take 50,000 Units by mouth every Tuesday.    [provider]  feeding supplement (ENSURE ENLIVE / ENSURE PLUS) LIQD Take 237 mLs by mouth 2 (two) times daily between meals. 01/01/22   Ghimire, Henreitta Leber, MD  hydrALAZINE (APRESOLINE) 50 MG tablet Take 1 tablet (50 mg total) by mouth every 8 (eight) hours. 01/01/22   Ghimire, Henreitta Leber, MD  labetalol (NORMODYNE) 300 MG tablet Take 1 tablet (300 mg total) by mouth 2 (two) times daily. 01/01/22   Ghimire, Henreitta Leber, MD  melatonin 3 MG TABS tablet Take 3 mg by mouth at bedtime as needed (sleep).    [provider]  multivitamin (RENA-VIT) TABS tablet Take 1 tablet by mouth at bedtime. 01/01/22   Ghimire,  Henreitta Leber, MD  oxyCODONE-acetaminophen (PERCOCET) 5-325 MG tablet Take 1 tablet by mouth every 8 (eight) hours as needed for severe pain. 01/01/22 01/01/23  Ghimire, Henreitta Leber, MD  pantoprazole (PROTONIX) 40 MG tablet Take 40 mg by mouth daily. 12/20/21   [provider]  sevelamer carbonate (RENVELA) 800 MG tablet Take 1 tablet (800 mg total) by mouth 3 (three) times daily with meals. 01/01/22   Ghimire, Henreitta Leber, MD      Allergies    Patient has no known allergies.    Review of Systems   Review of Systems  Respiratory:  Positive for shortness of breath.     Physical Exam Updated Vital Signs BP (!) 203/103   Pulse 92   Temp (!) 96.9 F (36.1 C) (Oral)   Resp 18   Ht 5\' 8"  (1.727 m)   Wt 54 kg   SpO2 94%   BMI 18.09 kg/m  Physical Exam Vitals and nursing note reviewed.  Constitutional:      Appearance: He is well-developed.  HENT:     Head: Normocephalic and atraumatic.  Eyes:     Pupils: Pupils are equal, round, and reactive to light.  Neck:     Vascular: No JVD.  Cardiovascular:     Rate and Rhythm: Normal rate and regular rhythm.  Heart sounds: No murmur heard.    No friction rub. No gallop.  Pulmonary:     Effort: No respiratory distress.     Breath sounds: Decreased breath sounds present. No wheezing.     Comments: Diminished breath sounds in all fields Abdominal:     General: There is no distension.     Tenderness: There is no abdominal tenderness. There is no guarding or rebound.  Musculoskeletal:        General: Normal range of motion.     Cervical back: Normal range of motion and neck supple.  Skin:    Coloration: Skin is not pale.     Findings: No rash.  Neurological:     Mental Status: He is alert and oriented to person, place, and time.  Psychiatric:        Behavior: Behavior normal.     ED Results / Procedures / Treatments   Labs (all labs ordered are listed, but only abnormal results are displayed) Labs Reviewed  CBC WITH  DIFFERENTIAL/PLATELET - Abnormal; Notable for the following components:      Result Value   RBC 3.87 (*)    Hemoglobin 11.6 (*)    HCT 36.2 (*)    All other components within normal limits  BASIC METABOLIC PANEL - Abnormal; Notable for the following components:   Potassium 5.2 (*)    BUN 30 (*)    Creatinine, Ser 6.80 (*)    GFR, Estimated 9 (*)    All other components within normal limits  I-STAT VENOUS BLOOD GAS, ED - Abnormal; Notable for the following components:   pH, Ven 7.542 (*)    pCO2, Ven 32.1 (*)    pO2, Ven 59 (*)    Acid-Base Excess 5.0 (*)    Potassium 5.3 (*)    HCT 36.0 (*)    Hemoglobin 12.2 (*)    All other components within normal limits  TROPONIN I (HIGH SENSITIVITY) - Abnormal; Notable for the following components:   Troponin I (High Sensitivity) 20 (*)    All other components within normal limits  BRAIN NATRIURETIC PEPTIDE    EKG None  Radiology DG Chest Port 1 View  Result Date: 04/04/2022 CLINICAL DATA:  Shortness of breath. EXAM: PORTABLE CHEST 1 VIEW COMPARISON:  12/28/2021 FINDINGS: Diffuse interstitial and basilar airspace disease, right greater than left is likely related to pulmonary edema although multifocal pneumonia is not excluded. Probable tiny right pleural effusion. Cardiopericardial silhouette is at upper limits of normal for size. Right central line again noted. Telemetry leads overlie the chest. IMPRESSION: Diffuse interstitial and basilar airspace disease, right greater than left, likely related to pulmonary edema although multifocal pneumonia is not excluded. Electronically Signed   By: Misty Stanley M.D.   On: 04/04/2022 05:38    Procedures Procedures    Medications Ordered in ED Medications - No data to display  ED Course/ Medical Decision Making/ A&P                           Medical Decision Making Amount and/or Complexity of Data Reviewed Labs: ordered. Radiology: ordered.   61 yo M with a significant past medical  history of ESRD, prior stroke that left him bedbound with a chief complaints of difficulty breathing.  Diminished breath sounds in all fields.  We will obtain a chest x-ray blood work reassess.  Chest x-ray independently turbid by me with increased fluid overload.  VBG without hypercarbia.  Patient with no significant anemia.  I discussed results with the patient.  He seems to think that he has dialysis today.  I discussed the case with nephrology on-call, Dr. Carolin Sicks recommended fluid restriction and the patient likely has dialysis tomorrow.  7:14 AM:  I have discussed the diagnosis/risks/treatment options with the patient.  Evaluation and diagnostic testing in the emergency department does not suggest an emergent condition requiring admission or immediate intervention beyond what has been performed at this time.  They will follow up with  PCP. We also discussed returning to the ED immediately if new or worsening sx occur. We discussed the sx which are most concerning (e.g., sudden worsening pain, fever, inability to tolerate by mouth) that necessitate immediate return. Medications administered to the patient during their visit and any new prescriptions provided to the patient are listed below.  Medications given during this visit Medications - No data to display   The patient appears reasonably screen and/or stabilized for discharge and I doubt any other medical condition or other [facility] requiring further screening, evaluation, or treatment in the ED at this time prior to discharge.          Final Clinical Impression(s) / ED Diagnoses Final diagnoses:  SOB (shortness of breath)  Hypervolemia associated with renal insufficiency    Rx / DC Orders ED Discharge Orders     None         Deno Etienne, DO 04/04/22 [phone]

## 2022-04-04 NOTE — ED Provider Notes (Signed)
Patient was seen by Dr. Tyrone Nine.  Plan was for discharge.  Notified that patient's oxygen saturation dropped and he started having increasing respiratory difficulty prior to discharge.  Patient blood pressure noted to be 223/120.    Patient is a renal failure patient IV labetalol ordered.  Patient's chest x-ray did show infiltrates suggestive of either pneumonia or pulmonary edema.  I think his symptoms are likely related to fluid overload and pulmonary edema.  I do not think the patient can wait for dialysis until tomorrow.  I will contact nephrology again and the hospitalist service for admission  Case discussed with Dr Carolin Sicks.  WIll add pt to list for dialysis.  Plan on observation admission   Dorie Rank, MD 04/04/22 [phone]

## 2022-04-04 NOTE — Consult Note (Signed)
[facility] Nephrology Consult Note: Reason for Consult: To manage dialysis and dialysis related needs Referring Physician: Dr. Tomi Bamberger, Wille Glaser  HPI:  Cody Macdonald is an 61 y.o. male with history of hypertension, HLD, ESRD on HD MWF at [facility] presents from [place] facility with shortness of breath for few days, seen as a consultation for the management of ESRD. Patient reported that he had about 3 hours of dialysis on last Friday without any event.  He does not remember the amount of fluid taken. In the ER, his blood pressure elevated to 223/125, hypoxic initially required nonrebreather and then on 4 L of oxygen.  The labs showed potassium level of 5.3, BUN 30, creatinine 6.8, hemoglobin of 12.2.  Chest x-ray with pulmonary edema but unable to rule out multifocal pneumonia. The patient said he is feeling fatigue tired and shortness of breath.  He denies headache, dizziness, cough, chest pain, nausea or vomiting.  No fever.  Past Medical History:  Diagnosis Date   Chronic kidney disease    Hypertension    Stroke [facility])     Past Surgical History:  Procedure Laterality Date   IR FLUORO GUIDE CV LINE RIGHT  12/25/2021   IR US GUIDE VASC ACCESS RIGHT  12/25/2021    History reviewed. No pertinent family history.  Social History:  reports that he has been smoking cigarettes. He has been smoking an average of .25 packs per day. He has never used smokeless tobacco. He reports current alcohol use of about 1.0 standard drink of alcohol per week. He reports current drug use. Drug: Marijuana.  Allergies: No Known Allergies  Medications: I have reviewed the patient's current medications.   Results for orders placed or performed during the hospital encounter of 04/04/22 (from the past 48 hour(s))  CBC with Differential     Status: Abnormal   Collection Time: 04/04/22  5:13 AM  Result Value Ref Range   WBC 8.0 4.0 - 10.5 K/uL   RBC 3.87 (L) 4.22 - 5.81 MIL/uL    Hemoglobin 11.6 (L) 13.0 - 17.0 g/dL   HCT 36.2 (L) 39.0 - 52.0 %   MCV 93.5 80.0 - 100.0 fL   MCH 30.0 26.0 - 34.0 pg   MCHC 32.0 30.0 - 36.0 g/dL   RDW 15.5 11.5 - 15.5 %   Platelets 213 150 - 400 K/uL   nRBC 0.0 0.0 - 0.2 %   Neutrophils Relative % 67 %   Neutro Abs 5.4 1.7 - 7.7 K/uL   Lymphocytes Relative 21 %   Lymphs Abs 1.7 0.7 - 4.0 K/uL   Monocytes Relative 5 %   Monocytes Absolute 0.4 0.1 - 1.0 K/uL   Eosinophils Relative 6 %   Eosinophils Absolute 0.4 0.0 - 0.5 K/uL   Basophils Relative 1 %   Basophils Absolute 0.1 0.0 - 0.1 K/uL   Immature Granulocytes 0 %   Abs Immature Granulocytes 0.03 0.00 - 0.07 K/uL    Comment: Performed at [facility], [address] [place], [name] [address]  Basic metabolic panel     Status: Abnormal   Collection Time: 04/04/22  5:13 AM  Result Value Ref Range   Sodium 140 135 - 145 mmol/L   Potassium 5.2 (H) 3.5 - 5.1 mmol/L   Chloride 104 98 - 111 mmol/L   CO2 25 22 - 32 mmol/L   Glucose, Bld 87 70 - 99 mg/dL    Comment: Glucose reference range applies only to  samples taken after fasting for at least 8 hours.   BUN 30 (H) 6 - 20 mg/dL   Creatinine, Ser 6.80 (H) 0.61 - 1.24 mg/dL   Calcium 9.8 8.9 - 10.3 mg/dL   GFR, Estimated 9 (L) >60 mL/min    Comment: (NOTE) Calculated using the CKD-EPI Creatinine Equation (2021)    Anion gap 11 5 - 15    Comment: Performed at [facility] [address] [place], [place] [address]  Troponin I (High Sensitivity)     Status: Abnormal   Collection Time: 04/04/22  5:13 AM  Result Value Ref Range   Troponin I (High Sensitivity) 20 (H) <18 ng/L    Comment: (NOTE) Elevated high sensitivity troponin I (hsTnI) values and significant  changes across serial measurements may suggest ACS but many other  chronic and acute conditions are known to elevate hsTnI results.  Refer to the "Links" section for chest pain algorithms and additional  guidance. Performed at [facility],  [place] [address], [place], [place] [address]   Brain natriuretic peptide     Status: Abnormal   Collection Time: 04/04/22  5:13 AM  Result Value Ref Range   B Natriuretic Peptide >4,500.0 (H) 0.0 - 100.0 pg/mL    Comment: Performed at [facility] [address] [place], [place] [address]  I-Stat venous blood gas, ED     Status: Abnormal   Collection Time: 04/04/22  5:38 AM  Result Value Ref Range   pH, Ven 7.542 (H) 7.25 - 7.43   pCO2, Ven 32.1 (L) 44 - 60 mmHg   pO2, Ven 59 (H) 32 - 45 mmHg   Bicarbonate 27.5 20.0 - 28.0 mmol/L   TCO2 29 22 - 32 mmol/L   O2 Saturation 94 %   Acid-Base Excess 5.0 (H) 0.0 - 2.0 mmol/L   Sodium 141 135 - 145 mmol/L   Potassium 5.3 (H) 3.5 - 5.1 mmol/L   Calcium, Ion 1.17 1.15 - 1.40 mmol/L   HCT 36.0 (L) 39.0 - 52.0 %   Hemoglobin 12.2 (L) 13.0 - 17.0 g/dL   Sample type VENOUS     DG Chest Port 1 View  Result Date: 04/04/2022 CLINICAL DATA:  Shortness of breath. EXAM: PORTABLE CHEST 1 VIEW COMPARISON:  12/28/2021 FINDINGS: Diffuse interstitial and basilar airspace disease, right greater than left is likely related to pulmonary edema although multifocal pneumonia is not excluded. Probable tiny right pleural effusion. Cardiopericardial silhouette is at upper limits of normal for size. Right central line again noted. Telemetry leads overlie the chest. IMPRESSION: Diffuse interstitial and basilar airspace disease, right greater than left, likely related to pulmonary edema although multifocal pneumonia is not excluded. Electronically Signed   By: Misty Stanley M.D.   On: 04/04/2022 05:38    ROS: As per H&P.  Rest of the systems are reviewed and negative. Blood pressure (!) 188/108, pulse 80, temperature (!) 96.3 F (35.7 C), temperature source Oral, resp. rate (!) 26, height 5\' 8"  (1.727 m), weight 54 kg, SpO2 97 %. Gen: NAD, comfortable Respiratory: Coarse breath sound bilateral, no wheeze or crackle. Cardiovascular: Regular rate rhythm S1-S2 normal, no  rubs GI: Abdomen soft, nontender, nondistended Extremities, no cyanosis or clubbing, no edema Skin: No rash or ulcer Neurology: Alert, awake, following commands,  Dialysis Access: Right IJ TDC in place.  Assessment/Plan:  #Acute hypoxic respiratory failure/acute pulmonary edema: We will do dialysis today with goal ultrafiltration of around 3.5 L.  The chest x-ray  is unable to rule out multifocal pneumonia, the medical team is evaluating the patient.  # ESRD: MWF, plan for HD today for ultrafiltration.  Per patient he goes to [facility].  He has right IJ for the access.  I have discussed with the dialysis nurse.  #Hypertensive urgency: With fluid overload.  HD today and resume home antihypertensives.  # Anemia of ESRD: Hemoglobin above goal.  # Metabolic Bone Disease: Monitor calcium and phosphorus level.  May need to obtain outpatient treatment record if patient remains in the hospital.  Thank you for the consult.  Avner Stroder Tanna Furry 04/04/2022, 9:23 AM

## 2022-04-04 NOTE — H&P (Cosign Needed Addendum)
Date: 04/04/2022               Patient Name:  Cody Macdonald MRN: [id number]  DOB: 01/30/1961 Age / Sex: 61 y.o., male   PCP: Pcp, No         Medical Service: Internal Medicine Teaching Service         Attending Physician: Dr. Velna Ochs, MD    First Contact: Delene Ruffini, MD Pager: [phone]  Second Contact: Jeanie Cooks, MD Pager: Governor Rooks [phone]       After Hours (After 5p/  First Contact Pager: [phone]  weekends / holidays): Second Contact Pager: [phone]   SUBJECTIVE   Chief Complaint: Shortness of breath  History of Present Illness:  61 year old male with a hx of HD MWF HTN presented with complaints of progressive shortness of breath over the past two weeks. Patient reports living in shelter with poor access to healthcare (though upon review of records appears to live in [facility]). He appears to have poor health literacy, but does endorse compliance with HD. Reports that he has not missed any sessions, denies difficulty finishing sessions. He is not sure how much fluid is removed each session. He does produce some urine. He does endorse swelling in hands and feet. He denies any fever, cough, or chest pain. No sick contacts either.   Meds:  Current Meds  Medication Sig   albuterol (PROVENTIL) (2.5 MG/3ML) 0.083% nebulizer solution Take 3 mLs (2.5 mg total) by nebulization every 6 (six) hours as needed for wheezing or shortness of breath.   amLODipine (NORVASC) 10 MG tablet Take 1 tablet (10 mg total) by mouth daily.   aspirin EC 81 MG tablet Take 81 mg by mouth daily. Swallow whole.   atorvastatin (LIPITOR) 80 MG tablet Take 80 mg by mouth at bedtime.   clopidogrel (PLAVIX) 75 MG tablet Take 75 mg by mouth daily.   ergocalciferol (VITAMIN D2) 1.25 MG (50000 UT) capsule Take 50,000 Units by mouth every Tuesday.   hydrALAZINE (APRESOLINE) 50 MG tablet Take 1 tablet (50 mg total) by mouth every 8 (eight) hours. (Patient taking differently: Take 50 mg by mouth 3  (three) times daily.)   labetalol (NORMODYNE) 300 MG tablet Take 1 tablet (300 mg total) by mouth 2 (two) times daily.   melatonin 3 MG TABS tablet Take 3 mg by mouth at bedtime.   oxyCODONE-acetaminophen (PERCOCET/ROXICET) 5-325 MG tablet Take 1 tablet by mouth every 8 (eight) hours as needed for severe pain.   pantoprazole (PROTONIX) 40 MG tablet Take 40 mg by mouth daily.   sevelamer carbonate (RENVELA) 800 MG tablet Take 1 tablet (800 mg total) by mouth 3 (three) times daily with meals.    Past Medical History:  Diagnosis Date   Chronic kidney disease    Hypertension    Stroke [facility])     Past Surgical History:  Procedure Laterality Date   IR FLUORO GUIDE CV LINE RIGHT  12/25/2021   IR US GUIDE VASC ACCESS RIGHT  12/25/2021    Social:  Lives With: in [facility] Occupation: none Support: none (brother and son live in [place]) Level of Function: requires assistance with ADLs and IADLs. Patient has had stroke previously and is wheelchair bound at baseline.  PCP: none Substances: hx alcohol and tobacco use  Family History: n/a  Allergies: Allergies as of 04/04/2022   (No Known Allergies)    Review of Systems: A complete ROS was negative except as per HPI.  OBJECTIVE:   Physical Exam: Blood pressure (!) 190/107, pulse 84, temperature 97.9 F (36.6 C), temperature source Rectal, resp. rate 19, height 5' 8"  (1.727 m), weight 54 kg, SpO2 97 %.  Constitutional: chronically ill and frail appearing male, resting in bed,  in no acute distress HENT: normocephalic atraumatic, mucous membranes moist Eyes: conjunctiva non-erythematous Neck: supple Cardiovascular: regular rate and rhythm, no m/r/g Pulmonary/Chest: normal work of breathing on room air, lungs clear to auscultation bilaterally Abdominal: soft, non-tender, non-distended MSK: diminished bulk, contracted Left side Neurological: alert & oriented to person and time (unsure of which ED he is in), strength 3+-4/5  Right side, does not move LUE or LLL Skin: warm and dry Psych: mood and affect appear flat  Labs: CBC    Component Value Date/Time   WBC 8.7 04/04/2022 1140   RBC 3.92 (L) 04/04/2022 1140   HGB 11.7 (L) 04/04/2022 1140   HCT 37.4 (L) 04/04/2022 1140   PLT 228 04/04/2022 1140   MCV 95.4 04/04/2022 1140   MCH 29.8 04/04/2022 1140   MCHC 31.3 04/04/2022 1140   RDW 15.6 (H) 04/04/2022 1140   LYMPHSABS 1.7 04/04/2022 0513   MONOABS 0.4 04/04/2022 0513   EOSABS 0.4 04/04/2022 0513   BASOSABS 0.1 04/04/2022 0513     CMP     Component Value Date/Time   NA 141 04/04/2022 0538   K 5.3 (H) 04/04/2022 0538   CL 104 04/04/2022 0513   CO2 25 04/04/2022 0513   GLUCOSE 87 04/04/2022 0513   BUN 30 (H) 04/04/2022 0513   CREATININE 7.24 (H) 04/04/2022 1140   CALCIUM 9.8 04/04/2022 0513   PROT 6.8 12/24/2021 1846   ALBUMIN 1.9 (L) 12/29/2021 0149   AST 9 (L) 12/24/2021 1846   ALT 8 12/24/2021 1846   ALKPHOS 90 12/24/2021 1846   BILITOT 0.5 12/24/2021 1846   GFRNONAA 8 (L) 04/04/2022 1140   GFRAA  03/28/2007 0154    >60        The eGFR has been calculated using the MDRD equation. This calculation has not been validated in all clinical    Imaging: DG Chest Port 1 View  Result Date: 04/04/2022 CLINICAL DATA:  Shortness of breath. EXAM: PORTABLE CHEST 1 VIEW COMPARISON:  12/28/2021 FINDINGS: Diffuse interstitial and basilar airspace disease, right greater than left is likely related to pulmonary edema although multifocal pneumonia is not excluded. Probable tiny right pleural effusion. Cardiopericardial silhouette is at upper limits of normal for size. Right central line again noted. Telemetry leads overlie the chest. IMPRESSION: Diffuse interstitial and basilar airspace disease, right greater than left, likely related to pulmonary edema although multifocal pneumonia is not excluded. Electronically Signed   By: Misty Stanley M.D.   On: 04/04/2022 05:38    EKG: personally reviewed my  interpretation is n/a   ASSESSMENT & PLAN:    Assessment & Plan by Problem: Principal Problem:   Volume overload   Cody Macdonald is a 61 y.o. with pertinent PMH of ESRD on HD who presented with SOB and admitted for volume overload on hospital day 0  # Acute hypoxic respiratory failure due to volume overload # Pulmonary edema Pulmonary edema due to volume overload in the setting of ESRD. Patient denies missed HD sessions. Received episode of HD today. Satting 100% 3L currently. Volume overloaded on exam. - Continued fluid removal per nephro - supplemental O2 as needed  # Hypertensive urgency # HTN Takes hydralazine, labetalol, norvasc, outpatient.  BP 200's on admission. Due  to fluid overload. SOB 2/2 pulmonary edema in the setting of volume overload. No other symptoms at this time. Started on labetalol 300 BID - resume home meds - HD with fluid removal per nephro - supplemental O2 as needed  # ESRD MWF Dialysis started June 2023. Patient follows with [facility]. Right IJ for access, no fistula present. - HD per nephro. - strict I+Os - Daily weights  # Anemia of chronic disease Stable. No evidence of bleeding - continue to monitor. Transfuse as necessary - Aranesp/IV iron deferred to nephro  # Hx Cerebral infarction Recent admission 4/14 for subacute stroke. Had been on plavix and lipitor prior to admission. Now resides in SNF and is wheelchair bound. Does not appear to have any new symptoms.  - statin - aspirin - plavix  # HLD - statin  # Malnutrition Related to underlying CKD and hx stroke - PT/OT  # Hx Tobacco, marijuana, alcohol use Denies recent use   Diet: Heart Healthy VTE: Heparin IVF: None,None Code: Full Prior to Admission Living Arrangement: SNF Anticipated Discharge Location: SNF Barriers to Discharge: continued medical workup Dispo: Admit patient to Observation with expected length of stay less than 2  midnights.  Signed: Delene Ruffini, MD

## 2022-04-04 NOTE — ED Notes (Signed)
Rectal temp 97.9. Beir hugger taken off pt, warm blankets provided.

## 2022-04-04 NOTE — ED Notes (Signed)
Called [facility] to transport patient to [facility].

## 2022-04-04 NOTE — Hospital Course (Addendum)
9/5: Patient is feeling better today and is wondering when he is going home. Discussed that we are waiting on insurance authorization but that we are hoping to get him home today.   9/7: Patient is feeling well today and has no complaints. Patient sees podiatry as an outpatient. Discussed plan to discharge once his insurance authorization is processed.   9/11: Discussed that we are still waiting on insurance authorization for SNF.

## 2022-04-04 NOTE — Progress Notes (Signed)
Received patient in bed to unit.  Alert and oriented.  Informed consent signed and in chart.   Treatment initiated: 1244 Treatment completed: 1621  Patient tolerated well. But his catheter did not. Machine alarmed constantly w/ increased AP alarms getting to -490 suggesting that the catheter may be sucking up against the heart wall and may need to be pulled back. Pt also constantly raising his rt arm w/ kinked off his catheter in his neck causing constant alarming. When he finally fell asleep from the Ativan, it ran ok at 300 BFR and reversed.but when he woke back up, the constant alarming continued until he clotted off the 1st system. It was re set up and restarted and didn't last long before he clotted that system off too. Called MD to make him aware and see if I needed to re set him up as he had 55 min to go but he was about to lose his 2nd sytem. MD said ok to stop tx at that time. Transported back to the room  Alert, without acute distress.  Hand-off given to patient's nurse.   Access used: HD cath Access issues: MANY, see note above. His catheter placement needs to be assessed.   Total UF removed: 2900 Medication(s) given: Heparin 1100 units Bolus, Ativan 1mg  IVP, all 3 of his scheduled bp meds d/t his very high bp, TPA dwells to bilat ports Post HD VS: 97.8    160/107    93    34    100% on 5L [place] Post HD weight: UTA d/t being on ED stretcher that didn't weigh and pt can't stand to weigh   [facility]

## 2022-04-04 NOTE — ED Triage Notes (Signed)
Pt arrives via [facility] from [facility] for shortness of breath for a couple days, and Left quadrant abdominal pain.

## 2022-04-04 NOTE — Progress Notes (Signed)
NEW ADMISSION NOTE New Admission Note:   Arrival Method: stretcher Mental Orientation: AAOx4 Telemetry:5M20 Assessment: Completed Skin: See assessment IV: RFA 20 g Pain: 0/10 Tubes: n/a Safety Measures: Safety Fall Prevention Plan has been given, discussed and signed Admission: Completed [facility] [place] Orientation: Patient has been orientated to the room, unit and staff.  Family: none at bedside  Orders have been reviewed and implemented. Will continue to monitor the patient. Call light has been placed within reach and bed alarm has been activated.   Vira Agar, RN

## 2022-04-04 NOTE — ED Notes (Signed)
Tomi Bamberger MD at bedside. See new orders.

## 2022-04-04 NOTE — ED Notes (Signed)
Patient transported to Dialysis

## 2022-04-04 NOTE — ED Notes (Addendum)
Pt O2 dropped to 82% on room air. RN placed pt on 3L [place]. Pt O2 went up to 85%. Pt tachypnic, placed on 15L [place]. O2 up to 97%. Breath sounds diminished, with crackles at the base. Pt A&Ox4. Tomi Bamberger MD made aware.

## 2022-04-05 DIAGNOSIS — E8770 Fluid overload, unspecified: Secondary | ICD-10-CM | POA: Diagnosis not present

## 2022-04-05 DIAGNOSIS — Z992 Dependence on renal dialysis: Secondary | ICD-10-CM | POA: Diagnosis not present

## 2022-04-05 DIAGNOSIS — N186 End stage renal disease: Secondary | ICD-10-CM

## 2022-04-05 DIAGNOSIS — E873 Alkalosis: Secondary | ICD-10-CM

## 2022-04-05 DIAGNOSIS — E46 Unspecified protein-calorie malnutrition: Secondary | ICD-10-CM

## 2022-04-05 DIAGNOSIS — J9601 Acute respiratory failure with hypoxia: Secondary | ICD-10-CM | POA: Diagnosis not present

## 2022-04-05 DIAGNOSIS — J811 Chronic pulmonary edema: Secondary | ICD-10-CM

## 2022-04-05 DIAGNOSIS — I160 Hypertensive urgency: Secondary | ICD-10-CM

## 2022-04-05 DIAGNOSIS — I10 Essential (primary) hypertension: Secondary | ICD-10-CM

## 2022-04-05 DIAGNOSIS — E875 Hyperkalemia: Secondary | ICD-10-CM | POA: Insufficient documentation

## 2022-04-05 DIAGNOSIS — F17210 Nicotine dependence, cigarettes, uncomplicated: Secondary | ICD-10-CM

## 2022-04-05 LAB — BASIC METABOLIC PANEL
Anion gap: 9 (ref 5–15)
BUN: 27 mg/dL — ABNORMAL HIGH (ref 6–20)
CO2: 27 mmol/L (ref 22–32)
Calcium: 8.9 mg/dL (ref 8.9–10.3)
Chloride: 102 mmol/L (ref 98–111)
Creatinine, Ser: 5.92 mg/dL — ABNORMAL HIGH (ref 0.61–1.24)
GFR, Estimated: 10 mL/min — ABNORMAL LOW (ref >60)
Glucose, Bld: 109 mg/dL — ABNORMAL HIGH (ref 70–99)
Potassium: 5 mmol/L (ref 3.5–5.1)
Sodium: 138 mmol/L (ref 135–145)

## 2022-04-05 LAB — CBC
HCT: 27.1 % — ABNORMAL LOW (ref 39.0–52.0)
Hemoglobin: 8.7 g/dL — ABNORMAL LOW (ref 13.0–17.0)
MCH: 29.9 pg (ref 26.0–34.0)
MCHC: 32.1 g/dL (ref 30.0–36.0)
MCV: 93.1 fL (ref 80.0–100.0)
Platelets: 156 10*3/uL (ref 150–400)
RBC: 2.91 MIL/uL — ABNORMAL LOW (ref 4.22–5.81)
RDW: 15.3 % (ref 11.5–15.5)
WBC: 7.5 10*3/uL (ref 4.0–10.5)
nRBC: 0 % (ref 0.0–0.2)

## 2022-04-05 MED ORDER — SEVELAMER CARBONATE 800 MG PO TABS
800.0000 mg | ORAL_TABLET | Freq: Three times a day (TID) | ORAL | Status: DC
  Administered 2022-04-05 – 2022-04-12 (×18): 800 mg via ORAL
  Filled 2022-04-05 (×18): qty 1

## 2022-04-05 MED ORDER — CHLORHEXIDINE GLUCONATE CLOTH 2 % EX PADS
6.0000 | MEDICATED_PAD | Freq: Every day | CUTANEOUS | Status: DC
  Administered 2022-04-06: 6 via TOPICAL

## 2022-04-05 MED ORDER — ACETAMINOPHEN 500 MG PO TABS
1000.0000 mg | ORAL_TABLET | Freq: Once | ORAL | Status: AC
  Administered 2022-04-05: 1000 mg via ORAL
  Filled 2022-04-05: qty 2

## 2022-04-05 MED ORDER — HEPARIN SODIUM (PORCINE) 1000 UNIT/ML IJ SOLN
INTRAMUSCULAR | Status: AC
  Administered 2022-04-05: 3000 [IU]
  Filled 2022-04-05: qty 4

## 2022-04-05 MED ORDER — HEPARIN SODIUM (PORCINE) 1000 UNIT/ML IJ SOLN
INTRAMUSCULAR | Status: AC
  Filled 2022-04-05: qty 3

## 2022-04-05 NOTE — Progress Notes (Addendum)
HD#0 SUBJECTIVE:  Patient Summary: Cody Macdonald is a 61 y.o. with pertinent PMH of ESRD on HD who presented with SOB and admitted for volume overload  Overnight Events: no acute overnight events    Interm History:  Patient feeling improved this morning. Complains of discomfort related to position. Wants to get back in bed. No other complaints at this time.   OBJECTIVE:  Vital Signs: Vitals:   04/04/22 2149 04/05/22 0525 04/05/22 0841 04/05/22 0930  BP: (!) 151/93 (!) 147/92  (!) 141/72  Pulse: 81 79 78 78  Resp: 16 15  19   Temp: 98.4 F (36.9 C) 98.5 F (36.9 C)  98 F (36.7 C)  TempSrc:    Oral  SpO2: 99% 98% 98% 98%  Weight:      Height:       Supplemental O2:  SpO2: 98 % O2 Flow Rate (L/min): 3 L/min  Filed Weights   04/04/22 0508 04/04/22 1701  Weight: 54 kg 51.3 kg     Intake/Output Summary (Last 24 hours) at 04/05/2022 1326 Last data filed at 04/05/2022 0900 Gross per 24 hour  Intake 480 ml  Output 2900 ml  Net -2420 ml   Net IO Since Admission: -2,420 mL [04/05/22 1326]  Physical Exam: Constitutional: chronically ill and frail appearing male, resting in bed,  in no acute distress HENT: normocephalic atraumatic, mucous membranes moist Eyes: conjunctiva non-erythematous Neck: supple Cardiovascular: regular rate and rhythm, no m/r/g Pulmonary/Chest: normal work of breathing on room air, lungs clear to auscultation bilaterally Abdominal: soft, non-tender, non-distended MSK: diminished bulk, contracted Left side Neurological: alert & oriented to person and time, strength 3+-4/5 Right side, does not move LUE or LLL Skin: warm and dry Psych: mood and affect appear flat  Patient Lines/Drains/Airways Status     Active Line/Drains/Airways     Name Placement date Placement time Site Days   Peripheral IV 04/04/22 20 G 1" Posterior;Right Forearm 04/04/22  0450  Forearm  1   Hemodialysis Catheter Right Subclavian --  --  Subclavian  --             Pertinent Labs:    Latest Ref Rng & Units 04/05/2022    3:08 AM 04/04/2022   11:40 AM 04/04/2022    5:38 AM  CBC  WBC 4.0 - 10.5 K/uL 7.5  8.7    Hemoglobin 13.0 - 17.0 g/dL 8.7  11.7  12.2   Hematocrit 39.0 - 52.0 % 27.1  37.4  36.0   Platelets 150 - 400 K/uL 156  228         Latest Ref Rng & Units 04/05/2022    3:08 AM 04/04/2022   11:40 AM 04/04/2022    5:38 AM  CMP  Glucose 70 - 99 mg/dL 109     BUN 6 - 20 mg/dL 27     Creatinine 0.61 - 1.24 mg/dL 5.92  7.24    Sodium 135 - 145 mmol/L 138   141   Potassium 3.5 - 5.1 mmol/L 5.0   5.3   Chloride 98 - 111 mmol/L 102     CO2 22 - 32 mmol/L 27     Calcium 8.9 - 10.3 mg/dL 8.9       No results for input(s): "GLUCAP" in the last 72 hours.   Pertinent Imaging: No results found.  ASSESSMENT/PLAN:  Assessment: Principal Problem:   Volume overload  JACQUES FIFE is a 62 y.o. with pertinent PMH of ESRD on HD who  presented with SOB and admitted for volume overload on hospital day 0   # Acute hypoxic respiratory failure due to volume overload # respiratory alkylosis # Pulmonary edema Pulmonary edema due to volume overload in the setting of ESRD. Patient denies missed HD sessions. Received episode of HD yesterday with 3L removed.  He is of [place] and breathing comfortably on RA.  - Continued per nephro - respiratory status much improved today. He is receiving another session of HD today to get him back on schedule. Anticipate discharge back to [facility].   # Hypertensive urgency due to volume overload # HTN Takes hydralazine, labetalol, norvasc, outpatient.  BP better controlled s/p 3L fluid removal and initiation of outpatient BP regimen.  - continue home meds - HD per nephro - supplemental O2 as needed   # ESRD MWF Dialysis started June 2023. Patient follows with [facility]. Right IJ for access, no fistula present. Given volume overload, it is possible that his EDW has been overestimated.  Will need to continue to optimize volume.  - HD per nephro. - strict I+Os - Daily weights - renvela restarted for elevated phos   # Anemia of chronic disease Stable. No evidence of bleeding - continue to monitor. Transfuse as necessary - Aranesp/IV iron deferred to nephro   # Hx Cerebral infarction Recent admission 4/14 for subacute stroke. Had been on plavix and lipitor prior to admission. Now resides in SNF and is wheelchair bound. Does not appear to have any new symptoms.  - statin - aspirin - plavix   # HLD - statin   # Malnutrition Related to underlying CKD and hx stroke - PT/OT   # Hx Tobacco, marijuana, alcohol use Denies recent use     Diet: Heart Healthy VTE: Heparin IVF: None,None Code: Full Prior to Admission Living Arrangement: SNF Anticipated Discharge Location: SNF Barriers to Discharge: continued medical workup Dispo: Admit patient to Observation with expected length of stay less than 2 midnights.   Signed: Delene Ruffini, MD

## 2022-04-05 NOTE — Progress Notes (Signed)
PT Cancellation Note  Patient Details Name: BRAILON DON MRN: [id number] DOB: April 16, 1961   Cancelled Treatment:    Reason Eval/Treat Not Completed: Other (comment)  Patient recently back to bed with nursing (after getting very angry due to being uncomfortable in the chair). Currently sleeping. RN advised not to awaken pt. Will attempt later today.    [place]  Office [phone]  Rexanne Mano 04/05/2022, 11:49 AM

## 2022-04-05 NOTE — Plan of Care (Signed)
Seen and examined on dialysis earlier this evening.  Per HD RN his catheter has worked well - no issues.    Stable for discharge from a strictly renal standpoint   Disposition per primary team   Claudia Desanctis, MD 6:55 PM 04/05/2022

## 2022-04-05 NOTE — Progress Notes (Signed)
Pt c/o left sided pain at 8/10. No prn orders for pain. RN messaged MD on call to see if we can get an order for pain medication. Awaiting response.   Foster Simpson Gottfried Standish

## 2022-04-05 NOTE — Evaluation (Signed)
Occupational Therapy Evaluation Patient Details Name: Cody Macdonald MRN: [id number] DOB: February 05, 1961 Today's Date: 04/05/2022   History of Present Illness 61 year old male presented to ED with complaints of progressive shortness of breath over the past two weeks. Found to have acute hypoxic respiratory failure due to volume overload, Pulmonary edema. PHMx:HD MWF HTN,CKD, CVA, HTN, Etoh/tobacoo abuse, anemia, non-ambulatory.   Clinical Impression    This 61 yo male admitted with above presents to acute OT with PLOF of being Mod I with basic ADLs and transfers per his report. Currently he is set up/S-Mod A for basic ADLs and tranfers. He will continue to benefit from acute OT with followup at SNF.    Recommendations for follow up therapy are one component of a multi-disciplinary discharge planning process, led by the attending physician.  Recommendations may be updated based on patient status, additional functional criteria and insurance authorization.   Follow Up Recommendations  Skilled nursing-short term rehab (<3 hours/day)    Assistance Recommended at Discharge Frequent or constant Supervision/Assistance  Patient can return home with the following A lot of help with walking and/or transfers;A lot of help with bathing/dressing/bathroom;Assistance with cooking/housework;Assistance with feeding;Assist for transportation;Direct supervision/assist for financial management;Direct supervision/assist for medications management;Help with stairs or ramp for entrance    Functional Status Assessment  Patient has had a recent decline in their functional status and demonstrates the ability to make significant improvements in function in a reasonable and predictable amount of time.  Equipment Recommendations  None recommended by OT       Precautions / Restrictions Precautions Precautions: Fall Restrictions Weight Bearing Restrictions: No      Mobility Bed Mobility Overal bed mobility: Needs  Assistance Bed Mobility: Supine to Sit     Supine to sit: Min Macdonald, HOB elevated     General bed mobility comments: VCs for use of handrail    Transfers Overall transfer level: Needs assistance   Transfers: Bed to chair/wheelchair/BSC     Squat pivot transfers: Mod assist       General transfer comment: going to his right      Balance Overall balance assessment: Needs assistance Sitting-balance support: No upper extremity supported, Feet supported Sitting balance-Leahy Scale: Good     Standing balance support: Single extremity supported Standing balance-Leahy Scale: Poor                             ADL either performed or assessed with clinical judgement   ADL Overall ADL's : Needs assistance/impaired Eating/Feeding: Set up;Sitting Eating/Feeding Details (indicate cue type and reason): in recliner Grooming: Minimal assistance;Sitting Grooming Details (indicate cue type and reason): EOB Upper Body Bathing: Minimal assistance;Sitting Upper Body Bathing Details (indicate cue type and reason): EOB Lower Body Bathing: Minimal assistance Lower Body Bathing Details (indicate cue type and reason): Mod A sit<>stand Upper Body Dressing : Minimal assistance;Sitting Upper Body Dressing Details (indicate cue type and reason): EOB Lower Body Dressing: Moderate assistance Lower Body Dressing Details (indicate cue type and reason): Mod A sit<>stand Toilet Transfer: Moderate assistance;Squat-pivot Toilet Transfer Details (indicate cue type and reason): simulated bed>recliner going to his right side Toileting- Clothing Manipulation and Hygiene: Minimal assistance Toileting - Clothing Manipulation Details (indicate cue type and reason): Mod A sit<>stand        Pt wanting another breakfast to eat, called down to kitchen and he cannot have more to eat due to he is on a heart health diet.  Attempted to find him some heart healthy cereal on 4 different units or some  instant oatmeal per his request-none to be found. Ended up with PNB and graham crackers. Increased time taking care of this and other patient needs while I was in his room.     Vision Patient Visual Report: No change from baseline              Pertinent Vitals/Pain Pain Assessment Pain Assessment: Faces Faces Pain Scale: Hurts little more Pain Location: LUE and LLE when touched Pain Descriptors / Indicators: Sharp, Shooting Pain Intervention(s): Limited activity within patient's tolerance, Monitored during session     Hand Dominance Right   Extremity/Trunk Assessment Upper Extremity Assessment Upper Extremity Assessment: LUE deficits/detail LUE Deficits / Details: increased tone from prior CVA, would only let me touch/move it a little because of pain, reports he has/had a splint for his hand/wrist/forearm LUE Coordination: decreased fine motor;decreased gross motor           Communication Communication Communication:  (low tone of speech at times)   Cognition Arousal/Alertness: Awake/alert Behavior During Therapy: WFL for tasks assessed/performed Overall Cognitive Status: Impaired/Different from baseline Area of Impairment: Orientation                 Orientation Level: Place                              Home Living Family/patient expects to be discharged to:: Skilled nursing facility                                        Prior Functioning/Environment Prior Level of Function : Needs assist       Physical Assist : Mobility (physical);ADLs (physical) Mobility (physical): Bed mobility;Transfers ADLs (physical): Grooming;Bathing;Dressing;Toileting Mobility Comments: using wheelchair at SNF; transferring modified independent per pt ADLs Comments: min A        OT Problem List: Decreased strength;Decreased range of motion;Impaired balance (sitting and/or standing);Impaired UE functional use;Decreased cognition;Pain;Decreased  coordination      OT Treatment/Interventions: Self-care/ADL training;DME and/or AE instruction;Patient/family education;Balance training    OT Goals(Current goals can be found in the care plan section) Acute Rehab OT Goals Patient Stated Goal: wants more to eat OT Goal Formulation: With patient Time For Goal Achievement: 04/19/22 Potential to Achieve Goals: Good  OT Frequency: Min 2X/week       AM-PAC OT "6 Clicks" Daily Activity     Outcome Measure Help from another person eating meals?: A Little Help from another person taking care of personal grooming?: A Little Help from another person toileting, which includes using toliet, bedpan, or urinal?: A Lot Help from another person bathing (including washing, rinsing, drying)?: A Lot Help from another person to put on and taking off regular upper body clothing?: A Little Help from another person to put on and taking off regular lower body clothing?: A Lot 6 Click Score: 15   End of Session Nurse Communication: Mobility status (possible need for stedy with NT)  Activity Tolerance: Patient tolerated treatment well Patient left: in chair;with call bell/phone within reach;with chair alarm set  OT Visit Diagnosis: Unsteadiness on feet (R26.81);Other abnormalities of gait and mobility (R26.89);Muscle weakness (generalized) (M62.81);Pain;Hemiplegia and hemiparesis Hemiplegia - Right/Left: Left Hemiplegia - dominant/non-dominant: Non-Dominant Hemiplegia - caused by: Cerebral infarction Pain - Right/Left: Left Pain - part of  body: Arm;Leg                Time: 3013-1438 OT Time Calculation (min): 60 min Charges:  OT General Charges $OT Visit: 1 Visit OT Evaluation $OT Eval Moderate Complexity: 1 Mod OT Treatments $Self Care/Home Management : 38-52 mins  [place], OTR/L Acute Rehab Services [facility] [phone] Office [phone]    Almon Register 04/05/2022, 9:00 AM

## 2022-04-05 NOTE — Progress Notes (Signed)
[facility] Progress Note  Name: Cody Macdonald MRN: [id number] DOB: January 05, 1961   Subjective:  Last HD on 9/3 with 2.9 kg UF.  Nursing had several issues with his catheter and appears was worse when he was agitated.  He is frustrated by being off schedule for HD and I let him know that was because he got an urgent treatment yesterday.  He would like HD today and this will let us access his catheter so I have said is ok; spoke with HD charge RN  Review of systems:  States shortness of breath "better" but breathing "not great" Denies n/v  ------------- Background on consult:  Cody Macdonald is an 61 y.o. male with history of hypertension, HLD, ESRD on HD MWF at [facility] presents from [place] facility with shortness of breath for few days, seen as a consultation for the management of ESRD.  Patient reported that he had about 3 hours of dialysis on last Friday without any event.  He does not remember the amount of fluid taken.  In the ER, his blood pressure elevated to 223/125, hypoxic initially required nonrebreather and then on 4 L of oxygen.  The labs showed potassium level of 5.3, BUN 30, creatinine 6.8, hemoglobin of 12.2.  Chest x-ray with pulmonary edema but unable to rule out multifocal pneumonia.     Intake/Output Summary (Last 24 hours) at 04/05/2022 1123 Last data filed at 04/05/2022 0900 Gross per 24 hour  Intake 480 ml  Output 2900 ml  Net -2420 ml    Vitals:  Vitals:   04/04/22 2149 04/05/22 0525 04/05/22 0841 04/05/22 0930  BP: (!) 151/93 (!) 147/92  (!) 141/72  Pulse: 81 79 78 78  Resp: 16 15  19   Temp: 98.4 F (36.9 C) 98.5 F (36.9 C)  98 F (36.7 C)  TempSrc:    Oral  SpO2: 99% 98% 98% 98%  Weight:      Height:         Physical Exam:  General adult male in bed in no acute distress HEENT normocephalic atraumatic extraocular movements intact sclera anicteric Neck supple trachea midline Lungs clear to auscultation bilaterally  normal work of breathing at rest on room air  Heart S1S2 no rub Abdomen soft nontender nondistended Extremities no edema  Psych easily frustrated and anxious  Access RIJ tunn catheter in place   Medications reviewed   Labs:     Latest Ref Rng & Units 04/05/2022    3:08 AM 04/04/2022   11:40 AM 04/04/2022    5:38 AM  BMP  Glucose 70 - 99 mg/dL 109     BUN 6 - 20 mg/dL 27     Creatinine 0.61 - 1.24 mg/dL 5.92  7.24    Sodium 135 - 145 mmol/L 138   141   Potassium 3.5 - 5.1 mmol/L 5.0   5.3   Chloride 98 - 111 mmol/L 102     CO2 22 - 32 mmol/L 27     Calcium 8.9 - 10.3 mg/dL 8.9        Assessment/Plan:   #Acute hypoxic respiratory failure/acute pulmonary edema: The chest x-ray is unable to rule out multifocal pneumonia, the medical team is evaluating the patient.   # ESRD: MWF with last HD on 9/3 off schedule.  Per patient he goes to [facility].  He has right IJ for the access.    #Hypertensive urgency: With fluid overload. Optimize volume with HD  and continue current regimen    # Anemia of ESRD: Hb down most recently from prior.  He was previously above goal. Catheter issues with last HD.    # Metabolic Bone Disease: Monitor calcium and phosphorus level.  will need to obtain outpatient treatment record if patient remains in the hospital.  Disposition - HD today then per primary team    Claudia Desanctis, MD 04/05/2022 11:45 AM

## 2022-04-05 NOTE — TOC Progression Note (Signed)
Transition of Care [facility]) - Initial/Assessment Note    Patient Details  Name: JAMIESON HETLAND MRN: [id number] Date of Birth: August 04, 1960  Transition of [facility]) CM/SW Contact:    Milinda Antis, [place] Phone Number: 04/05/2022, 9:23 AM  Clinical Narrative:                 CSW contacted admissions at [facility] and was informed that the patient is a long term resident at the facility, but will need insurance authorization prior to returning to the facility.  TOC will continue to follow.         Patient Goals and CMS Choice        Expected Discharge Plan and Services                                                Prior Living Arrangements/Services                       Activities of Daily Living      Permission Sought/Granted                  Emotional Assessment              Admission diagnosis:  SOB (shortness of breath) [R06.02] Volume overload [E87.70] Hypervolemia associated with renal insufficiency [E87.70, N28.9] Patient Active Problem List   Diagnosis Date Noted   Volume overload 04/04/2022   Malnutrition of moderate degree 12/26/2021   Acute renal failure superimposed on stage 3b chronic kidney disease ([place]) 12/24/2021   Symptomatic anemia 12/24/2021   History of stroke 12/24/2021   Tobacco abuse 12/24/2021   Alcohol abuse 12/24/2021   Hypoalbuminemia 12/24/2021   AKI (acute kidney injury) ([place]) 12/24/2021   Abdominal distention 12/24/2021   Leg edema 12/24/2021   PCP:  Pcp, No Pharmacy:   [place] [place] [address] [place] [address] Phone: [phone] Fax: [phone]     Social Determinants of Health (SDOH) Interventions    Readmission Risk Interventions     No data to display

## 2022-04-05 NOTE — Evaluation (Signed)
Physical Therapy Evaluation Patient Details Name: Cody Macdonald MRN: [id number] DOB: 1961/02/06 Today's Date: 04/05/2022  History of Present Illness  61 year old male presented to ED with complaints of progressive shortness of breath over the past two weeks. Found to have acute hypoxic respiratory failure due to volume overload, Pulmonary edema. PHMx:HD MWF HTN,CKD, CVA, HTN, Etoh/tobacoo abuse, anemia, non-ambulatory.  Clinical Impression   Pt admitted secondary to problem above with deficits below. PTA patient was living in [place] and reports he was independent with transfers to/from his wheelchair. Reports independence with self-propulsion of w/c.  Pt currently requires up to mod assist for transfers. Patient has had a decline in function and can benefit from PT.  Anticipate patient will benefit from PT to address problems listed below.Will continue to follow acutely to maximize functional mobility independence and safety.          Recommendations for follow up therapy are one component of a multi-disciplinary discharge planning process, led by the attending physician.  Recommendations may be updated based on patient status, additional functional criteria and insurance authorization.  Follow Up Recommendations Skilled nursing-short term rehab (<3 hours/day) Can patient physically be transported by private vehicle: No    Assistance Recommended at Discharge Intermittent Supervision/Assistance  Patient can return home with the following  A lot of help with walking and/or transfers;Assistance with cooking/housework;Assist for transportation;Help with stairs or ramp for entrance    Equipment Recommendations None recommended by PT  Recommendations for Other Services       Functional Status Assessment Patient has had a recent decline in their functional status and demonstrates the ability to make significant improvements in function in a reasonable and predictable amount of time.      Precautions / Restrictions Precautions Precautions: Fall Restrictions Weight Bearing Restrictions: No      Mobility  Bed Mobility               General bed mobility comments: pt refused as dialysis on their way to get him (noted he was minguard with OT earlier today)    Transfers                   General transfer comment: pt refused due to dialysis coming to get him; noted he was mod assist with squat-pivot with OT earlier today    Ambulation/Gait                  Stairs            Wheelchair Mobility    Modified Rankin (Stroke Patients Only)       Balance                                             Pertinent Vitals/Pain Pain Assessment Pain Assessment: Faces Faces Pain Scale: Hurts little more Pain Location: LUE when touched Pain Descriptors / Indicators: Sharp, Shooting Pain Intervention(s): Limited activity within patient's tolerance, Monitored during session, Repositioned    Home Living Family/patient expects to be discharged to:: Skilled nursing facility                   Additional Comments: patient's long-term goal is to return home    Prior Function Prior Level of Function : Needs assist       Physical Assist : ADLs (physical) Mobility (physical): Bed mobility;Transfers ADLs (physical): Grooming;Bathing;Dressing;Toileting Mobility  Comments: using wheelchair independently at SNF; transferring modified independent per pt (describes squat-pivot) ADLs Comments: min A     Hand Dominance   Dominant Hand: Right    Extremity/Trunk Assessment   Upper Extremity Assessment Upper Extremity Assessment: Defer to OT evaluation    Lower Extremity Assessment Lower Extremity Assessment: RLE deficits/detail;LLE deficits/detail RLE Deficits / Details: AAROM WFL; hip flex 2+, extension 2+; knee extension 2+; ankle 2+ LLE Deficits / Details: PROM limited due to pain when flexed due to pushing against his  LUE which is painful (assessed in supine); hip flexion 0/5, extension 1+, ankle DF 2+       Communication   Communication: No difficulties  Cognition Arousal/Alertness: Awake/alert Behavior During Therapy: WFL for tasks assessed/performed Overall Cognitive Status: Impaired/Different from baseline Area of Impairment: Orientation                 Orientation Level: Place                      General Comments General comments (skin integrity, edema, etc.): Discussed prior functional level and pt's goals and if willing to work with therapy (as he was self-limiting today in anticipation of dialysis). He reports he is eager to get his strength and independence back.    Exercises     Assessment/Plan    PT Assessment Patient needs continued PT services  PT Problem List Decreased strength;Decreased balance;Decreased mobility;Pain       PT Treatment Interventions DME instruction;Functional mobility training;Therapeutic activities;Therapeutic exercise;Balance training;Neuromuscular re-education;Patient/family education;Wheelchair mobility training    PT Goals (Current goals can be found in the Care Plan section)  Acute Rehab PT Goals Patient Stated Goal: regain strength and independence with wheelchair PT Goal Formulation: With patient Time For Goal Achievement: 04/19/22 Potential to Achieve Goals: Good    Frequency Min 2X/week     Co-evaluation               AM-PAC PT "6 Clicks" Mobility  Outcome Measure Help needed turning from your back to your side while in a flat bed without using bedrails?: A Little Help needed moving from lying on your back to sitting on the side of a flat bed without using bedrails?: A Little Help needed moving to and from a bed to a chair (including a wheelchair)?: A Lot Help needed standing up from a chair using your arms (e.g., wheelchair or bedside chair)?: Total Help needed to walk in hospital room?: Total Help needed climbing  3-5 steps with a railing? : Total 6 Click Score: 11    End of Session   Activity Tolerance: Patient tolerated treatment well Patient left: in bed;with call bell/phone within reach   PT Visit Diagnosis: Muscle weakness (generalized) (M62.81);Other symptoms and signs involving the nervous system (R29.898)    Time: 0370-4888 PT Time Calculation (min) (ACUTE ONLY): 9 min   Charges:   PT Evaluation $PT Eval Low Complexity: [place], PT Acute Rehabilitation Services  Office [phone]   Rexanne Mano 04/05/2022, 1:25 PM

## 2022-04-06 DIAGNOSIS — I120 Hypertensive chronic kidney disease with stage 5 chronic kidney disease or end stage renal disease: Secondary | ICD-10-CM | POA: Diagnosis not present

## 2022-04-06 DIAGNOSIS — Z992 Dependence on renal dialysis: Secondary | ICD-10-CM | POA: Diagnosis not present

## 2022-04-06 DIAGNOSIS — N186 End stage renal disease: Secondary | ICD-10-CM | POA: Diagnosis not present

## 2022-04-06 DIAGNOSIS — F17210 Nicotine dependence, cigarettes, uncomplicated: Secondary | ICD-10-CM | POA: Diagnosis not present

## 2022-04-06 LAB — RENAL FUNCTION PANEL
Albumin: 2.6 g/dL — ABNORMAL LOW (ref 3.5–5.0)
Anion gap: 9 (ref 5–15)
BUN: 19 mg/dL (ref 6–20)
CO2: 30 mmol/L (ref 22–32)
Calcium: 8.9 mg/dL (ref 8.9–10.3)
Chloride: 99 mmol/L (ref 98–111)
Creatinine, Ser: 3.99 mg/dL — ABNORMAL HIGH (ref 0.61–1.24)
GFR, Estimated: 16 mL/min — ABNORMAL LOW (ref >60)
Glucose, Bld: 105 mg/dL — ABNORMAL HIGH (ref 70–99)
Phosphorus: 3.4 mg/dL (ref 2.5–4.6)
Potassium: 4.2 mmol/L (ref 3.5–5.1)
Sodium: 138 mmol/L (ref 135–145)

## 2022-04-06 MED ORDER — CHLORHEXIDINE GLUCONATE CLOTH 2 % EX PADS
6.0000 | MEDICATED_PAD | Freq: Every day | CUTANEOUS | Status: DC
  Administered 2022-04-07 – 2022-04-11 (×4): 6 via TOPICAL

## 2022-04-06 NOTE — Progress Notes (Signed)
Pt receives out-pt HD at [facility] on MWF. Pt has an 11:15 chair time. Clinic aware pt is hospitalized and navigator will advise clinic of d/c date once known. Contacted by attending with request for 1st shift HD tomorrow in the event snf auth received. Contacted inpt HD unit and spoke to staff regarding 1st shift request. Will assist as needed.   Melven Sartorius Renal Navigator [phone]

## 2022-04-06 NOTE — NC FL2 (Signed)
[place] LEVEL OF CARE SCREENING TOOL     IDENTIFICATION  Patient Name: Cody Macdonald Birthdate: 1961-07-31 Sex: male Admission Date (Current Location): 04/04/2022  [place] and [place] Number:  Cody Macdonald [id number] Facility and Address:  [facility] [place] [place] [address] [place], [place] [address]      Provider Number: [id number]  Attending Physician Name and Address:  Sid Falcon, MD  Relative Name and Phone Number:  MATHEWS,WILHELMINA   [phone]    Current Level of Care: Hospital Recommended Level of Care: [place] Prior Approval Number:    Date Approved/Denied:   PASRR Number: [id number]  Discharge Plan: SNF    Current Diagnoses: Patient Active Problem List   Diagnosis Date Noted   Hypertension 04/05/2022   Hyperkalemia 04/05/2022   Pulmonary edema 04/05/2022   Volume overload 04/04/2022   Malnutrition of moderate degree 12/26/2021   ESRD (end stage renal disease) on dialysis ([place]) 12/24/2021   Symptomatic anemia 12/24/2021   History of stroke 12/24/2021   Tobacco abuse 12/24/2021   Alcohol abuse 12/24/2021   Hypoalbuminemia 12/24/2021   AKI (acute kidney injury) ([place]) 12/24/2021   Abdominal distention 12/24/2021   Leg edema 12/24/2021    Orientation RESPIRATION BLADDER Height & Weight     Self, Time, Situation, Place  Normal Continent Weight: 109 lb 12.6 oz (49.8 kg) Height:  5\' 8"  (172.7 cm)  BEHAVIORAL SYMPTOMS/MOOD NEUROLOGICAL BOWEL NUTRITION STATUS      Continent Diet (see d/c summary)  AMBULATORY STATUS COMMUNICATION OF NEEDS Skin   Extensive Assist Verbally Normal                       Personal Care Assistance Level of Assistance  Bathing, Feeding, Dressing Bathing Assistance: Limited assistance Feeding assistance: Independent Dressing Assistance: Limited assistance     Functional Limitations Info  Hearing, Speech, Sight Sight Info: Adequate Hearing Info: Adequate Speech  Info: Adequate    SPECIAL CARE FACTORS FREQUENCY  PT (By licensed PT), OT (By licensed OT)     PT Frequency: 5x/ week OT Frequency: 5x/ week            Contractures      Additional Factors Info  Code Status, Allergies Code Status Info: Full Allergies Info: NKA           Current Medications (04/06/2022):  This is the current hospital active medication list Current Facility-Administered Medications  Medication Dose Route Frequency Provider Last Rate Last Admin   albuterol (PROVENTIL) (2.5 MG/3ML) 0.083% nebulizer solution 2.5 mg  2.5 mg Nebulization Q6H PRN Delene Ruffini, MD       amLODipine (NORVASC) tablet 10 mg  10 mg Oral Daily Delene Ruffini, MD   10 mg at 04/05/22 0827   aspirin EC tablet 81 mg  81 mg Oral Daily Delene Ruffini, MD   81 mg at 04/05/22 0827   atorvastatin (LIPITOR) tablet 80 mg  80 mg Oral QHS Delene Ruffini, MD   80 mg at 04/05/22 2206   Chlorhexidine Gluconate Cloth 2 % PADS 6 each  6 each Topical Q0600 Claudia Desanctis, MD   6 each at 04/06/22 [phone]   clopidogrel (PLAVIX) tablet 75 mg  75 mg Oral Daily Delene Ruffini, MD   75 mg at 04/05/22 0827   heparin injection 5,000 Units  5,000 Units Subcutaneous Q8H Delene Ruffini, MD   5,000 Units at 04/06/22 4081   hydrALAZINE (APRESOLINE) tablet 50 mg  50 mg Oral  Q8H Delene Ruffini, MD   50 mg at 04/06/22 5110   hydrOXYzine (ATARAX) tablet 10 mg  10 mg Oral Once Delene Ruffini, MD       labetalol (NORMODYNE) tablet 300 mg  300 mg Oral BID Delene Ruffini, MD   300 mg at 04/05/22 2206   sevelamer carbonate (RENVELA) tablet 800 mg  800 mg Oral TID WC Iona Beard, MD   800 mg at 04/05/22 1150   Vitamin D (Ergocalciferol) (DRISDOL) 1.25 MG (50000 UNIT) capsule 50,000 Units  50,000 Units Oral Q Tawnya Crook, MD         Discharge Medications: Please see discharge summary for a list of discharge medications.  Relevant Imaging Results:  Relevant Lab Results:   Additional  Information SSn: [id number].  dialysis  [employer], LCSWA

## 2022-04-06 NOTE — TOC Progression Note (Signed)
Transition of Care [facility]) - Initial/Assessment Note    Patient Details  Name: Cody Macdonald MRN: [id number] Date of Birth: July 10, 1961  Transition of Care [facility]) CM/SW Contact:    Milinda Antis, [place] Phone Number: 04/06/2022, 9:57 AM  Clinical Narrative:                 CSW received notice from the facility that they will begin the insurance authorization process today.  TOC will continue to follow.         Patient Goals and CMS Choice        Expected Discharge Plan and Services                                                Prior Living Arrangements/Services                       Activities of Daily Living      Permission Sought/Granted                  Emotional Assessment              Admission diagnosis:  SOB (shortness of breath) [R06.02] Volume overload [E87.70] Hypervolemia associated with renal insufficiency [E87.70, N28.9] Patient Active Problem List   Diagnosis Date Noted   Hypertension 04/05/2022   Hyperkalemia 04/05/2022   Pulmonary edema 04/05/2022   Volume overload 04/04/2022   Malnutrition of moderate degree 12/26/2021   ESRD (end stage renal disease) on dialysis ([place]) 12/24/2021   Symptomatic anemia 12/24/2021   History of stroke 12/24/2021   Tobacco abuse 12/24/2021   Alcohol abuse 12/24/2021   Hypoalbuminemia 12/24/2021   AKI (acute kidney injury) ([place]) 12/24/2021   Abdominal distention 12/24/2021   Leg edema 12/24/2021   PCP:  Pcp, No Pharmacy:   [place], [place] [address] [place] [address] Phone: [phone] Fax: [phone]     Social Determinants of Health (SDOH) Interventions    Readmission Risk Interventions     No data to display

## 2022-04-06 NOTE — Progress Notes (Addendum)
HD#0 SUBJECTIVE:  Patient Summary: Cody Macdonald is a 61 y.o. with pertinent PMH of ESRD on HD who presented with SOB and admitted for volume overload  Overnight Events: no acute overnight events    Interm History:  Patient feeling improved this morning. Feels well. Breathing without difficulty. Eager to be discharged.   OBJECTIVE:  Vital Signs: Vitals:   04/06/22 0500 04/06/22 0607 04/06/22 0931 04/06/22 1308  BP:  (!) 148/84 (!) 142/76 138/77  Pulse:  73 78   Resp:   18   Temp:  98.7 F (37.1 C) 98.2 F (36.8 C)   TempSrc:  Oral    SpO2:  98% 99%   Weight: 49.8 kg     Height:       Supplemental O2:  SpO2: 99 % O2 Flow Rate (L/min): 3 L/min  Filed Weights   04/05/22 1450 04/05/22 1819 04/06/22 0500  Weight: 50.1 kg 49.8 kg 49.8 kg     Intake/Output Summary (Last 24 hours) at 04/06/2022 1316 Last data filed at 04/06/2022 1316 Gross per 24 hour  Intake 960 ml  Output 2 ml  Net 958 ml    Net IO Since Admission: -1,162 mL [04/06/22 1316]  Physical Exam: Constitutional: chronically frail appearing male, resting in bed,  in no acute distress HENT: normocephalic atraumatic, mucous membranes moist Eyes: conjunctiva non-erythematous Neck: supple Cardiovascular: regular rate and rhythm, no m/r/g Pulmonary/Chest: normal work of breathing on room air, lungs clear to auscultation bilaterally Abdominal: soft, non-tender, non-distended MSK: diminished bulk, contracted Left side Neurological: alert & oriented to person and time, strength 3+-4/5 Right side, does not move LUE or LLL Skin: warm and dry Psych: mood and affect appear flat  Patient Lines/Drains/Airways Status     Active Line/Drains/Airways     Name Placement date Placement time Site Days   Peripheral IV 04/04/22 20 G 1" Posterior;Right Forearm 04/04/22  0450  Forearm  1   Hemodialysis Catheter Right Subclavian --  --  Subclavian  --            Pertinent Labs:    Latest Ref Rng & Units  04/05/2022    3:08 AM 04/04/2022   11:40 AM 04/04/2022    5:38 AM  CBC  WBC 4.0 - 10.5 K/uL 7.5  8.7    Hemoglobin 13.0 - 17.0 g/dL 8.7  11.7  12.2   Hematocrit 39.0 - 52.0 % 27.1  37.4  36.0   Platelets 150 - 400 K/uL 156  228         Latest Ref Rng & Units 04/06/2022    2:27 AM 04/05/2022    3:08 AM 04/04/2022   11:40 AM  CMP  Glucose 70 - 99 mg/dL 105  109    BUN 6 - 20 mg/dL 19  27    Creatinine 0.61 - 1.24 mg/dL 3.99  5.92  7.24   Sodium 135 - 145 mmol/L 138  138    Potassium 3.5 - 5.1 mmol/L 4.2  5.0    Chloride 98 - 111 mmol/L 99  102    CO2 22 - 32 mmol/L 30  27    Calcium 8.9 - 10.3 mg/dL 8.9  8.9      No results for input(s): "GLUCAP" in the last 72 hours.   Pertinent Imaging: No results found.  ASSESSMENT/PLAN:  Assessment: Principal Problem:   ESRD (end stage renal disease) on dialysis [facility]) Active Problems:   Volume overload   Hypertension   Pulmonary edema  Cody Macdonald is a 61 y.o. with pertinent PMH of ESRD on HD who presented with SOB and admitted for volume overload on hospital day 0   # Acute hypoxic respiratory failure due to volume overload - resolved # respiratory alkylosis # Pulmonary edema Pulmonary edema due to volume overload in the setting of ESRD. Patient denies missed HD sessions. Initial HD session with 3L removed. Had session yesterday to get patient back on normal schedule.  Respiratory failure has resolved at this point. He is breathing comfortably on RA and stable for discharge, pending insurance authorization. Hopeful discharge back to [facility] today/early tomorrow pending insurance.    # Hypertensive urgency due to volume overload - resolved # HTN Takes hydralazine, labetalol, norvasc, outpatient.  BP better controlled s/p 3L fluid removal and initiation of outpatient BP regimen.  - continue home meds - HD per nephro  # ESRD MWF Dialysis started June 2023. Patient follows with [facility]. Right IJ for  access, no fistula present. Given volume overload, it is possible that his EDW has been overestimated. Will need to continue to optimize volume.  Will try to organize for early HD tomorrow to allow adequate time for discharge and transfer back to SNF. - HD per nephro. - strict I+Os - Daily weights - Continue Renvela   # Anemia of chronic disease Stable. No evidence of bleeding - continue to monitor. Transfuse as necessary - Aranesp/IV iron deferred to nephro   # Hx Cerebral infarction Recent admission 4/14 for subacute stroke. Had been on plavix and lipitor prior to admission. Now resides in SNF and is wheelchair bound. Does not appear to have any new symptoms.  - statin - aspirin - plavix   # HLD - statin   # Malnutrition Related to underlying CKD and hx stroke   # Hx Tobacco, marijuana, alcohol use Denies recent use     Diet: Heart Healthy VTE: Heparin IVF: None,None Code: Full Prior to Admission Living Arrangement: SNF Anticipated Discharge Location: SNF Barriers to Discharge: continued medical workup Dispo: Admit   Signed: Delene Ruffini, MD

## 2022-04-07 DIAGNOSIS — N186 End stage renal disease: Secondary | ICD-10-CM | POA: Diagnosis not present

## 2022-04-07 DIAGNOSIS — Z992 Dependence on renal dialysis: Secondary | ICD-10-CM | POA: Diagnosis not present

## 2022-04-07 LAB — RENAL FUNCTION PANEL
Albumin: 2.5 g/dL — ABNORMAL LOW (ref 3.5–5.0)
Anion gap: 10 (ref 5–15)
BUN: 36 mg/dL — ABNORMAL HIGH (ref 6–20)
CO2: 28 mmol/L (ref 22–32)
Calcium: 9.1 mg/dL (ref 8.9–10.3)
Chloride: 99 mmol/L (ref 98–111)
Creatinine, Ser: 6.22 mg/dL — ABNORMAL HIGH (ref 0.61–1.24)
GFR, Estimated: 10 mL/min — ABNORMAL LOW (ref >60)
Glucose, Bld: 80 mg/dL (ref 70–99)
Phosphorus: 4.5 mg/dL (ref 2.5–4.6)
Potassium: 5.8 mmol/L — ABNORMAL HIGH (ref 3.5–5.1)
Sodium: 137 mmol/L (ref 135–145)

## 2022-04-07 LAB — CBC
HCT: 26.7 % — ABNORMAL LOW (ref 39.0–52.0)
Hemoglobin: 8.3 g/dL — ABNORMAL LOW (ref 13.0–17.0)
MCH: 29.5 pg (ref 26.0–34.0)
MCHC: 31.1 g/dL (ref 30.0–36.0)
MCV: 95 fL (ref 80.0–100.0)
Platelets: 174 10*3/uL (ref 150–400)
RBC: 2.81 MIL/uL — ABNORMAL LOW (ref 4.22–5.81)
RDW: 14.6 % (ref 11.5–15.5)
WBC: 6.7 10*3/uL (ref 4.0–10.5)
nRBC: 0 % (ref 0.0–0.2)

## 2022-04-07 LAB — HEPATITIS B SURFACE ANTIBODY, QUANTITATIVE: Hep B S AB Quant (Post): 29.8 m[IU]/mL (ref >9.9)

## 2022-04-07 MED ORDER — ANTICOAGULANT SODIUM CITRATE 4% (200MG/5ML) IV SOLN: 5.0000 mL | Status: DC | PRN

## 2022-04-07 MED ORDER — ALTEPLASE 2 MG IJ SOLR: 2.0000 mg | Freq: Once | INTRAMUSCULAR | Status: DC | PRN

## 2022-04-07 MED ORDER — SODIUM ZIRCONIUM CYCLOSILICATE 10 G PO PACK
10.0000 g | PACK | Freq: Once | ORAL | Status: AC
  Administered 2022-04-07: 10 g via ORAL
  Filled 2022-04-07: qty 1

## 2022-04-07 MED ORDER — HEPARIN SODIUM (PORCINE) 1000 UNIT/ML DIALYSIS
1000.0000 [IU] | INTRAMUSCULAR | Status: DC | PRN
  Administered 2022-04-07: 1000 [IU]
  Filled 2022-04-07: qty 1

## 2022-04-07 NOTE — Progress Notes (Signed)
[facility] Progress Note  Name: Cody Macdonald MRN: [id number] DOB: 12/20/1960   Subjective:  Still here; he is obs.  Awaiting acceptance back at SNF. Seen on HD.  Last HD on 9/4 with 2 kg UF - catheter worked well. Hyperkalemia and has not been on a renal diet.  He thinks he's going back to the SNF. 124/74 on HD and HR 71.  RIJ tunn catheter in use.   Review of systems:    Breathing ok Denies n/v  ------------- Background on consult:  Cody Macdonald is an 61 y.o. male with history of hypertension, HLD, ESRD on HD MWF at [facility] presents from [facility] facility with shortness of breath for few days, seen as a consultation for the management of ESRD.  Patient reported that he had about 3 hours of dialysis on last Friday without any event.  He does not remember the amount of fluid taken.  In the ER, his blood pressure elevated to 223/125, hypoxic initially required nonrebreather and then on 4 L of oxygen.  The labs showed potassium level of 5.3, BUN 30, creatinine 6.8, hemoglobin of 12.2.  Chest x-ray with pulmonary edema but unable to rule out multifocal pneumonia.     Intake/Output Summary (Last 24 hours) at 04/07/2022 1024 Last data filed at 04/06/2022 1800 Gross per 24 hour  Intake 720 ml  Output --  Net 720 ml    Vitals:  Vitals:   04/07/22 0859 04/07/22 0927 04/07/22 0958 04/07/22 1017  BP: 139/76 127/72 113/79 101/75  Pulse: 69 71 72 72  Resp: (!) 0 19 18 19   Temp:      TempSrc:      SpO2: 99% 99% 100% 100%  Weight:      Height:         Physical Exam:    General adult male in bed in no acute distress HEENT normocephalic atraumatic extraocular movements intact sclera anicteric Neck supple trachea midline Lungs clear to auscultation bilaterally normal work of breathing at rest on room air  Heart S1S2 no rub Abdomen soft nontender nondistended Extremities no edema  Psych easily frustrated and anxious  Access RIJ tunn catheter in  place   Medications reviewed   Labs:     Latest Ref Rng & Units 04/07/2022    8:07 AM 04/06/2022    2:27 AM 04/05/2022    3:08 AM  BMP  Glucose 70 - 99 mg/dL 80  105  109   BUN 6 - 20 mg/dL 36  19  27   Creatinine 0.61 - 1.24 mg/dL 6.22  3.99  5.92   Sodium 135 - 145 mmol/L 137  138  138   Potassium 3.5 - 5.1 mmol/L 5.8  4.2  5.0   Chloride 98 - 111 mmol/L 99  99  102   CO2 22 - 32 mmol/L 28  30  27    Calcium 8.9 - 10.3 mg/dL 9.1  8.9  8.9      Assessment/Plan:   #Acute hypoxic respiratory failure/acute pulmonary edema: per primary team. Optimize volume with HD   # ESRD: MWF with last HD on 9/3 off schedule.  Per patient he goes to [facility].  He has right IJ for access.  Added renal diet restriction. Lokelma once today   #Hypertensive urgency: With fluid overload. Optimize volume with HD and continue current regimen    # Anemia of ESRD: anticipate resuming ESA as outpatient and last dosing is  unclear    # Metabolic Bone Disease: calcium and phos at goal.    Disposition - HD today.  Stable for discharge from a strictly renal standpoint   Claudia Desanctis, MD 04/07/2022 10:24 AM

## 2022-04-07 NOTE — Progress Notes (Signed)
PT Cancellation Note  Patient Details Name: Cody Macdonald MRN: [id number] DOB: January 17, 1961   Cancelled Treatment:    Reason Eval/Treat Not Completed: Patient at procedure or test/unavailable  Patient in HD.    [place]  Office [phone]  Rexanne Mano 04/07/2022, 8:45 AM

## 2022-04-07 NOTE — Progress Notes (Addendum)
HD#0 SUBJECTIVE:  Patient Summary: Cody Macdonald is a 61 y.o. with pertinent PMH of ESRD on HD who presented with SOB and admitted for volume overload  Overnight Events: no acute overnight events    Interm History:  Patient seen in HD bed today. Resting comfortably. No complaints. Asking about completing Hd prior to dc.   OBJECTIVE:  Vital Signs: Vitals:   04/07/22 1030 04/07/22 1059 04/07/22 1205 04/07/22 1357  BP: 124/74 120/74 (!) 154/84 (!) 140/78  Pulse: 67 69 69   Resp: (!) 23 14 19    Temp:   98.3 F (36.8 C)   TempSrc:      SpO2: 100% 100% 100%   Weight:      Height:       Supplemental O2:  SpO2: 100 % O2 Flow Rate (L/min): 3 L/min  Filed Weights   04/05/22 1819 04/06/22 0500 04/07/22 0744  Weight: 49.8 kg 49.8 kg 48.3 kg     Intake/Output Summary (Last 24 hours) at 04/07/2022 1555 Last data filed at 04/07/2022 1400 Gross per 24 hour  Intake 600 ml  Output 0 ml  Net 600 ml   Net IO Since Admission: -562 mL [04/07/22 1555]  Physical Exam: Constitutional: chronically frail appearing male, resting in bed,  in no acute distress HENT: normocephalic atraumatic, mucous membranes moist Eyes: conjunctiva non-erythematous Cardiovascular: regular rate and rhythm, no m/r/g Pulmonary/Chest: normal work of breathing on room air, lungs clear to auscultation bilaterally Abdominal: soft, non-tender, non-distended MSK: diminished bulk, contracted Left side Neurological: alert & oriented to person and time Skin: warm and dry Psych: mood and affect appear flat  Patient Lines/Drains/Airways Status     Active Line/Drains/Airways     Name Placement date Placement time Site Days   Peripheral IV 04/04/22 20 G 1" Posterior;Right Forearm 04/04/22  0450  Forearm  1   Hemodialysis Catheter Right Subclavian --  --  Subclavian  --            Pertinent Labs:    Latest Ref Rng & Units 04/07/2022    8:06 AM 04/05/2022    3:08 AM 04/04/2022   11:40 AM  CBC  WBC 4.0 -  10.5 K/uL 6.7  7.5  8.7   Hemoglobin 13.0 - 17.0 g/dL 8.3  8.7  11.7   Hematocrit 39.0 - 52.0 % 26.7  27.1  37.4   Platelets 150 - 400 K/uL 174  156  228        Latest Ref Rng & Units 04/07/2022    8:07 AM 04/06/2022    2:27 AM 04/05/2022    3:08 AM  CMP  Glucose 70 - 99 mg/dL 80  105  109   BUN 6 - 20 mg/dL 36  19  27   Creatinine 0.61 - 1.24 mg/dL 6.22  3.99  5.92   Sodium 135 - 145 mmol/L 137  138  138   Potassium 3.5 - 5.1 mmol/L 5.8  4.2  5.0   Chloride 98 - 111 mmol/L 99  99  102   CO2 22 - 32 mmol/L 28  30  27    Calcium 8.9 - 10.3 mg/dL 9.1  8.9  8.9     No results for input(s): "GLUCAP" in the last 72 hours.   Pertinent Imaging: No results found.  ASSESSMENT/PLAN:  Assessment: Principal Problem:   ESRD (end stage renal disease) on dialysis ([facility]) Active Problems:   Volume overload   Hypertension   Pulmonary edema  Cody Jewels  Macdonald is a 61 y.o. with pertinent PMH of ESRD on HD who presented with SOB and admitted for volume overload on hospital day 0   # Acute hypoxic respiratory failure due to volume overload - resolved # respiratory alkylosis # Pulmonary edema Pulmonary edema due to volume overload in the setting of ESRD. No missed HD sessions. Had 3L removed for volume overload.   Respiratory failure has resolved at this point. He is breathing comfortably on RA and stable for discharge, pending insurance authorization. Hopeful discharge back to [facility] as soon as insurance is authorized.    # Hypertensive urgency due to volume overload - resolved # HTN Takes hydralazine, labetalol, norvasc, outpatient.  BP better controlled s/p 3L fluid removal and initiation of outpatient BP regimen.  - continue home meds - HD per nephro  # ESRD MWF Dialysis started June 2023. Patient follows with [facility]. Right IJ for access, no fistula present. Given volume overload, it is possible that his EDW has been overestimated. Will need to continue to  optimize volume.  - HD per nephro. - strict I+Os - Daily weights - Continue Renvela   # Anemia of chronic disease Stable. No evidence of bleeding - continue to monitor. Transfuse as necessary - Aranesp/IV iron deferred to nephro   # Hx Cerebral infarction Recent admission 4/14 for subacute stroke. Had been on plavix and lipitor prior to admission. Now resides in SNF and is wheelchair bound. Does not appear to have any new symptoms.  - statin - aspirin - plavix   # HLD - statin   # Malnutrition Related to underlying CKD and hx stroke   # Hx Tobacco, marijuana, alcohol use Denies recent use     Diet: Heart Healthy VTE: Heparin IVF: None,None Code: Full Prior to Admission Living Arrangement: SNF Anticipated Discharge Location: SNF Barriers to Discharge: insurance authorization  Dispo: discharge to SNF once insurance has been authorized   Signed: Delene Ruffini, MD

## 2022-04-07 NOTE — TOC Progression Note (Addendum)
Transition of Care [facility]) - Initial/Assessment Note    Patient Details  Name: MEARL OLVER MRN: [id number] Date of Birth: 1961/01/26  Transition of Care [facility]) CM/SW Contact:    Milinda Antis, [place] Phone Number: 04/07/2022, 8:58 AM  Clinical Narrative:                 08:55-  CSW contacted the facility and inquired about insurance auth.  The Josem Kaufmann is still pending.  15:20-  CSW contacted facility to inquire about insurance auth.  Insurance Josem Kaufmann is still pending.    16:15-  CSW contacted facility to inquire about the status of insurance auth.  Insurance Josem Kaufmann is still pending  CSW will continue to follow.          Patient Goals and CMS Choice        Expected Discharge Plan and Services                                                Prior Living Arrangements/Services                       Activities of Daily Living      Permission Sought/Granted                  Emotional Assessment              Admission diagnosis:  SOB (shortness of breath) [R06.02] Volume overload [E87.70] Hypervolemia associated with renal insufficiency [E87.70, N28.9] Patient Active Problem List   Diagnosis Date Noted   Hypertension 04/05/2022   Hyperkalemia 04/05/2022   Pulmonary edema 04/05/2022   Volume overload 04/04/2022   Malnutrition of moderate degree 12/26/2021   ESRD (end stage renal disease) on dialysis ([place]) 12/24/2021   Symptomatic anemia 12/24/2021   History of stroke 12/24/2021   Tobacco abuse 12/24/2021   Alcohol abuse 12/24/2021   Hypoalbuminemia 12/24/2021   AKI (acute kidney injury) ([place]) 12/24/2021   Abdominal distention 12/24/2021   Leg edema 12/24/2021   PCP:  Pcp, No Pharmacy:   [place], [place] [address] [place] [address] Phone: [phone] Fax: [phone]     Social Determinants of Health (SDOH) Interventions    Readmission Risk Interventions     No  data to display

## 2022-04-07 NOTE — Plan of Care (Signed)

## 2022-04-08 DIAGNOSIS — Z992 Dependence on renal dialysis: Secondary | ICD-10-CM | POA: Diagnosis not present

## 2022-04-08 DIAGNOSIS — N186 End stage renal disease: Secondary | ICD-10-CM | POA: Diagnosis not present

## 2022-04-08 LAB — RENAL FUNCTION PANEL
Albumin: 2.6 g/dL — ABNORMAL LOW (ref 3.5–5.0)
Anion gap: 12 (ref 5–15)
BUN: 35 mg/dL — ABNORMAL HIGH (ref 6–20)
CO2: 28 mmol/L (ref 22–32)
Calcium: 9.3 mg/dL (ref 8.9–10.3)
Chloride: 95 mmol/L — ABNORMAL LOW (ref 98–111)
Creatinine, Ser: 5.69 mg/dL — ABNORMAL HIGH (ref 0.61–1.24)
GFR, Estimated: 11 mL/min — ABNORMAL LOW (ref >60)
Glucose, Bld: 104 mg/dL — ABNORMAL HIGH (ref 70–99)
Phosphorus: 4.3 mg/dL (ref 2.5–4.6)
Potassium: 4.6 mmol/L (ref 3.5–5.1)
Sodium: 135 mmol/L (ref 135–145)

## 2022-04-08 MED ORDER — CHLORHEXIDINE GLUCONATE CLOTH 2 % EX PADS
6.0000 | MEDICATED_PAD | Freq: Every day | CUTANEOUS | Status: DC
  Administered 2022-04-09 – 2022-04-10 (×2): 6 via TOPICAL

## 2022-04-08 MED ORDER — SODIUM ZIRCONIUM CYCLOSILICATE 10 G PO PACK
10.0000 g | PACK | Freq: Once | ORAL | Status: AC
  Administered 2022-04-08: 10 g via ORAL
  Filled 2022-04-08: qty 1

## 2022-04-08 NOTE — Progress Notes (Addendum)
PT Cancellation Note  Patient Details Name: Cody Macdonald MRN: [id number] DOB: 1961/06/29   Cancelled Treatment:    Reason Eval/Treat Not Completed: Other (comment)   Patient reports they are not feeding him enough on the heart healthy and renal diet. He doesn't have the strength to work with PT at this time and wants to wait until after he eats lunch.   Addendum 13:44: Patient refused twice more this p.m. with no medical reason. If pt continues to refuse, will discharge from PT services.    Why  Office [phone]   Rexanne Mano 04/08/2022, 9:32 AM

## 2022-04-08 NOTE — Progress Notes (Signed)
HD#0 SUBJECTIVE:  Patient Summary: Cody Macdonald is a 61 y.o. with pertinent PMH of ESRD on HD who presented with SOB and admitted for volume overload  Overnight Events: no acute overnight events    Interm History:  Patient seen in HD bed today. Resting comfortably. No complaints. Informed pt that we are continuing to work with SW for return to SNF.   OBJECTIVE:  Vital Signs: Vitals:   04/07/22 2133 04/08/22 0526 04/08/22 0900 04/08/22 1101  BP: 134/76 (!) 144/85 134/78 (!) 146/83  Pulse: 78 70 74 80  Resp: 18 15 16    Temp: 99.2 F (37.3 C) 98.3 F (36.8 C) 98.3 F (36.8 C)   TempSrc: Oral Oral Oral   SpO2: 99% 99% 98%   Weight:      Height:       Supplemental O2:  SpO2: 98 % O2 Flow Rate (L/min): 3 L/min  Filed Weights   04/05/22 1819 04/06/22 0500 04/07/22 0744  Weight: 49.8 kg 49.8 kg 48.3 kg     Intake/Output Summary (Last 24 hours) at 04/08/2022 1145 Last data filed at 04/08/2022 0800 Gross per 24 hour  Intake 1200 ml  Output 0 ml  Net 1200 ml   Net IO Since Admission: 398 mL [04/08/22 1145]  Physical Exam: Constitutional: chronically frail appearing male, resting in bed,  in no acute distress Cardiovascular: regular rate and rhythm, no m/r/g Pulmonary/Chest: normal work of breathing on room air, lungs clear to auscultation bilaterally MSK: diminished bulk, contracted Left side Neurological: alert  Skin: warm and dry, no pressure ulcers/skin breakdown seen  Patient Lines/Drains/Airways Status     Active Line/Drains/Airways     Name Placement date Placement time Site Days   Peripheral IV 04/04/22 20 G 1" Posterior;Right Forearm 04/04/22  0450  Forearm  1   Hemodialysis Catheter Right Subclavian --  --  Subclavian  --            Pertinent Labs:    Latest Ref Rng & Units 04/07/2022    8:06 AM 04/05/2022    3:08 AM 04/04/2022   11:40 AM  CBC  WBC 4.0 - 10.5 K/uL 6.7  7.5  8.7   Hemoglobin 13.0 - 17.0 g/dL 8.3  8.7  11.7   Hematocrit 39.0 -  52.0 % 26.7  27.1  37.4   Platelets 150 - 400 K/uL 174  156  228        Latest Ref Rng & Units 04/07/2022    8:07 AM 04/06/2022    2:27 AM 04/05/2022    3:08 AM  CMP  Glucose 70 - 99 mg/dL 80  105  109   BUN 6 - 20 mg/dL 36  19  27   Creatinine 0.61 - 1.24 mg/dL 6.22  3.99  5.92   Sodium 135 - 145 mmol/L 137  138  138   Potassium 3.5 - 5.1 mmol/L 5.8  4.2  5.0   Chloride 98 - 111 mmol/L 99  99  102   CO2 22 - 32 mmol/L 28  30  27    Calcium 8.9 - 10.3 mg/dL 9.1  8.9  8.9      ASSESSMENT/PLAN:  Assessment: Principal Problem:   ESRD (end stage renal disease) on dialysis ([place]) Active Problems:   Volume overload   Hypertension   Pulmonary edema  Cody Macdonald is a 61 y.o. with pertinent PMH of ESRD on HD who presented with SOB and admitted for volume overload on hospital day  0   # Acute hypoxic respiratory failure due to volume overload - resolved # respiratory alkylosis # Pulmonary edema - resolved Pulmonary edema due to volume overload in the setting of ESRD. No missed HD sessions. Had 3L removed for volume overload.   Respiratory failure has resolved at this point. He is breathing comfortably on RA and stable for discharge, pending insurance authorization. Hopeful discharge back to [facility] as soon as insurance is authorized. Facility is contacting Medicaid to investigate delay   # Hypertensive urgency due to volume overload - resolved # HTN Takes hydralazine, labetalol, norvasc, outpatient.  BP better controlled s/p 3L fluid removal and initiation of outpatient BP regimen.  - continue home meds - HD per nephro  # ESRD MWF Dialysis started June 2023. Patient follows with [facility]. Right IJ for access, no fistula present. Given volume overload, it is possible that his EDW has been overestimated. Will need to continue to optimize volume.  If patient cannot be discharged today, will try to get early chair time tomorrow in anticipation of DC - HD per  nephro. - strict I+Os - Daily weights - Continue Renvela   # Anemia of chronic disease Stable. No evidence of bleeding - continue to monitor. Transfuse as necessary - Aranesp/IV iron deferred to nephro   # Hx Cerebral infarction Recent admission 4/14 for subacute stroke. Had been on plavix and lipitor prior to admission. Now resides in SNF and is wheelchair bound. Does not appear to have any new symptoms.  - statin - aspirin - plavix   # HLD - statin   # Malnutrition Related to underlying CKD and hx stroke   # Hx Tobacco, marijuana, alcohol use Denies recent use     Diet: Heart Healthy VTE: Heparin IVF: None,None Code: Full Prior to Admission Living Arrangement: SNF Anticipated Discharge Location: SNF Barriers to Discharge: insurance authorization  Dispo: discharge to SNF once insurance has been authorized   Signed: Delene Ruffini, MD

## 2022-04-08 NOTE — Plan of Care (Addendum)
Outpatient HD orders:  [facility]  MWF  [phone] - HD unit   3K/2.5 ca EDW 53 kg  3 hours  BF 300 DF 600 No heparin  Epogen 4600 units treatment  Zemplar 0.5 mcg every treatment Last post weight there is 53 kg (sometimes gets below)   Has been stable medically since Monday PM - here as obs awaiting insurance approval.   - Renal panel today - HD per MWF schedule - Discussed with outpatient RN that his EDW would need to be lowered.  Could try 50 kg  Claudia Desanctis. MD  2:02 PM 04/08/2022

## 2022-04-08 NOTE — Progress Notes (Signed)
OT Cancellation Note  Patient Details Name: TREJAN BUDA MRN: [id number] DOB: 1960/10/30   Cancelled Treatment:    Reason Eval/Treat Not Completed: Other (comment) Per PT pt. Declining skilled therapies until this afternoon.  Will check back as able.    Tanya Nones 04/08/2022, 11:44 AM

## 2022-04-08 NOTE — TOC Progression Note (Signed)
Transition of Care [facility]) - Initial/Assessment Note    Patient Details  Name: Cody Macdonald MRN: [id number] Date of Birth: [birth year]  Transition of Care [facility]) CM/SW Contact:    Milinda Antis, [place] Phone Number: 04/08/2022, 2:37 PM  Clinical Narrative:                 14:30-  CSW contacted the facility to inquire about insurance auth.  Insurance Josem Kaufmann is still pending.    CSW will continue to follow.          Patient Goals and CMS Choice        Expected Discharge Plan and Services                                                Prior Living Arrangements/Services                       Activities of Daily Living      Permission Sought/Granted                  Emotional Assessment              Admission diagnosis:  SOB (shortness of breath) [R06.02] Volume overload [E87.70] Hypervolemia associated with renal insufficiency [E87.70, N28.9] Patient Active Problem List   Diagnosis Date Noted   Hypertension 04/05/2022   Hyperkalemia 04/05/2022   Pulmonary edema 04/05/2022   Volume overload 04/04/2022   Malnutrition of moderate degree 12/26/2021   ESRD (end stage renal disease) on dialysis ([place]) 12/24/2021   Symptomatic anemia 12/24/2021   History of stroke 12/24/2021   Tobacco abuse 12/24/2021   Alcohol abuse 12/24/2021   Hypoalbuminemia 12/24/2021   AKI (acute kidney injury) ([place]) 12/24/2021   Abdominal distention 12/24/2021   Leg edema 12/24/2021   PCP:  Pcp, No Pharmacy:   [place], [place] [address] [place] [address] Phone: [phone] Fax: [phone]     Social Determinants of Health (SDOH) Interventions    Readmission Risk Interventions     No data to display

## 2022-04-08 NOTE — Progress Notes (Signed)
Contacted by attending staff with request that pt receive HD tomorrow on 1st shift if possible in the event insurance Josem Kaufmann is received for snf placement. Contacted inpt HD unit and spoke to staff regarding request.  Melven Sartorius Renal Navigator [phone]

## 2022-04-09 DIAGNOSIS — Z992 Dependence on renal dialysis: Secondary | ICD-10-CM | POA: Diagnosis not present

## 2022-04-09 DIAGNOSIS — N186 End stage renal disease: Secondary | ICD-10-CM | POA: Diagnosis not present

## 2022-04-09 LAB — RENAL FUNCTION PANEL
Albumin: 2.6 g/dL — ABNORMAL LOW (ref 3.5–5.0)
Anion gap: 14 (ref 5–15)
BUN: 45 mg/dL — ABNORMAL HIGH (ref 6–20)
CO2: 28 mmol/L (ref 22–32)
Calcium: 9.7 mg/dL (ref 8.9–10.3)
Chloride: 93 mmol/L — ABNORMAL LOW (ref 98–111)
Creatinine, Ser: 6.83 mg/dL — ABNORMAL HIGH (ref 0.61–1.24)
GFR, Estimated: 9 mL/min — ABNORMAL LOW (ref >60)
Glucose, Bld: 81 mg/dL (ref 70–99)
Phosphorus: 4.7 mg/dL — ABNORMAL HIGH (ref 2.5–4.6)
Potassium: 4.5 mmol/L (ref 3.5–5.1)
Sodium: 135 mmol/L (ref 135–145)

## 2022-04-09 LAB — CBC
HCT: 25.2 % — ABNORMAL LOW (ref 39.0–52.0)
Hemoglobin: 8.1 g/dL — ABNORMAL LOW (ref 13.0–17.0)
MCH: 30.1 pg (ref 26.0–34.0)
MCHC: 32.1 g/dL (ref 30.0–36.0)
MCV: 93.7 fL (ref 80.0–100.0)
Platelets: 206 10*3/uL (ref 150–400)
RBC: 2.69 MIL/uL — ABNORMAL LOW (ref 4.22–5.81)
RDW: 14.1 % (ref 11.5–15.5)
WBC: 7.7 10*3/uL (ref 4.0–10.5)
nRBC: 0 % (ref 0.0–0.2)

## 2022-04-09 MED ORDER — POLYETHYLENE GLYCOL 3350 17 G PO PACK
17.0000 g | PACK | Freq: Every day | ORAL | Status: DC
  Administered 2022-04-09 – 2022-04-12 (×4): 17 g via ORAL
  Filled 2022-04-09 (×4): qty 1

## 2022-04-09 MED ORDER — HEPARIN SODIUM (PORCINE) 1000 UNIT/ML IJ SOLN
INTRAMUSCULAR | Status: AC
  Administered 2022-04-09: 1000 [IU]
  Filled 2022-04-09: qty 4

## 2022-04-09 MED ORDER — DARBEPOETIN ALFA 60 MCG/0.3ML IJ SOSY
60.0000 ug | PREFILLED_SYRINGE | Freq: Once | INTRAMUSCULAR | Status: AC
  Administered 2022-04-09: 60 ug via SUBCUTANEOUS
  Filled 2022-04-09: qty 0.3

## 2022-04-09 NOTE — Progress Notes (Signed)
Mobility Specialist Progress Note:   04/09/22 1605  Mobility  Activity Turned to right side  Level of Assistance Moderate assist, patient does 50-74%  Activity Response Tolerated well  $Mobility charge 1 Mobility   NT requesting assistance to turn pt in bed. Required modA to turn. Pt left with all needs met.   Nelta Numbers Acute Rehab Secure Chat or Office Phone: [phone]

## 2022-04-09 NOTE — Plan of Care (Signed)

## 2022-04-09 NOTE — TOC Progression Note (Signed)
Transition of Care [facility]) - Initial/Assessment Note    Patient Details  Name: Cody Macdonald MRN: [id number] Date of Birth: Mar 26, 1961  Transition of Care [facility]) CM/SW Contact:    Milinda Antis, LCSWA Phone Number: 04/09/2022, 9:06 AM  Clinical Narrative:                 08:50-  CSW contacted the [facility] to inquire about insurance authorization and Josem Kaufmann is still pending.  TOC will continue to follow.          Patient Goals and CMS Choice        Expected Discharge Plan and Services                                                Prior Living Arrangements/Services                       Activities of Daily Living      Permission Sought/Granted                  Emotional Assessment              Admission diagnosis:  SOB (shortness of breath) [R06.02] Volume overload [E87.70] Hypervolemia associated with renal insufficiency [E87.70, N28.9] Patient Active Problem List   Diagnosis Date Noted   Hypertension 04/05/2022   Hyperkalemia 04/05/2022   Pulmonary edema 04/05/2022   Volume overload 04/04/2022   Malnutrition of moderate degree 12/26/2021   ESRD (end stage renal disease) on dialysis ([place]) 12/24/2021   Symptomatic anemia 12/24/2021   History of stroke 12/24/2021   Tobacco abuse 12/24/2021   Alcohol abuse 12/24/2021   Hypoalbuminemia 12/24/2021   AKI (acute kidney injury) ([place]) 12/24/2021   Abdominal distention 12/24/2021   Leg edema 12/24/2021   PCP:  Pcp, No Pharmacy:   [place], [place] [address] [place] [address] Phone: [phone] Fax: [phone]     Social Determinants of Health (SDOH) Interventions    Readmission Risk Interventions     No data to display

## 2022-04-09 NOTE — Progress Notes (Signed)
HD#0 SUBJECTIVE:  Patient Summary: Cody Macdonald is a 61 y.o. with pertinent PMH of ESRD on HD who presented with SOB and admitted for volume overload  Overnight Events: no acute overnight events    Interm History:  Patient seen in HD bed today. Resting comfortably. No complaints. Informed pt that we are continuing to work with SW for return to SNF.   OBJECTIVE:  Vital Signs: Vitals:   04/09/22 1000 04/09/22 1030 04/09/22 1146 04/09/22 1319  BP: 109/65 106/63 (!) 152/80 (!) 162/84  Pulse: 70 66 72   Resp: 15 (!) 24 17   Temp:   98.1 F (36.7 C)   TempSrc:   Oral   SpO2: 100% 100% 99%   Weight:      Height:       Supplemental O2:  SpO2: 99 % O2 Flow Rate (L/min): 3 L/min  Filed Weights   04/07/22 0744 04/09/22 0500 04/09/22 0705  Weight: 48.3 kg 49.5 kg 50.5 kg     Intake/Output Summary (Last 24 hours) at 04/09/2022 1409 Last data filed at 04/09/2022 1300 Gross per 24 hour  Intake 580 ml  Output 0 ml  Net 580 ml   Net IO Since Admission: 1,218 mL [04/09/22 1409]  Physical Exam: Constitutional: chronically frail appearing male, resting in bed,  in no acute distress Cardiovascular: regular rate and rhythm, no m/r/g Pulmonary/Chest: normal work of breathing on room air, lungs clear to auscultation bilaterally MSK: diminished bulk, contracted Left side Neurological: alert   Patient Lines/Drains/Airways Status     Active Line/Drains/Airways     Name Placement date Placement time Site Days   Peripheral IV 04/04/22 20 G 1" Posterior;Right Forearm 04/04/22  0450  Forearm  1   Hemodialysis Catheter Right Subclavian --  --  Subclavian  --            Pertinent Labs:    Latest Ref Rng & Units 04/09/2022    7:18 AM 04/07/2022    8:06 AM 04/05/2022    3:08 AM  CBC  WBC 4.0 - 10.5 K/uL 7.7  6.7  7.5   Hemoglobin 13.0 - 17.0 g/dL 8.1  8.3  8.7   Hematocrit 39.0 - 52.0 % 25.2  26.7  27.1   Platelets 150 - 400 K/uL 206  174  156        Latest Ref Rng & Units  04/09/2022    7:14 AM 04/08/2022    4:50 PM 04/07/2022    8:07 AM  CMP  Glucose 70 - 99 mg/dL 81  104  80   BUN 6 - 20 mg/dL 45  35  36   Creatinine 0.61 - 1.24 mg/dL 6.83  5.69  6.22   Sodium 135 - 145 mmol/L 135  135  137   Potassium 3.5 - 5.1 mmol/L 4.5  4.6  5.8   Chloride 98 - 111 mmol/L 93  95  99   CO2 22 - 32 mmol/L 28  28  28    Calcium 8.9 - 10.3 mg/dL 9.7  9.3  9.1      ASSESSMENT/PLAN:  Assessment: Principal Problem:   ESRD (end stage renal disease) on dialysis ([place]) Active Problems:   Volume overload   Hypertension   Pulmonary edema  Cody Macdonald is a 61 y.o. with pertinent PMH of ESRD on HD who presented with SOB and admitted for volume overload on hospital day 0   # Acute hypoxic respiratory failure due to volume overload -  resolved # Pulmonary edema - resolved Respiratory failure is resolved. Patient has been medically cleared for discharge for past several days. Insurance authorization still pending and remains only barrier to discharge.     # Hypertensive urgency due to volume overload - resolved # HTN - continue home meds - HD per nephro  # ESRD MWF Dialysis started June 2023. Patient follows with [facility]. Right IJ for access.  Lower dry weight goal 50kg.   Would prefer early HD chair times in the event that patient is able to discharge back to facility.  - HD per nephro. - strict I+Os - Daily weights - Continue Renvela   # Anemia of chronic disease Stable. No evidence of bleeding Received Aranesp today - continue to monitor. Transfuse as necessary - Aranesp/IV iron deferred to nephro   # Hx Cerebral infarction Recent admission 4/14 for subacute stroke. Had been on plavix and lipitor prior to admission. Now resides in SNF and is wheelchair bound.  - statin - aspirin - plavix   # HLD - statin   # Malnutrition Related to underlying CKD and hx stroke   # Hx Tobacco, marijuana, alcohol use Denies recent use      Diet: Heart Healthy VTE: Heparin IVF: None,None Code: Full Prior to Admission Living Arrangement: SNF Anticipated Discharge Location: SNF Barriers to Discharge: insurance authorization  Dispo: discharge to SNF once insurance has been authorized   Signed: Delene Ruffini, MD

## 2022-04-09 NOTE — Progress Notes (Signed)
PT Cancellation Note  Patient Details Name: Cody Macdonald MRN: [id number] DOB: 09-23-1960   Cancelled Treatment:    Reason Eval/Treat Not Completed: (P) Patient at procedure or test/unavailable Pt is off floor for HD. PT will follow up for treatment as able.  Manvir Thorson B. Migdalia Dk PT, DPT Acute Rehabilitation Services Please use secure chat or  Call Office [phone]    [place] 04/09/2022, 9:50 AM

## 2022-04-09 NOTE — Progress Notes (Signed)
Received patient in bed to unit.  Alert and oriented.  Informed consent signed and in chart.   Treatment initiated: 0719 Treatment completed: 1049  Patient tolerated well.  Transported back to the room  Alert, without acute distress.  Hand-off given to patient's nurse.   Access used: Cath Access issues: none  Total UF removed: 2500 Medication(s) given: none Post HD VS: 106/63,70,15,100% Post HD weight: 48.7kg   [facility]

## 2022-04-09 NOTE — Progress Notes (Signed)
OT Cancellation Note  Patient Details Name: Cody Macdonald MRN: [id number] DOB: [birth year]   Cancelled Treatment:    Reason Eval/Treat Not Completed: Patient at procedure or test/ unavailable.  Pt. Currently at HD will check back as able.   Clearnce Sorrel [place]-COTA/L 04/09/2022, 9:15 AM

## 2022-04-09 NOTE — Progress Notes (Signed)
[facility] Progress Note  Name: Cody Macdonald MRN: [id number] DOB: 02-23-1961   Subjective:  Still here awaiting insurance authorization; he is obs.  Had HD this am with 2.5 kg UF.   He hasn't heard any updates about approval yet - he's a little frustrated   Review of systems:      Breathing ok Denies n/v No chest pain   ------------- Background on consult:  Cody Macdonald is an 61 y.o. male with history of hypertension, HLD, ESRD on HD MWF at [facility] presents from [place] facility with shortness of breath for few days, seen as a consultation for the management of ESRD.  Patient reported that he had about 3 hours of dialysis on last Friday without any event.  He does not remember the amount of fluid taken.  In the ER, his blood pressure elevated to 223/125, hypoxic initially required nonrebreather and then on 4 L of oxygen.  The labs showed potassium level of 5.3, BUN 30, creatinine 6.8, hemoglobin of 12.2.  Chest x-ray with pulmonary edema but unable to rule out multifocal pneumonia.     Intake/Output Summary (Last 24 hours) at 04/09/2022 1212 Last data filed at 04/09/2022 0600 Gross per 24 hour  Intake 580 ml  Output 0 ml  Net 580 ml    Vitals:  Vitals:   04/09/22 0930 04/09/22 1000 04/09/22 1030 04/09/22 1146  BP: 115/85 109/65 106/63 (!) 152/80  Pulse: 67 70 66 72  Resp: (!) 8 15 (!) 24 17  Temp:    98.1 F (36.7 C)  TempSrc:    Oral  SpO2: 100% 100% 100% 99%  Weight:      Height:         Physical Exam:     General thin adult male in bed in no acute distress HEENT normocephalic atraumatic extraocular movements intact sclera anicteric Neck supple trachea midline Lungs clear to auscultation bilaterally normal work of breathing at rest on room air  Heart S1S2 no rub Abdomen soft nontender nondistended Extremities no edema  Psych normal mood and affect  Access RIJ tunn catheter in place   Medications reviewed   Labs:      Latest Ref Rng & Units 04/09/2022    7:14 AM 04/08/2022    4:50 PM 04/07/2022    8:07 AM  BMP  Glucose 70 - 99 mg/dL 81  104  80   BUN 6 - 20 mg/dL 45  35  36   Creatinine 0.61 - 1.24 mg/dL 6.83  5.69  6.22   Sodium 135 - 145 mmol/L 135  135  137   Potassium 3.5 - 5.1 mmol/L 4.5  4.6  5.8   Chloride 98 - 111 mmol/L 93  95  99   CO2 22 - 32 mmol/L 28  28  28    Calcium 8.9 - 10.3 mg/dL 9.7  9.3  9.1    Outpatient HD orders:  [facility]  MWF   [phone] - HD unit    3K/2.5 ca EDW 53 kg  3 hours  BF 300 DF 600 No heparin  Epogen 4600 units treatment  Zemplar 0.5 mcg every treatment Last post weight there is 53 kg (sometimes gets below)    Assessment/Plan:   #Acute hypoxic respiratory failure/acute pulmonary edema: per primary team. Optimize volume with HD   # ESRD: MWF with last HD on 9/3 off schedule.  Per patient he goes to [facility].  He has  right IJ for access.  renal diet restriction.  Will need to lower his dry weight outpatient.  Try 50 kg    #Hypertensive urgency: With fluid overload. Optimize volume with HD and continue current regimen    # Anemia of ESRD:  will give aranesp 60 mcg once now   # Metabolic Bone Disease: calcium and phos at goal.    Disposition - HD today.  Stable for discharge from a strictly renal standpoint.  Awaiting insurance auth per charting   Claudia Desanctis, MD 04/09/2022 12:32 PM

## 2022-04-10 DIAGNOSIS — I120 Hypertensive chronic kidney disease with stage 5 chronic kidney disease or end stage renal disease: Secondary | ICD-10-CM | POA: Diagnosis not present

## 2022-04-10 DIAGNOSIS — F17210 Nicotine dependence, cigarettes, uncomplicated: Secondary | ICD-10-CM | POA: Diagnosis not present

## 2022-04-10 DIAGNOSIS — Z992 Dependence on renal dialysis: Secondary | ICD-10-CM | POA: Diagnosis not present

## 2022-04-10 DIAGNOSIS — N186 End stage renal disease: Secondary | ICD-10-CM | POA: Diagnosis not present

## 2022-04-10 NOTE — Progress Notes (Addendum)
    HD#0 SUBJECTIVE:  Patient Summary: Cody Macdonald is a 61 y.o. with pertinent PMH of ESRD on HD who presented with SOB and admitted for volume overload  Overnight Events: no acute overnight events    Interm History:  Patient evaluated at bedside. Asked to have bed repositioned and for more food. Reports he is eager to return to facility. Discussed we are continuing to work on Ship broker.  OBJECTIVE:  Vital Signs: Vitals:   04/09/22 1319 04/09/22 1655 04/09/22 2141 04/10/22 0450  BP: (!) 162/84 132/78 (!) 148/80 138/78  Pulse:  76 76 75  Resp:  18 17 14   Temp:  98.3 F (36.8 C) 98.6 F (37 C) 98.4 F (36.9 C)  TempSrc:   Oral Oral  SpO2:  100% 100% 100%  Weight:      Height:       Supplemental O2:  SpO2: 100 % O2 Flow Rate (L/min): 3 L/min  Filed Weights   04/07/22 0744 04/09/22 0500 04/09/22 0705  Weight: 48.3 kg 49.5 kg 50.5 kg     Intake/Output Summary (Last 24 hours) at 04/10/2022 8811 Last data filed at 04/09/2022 1820 Gross per 24 hour  Intake 720 ml  Output 0 ml  Net 720 ml    Net IO Since Admission: 1,698 mL [04/10/22 0706]  Physical Exam: Constitutional: Laying bed, chronically ill, in no acute distress Cardiovascular: Regular rate and Rhythmin  Pulmonary/Chest: Clear to ascultation bilaterally Neurological: alert and oriented to person and place, Left side contracted   ASSESSMENT/PLAN:  Assessment: Principal Problem:   ESRD (end stage renal disease) on dialysis ([place]) Active Problems:   Volume overload   Hypertension   Pulmonary edema  [name] is a 61 y.o. with pertinent PMH of ESRD on HD who presented with SOB and admitted for volume overload subseqently improbed with HD and is now stable for discharge back to his nursing facility.   # Acute hypoxic respiratory failure due to volume overload - resolved # Pulmonary edema - resolved Respiratory status stable. No signs of fluid overload. Patient remains medially stable  for discharge. Insurance authorization is the only barrier to dischage.  Discussed with TOC still pending insurance authorization.   # Hypertensive urgency due to volume overload - resolved # HTN BP stable, continuing home medications and HD on schedule.  # ESRD MWF Dialysis started June 2023. Patient follows with [facility]. Right IJ for access.  Lower dry weight goal 50kg.   Would prefer early HD chair times in the event that patient is able to discharge back to facility.  - HD per nephro. - strict I+Os - Daily weights - Continue Renvela   # Hx Cerebral infarction #HLD -continue home DAPT and statin - statin - aspirin - plavix    Diet: Heart Healthy VTE: Heparin IVF: None,None Code: Full Prior to Admission Living Arrangement: SNF Anticipated Discharge Location: SNF Barriers to Discharge: insurance authorization  Dispo: discharge to SNF once insurance has been authorized   Signed: Iona Beard, MD 04/10/22 11:27 AM Pager [phone]

## 2022-04-10 NOTE — Progress Notes (Signed)
Per Christine with [facility], Josem Kaufmann remains pending at this time. MD updated.   Wandra Feinstein, MSW, LCSW [phone] (coverage)

## 2022-04-11 DIAGNOSIS — I120 Hypertensive chronic kidney disease with stage 5 chronic kidney disease or end stage renal disease: Secondary | ICD-10-CM | POA: Diagnosis not present

## 2022-04-11 DIAGNOSIS — F17210 Nicotine dependence, cigarettes, uncomplicated: Secondary | ICD-10-CM | POA: Diagnosis not present

## 2022-04-11 DIAGNOSIS — Z992 Dependence on renal dialysis: Secondary | ICD-10-CM | POA: Diagnosis not present

## 2022-04-11 DIAGNOSIS — N186 End stage renal disease: Secondary | ICD-10-CM | POA: Diagnosis not present

## 2022-04-11 MED ORDER — CHLORHEXIDINE GLUCONATE CLOTH 2 % EX PADS: 6.0000 | MEDICATED_PAD | Freq: Every day | CUTANEOUS | Status: DC

## 2022-04-11 NOTE — Progress Notes (Signed)
    HD#0 SUBJECTIVE:  Patient Summary: Cody Macdonald is a 61 y.o. with pertinent PMH of ESRD on HD who presented with SOB and admitted for volume overload  Overnight Events: none    Interm History:  Patient lying in bed this morning. State he feels well. No acute complaints.   OBJECTIVE:  Vital Signs: Vitals:   04/10/22 1647 04/10/22 2127 04/11/22 0527 04/11/22 0847  BP: 135/79 (!) 142/73 127/65 (!) 141/76  Pulse: 80 74 73 79  Resp: 18 18 18 18   Temp: 98 F (36.7 C) 98.1 F (36.7 C) 98.2 F (36.8 C) 98 F (36.7 C)  TempSrc: Oral Oral Oral Oral  SpO2: 100% 100% 98% 100%  Weight:      Height:       Supplemental O2:  SpO2: 100 % O2 Flow Rate (L/min): 3 L/min  Filed Weights   04/09/22 0500 04/09/22 0705 04/10/22 0450  Weight: 49.5 kg 50.5 kg 47.2 kg     Intake/Output Summary (Last 24 hours) at 04/11/2022 1159 Last data filed at 04/11/2022 0845 Gross per 24 hour  Intake 700 ml  Output 0 ml  Net 700 ml    Net IO Since Admission: 2,638 mL [04/11/22 1159]  Physical Exam: Constitutional: ill appearing, appears comfortable Eyes: EOMI, PERRLA Cardiovascular: RRR, no murmurs Neurological: alert and oriented to person and place, Left side contracted  Psych: normal mood and affect  ASSESSMENT/PLAN:  Assessment: Principal Problem:   ESRD (end stage renal disease) on dialysis ([place]) Active Problems:   Volume overload   Hypertension   Pulmonary edema  Cody Macdonald is a 61 y.o. with pertinent PMH of ESRD on HD who presented with SOB and admitted for volume overload subseqently improbed with HD and is now stable for discharge back to his nursing facility.   # Acute hypoxic respiratory failure due to volume overload - resolved # Pulmonary edema - resolved Remains medially stable for discharge. No signs of fluid overload. Insurance authorization is the only barrier to dischage.  Discussed with SW, insurance Josem Kaufmann is still pending. Hope to hear back Monday.  # ESRD  MWF Dialysis started June 2023. Patient follows with [facility]. Right IJ for access.  - HD on MWF per nephrology - monitor I/o, weights - Continue Renvela - RFP in am  #Hypertension -continue home medications   # Hx Cerebral infarction #HLD -continue home DAPT and statin    Diet: Heart Healthy VTE: Heparin IVF: None,None Code: Full Prior to Admission Living Arrangement: SNF Anticipated Discharge Location: SNF Barriers to Discharge: insurance authorization  Dispo: discharge to SNF once insurance has been authorized   Signed: Iona Beard, MD 04/11/22 11:59 AM Pager [phone]

## 2022-04-11 NOTE — Progress Notes (Signed)
[facility] Progress Note  Name: Cody Macdonald MRN: [id number] DOB: [birth year]   Subjective:  He feels fine.  Still here awaiting insurance authorization to go back to his regular facility; he's still obs.  He states he wonders what's going on with the living facility  Review of systems:      Breathing ok  Denies n/v No chest pain   ------------- Background on consult:  Cody Macdonald is an 61 y.o. male with history of hypertension, HLD, ESRD on HD MWF at [facility] presents from [place] facility with shortness of breath for few days, seen as a consultation for the management of ESRD.  Patient reported that he had about 3 hours of dialysis on last Friday without any event.  He does not remember the amount of fluid taken.  In the ER, his blood pressure elevated to 223/125, hypoxic initially required nonrebreather and then on 4 L of oxygen.  The labs showed potassium level of 5.3, BUN 30, creatinine 6.8, hemoglobin of 12.2.  Chest x-ray with pulmonary edema but unable to rule out multifocal pneumonia.     Intake/Output Summary (Last 24 hours) at 04/11/2022 1052 Last data filed at 04/11/2022 0845 Gross per 24 hour  Intake 700 ml  Output 0 ml  Net 700 ml    Vitals:  Vitals:   04/10/22 1647 04/10/22 2127 04/11/22 0527 04/11/22 0847  BP: 135/79 (!) 142/73 127/65 (!) 141/76  Pulse: 80 74 73 79  Resp: 18 18 18 18   Temp: 98 F (36.7 C) 98.1 F (36.7 C) 98.2 F (36.8 C) 98 F (36.7 C)  TempSrc: Oral Oral Oral Oral  SpO2: 100% 100% 98% 100%  Weight:      Height:         Physical Exam:      General thin adult male in bed in no acute distress HEENT normocephalic atraumatic extraocular movements intact sclera anicteric Neck supple trachea midline Lungs clear to auscultation bilaterally normal work of breathing at rest on room air  Heart S1S2 no rub Abdomen soft nontender nondistended Extremities no edema  Psych normal mood and affect  Access RIJ  tunn catheter in place   Medications reviewed   Labs:     Latest Ref Rng & Units 04/09/2022    7:14 AM 04/08/2022    4:50 PM 04/07/2022    8:07 AM  BMP  Glucose 70 - 99 mg/dL 81  104  80   BUN 6 - 20 mg/dL 45  35  36   Creatinine 0.61 - 1.24 mg/dL 6.83  5.69  6.22   Sodium 135 - 145 mmol/L 135  135  137   Potassium 3.5 - 5.1 mmol/L 4.5  4.6  5.8   Chloride 98 - 111 mmol/L 93  95  99   CO2 22 - 32 mmol/L 28  28  28    Calcium 8.9 - 10.3 mg/dL 9.7  9.3  9.1    Outpatient HD orders:  [facility]  MWF   [phone] - HD unit    3K/2.5 ca EDW 53 kg  3 hours  BF 300 DF 600 No heparin  Epogen 4600 units treatment  Zemplar 0.5 mcg every treatment Last post weight there is 53 kg (sometimes gets below)    Assessment/Plan:   #Acute hypoxic respiratory failure/acute pulmonary edema: per primary team. Resolved. Have optimized volume with HD   # ESRD: MWF HD.  usually at [facility]. - renal diet restriction.   -  Will need to lower his dry weight outpatient.  Try 49.5 kg  - HD per MWF schedule  - renal panel in am if still here    #Hypertensive urgency - resolved: With fluid overload. Optimize volume with HD and continue current regimen. Lower EDW outpatient as above    # Anemia of ESRD:  gave aranesp 60 mcg once on Friday, 9/8   # Metabolic Bone Disease: calcium and phos at goal.    Disposition - HD per MWF schedule.  Stable for discharge from a strictly renal standpoint.  Awaiting insurance auth per charting.    Should he present again for a dialysis-related issue would please just dialyze and then reassess in the ER to see if he still needs obs admission.  He has been stable for discharge since very early on in his stay (after his first HD treatment) and has been here almost a week just awaiting authorization to go back to his usual living facility  Claudia Desanctis, MD 04/11/2022 11:07 AM

## 2022-04-12 DIAGNOSIS — Z992 Dependence on renal dialysis: Secondary | ICD-10-CM | POA: Diagnosis not present

## 2022-04-12 DIAGNOSIS — N186 End stage renal disease: Secondary | ICD-10-CM | POA: Diagnosis not present

## 2022-04-12 LAB — RENAL FUNCTION PANEL
Albumin: 2.5 g/dL — ABNORMAL LOW (ref 3.5–5.0)
Anion gap: 13 (ref 5–15)
BUN: 60 mg/dL — ABNORMAL HIGH (ref 6–20)
CO2: 28 mmol/L (ref 22–32)
Calcium: 9.8 mg/dL (ref 8.9–10.3)
Chloride: 94 mmol/L — ABNORMAL LOW (ref 98–111)
Creatinine, Ser: 9.1 mg/dL — ABNORMAL HIGH (ref 0.61–1.24)
GFR, Estimated: 6 mL/min — ABNORMAL LOW (ref >60)
Glucose, Bld: 87 mg/dL (ref 70–99)
Phosphorus: 4.7 mg/dL — ABNORMAL HIGH (ref 2.5–4.6)
Potassium: 4.8 mmol/L (ref 3.5–5.1)
Sodium: 135 mmol/L (ref 135–145)

## 2022-04-12 LAB — CBC
HCT: 27 % — ABNORMAL LOW (ref 39.0–52.0)
Hemoglobin: 9 g/dL — ABNORMAL LOW (ref 13.0–17.0)
MCH: 30.4 pg (ref 26.0–34.0)
MCHC: 33.3 g/dL (ref 30.0–36.0)
MCV: 91.2 fL (ref 80.0–100.0)
Platelets: 255 10*3/uL (ref 150–400)
RBC: 2.96 MIL/uL — ABNORMAL LOW (ref 4.22–5.81)
RDW: 13.9 % (ref 11.5–15.5)
WBC: 7.3 10*3/uL (ref 4.0–10.5)
nRBC: 0 % (ref 0.0–0.2)

## 2022-04-12 MED ORDER — HEPARIN SODIUM (PORCINE) 1000 UNIT/ML IJ SOLN
2000.0000 [IU] | Freq: Once | INTRAMUSCULAR | Status: AC
  Administered 2022-04-12: 2000 [IU] via INTRAVENOUS

## 2022-04-12 MED ORDER — HEPARIN SODIUM (PORCINE) 1000 UNIT/ML DIALYSIS
1000.0000 [IU] | INTRAMUSCULAR | Status: DC | PRN
Start: 2022-04-12 — End: 2022-04-12
  Administered 2022-04-12: 1000 [IU]
  Filled 2022-04-12 (×2): qty 1

## 2022-04-12 MED ORDER — ANTICOAGULANT SODIUM CITRATE 4% (200MG/5ML) IV SOLN: 5.0000 mL | Status: DC | PRN

## 2022-04-12 MED ORDER — ALTEPLASE 2 MG IJ SOLR: 2.0000 mg | Freq: Once | INTRAMUSCULAR | Status: DC | PRN

## 2022-04-12 NOTE — Progress Notes (Signed)
[facility] Progress Note  Name: Cody Macdonald MRN: [id number] DOB: Apr 01, 1961   Subjective:  He feels fine.  Still here awaiting insurance authorization to go back to his regular facility; he's still obs.  He states he wonders what's going on with the living facility  Review of systems:      Breathing ok  Denies n/v No chest pain   ------------- Background on consult:  MAXIM BEDEL is an 61 y.o. male with history of hypertension, HLD, ESRD on HD MWF at [facility] presents from [place] facility with shortness of breath for few days, seen as a consultation for the management of ESRD.  Patient reported that he had about 3 hours of dialysis on last Friday without any event.  He does not remember the amount of fluid taken.  In the ER, his blood pressure elevated to 223/125, hypoxic initially required nonrebreather and then on 4 L of oxygen.  The labs showed potassium level of 5.3, BUN 30, creatinine 6.8, hemoglobin of 12.2.  Chest x-ray with pulmonary edema but unable to rule out multifocal pneumonia.     Intake/Output Summary (Last 24 hours) at 04/12/2022 0848 Last data filed at 04/12/2022 0526 Gross per 24 hour  Intake 240 ml  Output 0 ml  Net 240 ml     Vitals:  Vitals:   04/12/22 0526 04/12/22 0748 04/12/22 0759 04/12/22 0830  BP: (!) 142/87 (!) 154/80 139/73 (!) 88/63  Pulse: 74 74 71 63  Resp: 18 13 13 14   Temp: 98.4 F (36.9 C) 97.9 F (36.6 C)    TempSrc:  Oral    SpO2: 99% 100% 100% 100%  Weight:  49.3 kg    Height:         Physical Exam:      General thin adult male in bed in no acute distress HEENT normocephalic atraumatic extraocular movements intact sclera anicteric Neck supple trachea midline Lungs clear to auscultation bilaterally normal work of breathing at rest on room air  Heart S1S2 no rub Abdomen soft nontender nondistended Extremities no edema  Psych normal mood and affect  Access RIJ tunn catheter in place     Medications reviewed   Labs:     Latest Ref Rng & Units 04/12/2022    6:33 AM 04/09/2022    7:14 AM 04/08/2022    4:50 PM  BMP  Glucose 70 - 99 mg/dL 87  81  104   BUN 6 - 20 mg/dL 60  45  35   Creatinine 0.61 - 1.24 mg/dL 9.10  6.83  5.69   Sodium 135 - 145 mmol/L 135  135  135   Potassium 3.5 - 5.1 mmol/L 4.8  4.5  4.6   Chloride 98 - 111 mmol/L 94  93  95   CO2 22 - 32 mmol/L 28  28  28    Calcium 8.9 - 10.3 mg/dL 9.8  9.7  9.3    Outpatient HD orders:  [facility]  MWF   [phone] - HD unit    3K/2.5 ca EDW 53 kg  3 hours  BF 300 DF 600 No heparin  Epogen 4600 units treatment  Zemplar 0.5 mcg every treatment Last post weight there is 53 kg (sometimes gets below)    Assessment/Plan:   #Acute hypoxic respiratory failure/acute pulmonary edema: per primary team. Resolved. Have optimized volume with HD   # ESRD: MWF HD.  usually at [facility]. - renal diet restriction.   -  Will need to lower his dry weight outpatient.  Trying 49.5 kg  - HD per MWF schedule  - on HD now.  Some AP problems with today's treatment - will try running reverse; if persists d/w RN to pack catheter with tPA after treatment   #Hypertensive urgency - resolved: With fluid overload. Optimize volume with HD and continue current regimen. Lower EDW outpatient as above - with low BPs today will actually reduce UF 2L to 1L   # Anemia of ESRD:  gave aranesp 60 mcg once on Friday, 9/8   # Metabolic Bone Disease: calcium and phos at goal.    Disposition - HD per MWF schedule.  Stable for discharge from a strictly renal standpoint.  Awaiting insurance auth per charting.    Should he present again for a dialysis-related issue would please just dialyze and then reassess in the ER to see if he still needs obs admission.  He has been stable for discharge since very early on in his stay (after his first HD treatment) and has been here almost a week just awaiting authorization to go back to his usual  living facility  Justin Mend, MD 04/12/2022 8:48 AM

## 2022-04-12 NOTE — Progress Notes (Addendum)
    HD#0 SUBJECTIVE:  Patient Summary: Cody Macdonald is a 61 y.o. with pertinent PMH of ESRD on HD who presented with SOB and admitted for volume overload  Overnight Events: none    Interm History:  Patient lying in HD bed this morning. No complaints. Says he is eating and drinking. Having normal BM.   OBJECTIVE:  Vital Signs: Vitals:   04/12/22 0930 04/12/22 1000 04/12/22 1030 04/12/22 1100  BP: 121/69 130/69 132/70 (!) 140/73  Pulse: 65 65 65 66  Resp: (!) 6 12 (!) 7 (!) 9  Temp:      TempSrc:      SpO2: 100% 100% 100% 100%  Weight:      Height:       Supplemental O2:  SpO2: 100 % O2 Flow Rate (L/min): 3 L/min  Filed Weights   04/09/22 0705 04/10/22 0450 04/12/22 0748  Weight: 50.5 kg 47.2 kg 49.3 kg     Intake/Output Summary (Last 24 hours) at 04/12/2022 1144 Last data filed at 04/12/2022 0526 Gross per 24 hour  Intake 240 ml  Output 0 ml  Net 240 ml   Net IO Since Admission: 2,878 mL [04/12/22 1144]  Physical Exam:  Constitutional: chronically ill appearing, appears comfortable Cardiovascular: RRR, no murmurs Pulm: CTAB, unable to auscultate posterior.  Neurological: alert and oriented to person and place, Left side contracted  Psych: normal mood and affect  ASSESSMENT/PLAN:  Assessment: Principal Problem:   ESRD (end stage renal disease) on dialysis ([place]) Active Problems:   Volume overload   Hypertension   Pulmonary edema  Cody Macdonald is a 61 y.o. with pertinent PMH of ESRD on HD who presented with SOB and admitted for volume overload subseqently improbed with HD and is now stable for discharge back to his nursing facility.   # Acute hypoxic respiratory failure due to volume overload - resolved # Pulmonary edema - resolved Remains medically stable for discharge. No signs of fluid overload. VSS. Insurance authorization is the only barrier to dischage. Social work continues to work on his case. No update yet. Will continue to follow up with  them as necessary.    # ESRD MWF Dialysis started June 2023. Patient follows with [facility]. Right IJ THDC was placed by IR several months ago. Patient will need fistula for long term access. He has not been evaluated by [facility] previously. Discussed with Nephro who felt that this could be followed up in outpatient setting. - HD on MWF per nephrology - monitor I/o, weights - Continue Renvela - Planned for left UE AV fistula formation on 9/18, will need to stop plavix 5 days before.  #Hypertension -continue home medications   # Hx Cerebral infarction #HLD -continue home DAPT and statin    Diet: Heart Healthy VTE: Heparin IVF: None,None Code: Full Prior to Admission Living Arrangement: SNF Anticipated Discharge Location: SNF Barriers to Discharge: insurance authorization  Dispo: discharge to SNF once insurance has been authorized   Signed: Delene Ruffini, MD

## 2022-04-12 NOTE — Progress Notes (Signed)
Received patient in bed to unit.  Alert and oriented.  Informed consent signed and in chart.   Treatment initiated: [place] Treatment completed: 1201  Patient tolerated well.  Transported back to the room  Alert, without acute distress.  Hand-off given to patient's nurse.   Access used: HD Cath  Access issues: clotting issues in system but not w/ cath  Total UF removed: 1000 Medication(s) given: Heparin Dwells Post HD VS: 97.8    162/87    71    15    100% RA Post HD weight: 48.2kg   [facility]

## 2022-04-12 NOTE — Plan of Care (Signed)
  Problem: Education: Goal: Knowledge of General Education information will improve Description: Including pain rating scale, medication(s)/side effects and non-pharmacologic comfort measures Outcome: Completed/Met   Problem: Health Behavior/Discharge Planning: Goal: Ability to manage health-related needs will improve Outcome: Completed/Met   Problem: Clinical Measurements: Goal: Ability to maintain clinical measurements within normal limits will improve Outcome: Completed/Met Goal: Will remain free from infection Outcome: Completed/Met Goal: Diagnostic test results will improve Outcome: Completed/Met Goal: Respiratory complications will improve Outcome: Completed/Met Goal: Cardiovascular complication will be avoided Outcome: Completed/Met   Problem: Activity: Goal: Risk for activity intolerance will decrease Outcome: Completed/Met   Problem: Nutrition: Goal: Adequate nutrition will be maintained Outcome: Completed/Met   Problem: Coping: Goal: Level of anxiety will decrease Outcome: Completed/Met   Problem: Elimination: Goal: Will not experience complications related to bowel motility Outcome: Completed/Met Goal: Will not experience complications related to urinary retention Outcome: Completed/Met   Problem: Pain Managment: Goal: General experience of comfort will improve Outcome: Completed/Met   Problem: Safety: Goal: Ability to remain free from injury will improve Outcome: Completed/Met   Problem: Skin Integrity: Goal: Risk for impaired skin integrity will decrease Outcome: Completed/Met   Problem: Education: Goal: Knowledge of disease and its progression will improve Outcome: Completed/Met Goal: Individualized Educational Video(s) Outcome: Completed/Met   Problem: Fluid Volume: Goal: Compliance with measures to maintain balanced fluid volume will improve Outcome: Completed/Met   Problem: Health Behavior/Discharge Planning: Goal: Ability to manage  health-related needs will improve Outcome: Completed/Met   Problem: Nutritional: Goal: Ability to make healthy dietary choices will improve Outcome: Completed/Met   Problem: Clinical Measurements: Goal: Complications related to the disease process, condition or treatment will be avoided or minimized Outcome: Completed/Met   

## 2022-04-12 NOTE — Discharge Summary (Cosign Needed Addendum)
Name: Cody Macdonald MRN: [id number] DOB: February 03, 1961 61 y.o. PCP: Pcp, No  Date of Admission: 04/04/2022  4:47 AM Date of Discharge: 04/12/22 Attending Physician: Dr. Daryll Drown  Discharge Diagnosis: Principal Problem:   ESRD (end stage renal disease) on dialysis [facility]) Active Problems:   Volume overload   Hypertension   Pulmonary edema    Discharge Medications: Allergies as of 04/12/2022   No Known Allergies      Medication List     STOP taking these medications    oxyCODONE-acetaminophen 5-325 MG tablet Commonly known as: PERCOCET/ROXICET       TAKE these medications    albuterol (2.5 MG/3ML) 0.083% nebulizer solution Commonly known as: PROVENTIL Take 3 mLs (2.5 mg total) by nebulization every 6 (six) hours as needed for wheezing or shortness of breath.   amLODipine 10 MG tablet Commonly known as: NORVASC Take 1 tablet (10 mg total) by mouth daily.   aspirin EC 81 MG tablet Take 81 mg by mouth daily. Swallow whole.   atorvastatin 80 MG tablet Commonly known as: LIPITOR Take 80 mg by mouth at bedtime.   clopidogrel 75 MG tablet Commonly known as: PLAVIX Take 75 mg by mouth daily.   ergocalciferol 1.25 MG (50000 UT) capsule Commonly known as: VITAMIN D2 Take 50,000 Units by mouth every Tuesday.   feeding supplement Liqd Take 237 mLs by mouth 2 (two) times daily between meals.   hydrALAZINE 50 MG tablet Commonly known as: APRESOLINE Take 1 tablet (50 mg total) by mouth every 8 (eight) hours. What changed: when to take this   labetalol 300 MG tablet Commonly known as: NORMODYNE Take 1 tablet (300 mg total) by mouth 2 (two) times daily.   melatonin 3 MG Tabs tablet Take 3 mg by mouth at bedtime.   multivitamin Tabs tablet Take 1 tablet by mouth at bedtime.   pantoprazole 40 MG tablet Commonly known as: PROTONIX Take 40 mg by mouth daily.   sevelamer carbonate 800 MG tablet Commonly known as: RENVELA Take 1 tablet (800 mg total) by mouth 3  (three) times daily with meals.        Disposition and follow-up:   Cody Macdonald was discharged from [facility] in Stable condition.  At the hospital follow up visit please address:  1.  Follow-up:  a. Acute hypoxic respiratory failure - ensure no signs of volume overload. No baseline O2 requirement.     b. ESRD - Underwent HD Monday 9/11. Next session Wednesday on schedule. Needs follow up with [facility]. Planned for OR for LUE AV access creation (bracheobasilic vs graft) with tentative date 04/19/22. Hold plavix 5 days prior Sept 13th).     c. Hx Cerebral infarction - on DAPT   2.  Labs / imaging needed at time of follow-up: renal function panel  3.  Pending labs/ test needing follow-up: none  4.  Medication Changes none  Follow-up Appointments: long-term SNF PCP  Hospital Course by problem list:  Patient presented with complaints of shortness of breath. Imaging revealed that patient had pulmonary edema with O2 requirement 3L. He had not missed any HD sessions. He was emergently dialyzed with immediate improvement in his respiratory status. Patient was dialyzed the following day as well by nephro to keep him on his MWF schedule. Home medications were restarted without complication. Social work was consulted to assist with return to SNF. Insurance authorization required several additional days of hospital stay while awaiting approval. He remained  stable throughout.    Discharge Subjective: Patient lying in HD bed this morning. No complaints. Says he is eating and drinking. Having normal BM.   Discharge Exam:   BP (!) 162/87 (BP Location: Right Arm)   Pulse 71   Temp 97.8 F (36.6 C) (Oral)   Resp 15   Ht 5\' 8"  (1.727 m)   Wt 48.2 kg   SpO2 100%   BMI 16.16 kg/m  Constitutional: chronically ill appearing, appears comfortable Cardiovascular: RRR, no murmurs Pulm: CTAB, unable to auscultate posterior.  Neurological: alert and  oriented to person and place, Left side contracted  Psych: normal mood and affect  Pertinent Labs, Studies, and Procedures:     Latest Ref Rng & Units 04/12/2022    6:54 AM 04/09/2022    7:18 AM 04/07/2022    8:06 AM  CBC  WBC 4.0 - 10.5 K/uL 7.3  7.7  6.7   Hemoglobin 13.0 - 17.0 g/dL 9.0  8.1  8.3   Hematocrit 39.0 - 52.0 % 27.0  25.2  26.7   Platelets 150 - 400 K/uL 255  206  174        Latest Ref Rng & Units 04/12/2022    6:33 AM 04/09/2022    7:14 AM 04/08/2022    4:50 PM  CMP  Glucose 70 - 99 mg/dL 87  81  104   BUN 6 - 20 mg/dL 60  45  35   Creatinine 0.61 - 1.24 mg/dL 9.10  6.83  5.69   Sodium 135 - 145 mmol/L 135  135  135   Potassium 3.5 - 5.1 mmol/L 4.8  4.5  4.6   Chloride 98 - 111 mmol/L 94  93  95   CO2 22 - 32 mmol/L 28  28  28    Calcium 8.9 - 10.3 mg/dL 9.8  9.7  9.3     DG Chest Port 1 View  Result Date: 04/04/2022 CLINICAL DATA:  Shortness of breath. EXAM: PORTABLE CHEST 1 VIEW COMPARISON:  12/28/2021 FINDINGS: Diffuse interstitial and basilar airspace disease, right greater than left is likely related to pulmonary edema although multifocal pneumonia is not excluded. Probable tiny right pleural effusion. Cardiopericardial silhouette is at upper limits of normal for size. Right central line again noted. Telemetry leads overlie the chest. IMPRESSION: Diffuse interstitial and basilar airspace disease, right greater than left, likely related to pulmonary edema although multifocal pneumonia is not excluded. Electronically Signed   By: Misty Stanley M.D.   On: 04/04/2022 05:38     Discharge Instructions: Discharge Instructions     Call MD for:  difficulty breathing, headache or visual disturbances   Complete by: As directed    Call MD for:  extreme fatigue   Complete by: As directed    Call MD for:  hives   Complete by: As directed    Call MD for:  persistant dizziness or light-headedness   Complete by: As directed    Call MD for:  persistant nausea and vomiting    Complete by: As directed    Call MD for:  redness, tenderness, or signs of infection (pain, swelling, redness, odor or green/yellow discharge around incision site)   Complete by: As directed    Call MD for:  severe uncontrolled pain   Complete by: As directed    Call MD for:  temperature >100.4   Complete by: As directed    Diet - low sodium heart healthy   Complete by: As directed  Increase activity slowly   Complete by: As directed        Signed: Delene Ruffini, MD 04/12/2022, 12:44 PM   Pager: [phone]

## 2022-04-12 NOTE — Progress Notes (Signed)
DISCHARGE NOTE SNF [facility] to be discharged Skilled nursing facility per MD order. Patient verbalized understanding.  Skin clean, dry and intact without evidence of skin break down, no evidence of skin tears noted. IV catheter discontinued intact. Site without signs and symptoms of complications. Dressing and pressure applied. Pt denies pain at the site currently. No complaints noted.  Patient free of lines, drains, and wounds.   Discharge packet assembled. An After Visit Summary (AVS) was printed and given to the EMS personnel. Patient escorted via stretcher and discharged to [facility] via ambulance. Report called to accepting facility; all questions and concerns addressed.   Vira Agar, RN

## 2022-04-12 NOTE — Progress Notes (Signed)
OT Cancellation Note  Patient Details Name: Cody Macdonald MRN: [id number] DOB: Feb 16, 1961   Cancelled Treatment:    Reason Eval/Treat Not Completed: Patient at procedure or test/ unavailable (HD)  Malka So 04/12/2022, 9:06 AM Cleta Alberts, OTR/L Acute Rehabilitation Services Office: [phone]

## 2022-04-12 NOTE — Progress Notes (Signed)
Contacted by CSW that pt should d/c today back to snf. Contacted [facility] in [facility] and spoke to [place]. Clinic advised of pt's d/c today and that pt should resume care on Wednesday. Clinic has access to [facility] epic and to print d/c summary once available.   Melven Sartorius Renal Navigator [phone]

## 2022-04-12 NOTE — TOC Progression Note (Signed)
Transition of Care [facility]) - Progression Note    Patient Details  Name: Cody Macdonald MRN: [id number] Date of Birth: Nov 24, 1960  Transition of Care [facility]) CM/SW [place], [place] Phone Number: 04/12/2022, 11:56 AM  Clinical Narrative:    [facility] has received insurance approval for patient to return today.    Expected Discharge Plan: [place] Barriers to Discharge: Barriers Resolved  Expected Discharge Plan and Services Expected Discharge Plan: [place] In-house Referral: Clinical Social Work   Post Acute Care Choice: [place] Living arrangements for the past 2 months: [place]                                       Social Determinants of Health (SDOH) Interventions    Readmission Risk Interventions     No data to display

## 2022-04-12 NOTE — Progress Notes (Signed)
Report called to [facility] at [phone].  Aurther Loft, RN

## 2022-04-12 NOTE — TOC Transition Note (Signed)
Transition of Care [facility]) - CM/SW Discharge Note   Patient Details  Name: Cody Macdonald MRN: [id number] Date of Birth: 1961/01/02  Transition of Care [facility]) CM/SW Contact:  Benard Halsted, LCSW Phone Number: 04/12/2022, 1:13 PM   Clinical Narrative:    Patient will DC to: [facility] Anticipated DC date: 04/12/22 Family notified: Pt notifying family Transport by: Corey Harold   Per MD patient ready for DC to [facility]. RN to call report prior to discharge ([phone]). RN, patient, patient's family, and facility notified of DC. Discharge Summary and FL2 sent to facility. DC packet on chart. Ambulance transport requested for patient.   CSW will sign off for now as social work intervention is no longer needed. Please consult [place] again if new needs arise.     Final next level of care: Skilled Nursing Facility Barriers to Discharge: Barriers Resolved   Patient Goals and CMS Choice Patient states their goals for this hospitalization and ongoing recovery are:: Return to ltc CMS Medicare.gov Compare Post Acute Care list provided to:: Patient Choice offered to / list presented to : Patient  Discharge Placement   Existing PASRR number confirmed : 04/12/22          Patient chooses bed at: Other - please specify in the comment section below: [facility]) Patient to be transferred to facility by: [place] Name of family member notified: Pt notifying family Patient and family notified of of transfer: 04/12/22  Discharge Plan and Services In-house Referral: Clinical Social Work   Post Acute Care Choice: [place]                               Social Determinants of Health (SDOH) Interventions     Readmission Risk Interventions     No data to display

## 2022-06-08 ENCOUNTER — Emergency Department (HOSPITAL_COMMUNITY): Payer: Medicaid Other

## 2022-06-08 ENCOUNTER — Inpatient Hospital Stay (HOSPITAL_COMMUNITY)
Admission: EM | Admit: 2022-06-08 | Discharge: 2022-07-08 | DRG: 853 | Disposition: A | Discharge status: Skilled Nursing Facility | Payer: Medicaid Other | Source: Skilled Nursing Facility | Attending: Family Medicine | Admitting: Family Medicine

## 2022-06-08 DIAGNOSIS — R54 Age-related physical debility: Secondary | ICD-10-CM | POA: Diagnosis present

## 2022-06-08 DIAGNOSIS — J9601 Acute respiratory failure with hypoxia: Secondary | ICD-10-CM | POA: Diagnosis present

## 2022-06-08 DIAGNOSIS — J189 Pneumonia, unspecified organism: Secondary | ICD-10-CM | POA: Diagnosis present

## 2022-06-08 DIAGNOSIS — I739 Peripheral vascular disease, unspecified: Secondary | ICD-10-CM | POA: Diagnosis present

## 2022-06-08 DIAGNOSIS — L89152 Pressure ulcer of sacral region, stage 2: Secondary | ICD-10-CM | POA: Diagnosis present

## 2022-06-08 DIAGNOSIS — Z992 Dependence on renal dialysis: Secondary | ICD-10-CM | POA: Diagnosis not present

## 2022-06-08 DIAGNOSIS — D631 Anemia in chronic kidney disease: Secondary | ICD-10-CM | POA: Diagnosis present

## 2022-06-08 DIAGNOSIS — I160 Hypertensive urgency: Secondary | ICD-10-CM | POA: Diagnosis not present

## 2022-06-08 DIAGNOSIS — J1001 Influenza due to other identified influenza virus with the same other identified influenza virus pneumonia: Secondary | ICD-10-CM | POA: Diagnosis present

## 2022-06-08 DIAGNOSIS — R64 Cachexia: Secondary | ICD-10-CM | POA: Diagnosis present

## 2022-06-08 DIAGNOSIS — J810 Acute pulmonary edema: Secondary | ICD-10-CM | POA: Diagnosis present

## 2022-06-08 DIAGNOSIS — N2581 Secondary hyperparathyroidism of renal origin: Secondary | ICD-10-CM | POA: Diagnosis present

## 2022-06-08 DIAGNOSIS — Z20822 Contact with and (suspected) exposure to covid-19: Secondary | ICD-10-CM | POA: Diagnosis present

## 2022-06-08 DIAGNOSIS — Z79899 Other long term (current) drug therapy: Secondary | ICD-10-CM

## 2022-06-08 DIAGNOSIS — I120 Hypertensive chronic kidney disease with stage 5 chronic kidney disease or end stage renal disease: Secondary | ICD-10-CM | POA: Diagnosis present

## 2022-06-08 DIAGNOSIS — E785 Hyperlipidemia, unspecified: Secondary | ICD-10-CM | POA: Diagnosis present

## 2022-06-08 DIAGNOSIS — R7989 Other specified abnormal findings of blood chemistry: Secondary | ICD-10-CM | POA: Diagnosis present

## 2022-06-08 DIAGNOSIS — A419 Sepsis, unspecified organism: Principal | ICD-10-CM

## 2022-06-08 DIAGNOSIS — L89216 Pressure-induced deep tissue damage of right hip: Secondary | ICD-10-CM | POA: Diagnosis present

## 2022-06-08 DIAGNOSIS — E8809 Other disorders of plasma-protein metabolism, not elsewhere classified: Secondary | ICD-10-CM | POA: Diagnosis present

## 2022-06-08 DIAGNOSIS — K219 Gastro-esophageal reflux disease without esophagitis: Secondary | ICD-10-CM | POA: Diagnosis present

## 2022-06-08 DIAGNOSIS — E872 Acidosis, unspecified: Secondary | ICD-10-CM | POA: Diagnosis present

## 2022-06-08 DIAGNOSIS — L89896 Pressure-induced deep tissue damage of other site: Secondary | ICD-10-CM | POA: Diagnosis present

## 2022-06-08 DIAGNOSIS — Z515 Encounter for palliative care: Secondary | ICD-10-CM | POA: Diagnosis not present

## 2022-06-08 DIAGNOSIS — I69354 Hemiplegia and hemiparesis following cerebral infarction affecting left non-dominant side: Secondary | ICD-10-CM | POA: Diagnosis not present

## 2022-06-08 DIAGNOSIS — I745 Embolism and thrombosis of iliac artery: Secondary | ICD-10-CM | POA: Diagnosis present

## 2022-06-08 DIAGNOSIS — D72819 Decreased white blood cell count, unspecified: Secondary | ICD-10-CM | POA: Diagnosis not present

## 2022-06-08 DIAGNOSIS — L89220 Pressure ulcer of left hip, unstageable: Secondary | ICD-10-CM | POA: Diagnosis present

## 2022-06-08 DIAGNOSIS — G9341 Metabolic encephalopathy: Secondary | ICD-10-CM | POA: Diagnosis present

## 2022-06-08 DIAGNOSIS — Y95 Nosocomial condition: Secondary | ICD-10-CM | POA: Diagnosis present

## 2022-06-08 DIAGNOSIS — A4189 Other specified sepsis: Principal | ICD-10-CM | POA: Diagnosis present

## 2022-06-08 DIAGNOSIS — R400 Somnolence: Secondary | ICD-10-CM | POA: Diagnosis not present

## 2022-06-08 DIAGNOSIS — E876 Hypokalemia: Secondary | ICD-10-CM | POA: Diagnosis not present

## 2022-06-08 DIAGNOSIS — E875 Hyperkalemia: Secondary | ICD-10-CM | POA: Diagnosis not present

## 2022-06-08 DIAGNOSIS — Z66 Do not resuscitate: Secondary | ICD-10-CM | POA: Diagnosis not present

## 2022-06-08 DIAGNOSIS — Z681 Body mass index (BMI) 19 or less, adult: Secondary | ICD-10-CM

## 2022-06-08 DIAGNOSIS — I959 Hypotension, unspecified: Secondary | ICD-10-CM | POA: Diagnosis not present

## 2022-06-08 DIAGNOSIS — N186 End stage renal disease: Secondary | ICD-10-CM | POA: Diagnosis present

## 2022-06-08 DIAGNOSIS — L89320 Pressure ulcer of left buttock, unstageable: Secondary | ICD-10-CM | POA: Diagnosis present

## 2022-06-08 DIAGNOSIS — E43 Unspecified severe protein-calorie malnutrition: Secondary | ICD-10-CM | POA: Diagnosis present

## 2022-06-08 DIAGNOSIS — R6520 Severe sepsis without septic shock: Secondary | ICD-10-CM | POA: Diagnosis present

## 2022-06-08 DIAGNOSIS — E8770 Fluid overload, unspecified: Secondary | ICD-10-CM | POA: Diagnosis present

## 2022-06-08 DIAGNOSIS — R1310 Dysphagia, unspecified: Secondary | ICD-10-CM | POA: Diagnosis present

## 2022-06-08 DIAGNOSIS — R627 Adult failure to thrive: Secondary | ICD-10-CM | POA: Diagnosis not present

## 2022-06-08 DIAGNOSIS — Z7189 Other specified counseling: Secondary | ICD-10-CM | POA: Diagnosis not present

## 2022-06-08 DIAGNOSIS — Z7982 Long term (current) use of aspirin: Secondary | ICD-10-CM

## 2022-06-08 DIAGNOSIS — Z91158 Patient's noncompliance with renal dialysis for other reason: Secondary | ICD-10-CM

## 2022-06-08 DIAGNOSIS — J9801 Acute bronchospasm: Secondary | ICD-10-CM | POA: Diagnosis present

## 2022-06-08 DIAGNOSIS — Z7401 Bed confinement status: Secondary | ICD-10-CM

## 2022-06-08 DIAGNOSIS — J101 Influenza due to other identified influenza virus with other respiratory manifestations: Secondary | ICD-10-CM

## 2022-06-08 DIAGNOSIS — F17210 Nicotine dependence, cigarettes, uncomplicated: Secondary | ICD-10-CM | POA: Diagnosis present

## 2022-06-08 DIAGNOSIS — J111 Influenza due to unidentified influenza virus with other respiratory manifestations: Secondary | ICD-10-CM | POA: Diagnosis not present

## 2022-06-08 DIAGNOSIS — R0602 Shortness of breath: Secondary | ICD-10-CM | POA: Diagnosis present

## 2022-06-08 DIAGNOSIS — Z7902 Long term (current) use of antithrombotics/antiplatelets: Secondary | ICD-10-CM

## 2022-06-08 LAB — CBC WITH DIFFERENTIAL/PLATELET
Abs Immature Granulocytes: 0.06 10*3/uL (ref 0.00–0.07)
Basophils Absolute: 0.1 10*3/uL (ref 0.0–0.1)
Basophils Relative: 1 %
Eosinophils Absolute: 0.1 10*3/uL (ref 0.0–0.5)
Eosinophils Relative: 1 %
HCT: 36.5 % — ABNORMAL LOW (ref 39.0–52.0)
Hemoglobin: 10.5 g/dL — ABNORMAL LOW (ref 13.0–17.0)
Immature Granulocytes: 1 %
Lymphocytes Relative: 9 %
Lymphs Abs: 1.2 10*3/uL (ref 0.7–4.0)
MCH: 29.1 pg (ref 26.0–34.0)
MCHC: 28.8 g/dL — ABNORMAL LOW (ref 30.0–36.0)
MCV: 101.1 fL — ABNORMAL HIGH (ref 80.0–100.0)
Monocytes Absolute: 0.4 10*3/uL (ref 0.1–1.0)
Monocytes Relative: 3 %
Neutro Abs: 11.4 10*3/uL — ABNORMAL HIGH (ref 1.7–7.7)
Neutrophils Relative %: 85 %
Platelets: 245 10*3/uL (ref 150–400)
RBC: 3.61 MIL/uL — ABNORMAL LOW (ref 4.22–5.81)
RDW: 15.2 % (ref 11.5–15.5)
WBC: 13.2 10*3/uL — ABNORMAL HIGH (ref 4.0–10.5)
nRBC: 0 % (ref 0.0–0.2)

## 2022-06-08 LAB — COMPREHENSIVE METABOLIC PANEL
ALT: 20 U/L (ref 0–44)
AST: 46 U/L — ABNORMAL HIGH (ref 15–41)
Albumin: 3.8 g/dL (ref 3.5–5.0)
Alkaline Phosphatase: 72 U/L (ref 38–126)
Anion gap: 19 — ABNORMAL HIGH (ref 5–15)
BUN: 44 mg/dL — ABNORMAL HIGH (ref 6–20)
CO2: 20 mmol/L — ABNORMAL LOW (ref 22–32)
Calcium: 9.6 mg/dL (ref 8.9–10.3)
Chloride: 99 mmol/L (ref 98–111)
Creatinine, Ser: 6.56 mg/dL — ABNORMAL HIGH (ref 0.61–1.24)
GFR, Estimated: 9 mL/min — ABNORMAL LOW (ref >60)
Glucose, Bld: 57 mg/dL — ABNORMAL LOW (ref 70–99)
Potassium: 6.6 mmol/L (ref 3.5–5.1)
Sodium: 138 mmol/L (ref 135–145)
Total Bilirubin: 0.6 mg/dL (ref 0.3–1.2)
Total Protein: 8.4 g/dL — ABNORMAL HIGH (ref 6.5–8.1)

## 2022-06-08 LAB — LACTIC ACID, PLASMA
Lactic Acid, Venous: 4.3 mmol/L (ref 0.5–1.9)
Lactic Acid, Venous: 2.9 mmol/L (ref 0.5–1.9)

## 2022-06-08 LAB — I-STAT BETA HCG BLOOD, ED (MC, WL, AP ONLY): I-stat hCG, quantitative: <5 m[IU]/mL (ref <5)

## 2022-06-08 LAB — RESP PANEL BY RT-PCR (FLU A&B, COVID) ARPGX2
Influenza A by PCR: POSITIVE — AB
Influenza B by PCR: NEGATIVE
SARS Coronavirus 2 by RT PCR: NEGATIVE

## 2022-06-08 LAB — MRSA NEXT GEN BY PCR, NASAL: MRSA by PCR Next Gen: NOT DETECTED

## 2022-06-08 LAB — APTT: aPTT: 20 seconds — ABNORMAL LOW (ref 24–36)

## 2022-06-08 LAB — PROTIME-INR
INR: 1.3 — ABNORMAL HIGH (ref 0.8–1.2)
Prothrombin Time: 15.9 seconds — ABNORMAL HIGH (ref 11.4–15.2)

## 2022-06-08 LAB — GLUCOSE, CAPILLARY: Glucose-Capillary: 69 mg/dL — ABNORMAL LOW (ref 70–99)

## 2022-06-08 MED ORDER — ACETAMINOPHEN 650 MG RE SUPP
650.0000 mg | Freq: Once | RECTAL | Status: AC
  Administered 2022-06-08: 650 mg via RECTAL
  Filled 2022-06-08: qty 1

## 2022-06-08 MED ORDER — NITROGLYCERIN IN D5W 200-5 MCG/ML-% IV SOLN
0.0000 ug/min | INTRAVENOUS | Status: AC
  Administered 2022-06-08: 5 ug/min via INTRAVENOUS
  Filled 2022-06-08: qty 250

## 2022-06-08 MED ORDER — ALTEPLASE 2 MG IJ SOLR
2.0000 mg | Freq: Once | INTRAMUSCULAR | Status: AC | PRN
  Administered 2022-06-09: 2 mg
  Filled 2022-06-08 (×2): qty 2

## 2022-06-08 MED ORDER — DEXTROSE 50 % IV SOLN
INTRAVENOUS | Status: AC
  Filled 2022-06-08: qty 50

## 2022-06-08 MED ORDER — ALBUTEROL (5 MG/ML) CONTINUOUS INHALATION SOLN
5.0000 mg/h | INHALATION_SOLUTION | Freq: Once | RESPIRATORY_TRACT | Status: DC
  Filled 2022-06-08: qty 20

## 2022-06-08 MED ORDER — ALTEPLASE 2 MG IJ SOLR
2.0000 mg | Freq: Once | INTRAMUSCULAR | Status: AC
  Administered 2022-06-09: 2 mg
  Filled 2022-06-08 (×2): qty 2

## 2022-06-08 MED ORDER — SODIUM CHLORIDE 0.9 % IV SOLN
2.0000 g | Freq: Once | INTRAVENOUS | Status: AC
  Administered 2022-06-08: 2 g via INTRAVENOUS
  Filled 2022-06-08: qty 12.5

## 2022-06-08 MED ORDER — ORAL CARE MOUTH RINSE: 15.0000 mL | OROMUCOSAL | Status: DC | PRN

## 2022-06-08 MED ORDER — SODIUM ZIRCONIUM CYCLOSILICATE 10 G PO PACK
10.0000 g | PACK | Freq: Once | ORAL | Status: AC
  Administered 2022-06-08: 10 g via ORAL
  Filled 2022-06-08: qty 1

## 2022-06-08 MED ORDER — ORAL CARE MOUTH RINSE
15.0000 mL | OROMUCOSAL | Status: DC
  Administered 2022-06-08 – 2022-07-08 (×100): 15 mL via OROMUCOSAL

## 2022-06-08 MED ORDER — HEPARIN SODIUM (PORCINE) 5000 UNIT/ML IJ SOLN
5000.0000 [IU] | Freq: Three times a day (TID) | INTRAMUSCULAR | Status: DC
  Administered 2022-06-08 – 2022-06-30 (×61): 5000 [IU] via SUBCUTANEOUS
  Filled 2022-06-08 (×57): qty 1

## 2022-06-08 MED ORDER — DOCUSATE SODIUM 100 MG PO CAPS
100.0000 mg | ORAL_CAPSULE | Freq: Two times a day (BID) | ORAL | Status: DC | PRN
  Administered 2022-06-22 – 2022-06-23 (×2): 100 mg via ORAL
  Filled 2022-06-08 (×2): qty 1

## 2022-06-08 MED ORDER — VANCOMYCIN HCL IN DEXTROSE 1-5 GM/200ML-% IV SOLN
1000.0000 mg | Freq: Once | INTRAVENOUS | Status: AC
  Administered 2022-06-08: 1000 mg via INTRAVENOUS
  Filled 2022-06-08: qty 200

## 2022-06-08 MED ORDER — ALBUTEROL SULFATE (2.5 MG/3ML) 0.083% IN NEBU
2.5000 mg | INHALATION_SOLUTION | RESPIRATORY_TRACT | Status: DC | PRN
  Administered 2022-06-13: 2.5 mg via RESPIRATORY_TRACT
  Filled 2022-06-08: qty 3

## 2022-06-08 MED ORDER — SODIUM CHLORIDE 0.9 % IV SOLN
1.0000 g | INTRAVENOUS | Status: DC
  Administered 2022-06-09: 1 g via INTRAVENOUS
  Filled 2022-06-08 (×3): qty 10

## 2022-06-08 MED ORDER — HEPARIN SODIUM (PORCINE) 1000 UNIT/ML DIALYSIS
1000.0000 [IU] | INTRAMUSCULAR | Status: DC | PRN
  Administered 2022-06-09: 1000 [IU]
  Filled 2022-06-08: qty 1

## 2022-06-08 MED ORDER — VANCOMYCIN VARIABLE DOSE PER UNSTABLE RENAL FUNCTION (PHARMACIST DOSING): Status: DC

## 2022-06-08 MED ORDER — METHYLPREDNISOLONE SODIUM SUCC 125 MG IJ SOLR
125.0000 mg | Freq: Once | INTRAMUSCULAR | Status: AC
  Administered 2022-06-08: 125 mg via INTRAVENOUS
  Filled 2022-06-08: qty 2

## 2022-06-08 MED ORDER — LACTATED RINGERS IV SOLN: INTRAVENOUS | Status: AC

## 2022-06-08 MED ORDER — CHLORHEXIDINE GLUCONATE CLOTH 2 % EX PADS
6.0000 | MEDICATED_PAD | Freq: Every day | CUTANEOUS | Status: DC
  Administered 2022-06-09 – 2022-06-13 (×5): 6 via TOPICAL

## 2022-06-08 MED ORDER — LACTATED RINGERS IV BOLUS (SEPSIS)
1000.0000 mL | Freq: Once | INTRAVENOUS | Status: AC
  Administered 2022-06-08: 1000 mL via INTRAVENOUS

## 2022-06-08 MED ORDER — ANTICOAGULANT SODIUM CITRATE 4% (200MG/5ML) IV SOLN: 5.0000 mL | Status: DC | PRN

## 2022-06-08 MED ORDER — OSELTAMIVIR PHOSPHATE 30 MG PO CAPS
30.0000 mg | ORAL_CAPSULE | Freq: Once | ORAL | Status: DC
  Filled 2022-06-08: qty 1

## 2022-06-08 MED ORDER — POLYETHYLENE GLYCOL 3350 17 G PO PACK: 17.0000 g | PACK | Freq: Every day | ORAL | Status: DC | PRN

## 2022-06-08 MED ORDER — LABETALOL HCL 5 MG/ML IV SOLN
10.0000 mg | INTRAVENOUS | Status: DC | PRN
  Administered 2022-06-08 (×2): 10 mg via INTRAVENOUS
  Filled 2022-06-08 (×3): qty 4

## 2022-06-08 MED ORDER — ACETAMINOPHEN 500 MG PO TABS: 1000.0000 mg | ORAL_TABLET | Freq: Once | ORAL | Status: DC

## 2022-06-08 MED ORDER — LACTATED RINGERS IV BOLUS (SEPSIS): 500.0000 mL | Freq: Once | INTRAVENOUS | Status: DC

## 2022-06-08 MED ORDER — HYDRALAZINE HCL 20 MG/ML IJ SOLN
10.0000 mg | INTRAMUSCULAR | Status: DC | PRN
  Administered 2022-06-08 – 2022-06-09 (×3): 20 mg via INTRAVENOUS
  Filled 2022-06-08: qty 2
  Filled 2022-06-08 (×2): qty 1

## 2022-06-08 NOTE — ED Notes (Signed)
[facility] given an update on pt

## 2022-06-08 NOTE — ED Provider Notes (Signed)
[facility] DEPT Provider Note   CSN: [id number] Arrival date & time: 06/08/22  1034     History  Chief Complaint  Patient presents with   Shortness of Breath    Cody Macdonald is a 61 y.o. male.  HPI Presents from his nursing facility with concern of respiratory distress.  History is notable for multiple medical issues including prior stroke, end-stage renal disease, pulmonary edema, and hospitalization September of this year after an episode of similar concern. Level 5 caveat secondary to acute of condition.  Per EMS the patient was hypoxic, tachycardic, tachypneic, but not hypotensive on their arrival.  He required supplemental oxygen in route.    Home Medications Prior to Admission medications   Medication Sig Start Date End Date Taking? Authorizing Provider  acetaminophen (TYLENOL) 325 MG tablet Take 650 mg by mouth every 6 (six) hours as needed for mild pain.   Yes [provider]  albuterol (PROVENTIL) (2.5 MG/3ML) 0.083% nebulizer solution Take 3 mLs (2.5 mg total) by nebulization every 6 (six) hours as needed for wheezing or shortness of breath. 01/01/22  Yes Ghimire, Henreitta Leber, MD  amLODipine (NORVASC) 10 MG tablet Take 1 tablet (10 mg total) by mouth daily. 01/01/22  Yes Ghimire, Henreitta Leber, MD  aspirin EC 81 MG tablet Take 81 mg by mouth daily. Swallow whole.   Yes [provider]  atorvastatin (LIPITOR) 80 MG tablet Take 80 mg by mouth at bedtime. 12/22/21  Yes [provider]  clopidogrel (PLAVIX) 75 MG tablet Take 75 mg by mouth daily. 12/20/21  Yes [provider]  hydrALAZINE (APRESOLINE) 50 MG tablet Take 1 tablet (50 mg total) by mouth every 8 (eight) hours. Patient taking differently: Take 50 mg by mouth 3 (three) times daily. 01/01/22  Yes Ghimire, Henreitta Leber, MD  labetalol (NORMODYNE) 300 MG tablet Take 1 tablet (300 mg total) by mouth 2 (two) times daily. 01/01/22  Yes Ghimire, Henreitta Leber, MD  melatonin 3  MG TABS tablet Take 3 mg by mouth at bedtime.   Yes [provider]  MULTIPLE VITAMIN PO Take 1 tablet by mouth at bedtime.   Yes [provider]  multivitamin (RENA-VIT) TABS tablet Take 1 tablet by mouth at bedtime. 01/01/22  Yes Ghimire, Henreitta Leber, MD  Nutritional Supplements (NEPRO PO) Take by mouth in the morning and at bedtime. AHR Nepro   Yes [provider]  pantoprazole (PROTONIX) 40 MG tablet Take 40 mg by mouth daily. 12/20/21  Yes [provider]  sevelamer carbonate (RENVELA) 800 MG tablet Take 1 tablet (800 mg total) by mouth 3 (three) times daily with meals. Patient taking differently: Take 1,600 mg by mouth 3 (three) times daily with meals. 01/01/22  Yes Ghimire, Henreitta Leber, MD  UNABLE TO FIND Take 30 mLs by mouth in the morning and at bedtime. Liquid protein   Yes [provider]  ergocalciferol (VITAMIN D2) 1.25 MG (50000 UT) capsule Take 50,000 Units by mouth every Tuesday. Patient not taking: Reported on 06/08/2022    [provider]  feeding supplement (ENSURE ENLIVE / ENSURE PLUS) LIQD Take 237 mLs by mouth 2 (two) times daily between meals. Patient not taking: Reported on 04/04/2022 01/01/22   Jonetta Osgood, MD      Allergies    Patient has no known allergies.    Review of Systems   Review of Systems  Unable to perform ROS: Acuity of condition    Physical Exam Updated Vital  Signs BP (!) 184/89   Pulse 98   Temp 98.4 F (36.9 C) (Oral)   Resp (!) 49   Wt 48.1 kg   SpO2 100%   BMI 16.12 kg/m  Physical Exam Vitals and nursing note reviewed.  Constitutional:      General: He is in acute distress.     Appearance: He is diaphoretic.  HENT:     Head: Normocephalic and atraumatic.  Eyes:     Conjunctiva/sclera: Conjunctivae normal.  Cardiovascular:     Rate and Rhythm: Regular rhythm. Tachycardia present.  Pulmonary:     Effort: Tachypnea and respiratory distress present.     Breath sounds: Decreased breath  sounds, rhonchi and rales present.  Chest:    Abdominal:     General: There is no distension.  Skin:    General: Skin is warm.  Neurological:     Mental Status: He is alert.     Motor: Weakness and atrophy present.     Comments: Follows commands, speaks only occasionally, briefly, but clearly.  Known left-sided hemiplegia following stroke.     ED Results / Procedures / Treatments   Labs (all labs ordered are listed, but only abnormal results are displayed) Labs Reviewed  RESP PANEL BY RT-PCR (FLU A&B, COVID) ARPGX2 - Abnormal; Notable for the following components:      Result Value   Influenza A by PCR POSITIVE (*)    All other components within normal limits  COMPREHENSIVE METABOLIC PANEL - Abnormal; Notable for the following components:   Potassium 6.6 (*)    CO2 20 (*)    Glucose, Bld 57 (*)    BUN 44 (*)    Creatinine, Ser 6.56 (*)    Total Protein 8.4 (*)    AST 46 (*)    GFR, Estimated 9 (*)    Anion gap 19 (*)    All other components within normal limits  LACTIC ACID, PLASMA - Abnormal; Notable for the following components:   Lactic Acid, Venous 4.3 (*)    All other components within normal limits  LACTIC ACID, PLASMA - Abnormal; Notable for the following components:   Lactic Acid, Venous 2.9 (*)    All other components within normal limits  CBC WITH DIFFERENTIAL/PLATELET - Abnormal; Notable for the following components:   WBC 13.2 (*)    RBC 3.61 (*)    Hemoglobin 10.5 (*)    HCT 36.5 (*)    MCV 101.1 (*)    MCHC 28.8 (*)    Neutro Abs 11.4 (*)    All other components within normal limits  PROTIME-INR - Abnormal; Notable for the following components:   Prothrombin Time 15.9 (*)    INR 1.3 (*)    All other components within normal limits  APTT - Abnormal; Notable for the following components:   aPTT 20 (*)    All other components within normal limits  CULTURE, BLOOD (ROUTINE X 2)  CULTURE, BLOOD (ROUTINE X 2)  HEPATITIS B SURFACE ANTIGEN  HEPATITIS  B SURFACE ANTIBODY, QUANTITATIVE  CBC  BASIC METABOLIC PANEL  MAGNESIUM  PHOSPHORUS  I-STAT CHEM 8, ED  I-STAT BETA HCG BLOOD, ED ([facility], [facility], [facility] ONLY)    EKG EKG Interpretation  Date/Time:  Tuesday June 08 2022 11:32:10 EST Ventricular Rate:  106 PR Interval:  166 QRS Duration: 90 QT Interval:  353 QTC Calculation: 469 R Axis:   -49 Text Interpretation: Sinus tachycardia Probable left atrial enlargement LAD, consider left anterior fascicular block Left  ventricular hypertrophy Artifact Abnormal ECG Confirmed by Carmin Muskrat [phone]) on 06/08/2022 11:57:02 AM  Radiology DG Chest Port 1 View  Result Date: 06/08/2022 CLINICAL DATA:  Sepsis.  Shortness of breath. EXAM: PORTABLE CHEST 1 VIEW COMPARISON:  05/26/2022 FINDINGS: There is a right chest wall dialysis catheter with tips at the superior cavoatrial junction. Stable cardiomediastinal contours. There is a small right pleural effusion. Mild diffuse interstitial edema identified. Bilateral airspace opacities are identified which may represent areas of superimposed pneumonia or asymmetric pulmonary edema. Visualized osseous structures are unremarkable. IMPRESSION: 1. Small right pleural effusion and diffuse interstitial edema. 2. Bilateral airspace opacities compatible with superimposed pneumonia or asymmetric pulmonary edema. Electronically Signed   By: Kerby Moors M.D.   On: 06/08/2022 12:20    Procedures Procedures    Medications Ordered in ED Medications  lactated ringers infusion ( Intravenous New Bag/Given 06/08/22 1420)  acetaminophen (TYLENOL) tablet 1,000 mg (1,000 mg Oral Not Given 06/08/22 1204)  labetalol (NORMODYNE) injection 10 mg (10 mg Intravenous Given 06/08/22 1405)  Chlorhexidine Gluconate Cloth 2 % PADS 6 each (has no administration in time range)  heparin injection 5,000 Units (has no administration in time range)  docusate sodium (COLACE) capsule 100 mg (has no administration in time range)  polyethylene  glycol (MIRALAX / GLYCOLAX) packet 17 g (has no administration in time range)  albuterol (PROVENTIL) (2.5 MG/3ML) 0.083% nebulizer solution 2.5 mg (has no administration in time range)  hydrALAZINE (APRESOLINE) injection 10-40 mg (has no administration in time range)  ceFEPIme (MAXIPIME) 1 g in sodium chloride 0.9 % 100 mL IVPB (has no administration in time range)  vancomycin variable dose per unstable renal function (pharmacist dosing) (has no administration in time range)  lactated ringers bolus 1,000 mL (0 mLs Intravenous Stopped 06/08/22 1209)  vancomycin (VANCOCIN) IVPB 1000 mg/200 mL premix (0 mg Intravenous Stopped 06/08/22 1329)  ceFEPIme (MAXIPIME) 2 g in sodium chloride 0.9 % 100 mL IVPB (0 g Intravenous Stopped 06/08/22 1225)  acetaminophen (TYLENOL) suppository 650 mg (650 mg Rectal Given 06/08/22 1211)  sodium zirconium cyclosilicate (LOKELMA) packet 10 g (10 g Oral Given 06/08/22 1434)  methylPREDNISolone sodium succinate (SOLU-MEDROL) 125 mg/2 mL injection 125 mg (125 mg Intravenous Given 06/08/22 1652)    ED Course/ Medical Decision Making/ A&P This patient with a Hx of stroke, end-stage renal disease, aspiration pneumonia, hospitalization 2 months ago presents to the ED for concern of respiratory distress, this involves an extensive number of treatment options, and is a complaint that carries with it a high risk of complications and morbidity.    The differential diagnosis includes pneumonia, sepsis, bacteremia, heart failure   Social Determinants of Health:  End-stage renal disease, nursing home residency, effective advanced age  Additional history obtained:  Additional history and/or information obtained from chart review discharge summary from 2 months ago reviewed, as well as discharge summary from June of this year, notable for ongoing efforts to address the patient's chronic kidney disease, and aspiration risk  After the initial evaluation, orders, including: Empiric  broad-spectrum antibiotics were initiated.   Patient placed on Cardiac and Pulse-Oximetry Monitors. The patient was maintained on a cardiac monitor.  The cardiac monitored showed an rhythm of 120 sinus tach abnormal The patient was also maintained on pulse oximetry. The readings were typically 80% Ventimask abnormal   On repeat evaluation of the patient improved 5:07 PM On my initial reassessment patient is transition to high flow nasal cannula saturation is now 96%.  He is sitting  upright, heart rate has diminished, work of breathing has diminished.  He is now accompanied by his son.  At bedside we discussed his presentation, concern for sepsis, initiation of broad-spectrum antibiotics, need for admission.  Lab Tests:  I personally interpreted labs.  The pertinent results include: Leukocytosis lactic acidosis, hyperkalemia.  Imaging Studies ordered:  I independently visualized and interpreted imaging which showed pulmonary congestion I agree with the radiologist interpretation  Consultations Obtained:  I requested consultation with the critical care, nephrology,  and discussed lab and imaging findings as well as pertinent plan - they recommend: Admission, dialysis  Dispostion / Final MDM:  After consideration of the diagnostic results and the patient's response to treatment, old male with multiple medical issues, now presents with dyspnea, fatigue, soundly hypertensive, tachycardic, febrile.  Patient received empiric therapy for sepsis, was found to have evidence for influenza.  Notably patient also found to have hyperkalemia, and given his end-stage renal disease case required conversation with our nephrologists in addition to critical care.  Anticipated ICU admission with dialysis at our affiliated facility after discussion with Dr. Chase Caller and Dr. Posey Pronto, ICU, nephrology respectively. Following resuscitation the patient's heart rate did diminish with fluids, antibiotics.  Final  Clinical Impression(s) / ED Diagnoses Final diagnoses:  Severe sepsis ([place])  Influenza  Hyperkalemia   CRITICAL CARE Performed by: Carmin Muskrat Total critical care time: 50 minutes Critical care time was exclusive of separately billable procedures and treating other patients. Critical care was necessary to treat or prevent imminent or life-threatening deterioration. Critical care was time spent personally by me on the following activities: development of treatment plan with patient and/or surrogate as well as nursing, discussions with consultants, evaluation of patient's response to treatment, examination of patient, obtaining history from patient or surrogate, ordering and performing treatments and interventions, ordering and review of laboratory studies, ordering and review of radiographic studies, pulse oximetry and re-evaluation of patient's condition.    Carmin Muskrat, MD 06/08/22 [phone]

## 2022-06-08 NOTE — Progress Notes (Signed)
RT called to patients room for patient desat and distress. Patient on 40L HNFN and NRB. RT initiated BIPAP per RCP protocol. MD and bedside RN aware.

## 2022-06-08 NOTE — Progress Notes (Signed)
Patient arrived to unit at 1850. Report received from [place], [place] of Cody Macdonald, ED. Patient on 15 HF [place] at time of arrival, c/o SOB and breathing in the 30's. Patient wiped down with CHG wipes & hooked up to our monitors. Respiratory currently at bedside hooking patient back up to oxygen. Report given to nightshift RN, Aaron Edelman.

## 2022-06-08 NOTE — Progress Notes (Signed)
Pharmacy Antibiotic Note  Cody Macdonald is a 61 y.o. male admitted on 06/08/2022 with  Flu +/- Sepsis and PNA .  Pharmacy has been consulted for Vanco, Cefepime dosing.   ID: +Flu A with r/o sepsis. 11/7: CXR: Bilateral airspace opacities compatible with superimposed pneumonia or asymmetric pulmonary edema. - Tmax 101.6. WBC 13.2. Scr 6.56 - LA 4.3>2.9  Vanco 11/7>> Cefepime 11/7>>  Plan: Height/weight Order entered Cefepime 1g IV q24h (may change to 2g/HD when schedule established) Vanco 1g IV x 1 already given in ED. Hold further to determinie HD schedule    Weight: 48.1 kg (106 lb)  Temp (24hrs), Avg:100 F (37.8 C), Min:98.4 F (36.9 C), Max:101.6 F (38.7 C)  Recent Labs  Lab 06/08/22 1132 06/08/22 1422  WBC 13.2*  --   CREATININE 6.56*  --   LATICACIDVEN 4.3* 2.9*    Estimated Creatinine Clearance: 8.1 mL/min (A) (by C-G formula based on SCr of 6.56 mg/dL (H)).    No Known Allergies  Cody Macdonald, PharmD, BCPS Clinical Staff Pharmacist [email or web address]  Wayland Salinas 06/08/2022 4:09 PM

## 2022-06-08 NOTE — Progress Notes (Signed)
A consult was received from an ED physician for cefepime + vancomycin per pharmacy dosing.  The patient's profile has been reviewed for ht/wt/allergies/indication/available labs.  Ht/Wt order entered - no weight charted. Will use recorded total weight of 48 kg from 04/14/22 for initial code sepsis dosing purposes.   A one time order has been placed for vancomycin 1000 mg + cefepime 2 g IV once.    Further antibiotics/pharmacy consults should be ordered by admitting physician if indicated.                       Thank you, Lenis Noon, PharmD 06/08/2022  11:06 AM

## 2022-06-08 NOTE — ED Triage Notes (Signed)
Ems brings in from [place] for shortness of breath. Staff states pt started feeling short of breath this morning. Pt denies pain.

## 2022-06-08 NOTE — Progress Notes (Signed)
eLink Physician-Brief Progress Note Patient Name: Cody Macdonald DOB: [birth year] MRN: [id number]   Date of Service  06/08/2022  HPI/Events of Note  Persistent hypertension likely driven by expanded circulating blood volume in ESRD patient who is awaiting urgent dialysis, will start Nitroglycerine gtt pending dialysis, as patient is also on BIPAP due to respiratory distress.  eICU Interventions  Nitro gtt ordered.        [name] [name] 06/08/2022, 10:02 PM

## 2022-06-08 NOTE — Progress Notes (Signed)
Pharmacy Antibiotic Note  Cody Macdonald is a 61 y.o. male admitted on 06/08/2022 with  Flu +/- Sepsis and PNA .  Pharmacy has been consulted for Vanco, Cefepime and Tamiflu dosing. He is noted with ESRD for HD tonight  Vanco 11/7>> Cefepime 11/7>> Tamiflu 11/7  Plan: -Tamiflu 30mg  po x1 post HD  -Vancomycin 500mg  IV post HD -Continue Cefepime 1g IV q24h (may change to 2g/HD when schedule established) -Will follow HD plans for further dosing of Tamiflu and vancomycin    Weight: 48.1 kg (106 lb)  Temp (24hrs), Avg:98.9 F (37.2 C), Min:97.6 F (36.4 C), Max:101.6 F (38.7 C)  Recent Labs  Lab 06/08/22 1132 06/08/22 1422  WBC 13.2*  --   CREATININE 6.56*  --   LATICACIDVEN 4.3* 2.9*     Estimated Creatinine Clearance: 8.1 mL/min (A) (by C-G formula based on SCr of 6.56 mg/dL (H)).    No Known Allergies  Hildred Laser, PharmD Clinical Pharmacist **Pharmacist phone directory can now be found on [email or web address] ([email or web address]).  Listed under [place].

## 2022-06-08 NOTE — ED Notes (Signed)
NT, [facility], attempted to obtain UA sample. Pt states he doesn't need to urinate att. Family at bedside informed NT pt has hard time using the bathroom. RN aware

## 2022-06-08 NOTE — ED Notes (Signed)
[facility] called. 

## 2022-06-08 NOTE — H&P (Signed)
NAME:  Cody Macdonald, MRN:  [id number], DOB:  1961/05/10, LOS: 0 ADMISSION DATE:  06/08/2022, CONSULTATION DATE:  06/08/22 REFERRING MD:  Vanita Panda CHIEF COMPLAINT:  Dyspnea   History of Present Illness:  Cody Macdonald is a 61 y.o. male who has a PMH as below including but not limited to ESRD on HD MWF, presumed last session Mon 11/6.  He resides at [facility] and presented to [facility] ED 11/7 with dyspnea, hypertensive urgency, hyperkalemia. He required 10L HFNC in ED to maintain sats > 90%. CXR showed interstitial edema and he was diffusely bronchospastic. Flu A noted to be positive with negative COVID. SBP as high as 190. He was found to have K 6.6 for which nephrology was consulted and given his presentation with pulmonary edema, hyperkalemia, respiratory distress, it was felt that he would be best served with transfer to [facility] for emergent HD.  PCCM subsequently called for admission.  Pertinent  Medical History:  has ESRD (end stage renal disease) on dialysis ([place]); Symptomatic anemia; History of stroke; Tobacco abuse; Alcohol abuse; Hypoalbuminemia; AKI (acute kidney injury) ([place]); Abdominal distention; Leg edema; Malnutrition of moderate degree; Volume overload; Hypertension; Hyperkalemia; Pulmonary edema; and HCAP (healthcare-associated pneumonia) on their problem list.  Significant Hospital Events: Including procedures, antibiotic start and stop dates in addition to other pertinent events   11/7 > admit.  Interim History / Subjective:  On 10L HFNC. Sats 90%. Bronchospastic throughout. SBP in 180s. Received 1 dose 10mg  Labetalol at 1405.  Objective:  Blood pressure (!) 186/89, pulse 95, temperature 98.4 F (36.9 C), temperature source Oral, resp. rate (!) 47, weight 48.1 kg, SpO2 92 %.       No intake or output data in the 24 hours ending 06/08/22 1622 Filed Weights   06/08/22 1600  Weight: 48.1 kg    Examination: General: Adult male, in distress. Neuro: A&O x 3, some  generalized weakness. HEENT: [place]/AT. Sclerae anicteric. EOMI. Cardiovascular: Tachy, regular, no M/R/G.  Lungs: Respirations shallow and rapid. Wheezes bilaterally. Abdomen: BS x 4, soft, NT/ND.  Musculoskeletal: No gross deformities, no edema.  Skin: Intact, warm, no rashes.  Labs/imaging personally reviewed:  CXR 11/7 > edema bilaterally.   Assessment & Plan:   Hypertensive urgency - s/p 1 dose 10mg  Labetalol. - Continue Labetalol and Hydralazine PRN until can safely take PO then resume home meds. - Goal SBP 140. - Volume removal per HD upon arrival to [facility].  Pulmonary edema and bronchospasm. - Volume removal as above. - Solumedrol x 1. - CAT x 1 then PRN Albuterol.  Influenza A. Possible HCAP. - Holding Tamiflu for now as not safe for him to take PO at this point. - Empiric HCAP coverage for now. - Follow cultures.  Hx ESRD on HD MWF. Hyperkalemia. - Nephrology on board, planning for urgent HD upon arrival to [facility].  Hx CVA on DAPT. - Hold home ASA, Clopidogrel until safe to take PO.  Best practice (evaluated daily):  Diet/type: NPO DVT prophylaxis: prophylactic heparin  GI prophylaxis: N/A Lines: Dialysis Catheter - tunneled, chronic Foley:  N/A Code Status:  full code Last date of multidisciplinary goals of care discussion: None yet.  Labs   CBC: Recent Labs  Lab 06/08/22 1132  WBC 13.2*  NEUTROABS 11.4*  HGB 10.5*  HCT 36.5*  MCV 101.1*  PLT 151    Basic Metabolic Panel: Recent Labs  Lab 06/08/22 1132  NA 138  K 6.6*  CL 99  CO2 20*  GLUCOSE  57*  BUN 44*  CREATININE 6.56*  CALCIUM 9.6   GFR: Estimated Creatinine Clearance: 8.1 mL/min (A) (by C-G formula based on SCr of 6.56 mg/dL (H)). Recent Labs  Lab 06/08/22 1132 06/08/22 1422  WBC 13.2*  --   LATICACIDVEN 4.3* 2.9*    Liver Function Tests: Recent Labs  Lab 06/08/22 1132  AST 46*  ALT 20  ALKPHOS 72  BILITOT 0.6  PROT 8.4*  ALBUMIN 3.8   No results for input(s): "LIPASE",  "AMYLASE" in the last 168 hours. No results for input(s): "AMMONIA" in the last 168 hours.  ABG    Component Value Date/Time   HCO3 27.5 04/04/2022 0538   TCO2 29 04/04/2022 0538   ACIDBASEDEF 11.3 (H) 12/25/2021 0231   O2SAT 94 04/04/2022 0538     Coagulation Profile: Recent Labs  Lab 06/08/22 1132  INR 1.3*    Cardiac Enzymes: No results for input(s): "CKTOTAL", "CKMB", "CKMBINDEX", "TROPONINI" in the last 168 hours.  HbA1C: No results found for: "HGBA1C"  CBG: No results for input(s): "GLUCAP" in the last 168 hours.  Review of Systems:   All negative; except for those that are bolded, which indicate positives.  Constitutional: weight loss, weight gain, night sweats, fevers, chills, fatigue, weakness.  HEENT: headaches, sore throat, sneezing, nasal congestion, post nasal drip, difficulty swallowing, tooth/dental problems, visual complaints, visual changes, ear aches. Neuro: difficulty with speech, weakness, numbness, ataxia. CV:  chest pain, orthopnea, PND, swelling in lower extremities, dizziness, palpitations, syncope.  Resp: cough, hemoptysis, dyspnea, wheezing. GI: heartburn, indigestion, abdominal pain, nausea, vomiting, diarrhea, constipation, change in bowel habits, loss of appetite, hematemesis, melena, hematochezia.  GU: dysuria, change in color of urine, urgency or frequency, flank pain, hematuria. MSK: joint pain or swelling, decreased range of motion. Psych: change in mood or affect, depression, anxiety, suicidal ideations, homicidal ideations. Skin: rash, itching, bruising.   Past Medical History:  He,  has a past medical history of Chronic kidney disease, Hypertension, and Stroke ([place]).   Surgical History:   Past Surgical History:  Procedure Laterality Date   IR FLUORO GUIDE CV LINE RIGHT  12/25/2021   IR US GUIDE VASC ACCESS RIGHT  12/25/2021     Social History:   reports that he has been smoking cigarettes. He has been smoking an average of .25  packs per day. He has never used smokeless tobacco. He reports current alcohol use of about 1.0 standard drink of alcohol per week. He reports current drug use. Drug: Marijuana.   Family History:  His family history is not on file.   Allergies No Known Allergies   Home Medications  Prior to Admission medications   Medication Sig Start Date End Date Taking? Authorizing Provider  acetaminophen (TYLENOL) 325 MG tablet Take 650 mg by mouth every 6 (six) hours as needed for mild pain.   Yes [provider]  albuterol (PROVENTIL) (2.5 MG/3ML) 0.083% nebulizer solution Take 3 mLs (2.5 mg total) by nebulization every 6 (six) hours as needed for wheezing or shortness of breath. 01/01/22  Yes Ghimire, Henreitta Leber, MD  amLODipine (NORVASC) 10 MG tablet Take 1 tablet (10 mg total) by mouth daily. 01/01/22  Yes Ghimire, Henreitta Leber, MD  aspirin EC 81 MG tablet Take 81 mg by mouth daily. Swallow whole.   Yes [provider]  atorvastatin (LIPITOR) 80 MG tablet Take 80 mg by mouth at bedtime. 12/22/21  Yes [provider]  clopidogrel (PLAVIX) 75 MG tablet Take 75 mg by mouth daily.  12/20/21  Yes [provider]  hydrALAZINE (APRESOLINE) 50 MG tablet Take 1 tablet (50 mg total) by mouth every 8 (eight) hours. Patient taking differently: Take 50 mg by mouth 3 (three) times daily. 01/01/22  Yes Ghimire, Henreitta Leber, MD  labetalol (NORMODYNE) 300 MG tablet Take 1 tablet (300 mg total) by mouth 2 (two) times daily. 01/01/22  Yes Ghimire, Henreitta Leber, MD  melatonin 3 MG TABS tablet Take 3 mg by mouth at bedtime.   Yes [provider]  MULTIPLE VITAMIN PO Take 1 tablet by mouth at bedtime.   Yes [provider]  multivitamin (RENA-VIT) TABS tablet Take 1 tablet by mouth at bedtime. 01/01/22  Yes Ghimire, Henreitta Leber, MD  Nutritional Supplements (NEPRO PO) Take by mouth in the morning and at bedtime. AHR Nepro   Yes [provider]  pantoprazole (PROTONIX) 40 MG tablet Take  40 mg by mouth daily. 12/20/21  Yes [provider]  sevelamer carbonate (RENVELA) 800 MG tablet Take 1 tablet (800 mg total) by mouth 3 (three) times daily with meals. Patient taking differently: Take 1,600 mg by mouth 3 (three) times daily with meals. 01/01/22  Yes Ghimire, Henreitta Leber, MD  UNABLE TO FIND Take 30 mLs by mouth in the morning and at bedtime. Liquid protein   Yes [provider]  ergocalciferol (VITAMIN D2) 1.25 MG (50000 UT) capsule Take 50,000 Units by mouth every Tuesday. Patient not taking: Reported on 06/08/2022    [provider]  feeding supplement (ENSURE ENLIVE / ENSURE PLUS) LIQD Take 237 mLs by mouth 2 (two) times daily between meals. Patient not taking: Reported on 04/04/2022 01/01/22   Jonetta Osgood, MD     Critical care time: 40 min.   Montey Hora, [facility] For pager details, please see AMION or use Epic chat  After 1900, please call [facility] for cross coverage needs 06/08/2022, 4:22 PM

## 2022-06-08 NOTE — Progress Notes (Signed)
Patient ID: Cody Macdonald, male   DOB: [birth year], 61 y.o.   MRN: [id number]  Informed by Dr.Lockwood (EDP) about this ESRD patient with hyperkalemia being evaluated in the [facility] ER and awaiting transfer to [facility] for admission. Influenza positive but COVID-19 negative. Lokelma ordered as a temporizing measure for hyperkalemia and hemodialysis orders placed. We will see the patient and a formal/full consult note will follow his transfer here.   Elmarie Shiley MD [facility]. Office # [phone] Pager # [phone] 3:11 PM

## 2022-06-08 NOTE — Progress Notes (Signed)
RT placed pt on HHFNC at this time. 25L, 50%

## 2022-06-08 NOTE — Progress Notes (Signed)
eLink Physician-Brief Progress Note Patient Name: Cody Macdonald DOB: March 26, 1961 MRN: [id number]   Date of Service  06/08/2022  HPI/Events of Note  Patient admitted with pneumonia, pulmonary edema and acute respiratory failure requiring BIPAP, he has ESRD and was transferred from [place] to [facility] ICU so he can receive dialysis.  eICU Interventions  New Patient Evaluation.        [name] Neysa Arts 06/08/2022, 7:42 PM

## 2022-06-09 ENCOUNTER — Inpatient Hospital Stay (HOSPITAL_COMMUNITY): Payer: Medicaid Other

## 2022-06-09 DIAGNOSIS — J189 Pneumonia, unspecified organism: Secondary | ICD-10-CM | POA: Diagnosis not present

## 2022-06-09 DIAGNOSIS — J101 Influenza due to other identified influenza virus with other respiratory manifestations: Secondary | ICD-10-CM

## 2022-06-09 DIAGNOSIS — J9601 Acute respiratory failure with hypoxia: Secondary | ICD-10-CM

## 2022-06-09 LAB — GLUCOSE, CAPILLARY
Glucose-Capillary: 70 mg/dL (ref 70–99)
Glucose-Capillary: 87 mg/dL (ref 70–99)
Glucose-Capillary: 88 mg/dL (ref 70–99)
Glucose-Capillary: 81 mg/dL (ref 70–99)
Glucose-Capillary: 79 mg/dL (ref 70–99)
Glucose-Capillary: 78 mg/dL (ref 70–99)
Glucose-Capillary: 76 mg/dL (ref 70–99)
Glucose-Capillary: 79 mg/dL (ref 70–99)

## 2022-06-09 LAB — POCT I-STAT 7, (LYTES, BLD GAS, ICA,H+H)
Acid-Base Excess: 4 mmol/L — ABNORMAL HIGH (ref 0.0–2.0)
Bicarbonate: 25.5 mmol/L (ref 20.0–28.0)
Calcium, Ion: 1.09 mmol/L — ABNORMAL LOW (ref 1.15–1.40)
HCT: 33 % — ABNORMAL LOW (ref 39.0–52.0)
Hemoglobin: 11.2 g/dL — ABNORMAL LOW (ref 13.0–17.0)
O2 Saturation: 90 %
Patient temperature: 98.6
Potassium: 4.2 mmol/L (ref 3.5–5.1)
Sodium: 134 mmol/L — ABNORMAL LOW (ref 135–145)
TCO2: 26 mmol/L (ref 22–32)
pCO2 arterial: 28 mmHg — ABNORMAL LOW (ref 32–48)
pH, Arterial: 7.568 — ABNORMAL HIGH (ref 7.35–7.45)
pO2, Arterial: 50 mmHg — ABNORMAL LOW (ref 83–108)

## 2022-06-09 LAB — BASIC METABOLIC PANEL
Anion gap: 15 (ref 5–15)
BUN: 29 mg/dL — ABNORMAL HIGH (ref 6–20)
CO2: 24 mmol/L (ref 22–32)
Calcium: 9.1 mg/dL (ref 8.9–10.3)
Chloride: 97 mmol/L — ABNORMAL LOW (ref 98–111)
Creatinine, Ser: 3.51 mg/dL — ABNORMAL HIGH (ref 0.61–1.24)
GFR, Estimated: 19 mL/min — ABNORMAL LOW (ref >60)
Glucose, Bld: 86 mg/dL (ref 70–99)
Potassium: 3.8 mmol/L (ref 3.5–5.1)
Sodium: 136 mmol/L (ref 135–145)

## 2022-06-09 LAB — CBC
HCT: 30.4 % — ABNORMAL LOW (ref 39.0–52.0)
Hemoglobin: 10.4 g/dL — ABNORMAL LOW (ref 13.0–17.0)
MCH: 30 pg (ref 26.0–34.0)
MCHC: 34.2 g/dL (ref 30.0–36.0)
MCV: 87.6 fL (ref 80.0–100.0)
Platelets: 217 10*3/uL (ref 150–400)
RBC: 3.47 MIL/uL — ABNORMAL LOW (ref 4.22–5.81)
RDW: 14.8 % (ref 11.5–15.5)
WBC: 11.6 10*3/uL — ABNORMAL HIGH (ref 4.0–10.5)
nRBC: 0 % (ref 0.0–0.2)

## 2022-06-09 LAB — HEPATITIS B SURFACE ANTIGEN: Hepatitis B Surface Ag: NONREACTIVE

## 2022-06-09 LAB — PROCALCITONIN: Procalcitonin: >150 ng/mL

## 2022-06-09 LAB — MAGNESIUM: Magnesium: 1.7 mg/dL (ref 1.7–2.4)

## 2022-06-09 LAB — PHOSPHORUS: Phosphorus: 3.6 mg/dL (ref 2.5–4.6)

## 2022-06-09 MED ORDER — HYDRALAZINE HCL 50 MG PO TABS
50.0000 mg | ORAL_TABLET | Freq: Three times a day (TID) | ORAL | Status: DC
  Administered 2022-06-09 – 2022-06-19 (×10): 50 mg via ORAL
  Filled 2022-06-09 (×13): qty 1

## 2022-06-09 MED ORDER — OSELTAMIVIR PHOSPHATE 30 MG PO CAPS
30.0000 mg | ORAL_CAPSULE | Freq: Once | ORAL | Status: AC
  Administered 2022-06-09: 30 mg via ORAL
  Filled 2022-06-09: qty 1

## 2022-06-09 MED ORDER — CLOPIDOGREL BISULFATE 75 MG PO TABS
75.0000 mg | ORAL_TABLET | Freq: Every day | ORAL | Status: DC
  Administered 2022-06-10 – 2022-06-14 (×3): 75 mg via ORAL
  Filled 2022-06-09 (×6): qty 1

## 2022-06-09 MED ORDER — AMLODIPINE BESYLATE 10 MG PO TABS
10.0000 mg | ORAL_TABLET | Freq: Every day | ORAL | Status: DC
  Administered 2022-06-09 – 2022-06-11 (×3): 10 mg via ORAL
  Filled 2022-06-09 (×3): qty 1

## 2022-06-09 MED ORDER — HEPARIN SODIUM (PORCINE) 1000 UNIT/ML IJ SOLN
INTRAMUSCULAR | Status: AC
  Filled 2022-06-09: qty 4

## 2022-06-09 MED ORDER — CLEVIDIPINE BUTYRATE 0.5 MG/ML IV EMUL
0.0000 mg/h | INTRAVENOUS | Status: DC
  Administered 2022-06-09: 6 mg/h via INTRAVENOUS
  Administered 2022-06-09: 1 mg/h via INTRAVENOUS
  Filled 2022-06-09 (×4): qty 50

## 2022-06-09 MED ORDER — LABETALOL HCL 200 MG PO TABS
300.0000 mg | ORAL_TABLET | Freq: Two times a day (BID) | ORAL | Status: DC
  Administered 2022-06-09 – 2022-06-19 (×8): 300 mg via ORAL
  Filled 2022-06-09 (×12): qty 1

## 2022-06-09 MED ORDER — ASPIRIN 81 MG PO CHEW
81.0000 mg | CHEWABLE_TABLET | Freq: Every day | ORAL | Status: DC
  Administered 2022-06-10 – 2022-06-14 (×3): 81 mg via ORAL
  Filled 2022-06-09 (×6): qty 1

## 2022-06-09 MED ORDER — ONDANSETRON HCL 4 MG/2ML IJ SOLN
4.0000 mg | Freq: Three times a day (TID) | INTRAMUSCULAR | Status: AC | PRN
  Administered 2022-06-09: 4 mg via INTRAVENOUS
  Filled 2022-06-09: qty 2

## 2022-06-09 MED ORDER — DICLOFENAC SODIUM 1 % EX GEL
4.0000 g | Freq: Once | CUTANEOUS | Status: DC
  Filled 2022-06-09: qty 100

## 2022-06-09 NOTE — Progress Notes (Signed)
Pt is not unknown status as documented by previous HD RN. Pt has antibodies that are in date. However, pt's hep b surface antigen expired 05/05/22 and I can't find any documentation of anything more recent in care everywhere. Chart Review in EPIC shows that hep b surface antigen and quantitative were both collected 11/7 @ 1423 at [facility] ED but have never resulted. When I took over the care of the pt this morning, I called the lab to see why there were no results yet, pt stated that since it was done at [facility], they batch then and send them over but they should have received it by now and they have not received in the lab. So I re entered the orders and re drew the samples and sent to lab w/ note reminding them I had already spoken w/ someone from lab about not holding this lab until they get a "batch" to run as I need the results ASAP.

## 2022-06-09 NOTE — Progress Notes (Signed)
NAME:  Cody Macdonald, MRN:  [id number], DOB:  08-Jan-1961, LOS: 1 ADMISSION DATE:  06/08/2022, CONSULTATION DATE:  06/08/22 REFERRING MD:  Vanita Panda CHIEF COMPLAINT:  Dyspnea   History of Present Illness:  Cody Macdonald is a 61 y.o. male who has a PMH as below including but not limited to ESRD on HD MWF, presumed last session Mon 11/6.  He resides at [facility] and presented to [facility] ED 11/7 with dyspnea, hypertensive urgency, hyperkalemia. He required 10L HFNC in ED to maintain sats > 90%. CXR showed interstitial edema and he was diffusely bronchospastic. Flu A noted to be positive with negative COVID. SBP as high as 190. He was found to have K 6.6 for which nephrology was consulted and given his presentation with pulmonary edema, hyperkalemia, respiratory distress, it was felt that he would be best served with transfer to [facility] for emergent HD.  PCCM subsequently called for admission.  Pertinent  Medical History:  has ESRD (end stage renal disease) ([place]); Symptomatic anemia; History of stroke; Tobacco abuse; Alcohol abuse; Hypoalbuminemia; AKI (acute kidney injury) ([place]); Abdominal distention; Leg edema; Malnutrition of moderate degree; Volume overload; Hypertension; Hyperkalemia; Pulmonary edema; HCAP (healthcare-associated pneumonia); and Hypertensive urgency on their problem list.  Significant Hospital Events: Including procedures, antibiotic start and stop dates in addition to other pertinent events   11/7 > admit., Vanc/Cefe> 11/8 TXR to [facility], IHD, Tamiflu, Vanc stopped  Interim History / Subjective:  Cleviprex on 81mcg  Subjective exam limited due to BIPAP. Denies SOB or chest pain.  Objective:  Blood pressure (!) 167/90, pulse 97, temperature 98.7 F (37.1 C), temperature source Axillary, resp. rate (!) 35, weight 47.4 kg, SpO2 94 %.    Vent Mode: BIPAP;PCV FiO2 (%):  [50 %-85 %] 60 % Set Rate:  [8 bmp-10 bmp] 10 bmp PEEP:  [8 cmH20] 8 cmH20   Intake/Output Summary  (Last 24 hours) at 06/09/2022 0951 Last data filed at 06/09/2022 4128 Gross per 24 hour  Intake 338.46 ml  Output 3000 ml  Net -2661.54 ml   Filed Weights   06/08/22 1600 06/08/22 2300 06/09/22 0900  Weight: 48.1 kg 48.1 kg 47.4 kg    Examination: General: In bed, NAD, appears comfortable, frail, BiPAP on HEENT: MM pink/moist, anicteric, atraumatic Neuro: RASS 0, PERRL 12mm, MAE, stregnth greater on the right compared to the left, CV: S1S2, ST, no m/r/g appreciated PULM:  air movement in all lobes, trachea midline, chest expansion symmetric GI: soft, bsx4 active, non-tender   Extremities: warm/dry, no pretibial edema, capillary refill less than 3 seconds  Skin: Tunneled HD cath to Right upper chest, L fistula accessed, no rashes or lesions noted  Labs/Imag Creat 6.56>3.51 Bun 44>29 WBC 13.2>11.6 HGB 10.5>10.4 Flu A positive Lactic acid 4.3>2.9  CXR: No pneumo, pleural effusion, patchy ground glass opacities R>L   Assessment & Plan:  Acute hypoxic respiratory failure secondary to flu and pulmonary edema secondary to missed iHD. Influenza A. Possible HCAP. CXR concerning for influenza. MRSA PCR neg. FLU A positive -Start tamiflu -Stop Vanc. Check PCT. IF less than 0.5 will stop cefepime -Volume removal with HD. -On BiPAP. Wean off BiPAP post HD. Goal SPO2 92-98%  Hypertensive urgency  Secondary to missed HD session. 3L pulled off -On cleviprex gtt. Resume home amlodipine, hydralazine, and labetalol. Goal normotension -Volume removal with HD  Hx ESRD on HD MWF. Hyperkalemia-Improving Missed session of HD this week. 3L off with HD - Neph following -HD per neph -Follow  up AM K level  Hx CVA with residual left sided weakness on DAPT. -Resume ASA, plavix   Best practice (evaluated daily):  Diet/type: NPO w/ oral meds DVT prophylaxis: prophylactic heparin  GI prophylaxis: PPI Lines: Dialysis Catheter - tunneled, chronic Foley:  N/A Code Status:  full code Last  date of multidisciplinary goals of care discussion: pending  Critical care time: 39 min.   The patient is critically ill with multiple organ systems failure and requires high complexity decision making for assessment and support, frequent evaluation and titration of therapies, application of advanced monitoring technologies and extensive interpretation of multiple databases.    Critical Care Time devoted to patient care services described in this note is 39 minutes. This time reflects time of care of this [place] NP. This critical care time does not reflect procedure time but could involve care discussion time with the PCCM attending.  Redmond School., MSN, APRN, AGACNP-BC [facility]  06/09/2022 , 9:51 AM  Please see [email or web address] for pager details  If no response, please call [phone] After hours, please call Elink at [phone]

## 2022-06-09 NOTE — Consult Note (Signed)
Reason for Consult: Hyperkalemia, volume overload in patient with ESRD Referring Physician: Ina Homes, MD (CCM)  HPI: 61 year old man with past medical history significant for hypertension, dyslipidemia and end-stage renal disease on hemodialysis (MWF, [place]).  Brought to the [place] emergency room from his skilled nursing facility where he resides (status post CVA with residual left-sided deficits) for concerns with increasing shortness of breath and found to have significantly elevated hypertension with hyperkalemia and pulmonary edema on evaluation.  He was also found to be positive for influenza A with negative COVID-19.  Overnight was transferred from [facility] long emergency to [facility] ICU for additional management.  The patient informs me that he missed his hemodialysis treatment on Monday 11/6 because he was feeling poorly.  Dialysis was initially attempted via his right IJ [facility] that was found to be nonfunctional following which the patient informed providers that the dialysis unit was actively using his left upper arm AV graft (placed 04/20/2022) for hemodialysis.  This was successfully cannulated and the patient is currently undergoing dialysis when seen.  Past Medical History:  Diagnosis Date   Chronic kidney disease    Hypertension    Stroke [facility])     Past Surgical History:  Procedure Laterality Date   IR FLUORO GUIDE CV LINE RIGHT  12/25/2021   IR US GUIDE VASC ACCESS RIGHT  12/25/2021    No family history on file.  Social History:  reports that he has been smoking cigarettes. He has been smoking an average of .25 packs per day. He has never used smokeless tobacco. He reports current alcohol use of about 1.0 standard drink of alcohol per week. He reports current drug use. Drug: Marijuana.  Allergies: No Known Allergies  Medications: I have reviewed the patient's current medications. Scheduled:  acetaminophen  1,000 mg Oral Once   Chlorhexidine  Gluconate Cloth  6 each Topical Q0600   dextrose       heparin  5,000 Units Subcutaneous Q8H   heparin sodium (porcine)       mouth rinse  15 mL Mouth Rinse 4 times per day   oseltamivir  30 mg Oral Once   vancomycin variable dose per unstable renal function (pharmacist dosing)   Does not apply See admin instructions   Continuous:  anticoagulant sodium citrate     ceFEPime (MAXIPIME) IV     clevidipine 4 mg/hr (06/09/22 0600)      Latest Ref Rng & Units 06/09/2022    6:47 AM 06/08/2022   11:32 AM 04/12/2022    6:33 AM  BMP  Glucose 70 - 99 mg/dL 86  57  87   BUN 6 - 20 mg/dL 29  44  60   Creatinine 0.61 - 1.24 mg/dL 3.51  6.56  9.10   Sodium 135 - 145 mmol/L 136  138  135   Potassium 3.5 - 5.1 mmol/L 3.8  6.6  4.8   Chloride 98 - 111 mmol/L 97  99  94   CO2 22 - 32 mmol/L 24  20  28    Calcium 8.9 - 10.3 mg/dL 9.1  9.6  9.8       Latest Ref Rng & Units 06/09/2022    6:47 AM 06/08/2022   11:32 AM 04/12/2022    6:54 AM  CBC  WBC 4.0 - 10.5 K/uL 11.6  13.2  7.3   Hemoglobin 13.0 - 17.0 g/dL 10.4  10.5  9.0   Hematocrit 39.0 - 52.0 % 30.4  36.5  27.0   Platelets 150 - 400 K/uL 217  245  255    DG Chest Port 1 View  Result Date: 06/09/2022 CLINICAL DATA:  61 year old male with respiratory failure, sepsis. End stage renal disease. EXAM: PORTABLE CHEST 1 VIEW COMPARISON:  Portable chest 06/08/2022 and earlier. FINDINGS: Portable AP semi upright view at 0417 hours. Stable right chest dual lumen dialysis type catheter. Larger lung volumes. But increasing confluence of perihilar and widespread right greater than left lung opacity. Multilobar involvement suspected bilaterally, and early consolidation suspected now. No superimposed pneumothorax. Small right pleural effusion appears stable. Pulmonary vascularity in the affected areas appears relatively normal. Stable mild cardiomegaly and other mediastinal contours. No acute osseous abnormality identified. Paucity of bowel gas in the upper  abdomen. IMPRESSION: 1. Progressed bilateral multilobar airspace opacity now with early consolidation suspected. Differential considerations include asymmetric pulmonary edema, bilateral pneumonia, developing ARDS. 2. Stable mild cardiomegaly and small right pleural effusion. Electronically Signed   By: Genevie Ann M.D.   On: 06/09/2022 05:03   DG Chest Port 1 View  Result Date: 06/08/2022 CLINICAL DATA:  Sepsis.  Shortness of breath. EXAM: PORTABLE CHEST 1 VIEW COMPARISON:  05/26/2022 FINDINGS: There is a right chest wall dialysis catheter with tips at the superior cavoatrial junction. Stable cardiomediastinal contours. There is a small right pleural effusion. Mild diffuse interstitial edema identified. Bilateral airspace opacities are identified which may represent areas of superimposed pneumonia or asymmetric pulmonary edema. Visualized osseous structures are unremarkable. IMPRESSION: 1. Small right pleural effusion and diffuse interstitial edema. 2. Bilateral airspace opacities compatible with superimposed pneumonia or asymmetric pulmonary edema. Electronically Signed   By: Kerby Moors M.D.   On: 06/08/2022 12:20    Review of Systems  Unable to perform ROS: Other (Patient on BiPAP)   Blood pressure 139/76, pulse (!) 102, temperature 98.4 F (36.9 C), temperature source Axillary, resp. rate (!) 24, weight 48.1 kg, SpO2 90 %. Physical Exam Vitals and nursing note reviewed.  Constitutional:      Appearance: He is ill-appearing.     Comments: Appears cachectic with muscle wasting.  Not in distress while on BiPAP  HENT:     Head: Normocephalic and atraumatic.  Eyes:     Extraocular Movements: Extraocular movements intact.  Neck:     Vascular: JVD present.     Comments: 8 to 10 cm JVP Cardiovascular:     Rate and Rhythm: Regular rhythm. Tachycardia present.     Heart sounds: No murmur heard.    No friction rub.  Pulmonary:     Effort: Accessory muscle usage present.     Breath sounds:  Examination of the right-lower field reveals rales. Examination of the left-lower field reveals rales. Rales present.     Comments: On BiPAP.  Right IJ TDC Abdominal:     General: Bowel sounds are normal.     Palpations: Abdomen is soft. There is no mass.     Tenderness: There is no guarding.  Musculoskeletal:     Right lower leg: No edema.     Left lower leg: No edema.     Comments: Left upper arm AV graft cannulated for hemodialysis  Skin:    General: Skin is warm and dry.     Findings: No rash.  Neurological:     Mental Status: He is alert.    Assessment/Plan: 1.  Acute hypoxic respiratory failure: This appears to be from a combination of pulmonary edema/volume overload with bronchospasm along with influenza a/possible HCAP.  On antimicrobial coverage and anticipate additional improvement with ultrafiltration on hemodialysis with the ability to wean him off of BiPAP. 2.  Hyperkalemia: Secondary to missed dialysis earlier this week and treated with [facility] yesterday while in the emergency room with definitive management being undertaken with hemodialysis. 3.  End-stage renal disease: Usually on Monday/Wednesday/Friday dialysis schedule and currently getting hemodialysis at this time. 4.  Hypertension: Initially presented with hypertensive urgency in the setting of respiratory distress/volume overload-blood pressures improving with ultrafiltration. 5.  Secondary hyperparathyroidism: Phosphorus binders/diet currently on hold and will be restarted when patient stable enough for oral intake. 6.  Anemia of chronic disease: Hemoglobin and hematocrit currently at goal, no overt loss and will continue to monitor closely to decide on need to start ESA.  Gertie Broerman K. 06/09/2022, 8:09 AM

## 2022-06-09 NOTE — Progress Notes (Addendum)
Swallow screening performed at bedside. Results seen below. Patient exhibited small amount of coughing/clearing throat after drinking. For this reason, SLP ordered per Hillery Aldo, NP. Per NP, we can continue to give sips with meds but otherwise, patient is to remain NPO until formal swallow evaluation by SLP.    06/09/22 1558  Pre-Screen Questions- If "YES" to any of the following questions, STOP the screen, keep NPO, and place order for SLP eval and treat   Home diet required thickened liquids No  Trach tube present No  Radiation to Head/Neck No  Patient Readiness for Screen - If "YES" to any of the following questions, WAIT to screen, keep NPO  Is patient lethargic or unable to stay alert/awake? No  HOB restricted to <30 degrees No  NPO for planned procedure No  Brief Cognitive Screen- Aspiration Risk Assessment  Is patient oriented to name, place, or year? Yes  Able to open mouth, stick out tongue, or smile Yes  Oral Mechanism Exam  Able to seal lips Yes  Able to move tongue from side to side Yes  Face is symmetric Yes  Mandatory Oral Care Performed   Mandatory oral care performed Yes  3 oz Water Swallow Challenge  Does the patient stop drinking? No  Does the patient cough/choke? (!) Yes  Screening Result (!) Failed

## 2022-06-09 NOTE — Progress Notes (Signed)
   06/09/22 0900  Vitals  Temp 98.7 F (37.1 C)  Temp Source Axillary  BP (!) 167/90  MAP (mmHg) 113  BP Location Right Arm  BP Method Automatic  Patient Position (if appropriate) Lying  Pulse Rate 97  Pulse Rate Source Monitor  ECG Heart Rate 97  Resp (!) 35  Oxygen Therapy  SpO2 94 %  O2 Device Bi-PAP  FiO2 (%) 60 %  Pulse Oximetry Type Continuous   Received patient in bed to unit.  Alert and oriented.  Informed consent signed and in chart.   Treatment initiated: 2320 on 11/7 Treatment completed: 0833  Patient tolerated well. Hoever, there were several issues that made this 3.5 hr tx go for 10 hrs in this pt's room first HD RN had trouble gaining access, once that was worked out he had cartridge/machine issues multiple times that caused him to eventually have to change machines out and then re set up and restart tx.  Alert, without acute distress.  Hand-off given to patient's nurse.   Access used: AVF Access issues: hard to access as it is a new AVG  Total UF removed: 3000 ml Medication(s) given: NA Post HD VS: see above Post HD weight: 47.4kg   [facility]

## 2022-06-09 NOTE — Progress Notes (Signed)
[place] Progress Note Patient Name: Cody Macdonald DOB: 02-21-1961 MRN: [id number]   Date of Service  06/09/2022  HPI/Events of Note  Patient c/o bilateral hand pain. AST - 46 and Creatinine = 3.54. Therefore, unable to use Tylenol or NSAID PO.  eICU Interventions  Voltaren 1% gel 4 gm to hands x 1.     Intervention Category Major Interventions: Other:  Lysle Dingwall 06/09/2022, 10:25 PM

## 2022-06-09 NOTE — Progress Notes (Signed)
PCCM INTERVAL PROGRESS NOTE   Per chart review/Care everywhere: OK to use LUE graft for HD. Patient now says this is what they have been using for HD. Not the tunneled catheter. Notified HD RN. He will attempt HD via graft.     Georgann Housekeeper, AGACNP-BC [facility]  See Amion for personal pager PCCM on call pager [phone] until 7pm. Please call Elink 7p-7a. [phone]  06/09/2022 3:25 AM

## 2022-06-09 NOTE — Progress Notes (Addendum)
Brief Progress Note  TDC nonfunctional despite tPA dwell.  D/w CCM who will eval and likely place temp HD cath, we can reattempt HD thereafter.

## 2022-06-09 NOTE — Progress Notes (Signed)
PCCM INTERVAL PROGRESS NOTE   Remains hypertensive. HD is now delayed due to issue with machine. Will start Clevidipine infusion for goal SBP < 160 mm Hg.     Georgann Housekeeper, AGACNP-BC [facility]  See Amion for personal pager PCCM on call pager [phone] until 7pm. Please call Elink 7p-7a. [phone]  06/09/2022 3:57 AM

## 2022-06-09 NOTE — Progress Notes (Signed)
Initial Nutrition Assessment  DOCUMENTATION CODES:  Underweight, Severe malnutrition in context of chronic illness  INTERVENTION:  Advance diet at tolerated pending respiratory status Consider placement of cortrak tube if in align with pt's GOC and intake is inadequate  NUTRITION DIAGNOSIS:   Severe Malnutrition related to chronic illness (ESRD on HD) as evidenced by severe muscle depletion, severe fat depletion.  GOAL:   Patient will meet greater than or equal to 90% of their needs  MONITOR:   Diet advancement, Weight trends, Supplement acceptance, Labs  REASON FOR ASSESSMENT:   Rounds    ASSESSMENT:   Pt with hx of ESRD on HD, hx CVA, HTN, and hx of EtOH and tobacco abuse presented to ED from SNF with SOB, hyperkalemia, and in HTN crisis.  Pt resting in bed at the time of assessment. Just finished with emergent HD session and taken off of BiPAP. Pt confused, did not know he was at the hospital and thought it was night. RN reports that she had just oriented pt to time and place.  Currently NPO due to respiratory status and just coming off of BiPAP. RN reports that pt did well with sips of liquids this AM. Discussed with CCM NP, plan to monitor mental status as he is more altered than on admission.   Depending on progress, pt may benefit from palliative consult to determine if aggressive interventions such as a feeding tube are desired. Pt is extremely thin on exam with virtually no muscle stores which is indicative or long term poor nutrition.   Outpatient HD: MWF, Left AV Graft EDW: unsure Last HD 11/8: Net UF 3L Post HD weight, 47.4kg   Intake/Output Summary (Last 24 hours) at 06/09/2022 1139 Last data filed at 06/09/2022 3419 Gross per 24 hour  Intake 338.46 ml  Output 3000 ml  Net -2661.54 ml  Net IO Since Admission: -2,661.54 mL [06/09/22 1139]  Nutritionally Relevant Medications: Scheduled Meds:  vancomycin variable dose per unstable renal function  (pharmacist dosing)   Does not apply See admin instructions   Continuous Infusions:  ceFEPime (MAXIPIME) IV     clevidipine 4 mg/hr (06/09/22 0600)   PRN Meds: dextrose, docusate sodium, polyethylene glycol  Labs Reviewed: Na 134, chloride 97 BUN 29, creatinine 3.51 CBG ranges from 69-88 mg/dL over the last 24 hours  NUTRITION - FOCUSED PHYSICAL EXAM: Flowsheet Row Most Recent Value  Orbital Region Severe depletion  Upper Arm Region Severe depletion  Thoracic and Lumbar Region Severe depletion  Buccal Region Severe depletion  Temple Region Severe depletion  Clavicle Bone Region Severe depletion  Clavicle and Acromion Bone Region Severe depletion  Scapular Bone Region Severe depletion  Dorsal Hand Severe depletion  Patellar Region Severe depletion  Anterior Thigh Region Severe depletion  Posterior Calf Region Severe depletion  Edema (RD Assessment) None  Hair Reviewed  Eyes Reviewed  Mouth Reviewed  Skin Reviewed  Nails Reviewed    Diet Order:   Diet Order             Diet NPO time specified  Diet effective now                   EDUCATION NEEDS:  Not appropriate for education at this time  Skin:  Skin Assessment: Reviewed RN Assessment  Last BM:  unsure  Height:  Ht Readings from Last 1 Encounters:  06/09/22 5\' 8"  (1.727 m)    Weight:  Wt Readings from Last 1 Encounters:  06/09/22 47.4 kg  Ideal Body Weight:  70 kg  BMI:  Body mass index is 15.89 kg/m.  Estimated Nutritional Needs:  Kcal:  1700-1900 kcal/d Protein:  80-100 g/d Fluid:  1L+UOP    Ranell Patrick, RD, LDN Clinical Dietitian RD pager # available in AMION  After hours/weekend pager # available in [facility]

## 2022-06-10 ENCOUNTER — Inpatient Hospital Stay (HOSPITAL_COMMUNITY): Payer: Medicaid Other

## 2022-06-10 DIAGNOSIS — J189 Pneumonia, unspecified organism: Secondary | ICD-10-CM | POA: Diagnosis not present

## 2022-06-10 LAB — CBC
HCT: 28.2 % — ABNORMAL LOW (ref 39.0–52.0)
Hemoglobin: 9 g/dL — ABNORMAL LOW (ref 13.0–17.0)
MCH: 28.9 pg (ref 26.0–34.0)
MCHC: 31.9 g/dL (ref 30.0–36.0)
MCV: 90.7 fL (ref 80.0–100.0)
Platelets: 174 K/uL (ref 150–400)
RBC: 3.11 MIL/uL — ABNORMAL LOW (ref 4.22–5.81)
RDW: 14.6 % (ref 11.5–15.5)
WBC: 5.8 K/uL (ref 4.0–10.5)
nRBC: 0 % (ref 0.0–0.2)

## 2022-06-10 LAB — BASIC METABOLIC PANEL WITH GFR
Anion gap: 17 — ABNORMAL HIGH (ref 5–15)
BUN: 55 mg/dL — ABNORMAL HIGH (ref 6–20)
CO2: 22 mmol/L (ref 22–32)
Calcium: 8.3 mg/dL — ABNORMAL LOW (ref 8.9–10.3)
Chloride: 93 mmol/L — ABNORMAL LOW (ref 98–111)
Creatinine, Ser: 5.47 mg/dL — ABNORMAL HIGH (ref 0.61–1.24)
GFR, Estimated: 11 mL/min — ABNORMAL LOW
Glucose, Bld: 74 mg/dL (ref 70–99)
Potassium: 4.8 mmol/L (ref 3.5–5.1)
Sodium: 132 mmol/L — ABNORMAL LOW (ref 135–145)

## 2022-06-10 LAB — GLUCOSE, CAPILLARY
Glucose-Capillary: 72 mg/dL (ref 70–99)
Glucose-Capillary: 79 mg/dL (ref 70–99)
Glucose-Capillary: 87 mg/dL (ref 70–99)
Glucose-Capillary: 97 mg/dL (ref 70–99)

## 2022-06-10 LAB — PROCALCITONIN: Procalcitonin: >150 ng/mL

## 2022-06-10 LAB — MAGNESIUM: Magnesium: 1.7 mg/dL (ref 1.7–2.4)

## 2022-06-10 LAB — PHOSPHORUS: Phosphorus: 7.7 mg/dL — ABNORMAL HIGH (ref 2.5–4.6)

## 2022-06-10 LAB — LACTIC ACID, PLASMA: Lactic Acid, Venous: 1.2 mmol/L (ref 0.5–1.9)

## 2022-06-10 MED ORDER — SODIUM CHLORIDE 0.9 % IV SOLN
2.0000 g | INTRAVENOUS | Status: DC
  Administered 2022-06-10 – 2022-06-14 (×5): 2 g via INTRAVENOUS
  Filled 2022-06-10 (×5): qty 20

## 2022-06-10 MED ORDER — OSELTAMIVIR PHOSPHATE 30 MG PO CAPS
30.0000 mg | ORAL_CAPSULE | Freq: Once | ORAL | Status: AC
  Administered 2022-06-11: 30 mg via ORAL
  Filled 2022-06-10 (×2): qty 1

## 2022-06-10 MED ORDER — PANTOPRAZOLE SODIUM 40 MG PO TBEC
40.0000 mg | DELAYED_RELEASE_TABLET | Freq: Every day | ORAL | Status: DC
  Administered 2022-06-10 – 2022-06-14 (×3): 40 mg via ORAL
  Filled 2022-06-10 (×7): qty 1

## 2022-06-10 MED ORDER — DARBEPOETIN ALFA 60 MCG/0.3ML IJ SOSY
60.0000 ug | PREFILLED_SYRINGE | INTRAMUSCULAR | Status: DC
  Administered 2022-06-11 – 2022-06-17 (×2): 60 ug via SUBCUTANEOUS
  Filled 2022-06-10 (×3): qty 0.3

## 2022-06-10 MED ORDER — SEVELAMER CARBONATE 800 MG PO TABS
800.0000 mg | ORAL_TABLET | Freq: Three times a day (TID) | ORAL | Status: DC
  Administered 2022-06-10 (×2): 800 mg via ORAL
  Filled 2022-06-10 (×3): qty 1

## 2022-06-10 NOTE — Progress Notes (Addendum)
NAME:  Cody Macdonald, MRN:  [id number], DOB:  22-Feb-1961, LOS: 2 ADMISSION DATE:  06/08/2022, CONSULTATION DATE:  06/08/22 REFERRING MD:  Vanita Panda CHIEF COMPLAINT:  Dyspnea   History of Present Illness:  Cody Macdonald is a 61 y.o. male who has a PMH as below including but not limited to ESRD on HD MWF, presumed last session Mon 11/6.  He resides at [facility] and presented to [facility] ED 11/7 with dyspnea, hypertensive urgency, hyperkalemia. He required 10L HFNC in ED to maintain sats > 90%. CXR showed interstitial edema and he was diffusely bronchospastic. Flu A noted to be positive with negative COVID. SBP as high as 190. He was found to have K 6.6 for which nephrology was consulted and given his presentation with pulmonary edema, hyperkalemia, respiratory distress, it was felt that he would be best served with transfer to [facility] for emergent HD.  PCCM subsequently called for admission.  Pertinent  Medical History:  has ESRD (end stage renal disease) ([place]); Symptomatic anemia; History of stroke; Tobacco abuse; Alcohol abuse; Hypoalbuminemia; AKI (acute kidney injury) ([place]); Abdominal distention; Leg edema; Malnutrition of moderate degree; Volume overload; Hypertension; Hyperkalemia; Pulmonary edema; HCAP (healthcare-associated pneumonia); Hypertensive urgency; Influenza A; and Acute respiratory failure with hypoxia ([place]) on their problem list.  Significant Hospital Events: Including procedures, antibiotic start and stop dates in addition to other pertinent events   11/7 > admit., Vanc/Cefe> 11/8 TXR to [facility], IHD, Tamiflu, Vanc stopped, off cleviprex  Interim History / Subjective:  Off cleviprex  No acute events overnight  O2 needs weaned to [facility]  Objective:  Blood pressure (!) 145/86, pulse 89, temperature 98.5 F (36.9 C), temperature source Oral, resp. rate (!) 22, height 5\' 8"  (1.727 m), weight 49.7 kg, SpO2 95 %.    FiO2 (%):  [55 %-80 %] 55 %   Intake/Output Summary (Last  24 hours) at 06/10/2022 0836 Last data filed at 06/10/2022 0100 Gross per 24 hour  Intake 708.66 ml  Output 0 ml  Net 708.66 ml   Filed Weights   06/08/22 2300 06/09/22 0900 06/10/22 0500  Weight: 48.1 kg 47.4 kg 49.7 kg    Examination: General: In bed, NAD, appears comfortable, thin, frail HEENT: MM pink/moist, anicteric,  Neuro: RASS 0, PERRL 48mm, , GCS 14-15, MAE, strength on right greater than left CV: S1S2, NSR, no m/r/g appreciated PULM: air movement in all lobes, trachea midline, chest expansion symmetric GI: soft, bsx4 active, non-tender   Extremities: warm/dry, no pretibial edema, capillary refill less than 3 seconds  Skin:  no rashes or lesions noted  Labs/Imag Creat 6.56>3.51>5.47 Bun 44>29>55 NA 132 K 6.6>3.8>4.8 Mag 1.7 WBC 13.2>11.6>5.8 HGB 10.5>10.4>9.0 Flu A positive Lactic acid 4.3>2.9>1.2 PCT greater than 150   Assessment & Plan:  Acute hypoxic respiratory failure secondary to flu and pulmonary edema secondary to missed iHD. Influenza A. Possible HCAP. CXR concerning for influenza. MRSA PCR neg. FLU A positive.  Oxygen weaned from 50% heated high flow to 8 L nasal cannula. WBC 13.2>11.6>5.8. Tmax 99.8. PCT greater than 150 -Continue flu -Continue cefepime for 5 to 7 days.> Deescalated to 7 days of ceftriaxone. Trend PCT for 3 days. -Volume with HD -Goal SPO2 92 to 98%.  Continue aggressive pulmonary toilet with NT suctioning.  Hypertensive urgency-resolved Secondary to missed HD session. 3L pulled off -Continue home amlodipine, hydralazine, labetalol.  Goal normotension. -Volume removal with HD  Hx ESRD on HD MWF. Hyperkalemia-resolved -Nephrology following -HD per neph -Follow-up a.m.  K level  Hx CVA with residual left sided weakness on DAPT. -Continue aspirin Plavix -Failed bedside swallow. SLP eval ordered. Cortrak per SLP.  Anemia of chronic disease HGB 10.5>10.4>9.0. No signs of active bleed. -Transfuse PRBC if HBG less than  7 -Obtain AM CBC to trend H&H -Monitor for signs of bleeding  Stable for transfer to progressive care. Will sign out to [facility] for pickup on 11/10 at 0700.  Best practice (evaluated daily):  Diet/type: NPO w/ oral meds DVT prophylaxis: prophylactic heparin  GI prophylaxis: PPI Lines: Dialysis Catheter - tunneled, chronic Foley:  N/A Code Status:  full code Last date of multidisciplinary goals of care discussion: pending  Critical care time: NA   Redmond School., MSN, APRN, AGACNP-BC [facility]  06/10/2022 , 8:36 AM  Please see [email or web address] for pager details  If no response, please call [phone] After hours, please call Elink at [phone]

## 2022-06-10 NOTE — IPAL (Signed)
  Interdisciplinary Goals of Care Family Meeting   Date carried out:: 06/10/2022  Location of the meeting: Phone conference  Member's involved: Nurse Practitioner and Family Member or next of kin  [place] of Attorney or acting medical decision maker: Cody Macdonald (Son)    Discussion: We discussed goals of care for [name]. Updated son Cody Macdonald over the phone.  Updated him on his dad's progress while in intensive care unit.  I inquired if he ever spoke with his father about what his goals were regarding CPR and mechanical ventilation. [place] stated that he had never spoken with his father about this.  He is an only child.  He said that he would speak with his mom and they would discuss this further.    Code status: Full Code  Disposition: Continue current acute care   Time spent for the meeting: 10 minutes  Cody Macdonald 06/10/2022, 9:53 AM

## 2022-06-10 NOTE — Progress Notes (Signed)
MAg of 1.7 no replaced d/t renal failure.

## 2022-06-10 NOTE — Progress Notes (Signed)
Nephrology Follow-Up Consult note   Assessment/Recommendations: Cody Macdonald is a/an 61 y.o. male with a past medical history significant for ESRD, admitted for AHRF and influenza.       1.  Acute hypoxic respiratory failure: This appears to be from a combination of pulmonary edema/volume overload with bronchospasm along with influenza a/possible HCAP.  O2 requirements have overall improved but still on high flow nasal cannula.  Planning for extra dialysis session today. 2.  Hyperkalemia: Resolved with diet since 3.  End-stage renal disease: Usually on Monday/Wednesday/Friday dialysis schedule.  Got dialysis on 06/09/2022.  Planning fracture dialysis session today.  Will decide tomorrow whether he needs dialysis again tomorrow versus Saturday. 4.  Hypertension: Blood pressure overall improved.  Continue with volume removal as tolerated 5.  Secondary hyperparathyroidism: Restart home binders 6.  Anemia of chronic disease: Hemoglobin 9.  We will start ESA.  No iron for now   Recommendations conveyed to primary service.    [facility] 06/10/2022 9:53 AM  ___________________________________________________________  CC: SOB  Interval History/Subjective: Tolerated dialysis yesterday with no issues.  Oxygen requirement overall improved but still on high flow nasal cannula.  No other complaints   Medications:  Current Facility-Administered Medications  Medication Dose Route Frequency Provider Last Rate Last Admin   acetaminophen (TYLENOL) tablet 1,000 mg  1,000 mg Oral Once Carmin Muskrat, MD       albuterol (PROVENTIL) (2.5 MG/3ML) 0.083% nebulizer solution 2.5 mg  2.5 mg Nebulization Q3H PRN Shearon Stalls, Rahul P, PA-C       amLODipine (NORVASC) tablet 10 mg  10 mg Oral Daily Estill Cotta, NP   10 mg at 06/10/22 0950   anticoagulant sodium citrate solution 5 mL  5 mL Intracatheter PRN Elmarie Shiley, MD       aspirin chewable tablet 81 mg  81 mg Oral Daily  Estill Cotta, NP   81 mg at 06/10/22 0950   cefTRIAXone (ROCEPHIN) 2 g in sodium chloride 0.9 % 100 mL IVPB  2 g Intravenous Q24H Estill Cotta, NP       Chlorhexidine Gluconate Cloth 2 % PADS 6 each  6 each Topical Q0600 Elmarie Shiley, MD   6 each at 06/10/22 0951   clopidogrel (PLAVIX) tablet 75 mg  75 mg Oral Daily Estill Cotta, NP   75 mg at 06/10/22 0950   diclofenac Sodium (VOLTAREN) 1 % topical gel 4 g  4 g Topical Once Anders Simmonds, MD       docusate sodium (COLACE) capsule 100 mg  100 mg Oral BID PRN Shearon Stalls, Rahul P, PA-C       heparin injection 1,000 Units  1,000 Units Intracatheter PRN Elmarie Shiley, MD   1,000 Units at 06/09/22 0541   heparin injection 5,000 Units  5,000 Units Subcutaneous Q8H Desai, Rahul P, PA-C   5,000 Units at 06/10/22 3893   hydrALAZINE (APRESOLINE) injection 10-40 mg  10-40 mg Intravenous Q4H PRN Shearon Stalls, Rahul P, PA-C   20 mg at 06/09/22 0257   hydrALAZINE (APRESOLINE) tablet 50 mg  50 mg Oral Q8H Estill Cotta, NP   50 mg at 06/10/22 7342   labetalol (NORMODYNE) injection 10 mg  10 mg Intravenous Q10 min PRN Carmin Muskrat, MD   10 mg at 06/08/22 2041   labetalol (NORMODYNE) tablet 300 mg  300 mg Oral BID Estill Cotta, NP   300 mg at 06/10/22 0950   ondansetron (ZOFRAN) injection 4 mg  4 mg Intravenous Q8H PRN Estill Cotta, NP   4 mg at 06/09/22 1349   Oral care mouth rinse  15 mL Mouth Rinse 4 times per day Brand Males, MD   15 mL at 06/10/22 0800   Oral care mouth rinse  15 mL Mouth Rinse PRN Brand Males, MD       oseltamivir (TAMIFLU) capsule 30 mg  30 mg Oral Once Ventura Sellers, RPH       pantoprazole (PROTONIX) EC tablet 40 mg  40 mg Oral Daily Estill Cotta, NP   40 mg at 06/10/22 0950   polyethylene glycol (MIRALAX / GLYCOLAX) packet 17 g  17 g Oral Daily PRN Shearon Stalls, Rahul P, PA-C          Review of Systems: 10 systems reviewed and negative except per interval history/subjective  Physical Exam: Vitals:    06/10/22 0823 06/10/22 0900  BP:  (!) 151/77  Pulse: 89 84  Resp: (!) 22 15  Temp:    SpO2: 95% 96%   No intake/output data recorded.  Intake/Output Summary (Last 24 hours) at 06/10/2022 0953 Last data filed at 06/10/2022 0100 Gross per 24 hour  Intake 696.74 ml  Output 0 ml  Net 696.74 ml   Constitutional: tired appearing, nad ENMT: ears and nose without scars or lesions, MMM CV: normal rate, no edema Respiratory: coarse breath sounds, bilateral chest rise, mild iwob Gastrointestinal: soft, non-tender, no palpable masses or hernias Skin: no visible lesions or rashes Psych: alert, judgement/insight appropriate, appropriate mood and affect   Test Results I personally reviewed new and old clinical labs and radiology tests Lab Results  Component Value Date   NA 132 (L) 06/10/2022   K 4.8 06/10/2022   CL 93 (L) 06/10/2022   CO2 22 06/10/2022   BUN 55 (H) 06/10/2022   CREATININE 5.47 (H) 06/10/2022   CALCIUM 8.3 (L) 06/10/2022   ALBUMIN 3.8 06/08/2022   PHOS 7.7 (H) 06/10/2022    CBC Recent Labs  Lab 06/08/22 1132 06/09/22 0647 06/09/22 1100 06/10/22 0509  WBC 13.2* 11.6*  --  5.8  NEUTROABS 11.4*  --   --   --   HGB 10.5* 10.4* 11.2* 9.0*  HCT 36.5* 30.4* 33.0* 28.2*  MCV 101.1* 87.6  --  90.7  PLT 245 217  --  174

## 2022-06-10 NOTE — Evaluation (Signed)
Clinical/Bedside Swallow Evaluation Patient Details  Name: Cody Macdonald MRN: [id number] Date of Birth: 08-04-1960  Today's Date: 06/10/2022 Time: SLP Start Time (ACUTE ONLY): 1034 SLP Stop Time (ACUTE ONLY): 1046 SLP Time Calculation (min) (ACUTE ONLY): 12 min  Past Medical History:  Past Medical History:  Diagnosis Date   Chronic kidney disease    Hypertension    Stroke [facility])    Past Surgical History:  Past Surgical History:  Procedure Laterality Date   IR FLUORO GUIDE CV LINE RIGHT  12/25/2021   IR US GUIDE VASC ACCESS RIGHT  12/25/2021   HPI:  Pt is a 61 y.o. male who presented to [facility] ED 11/7 with dyspnea, hypertensive urgency, hyperkalemia and was transferred to [facility] for dialysis. CXR 11/8: Progressed bilateral multilobar airspace opacity now with early  consolidation suspected. Differential considerations include  asymmetric pulmonary edema, bilateral pneumonia, developing ARDS. Pt found to have influenza A. PMH: ESRD on HD MWF, CKD, CVA, HTN, Etoh abuse tobacco abuse, anemia.    Assessment / Plan / Recommendation  Clinical Impression  Pt was seen for bedside swallow evaluation and he denied a history of dysphagia. Oral mechanism exam was [facility] and he presented with adequate, natural dentition. Pt exhibited symptoms of pharyngeal dysphagia characterized by a suspected pharyngeal delay, and coughing of aspiration with thin liquids via cup and straw. Coughing was were reduced, but not eliminated, with prompts for reduced bolus sizes. A modified barium swallow study is recommended to further assess swallow function; scheduling of this is pending radiology's schedule. SLP Visit Diagnosis: Dysphagia, unspecified (R13.10)    Aspiration Risk       Diet Recommendation  (Pending MBS)   Medication Administration: Whole meds with puree Supervision: Patient able to self feed Postural Changes: Seated upright at 90 degrees    Other  Recommendations Oral Care Recommendations: Oral care BID     Recommendations for follow up therapy are one component of a multi-disciplinary discharge planning process, led by the attending physician.  Recommendations may be updated based on patient status, additional functional criteria and insurance authorization.  Follow up Recommendations  (TBD)      Assistance Recommended at Discharge Frequent or constant Supervision/Assistance  Functional Status Assessment Patient has had a recent decline in their functional status and demonstrates the ability to make significant improvements in function in a reasonable and predictable amount of time.  Frequency and Duration min 2x/week  2 weeks       Prognosis Prognosis for Safe Diet Advancement: Good      Swallow Study   General Date of Onset: 06/09/22 HPI: Pt is a 61 y.o. male who presented to [facility] ED 11/7 with dyspnea, hypertensive urgency, hyperkalemia and was transferred to [facility] for dialysis. CXR 11/8: Progressed bilateral multilobar airspace opacity now with early  consolidation suspected. Differential considerations include  asymmetric pulmonary edema, bilateral pneumonia, developing ARDS. Pt found to have influenza A. PMH: ESRD on HD MWF, CKD, CVA, HTN, Etoh abuse tobacco abuse, anemia. Type of Study: Bedside Swallow Evaluation Previous Swallow Assessment: none Diet Prior to this Study: NPO Temperature Spikes Noted: No Respiratory Status: Nasal cannula History of Recent Intubation: No Behavior/Cognition: Alert;Cooperative;Pleasant mood Oral Cavity Assessment: Within Functional Limits Oral Care Completed by SLP: No Oral Cavity - Dentition: Adequate natural dentition Vision: Functional for self-feeding Self-Feeding Abilities: Able to feed self Patient Positioning: Upright in bed;Postural control adequate for testing Baseline Vocal Quality: Normal Volitional Cough: Strong;Congested Volitional Swallow: Able to elicit    Oral/Motor/Sensory Function Overall  Oral Motor/Sensory Function: Within  functional limits   Ice Chips Ice chips: Within functional limits Presentation: Spoon   Thin Liquid Thin Liquid: Impaired Presentation: Cup;Straw Pharyngeal  Phase Impairments: Cough - Immediate    Nectar Thick Nectar Thick Liquid: Not tested   Honey Thick Honey Thick Liquid: Not tested   Puree Puree: Within functional limits Presentation: Spoon   Solid     Solid: Within functional limits     Olar Santini I. Hardin Negus, [place], [place] Office number [phone]  Horton Marshall 06/10/2022,10:54 AM

## 2022-06-10 NOTE — Progress Notes (Signed)
During assessment, coarse crackles appreciated in upper airway. Spoke with PCCM and NT suction PRN order placed. Pt NT suctioned and copious amounts of thick, tan mucous removed. Pt's WOB WNL and removed off of HHFNC 25L 60% and placed on a HFNC [place] at 8L to help encourage pt to be able to cough up secretions better. SATs 96% currently. RRT will continue to monitor and provide therapy.

## 2022-06-10 NOTE — Progress Notes (Signed)
Modified Barium Swallow Progress Note  Patient Details  Name: Cody Macdonald MRN: [id number] Date of Birth: 07-Mar-1961  Today's Date: 06/10/2022  Modified Barium Swallow completed.  Full report located under Chart Review in the Imaging Section.  Brief recommendations include the following:  Clinical Impression  Pt presents with oropharyngeal dysphagia characterized by a pharyngeal delay and reduction in bolus cohesion, posterior bolus propulsion, and anterior laryngeal movement. He demonstrated premature spillage to the valleculae and pyriform sinuses, pyriform sinus residue, and oral holding with purees which necessitated suctioning of this consistency. SLP questions the flavor of the barium barium being a contributing factor to his oral holding since this was not observed at bedside with puree. Coughing was noted during the study, but no instances of penetration/aspiration were observed despite intake of large boluses and consecutive swallows of thin liquids. A regular texture diet with thin liquids will be initiated at this time and SLP will follow briefly to ensure tolerance.   Swallow Evaluation Recommendations       SLP Diet Recommendations: Thin liquid;Regular solids   Liquid Administration via: Cup;Straw   Medication Administration: Crushed with puree   Supervision: Staff to assist with self feeding;Full supervision/cueing for compensatory strategies   Compensations: Slow rate   Postural Changes: Remain semi-upright after after feeds/meals (Comment)   Oral Care Recommendations: Oral care BID      Latisha Lasch I. Hardin Negus, [place], [place] Office number [place] 06/10/2022,2:53 PM

## 2022-06-11 ENCOUNTER — Inpatient Hospital Stay (HOSPITAL_COMMUNITY): Payer: Medicaid Other

## 2022-06-11 DIAGNOSIS — A419 Sepsis, unspecified organism: Secondary | ICD-10-CM

## 2022-06-11 DIAGNOSIS — J9601 Acute respiratory failure with hypoxia: Secondary | ICD-10-CM

## 2022-06-11 DIAGNOSIS — J189 Pneumonia, unspecified organism: Secondary | ICD-10-CM | POA: Diagnosis not present

## 2022-06-11 DIAGNOSIS — J111 Influenza due to unidentified influenza virus with other respiratory manifestations: Secondary | ICD-10-CM | POA: Diagnosis not present

## 2022-06-11 DIAGNOSIS — R6520 Severe sepsis without septic shock: Secondary | ICD-10-CM

## 2022-06-11 LAB — BLOOD GAS, ARTERIAL
Acid-Base Excess: 4.7 mmol/L — ABNORMAL HIGH (ref 0.0–2.0)
Bicarbonate: 28.2 mmol/L — ABNORMAL HIGH (ref 20.0–28.0)
Drawn by: 24487
O2 Saturation: 96.5 %
Patient temperature: 36.7
pCO2 arterial: 37 mmHg (ref 32–48)
pH, Arterial: 7.49 — ABNORMAL HIGH (ref 7.35–7.45)
pO2, Arterial: 69 mmHg — ABNORMAL LOW (ref 83–108)

## 2022-06-11 LAB — CBC
HCT: 28.5 % — ABNORMAL LOW (ref 39.0–52.0)
Hemoglobin: 9 g/dL — ABNORMAL LOW (ref 13.0–17.0)
MCH: 28.4 pg (ref 26.0–34.0)
MCHC: 31.6 g/dL (ref 30.0–36.0)
MCV: 89.9 fL (ref 80.0–100.0)
Platelets: 187 10*3/uL (ref 150–400)
RBC: 3.17 MIL/uL — ABNORMAL LOW (ref 4.22–5.81)
RDW: 14.6 % (ref 11.5–15.5)
WBC: 4.2 10*3/uL (ref 4.0–10.5)
nRBC: 0 % (ref 0.0–0.2)

## 2022-06-11 LAB — GLUCOSE, CAPILLARY: Glucose-Capillary: 89 mg/dL (ref 70–99)

## 2022-06-11 LAB — BASIC METABOLIC PANEL
Anion gap: 14 (ref 5–15)
BUN: 30 mg/dL — ABNORMAL HIGH (ref 6–20)
CO2: 26 mmol/L (ref 22–32)
Calcium: 8.1 mg/dL — ABNORMAL LOW (ref 8.9–10.3)
Chloride: 92 mmol/L — ABNORMAL LOW (ref 98–111)
Creatinine, Ser: 3.43 mg/dL — ABNORMAL HIGH (ref 0.61–1.24)
GFR, Estimated: 20 mL/min — ABNORMAL LOW (ref >60)
Glucose, Bld: 84 mg/dL (ref 70–99)
Potassium: 3.7 mmol/L (ref 3.5–5.1)
Sodium: 132 mmol/L — ABNORMAL LOW (ref 135–145)

## 2022-06-11 LAB — PROCALCITONIN: Procalcitonin: >150 ng/mL

## 2022-06-11 LAB — HEPATITIS B SURFACE ANTIBODY, QUANTITATIVE: Hep B S AB Quant (Post): 133.6 m[IU]/mL (ref >9.9)

## 2022-06-11 MED ORDER — SEVELAMER CARBONATE 800 MG PO TABS
1600.0000 mg | ORAL_TABLET | Freq: Three times a day (TID) | ORAL | Status: DC
  Administered 2022-06-11 – 2022-06-15 (×5): 1600 mg via ORAL
  Filled 2022-06-11 (×9): qty 2

## 2022-06-11 MED ORDER — RENA-VITE PO TABS
1.0000 | ORAL_TABLET | Freq: Every day | ORAL | Status: DC
  Administered 2022-06-12 – 2022-06-19 (×2): 1 via ORAL
  Filled 2022-06-11 (×4): qty 1

## 2022-06-11 MED ORDER — ENSURE ENLIVE PO LIQD
237.0000 mL | Freq: Two times a day (BID) | ORAL | Status: DC
  Administered 2022-06-14: 237 mL via ORAL
  Administered 2022-06-15: 120 mL via ORAL
  Administered 2022-06-16: 237 mL via ORAL

## 2022-06-11 MED ORDER — ATORVASTATIN CALCIUM 80 MG PO TABS
80.0000 mg | ORAL_TABLET | Freq: Every day | ORAL | Status: DC
  Administered 2022-06-11 – 2022-06-14 (×2): 80 mg via ORAL
  Filled 2022-06-11 (×4): qty 1

## 2022-06-11 NOTE — Progress Notes (Addendum)
[facility]  PROGRESS NOTE  Cody Macdonald ZOX:[id number] DOB: 1961/04/14 DOA: 06/08/2022 PCP: Merryl Hacker, No   Brief HPI:   61 year old male with history of stroke, tobacco abuse, hypertension ESRD on hemodialysis Monday Wednesday Friday, came to ED from skilled nursing facility with dyspnea, hypertensive urgency, hyperkalemia.  He required 10 L HFNC in the ED to maintain O2 sats more than 90%, chest x-ray showed pulmonary edema.  COVID-19 was negative.  SBP was elevated.  Nephrology was consulted for emergent hemodialysis. Patient was started on Cleviprex and also started on antibiotics vancomycin and cefepime.  Antibiotics were started. Patient transferred to [facility] service [place] assumed care on 06/11/2022    Subjective   Patient seen examined, feels lethargic.  Denies chest pain or shortness of breath.   Assessment/Plan:   Acute hypoxemic respiratory failure -Secondary to influenza, pulmonary edema?  HCAP = Chest x-ray was concerning for influenza; treated with Tamiflu -Patient was started on cefepime, changed to ceftriaxone -Volume management per hemodialysis  Hypertensive urgency -Resolved' likely from missing hemodialysis session -Patient started on amlodipine, hydralazine, labetalol -Has been weaned off Cleviprex -Hypotensive this morning, will hold amlodipine and hydralazine  ESR the on hemodialysis -Patient on hemodialysis MWF -Nephrology following  History of CVA with residual left-sided weakness -Continue aspirin, Plavix -Patient is currently on dysphagia 2 diet -Tolerating diet well  Anemia of chronic disease -Hemoglobin is stable at 9.0 -Continue monitoring H&H       Medications     acetaminophen  1,000 mg Oral Once   amLODipine  10 mg Oral Daily   aspirin  81 mg Oral Daily   Chlorhexidine Gluconate Cloth  6 each Topical Q0600   clopidogrel  75 mg Oral Daily   darbepoetin (ARANESP) injection - DIALYSIS  60 mcg Subcutaneous Q Thu-1800   diclofenac  Sodium  4 g Topical Once   heparin  5,000 Units Subcutaneous Q8H   hydrALAZINE  50 mg Oral Q8H   labetalol  300 mg Oral BID   mouth rinse  15 mL Mouth Rinse 4 times per day   pantoprazole  40 mg Oral Daily   sevelamer carbonate  800 mg Oral TID WC     Data Reviewed:   CBG:  Recent Labs  Lab 06/09/22 2323 06/10/22 0325 06/10/22 0751 06/10/22 1112 06/10/22 1522  GLUCAP 79 72 79 87 97    SpO2: 97 % O2 Flow Rate (L/min): 8 L/min FiO2 (%): 55 %    Vitals:   06/11/22 0153 06/11/22 0248 06/11/22 0505 06/11/22 0827  BP:  118/60 97/64 110/68  Pulse:  86 82 77  Resp:   20 20  Temp: 98.4 F (36.9 C)  98.1 F (36.7 C) 98.1 F (36.7 C)  TempSrc: Oral  Axillary Oral  SpO2:   99% 97%  Weight:   49.5 kg   Height:          Data Reviewed:  Basic Metabolic Panel: Recent Labs  Lab 06/08/22 1132 06/09/22 0647 06/09/22 1100 06/10/22 0509 06/11/22 0512  NA 138 136 134* 132* 132*  K 6.6* 3.8 4.2 4.8 3.7  CL 99 97*  --  93* 92*  CO2 20* 24  --  22 26  GLUCOSE 57* 86  --  74 84  BUN 44* 29*  --  55* 30*  CREATININE 6.56* 3.51*  --  5.47* 3.43*  CALCIUM 9.6 9.1  --  8.3* 8.1*  MG  --  1.7  --  1.7  --   PHOS  --  3.6  --  7.7*  --     CBC: Recent Labs  Lab 06/08/22 1132 06/09/22 0647 06/09/22 1100 06/10/22 0509 06/11/22 0512  WBC 13.2* 11.6*  --  5.8 4.2  NEUTROABS 11.4*  --   --   --   --   HGB 10.5* 10.4* 11.2* 9.0* 9.0*  HCT 36.5* 30.4* 33.0* 28.2* 28.5*  MCV 101.1* 87.6  --  90.7 89.9  PLT 245 217  --  174 187    LFT Recent Labs  Lab 06/08/22 1132  AST 46*  ALT 20  ALKPHOS 72  BILITOT 0.6  PROT 8.4*  ALBUMIN 3.8     Antibiotics: Anti-infectives (From admission, onward)    Start     Dose/Rate Route Frequency Ordered Stop   06/10/22 2100  oseltamivir (TAMIFLU) capsule 30 mg        30 mg Oral  Once 06/10/22 0825 06/11/22 0517   06/10/22 1030  cefTRIAXone (ROCEPHIN) 2 g in sodium chloride 0.9 % 100 mL IVPB        2 g 200 mL/hr over 30  Minutes Intravenous Every 24 hours 06/10/22 0945 06/15/22 1029   06/09/22 2200  ceFEPIme (MAXIPIME) 1 g in sodium chloride 0.9 % 100 mL IVPB  Status:  Discontinued        1 g 200 mL/hr over 30 Minutes Intravenous Every 24 hours 06/08/22 1611 06/10/22 0945   06/09/22 1015  oseltamivir (TAMIFLU) capsule 30 mg        30 mg Oral  Once 06/09/22 0920 06/09/22 1045   06/08/22 1830  oseltamivir (TAMIFLU) capsule 30 mg  Status:  Discontinued        30 mg Oral  Once 06/08/22 1818 06/09/22 0920   06/08/22 1612  vancomycin variable dose per unstable renal function (pharmacist dosing)  Status:  Discontinued         Does not apply See admin instructions 06/08/22 1612 06/09/22 0920   06/08/22 1100  vancomycin (VANCOCIN) IVPB 1000 mg/200 mL premix        1,000 mg 200 mL/hr over 60 Minutes Intravenous  Once 06/08/22 1057 06/08/22 1329   06/08/22 1100  ceFEPIme (MAXIPIME) 2 g in sodium chloride 0.9 % 100 mL IVPB        2 g 200 mL/hr over 30 Minutes Intravenous  Once 06/08/22 1057 06/08/22 1225        DVT prophylaxis: Heparin  Code Status: Full code  Family Communication:    CONSULTS nephrology, PCCM   Objective    Physical Examination:   General: Appears lethargic Cardiovascular: S1-S2, regular, no murmur auscultated Respiratory: Clear to auscultation bilaterally Abdomen: Abdomen is soft, nontender, no organomegaly Extremities: No edema in the lower extremities Neurologic: Alert, oriented x3, intact insight judgment, no focal deficit noted   Status is: Inpatient:             [place]   [facility] If 7PM-7AM, please contact night-coverage at [email or web address], Office  [phone]   06/11/2022, 10:20 AM  LOS: 3 days

## 2022-06-11 NOTE — Progress Notes (Signed)
Pt receives out-pt HD at [facility] on MWF. Pt has an 11:15 chair time. Clinic advised that pt tested positive for flu on admission. Will assist as needed.  Melven Sartorius Renal Navigator [phone]

## 2022-06-11 NOTE — Progress Notes (Signed)
Nutrition Follow-up  DOCUMENTATION CODES:   Underweight, Severe malnutrition in context of chronic illness  INTERVENTION:  - DYS 2 diet per SLP - Ensure Enlive po BID, each supplement provides 350 kcal and 20 grams of protein. - Rena-vit daily to support micronutrient needs - Renvela per MD  - Recommend close monitoring of oral intake through the weekend and if patient eating poorly would recommend Cortrak on Monday - if within patient's/family's goals of care.  - If plan for Cortak and tube feeds, would recommend:  START: Nepro at 28mL/hr with 22mL Q24H Q24H advancements (due to risk of refeeding). Goal of 2mL/hr + PS x1 = 1780 kcal, 100g protein, 685mL free water  - 100mg  thiamine x7 days  - Patient at high risk for refeeding syndrome and will need close monitoring of electrolytes potassium, magnesium, and phosphorus BID.   NUTRITION DIAGNOSIS:   Severe Malnutrition related to chronic illness (ESRD on HD) as evidenced by severe muscle depletion, severe fat depletion.  GOAL:   Patient will meet greater than or equal to 90% of their needs  MONITOR:   Diet advancement, Weight trends, Supplement acceptance, Labs  REASON FOR ASSESSMENT:   Rounds    ASSESSMENT:   Pt with hx of ESRD on HD, hx CVA, HTN, and hx of EtOH and tobacco abuse presented to ED from SNF with SOB, hyperkalemia, and in HTN crisis.  Patient somewhat lethargic during visit. Would occasionally nod yes to questions but supplied no verbal answers. Did not respond to UBW or recent changes. Nodded head that yes when asked if he was hungry and yes to if he would like to receive Ensure. When asked if he was drinking Ensure PTA patient also nodded yes. Encouraged intake of Ensure and 3 meals a day.  Patient had repeat SLP eval today and recommended to continue on DYS 2 diet. However, RN reporting patient more lethargic this afternoon but tolerating diet fairly.   Per discussion with MD, no plan for Cortrak at this  time now that patient on a diet. Recommended close monitoring of intake through the weekend and if patient eating poorly would recommend Cortrak on Monday (if within patient's/family's goals of care) due to malnutrition. Goals of care discussions remain ongoing.   Medications reviewed and include: Protonix, Renvela  Labs reviewed:  Na 132 Creatinine 3.43 GFR 20   Diet Order:   Diet Order             Diet regular Room service appropriate? Yes with Assist; Fluid consistency: Thin  Diet effective now                   EDUCATION NEEDS:   Not appropriate for education at this time  Skin:  Skin Assessment: Reviewed RN Assessment  Last BM:  11/8  Height:  Ht Readings from Last 1 Encounters:  06/09/22 5\' 8"  (1.727 m)   Weight:  Wt Readings from Last 1 Encounters:  06/11/22 49.5 kg    Ideal Body Weight:  70 kg  BMI:  Body mass index is 16.59 kg/m.  Estimated Nutritional Needs:  Kcal:  1700-1900 kcal/d Protein:  80-100 g/d Fluid:  1L+UOP    Samson Frederic RD, LDN For contact information, refer to [facility].

## 2022-06-11 NOTE — Progress Notes (Signed)
  X-cover Note: Messaged by Physiological scientist. Pt with tachypnea and increase O2 requirement. Transferred out of ICU today. [facility] assumed care today.  Will check ABG and CXR. May need to go back on bipap.   Kristopher Oppenheim, DO [facility]

## 2022-06-11 NOTE — Progress Notes (Addendum)
Speech Language Pathology Treatment: Dysphagia  Patient Details Name: Cody Macdonald MRN: [id number] DOB: September 22, 1960 Today's Date: 06/11/2022 Time: 1229-1259 SLP Time Calculation (min) (ACUTE ONLY): 30 min  Assessment / Plan / Recommendation Clinical Impression  Pt was seen for dysphagia treatment. He was adequately alert, but verbal output was minimal, and processing speed appeared more prolonged than on 11/9. Pt's NT and RN reported that the pt has been taking a very long time to chew and that he has been pocketing food. Pt was seen soon after lunch and significant pocketing (mostly meat and some rice) was noted in the anterior and lateral sulci; anterior sulcus was full and lateral sulci were at >50% full. Pt tolerated solids and liquids without overt s/s of aspiration. However, mastication was significantly prolonged and mastication/bolus manipulation persisted despite adequate reduction in bolus texture. Pt's oral phase more functional with dysphagia 2 and 3 boluses with improved oral clearance and more functional mastication. However, SLP questions whether mastication of dysphagia 3 meats will be similar to that noted with the softer meat today. A dysphagia 2 diet with thin liquids is recommended and SLP will continue to follow pt.     HPI HPI: Pt is a 61 y.o. male who presented to [facility] ED 11/7 with dyspnea, hypertensive urgency, hyperkalemia and was transferred to [facility] for dialysis. CXR 11/8: Progressed bilateral multilobar airspace opacity now with early  consolidation suspected. Differential considerations include  asymmetric pulmonary edema, bilateral pneumonia, developing ARDS. Pt found to have influenza A. PMH: ESRD on HD MWF, CKD, CVA, HTN, Etoh abuse tobacco abuse, anemia.      SLP Plan  Continue with current plan of care      Recommendations for follow up therapy are one component of a multi-disciplinary discharge planning process, led by the attending physician.  Recommendations may  be updated based on patient status, additional functional criteria and insurance authorization.    Recommendations  Diet recommendations: Dysphagia 2 (fine chop);Thin liquid Liquids provided via: Cup;Straw Medication Administration: Whole meds with puree Supervision: Patient able to self feed Compensations: Slow rate;Small sips/bites Postural Changes and/or Swallow Maneuvers: Seated upright 90 degrees                Oral Care Recommendations: Oral care BID Follow Up Recommendations:  (TBD) Assistance recommended at discharge: Frequent or constant Supervision/Assistance SLP Visit Diagnosis: Dysphagia, unspecified (R13.10) Plan: Continue with current plan of care          Aiyana Stegmann I. Hardin Negus, [place], [place] Office number [phone]  Horton Marshall  06/11/2022, 1:16 PM

## 2022-06-11 NOTE — Progress Notes (Signed)
Nephrology Follow-Up Consult note   Assessment/Recommendations: Cody Macdonald is a/an 61 y.o. male with a past medical history significant for ESRD, admitted for AHRF and influenza.       1.  Acute hypoxic respiratory failure: This appears to be from a combination of pulmonary edema/volume overload with bronchospasm along with influenza a/possible HCAP.  O2 requirements have overall improved with volume removal and treatment of underlying issue 2.  Hyperkalemia: Resolved with dialysis 3.  End-stage renal disease: Usually on Monday/Wednesday/Friday dialysis schedule.  Got dialysis on 06/09/2022 and 11/9.  Hold on dialysis today and likely plan for dialysis tomorrow then resume Monday Wednesday Friday schedule next week. 4.  Hypertension: Blood pressure overall improved.  Continue with volume removal as tolerated 5.  Secondary hyperparathyroidism: Home binders ordered 6.  Anemia of chronic disease: Hemoglobin 9.  ESA ordered for 06/10/2022.  No iron for now   Recommendations conveyed to primary service.    [facility] 06/11/2022 9:24 AM  ___________________________________________________________  CC: SOB  Interval History/Subjective: Tolerated dialysis again yesterday with 2.5 L removed.  Oxygen status improving.  Some shortness of breath but denies other complaints   Medications:  Current Facility-Administered Medications  Medication Dose Route Frequency Provider Last Rate Last Admin   acetaminophen (TYLENOL) tablet 1,000 mg  1,000 mg Oral Once Carmin Muskrat, MD       albuterol (PROVENTIL) (2.5 MG/3ML) 0.083% nebulizer solution 2.5 mg  2.5 mg Nebulization Q3H PRN Shearon Stalls, Rahul P, PA-C       amLODipine (NORVASC) tablet 10 mg  10 mg Oral Daily Estill Cotta, NP   10 mg at 06/10/22 0950   aspirin chewable tablet 81 mg  81 mg Oral Daily Estill Cotta, NP   81 mg at 06/10/22 0950   cefTRIAXone (ROCEPHIN) 2 g in sodium chloride 0.9 % 100 mL  IVPB  2 g Intravenous Q24H Estill Cotta, NP 200 mL/hr at 06/10/22 1135 2 g at 06/10/22 1135   Chlorhexidine Gluconate Cloth 2 % PADS 6 each  6 each Topical Q0600 Elmarie Shiley, MD   6 each at 06/11/22 0518   clopidogrel (PLAVIX) tablet 75 mg  75 mg Oral Daily Estill Cotta, NP   75 mg at 06/10/22 0950   Darbepoetin Alfa (ARANESP) injection 60 mcg  60 mcg Subcutaneous Q Thu-1800 Reesa Chew, MD   60 mcg at 06/11/22 3491   diclofenac Sodium (VOLTAREN) 1 % topical gel 4 g  4 g Topical Once Anders Simmonds, MD       docusate sodium (COLACE) capsule 100 mg  100 mg Oral BID PRN Shearon Stalls, Rahul P, PA-C       heparin injection 5,000 Units  5,000 Units Subcutaneous Q8H Desai, Rahul P, PA-C   5,000 Units at 06/11/22 0248   hydrALAZINE (APRESOLINE) injection 10-40 mg  10-40 mg Intravenous Q4H PRN Desai, Rahul P, PA-C   20 mg at 06/09/22 0257   hydrALAZINE (APRESOLINE) tablet 50 mg  50 mg Oral Q8H Estill Cotta, NP   50 mg at 06/11/22 0248   labetalol (NORMODYNE) injection 10 mg  10 mg Intravenous Q10 min PRN Carmin Muskrat, MD   10 mg at 06/08/22 2041   labetalol (NORMODYNE) tablet 300 mg  300 mg Oral BID Estill Cotta, NP   300 mg at 06/11/22 0248   ondansetron (ZOFRAN) injection 4 mg  4 mg Intravenous Q8H PRN Estill Cotta, NP   4 mg at 06/09/22  1349   Oral care mouth rinse  15 mL Mouth Rinse 4 times per day Brand Males, MD   15 mL at 06/10/22 2105   Oral care mouth rinse  15 mL Mouth Rinse PRN Brand Males, MD       pantoprazole (PROTONIX) EC tablet 40 mg  40 mg Oral Daily Estill Cotta, NP   40 mg at 06/10/22 0950   polyethylene glycol (MIRALAX / GLYCOLAX) packet 17 g  17 g Oral Daily PRN Shearon Stalls, Rahul P, PA-C       sevelamer carbonate (RENVELA) tablet 800 mg  800 mg Oral TID WC Reesa Chew, MD   800 mg at 06/10/22 1703      Review of Systems: 10 systems reviewed and negative except per interval history/subjective  Physical Exam: Vitals:   06/11/22 0505  06/11/22 0827  BP: 97/64 110/68  Pulse: 82 77  Resp: 20 20  Temp: 98.1 F (36.7 C) 98.1 F (36.7 C)  SpO2: 99% 97%   No intake/output data recorded.  Intake/Output Summary (Last 24 hours) at 06/11/2022 4034 Last data filed at 06/11/2022 0205 Gross per 24 hour  Intake --  Output 2500 ml  Net -2500 ml   Constitutional: tired appearing, nad ENMT: ears and nose without scars or lesions, MMM CV: normal rate, no edema Respiratory: bilateral chest rise, mild iwob Gastrointestinal: soft, non-tender, no palpable masses or hernias Skin: no visible lesions or rashes Psych: alert, judgement/insight appropriate, appropriate mood and affect   Test Results I personally reviewed new and old clinical labs and radiology tests Lab Results  Component Value Date   NA 132 (L) 06/11/2022   K 3.7 06/11/2022   CL 92 (L) 06/11/2022   CO2 26 06/11/2022   BUN 30 (H) 06/11/2022   CREATININE 3.43 (H) 06/11/2022   CALCIUM 8.1 (L) 06/11/2022   ALBUMIN 3.8 06/08/2022   PHOS 7.7 (H) 06/10/2022    CBC Recent Labs  Lab 06/08/22 1132 06/09/22 0647 06/09/22 1100 06/10/22 0509 06/11/22 0512  WBC 13.2* 11.6*  --  5.8 4.2  NEUTROABS 11.4*  --   --   --   --   HGB 10.5* 10.4* 11.2* 9.0* 9.0*  HCT 36.5* 30.4* 33.0* 28.2* 28.5*  MCV 101.1* 87.6  --  90.7 89.9  PLT 245 217  --  174 187

## 2022-06-11 NOTE — Progress Notes (Signed)
Patient appears lethargic responding to voice. Vital signs BP 82/58 HR 72 Respirations 36 , temp 98.4 oxygen Level 98 % HFNC @8l   rhonchus breath sounds noted. Dr. Darrick Meigs notified and  hydralazine medication held at this time.

## 2022-06-12 DIAGNOSIS — J111 Influenza due to unidentified influenza virus with other respiratory manifestations: Secondary | ICD-10-CM | POA: Diagnosis not present

## 2022-06-12 DIAGNOSIS — A419 Sepsis, unspecified organism: Secondary | ICD-10-CM | POA: Diagnosis not present

## 2022-06-12 DIAGNOSIS — E43 Unspecified severe protein-calorie malnutrition: Secondary | ICD-10-CM | POA: Insufficient documentation

## 2022-06-12 DIAGNOSIS — J9601 Acute respiratory failure with hypoxia: Secondary | ICD-10-CM | POA: Diagnosis not present

## 2022-06-12 DIAGNOSIS — J189 Pneumonia, unspecified organism: Secondary | ICD-10-CM | POA: Diagnosis not present

## 2022-06-12 LAB — COMPREHENSIVE METABOLIC PANEL
ALT: 97 U/L — ABNORMAL HIGH (ref 0–44)
AST: 312 U/L — ABNORMAL HIGH (ref 15–41)
Albumin: 2.7 g/dL — ABNORMAL LOW (ref 3.5–5.0)
Alkaline Phosphatase: 57 U/L (ref 38–126)
Anion gap: 15 (ref 5–15)
BUN: 55 mg/dL — ABNORMAL HIGH (ref 6–20)
CO2: 23 mmol/L (ref 22–32)
Calcium: 7.8 mg/dL — ABNORMAL LOW (ref 8.9–10.3)
Chloride: 94 mmol/L — ABNORMAL LOW (ref 98–111)
Creatinine, Ser: 5.36 mg/dL — ABNORMAL HIGH (ref 0.61–1.24)
GFR, Estimated: 11 mL/min — ABNORMAL LOW (ref >60)
Glucose, Bld: 80 mg/dL (ref 70–99)
Potassium: 4.4 mmol/L (ref 3.5–5.1)
Sodium: 132 mmol/L — ABNORMAL LOW (ref 135–145)
Total Bilirubin: 0.3 mg/dL (ref 0.3–1.2)
Total Protein: 6.5 g/dL (ref 6.5–8.1)

## 2022-06-12 LAB — CBC WITH DIFFERENTIAL/PLATELET
Abs Immature Granulocytes: 0 10*3/uL (ref 0.00–0.07)
Basophils Absolute: 0 10*3/uL (ref 0.0–0.1)
Basophils Relative: 1 %
Eosinophils Absolute: 0 10*3/uL (ref 0.0–0.5)
Eosinophils Relative: 0 %
HCT: 29.1 % — ABNORMAL LOW (ref 39.0–52.0)
Hemoglobin: 9.3 g/dL — ABNORMAL LOW (ref 13.0–17.0)
Lymphocytes Relative: 19 %
Lymphs Abs: 0.7 10*3/uL (ref 0.7–4.0)
MCH: 28.6 pg (ref 26.0–34.0)
MCHC: 32 g/dL (ref 30.0–36.0)
MCV: 89.5 fL (ref 80.0–100.0)
Monocytes Absolute: 0.2 10*3/uL (ref 0.1–1.0)
Monocytes Relative: 4 %
Neutro Abs: 3 10*3/uL (ref 1.7–7.7)
Neutrophils Relative %: 76 %
Platelets: 174 10*3/uL (ref 150–400)
RBC: 3.25 MIL/uL — ABNORMAL LOW (ref 4.22–5.81)
RDW: 14.9 % (ref 11.5–15.5)
WBC: 3.9 10*3/uL — ABNORMAL LOW (ref 4.0–10.5)
nRBC: 0 % (ref 0.0–0.2)
nRBC: 0 /100 WBC

## 2022-06-12 LAB — BLOOD GAS, ARTERIAL
Acid-Base Excess: 2.9 mmol/L — ABNORMAL HIGH (ref 0.0–2.0)
Bicarbonate: 25.7 mmol/L (ref 20.0–28.0)
Drawn by: 24487
O2 Saturation: 98.1 %
Patient temperature: 36.5
pCO2 arterial: 32 mmHg (ref 32–48)
pH, Arterial: 7.51 — ABNORMAL HIGH (ref 7.35–7.45)
pO2, Arterial: 76 mmHg — ABNORMAL LOW (ref 83–108)

## 2022-06-12 LAB — MAGNESIUM: Magnesium: 1.8 mg/dL (ref 1.7–2.4)

## 2022-06-12 LAB — LACTIC ACID, PLASMA
Lactic Acid, Venous: 1.5 mmol/L (ref 0.5–1.9)
Lactic Acid, Venous: 1.2 mmol/L (ref 0.5–1.9)

## 2022-06-12 LAB — PROCALCITONIN: Procalcitonin: >150 ng/mL

## 2022-06-12 MED ORDER — OSELTAMIVIR PHOSPHATE 30 MG PO CAPS
30.0000 mg | ORAL_CAPSULE | Freq: Once | ORAL | Status: AC
  Administered 2022-06-12: 30 mg via ORAL
  Filled 2022-06-12: qty 1

## 2022-06-12 MED ORDER — HEPARIN SODIUM (PORCINE) 1000 UNIT/ML IJ SOLN
INTRAMUSCULAR | Status: AC
  Administered 2022-06-12: 3200 [IU]
  Filled 2022-06-12: qty 4

## 2022-06-12 NOTE — Progress Notes (Signed)
Ate 25% lunch and drank approx 11ml with out difficulty.

## 2022-06-12 NOTE — Progress Notes (Addendum)
Nephrology Follow-Up Consult note   Assessment/Recommendations: Cody Macdonald is a/an 61 y.o. male with a past medical history significant for ESRD, admitted for AHRF and influenza.     Subjective: Tolerated dialysis again yesterday with 2.5 L removed.  Oxygen status improving.  Some shortness of breath but denies other complaints  Physical Exam: Vitals:   06/12/22 0936 06/12/22 1200  BP: (!) 145/78 131/74  Pulse: 88 79  Resp:  (!) 24  Temp:  98.2 F (36.8 C)  SpO2:  97%  Constitutional: tired appearing, nonverbal today, not sure baseline CV: normal rate, no edema Respiratory: bilateral chest rise, mild iwob Gastrointestinal: soft, non-tender, no palpable masses  Skin: no visible lesions or rashes Ext: no edema bilat  Psych: alert but nonverbal, not answering questions LUA AVG+bruit/ RIJ TDC  OP HD: MWF at [address]  3.5h  48kg  2.2/5 bath  300/600  Hep none R AVG/ TDC (using AVG)   CXR 11/10- bilat splotchy perihilar disease, edema vs infxn, improved compared to 11/8 film  Assessment/ Plan: 1.  Acute hypoxic respiratory failure: due to combination of pulmonary edema/volume overload with bronchospasm along with influenza +/- possible HCAP.  O2 requirements have overall improved with volume removal and IV abx.  2.  Hyperkalemia: Resolved with dialysis 3.  ESRD: on HD MWF.  Got dialysis on 11/8 and 11/9 here. Plan is for HD today then resume MWF schedule.  4.  HTN/ vol: Blood pressure good.  3L and 2.5 L UF w/ last 2 sessions here. Close to dry wt (1.5kg up). UF 2-3 L w/ HD today. Lower dry wt if tolerated.  5.  Secondary hyperparathyroidism: Home binders ordered 6.  Anemia of chronic disease: Hemoglobin 9.  ESA ordered for 06/10/2022.  No iron for now   [name], MD 06/12/2022, 1:58 PM  Recent Labs  Lab 06/08/22 1132 06/09/22 0647 06/09/22 1100 06/10/22 0509 06/11/22 0512 06/12/22 0403  HGB 10.5* 10.4*   < > 9.0* 9.0* 9.3*  ALBUMIN 3.8  --   --   --   --   2.7*  CALCIUM 9.6 9.1  --  8.3* 8.1* 7.8*  PHOS  --  3.6  --  7.7*  --   --   CREATININE 6.56* 3.51*  --  5.47* 3.43* 5.36*  K 6.6* 3.8   < > 4.8 3.7 4.4   < > = values in this interval not displayed.    Inpatient medications:  acetaminophen  1,000 mg Oral Once   aspirin  81 mg Oral Daily   atorvastatin  80 mg Oral Daily   Chlorhexidine Gluconate Cloth  6 each Topical Q0600   clopidogrel  75 mg Oral Daily   darbepoetin (ARANESP) injection - DIALYSIS  60 mcg Subcutaneous Q Thu-1800   diclofenac Sodium  4 g Topical Once   feeding supplement  237 mL Oral BID BM   heparin  5,000 Units Subcutaneous Q8H   hydrALAZINE  50 mg Oral Q8H   labetalol  300 mg Oral BID   multivitamin  1 tablet Oral QHS   mouth rinse  15 mL Mouth Rinse 4 times per day   pantoprazole  40 mg Oral Daily   sevelamer carbonate  1,600 mg Oral TID WC    cefTRIAXone (ROCEPHIN)  IV 2 g (06/12/22 1005)   albuterol, docusate sodium, hydrALAZINE, labetalol, mouth rinse, polyethylene glycol

## 2022-06-12 NOTE — Progress Notes (Addendum)
Received patient in bed to unit.  Alert and responds to voice, remains nonverbal. Informed consent signed and in chart.   Treatment initiated: 1404 Treatment completed: 1610  Patient's b/p did not allow for much uf during tx. Provider was made aware.  Transported back to the room  Alert, without acute distress.  Hand-off given to patient's nurse.   Access used: Catheter  Access issues: none  Total UF removed: 366ml Medication(s) given: none Post HD VS: 103/68,90,25,99%97.7 Post HD weight: 48.5kg   [facility]

## 2022-06-12 NOTE — Progress Notes (Signed)
Received pt from HD, alert not in distress.

## 2022-06-12 NOTE — Progress Notes (Signed)
Pt NT suctioned per MD. Mod amount of white, tan, pink sputum obtained. Pt in no distress  noted.

## 2022-06-12 NOTE — Progress Notes (Addendum)
Respiratory rate in 40s and shallow. Sats 89-90 on 7L O2; increased to 9L. Pt very lethargic. Per bedside report from day RN, pt has become more lethargic this afternoon and RR 30s during day. Pt opens eyes to voice. Follows very simple commands but needs repeat instruction. Makes eye contact with RN but does not speak or nod head yes/no. Day RN reported pt only speaking a couple words at a time during day.   Placed on bipap per MD. Sats stable on bipap but RR continues to fluctuate high 20s-40s on bipap and still shallow after several hours on bipap. In morning, rapid RN and night MD at bedside to see pt. Labs ordered. Per MD placed back on HFNC.      06/11/22 2100  Assess: MEWS Score  BP 100/64  MAP (mmHg) 75  Pulse Rate 80  ECG Heart Rate 81  Resp (!) 44  Level of Consciousness Responds to Voice  SpO2 94 %  O2 Device HFNC  Patient Activity (if Appropriate) In bed  O2 Flow Rate (L/min) 9 L/min  Assess: MEWS Score  MEWS Temp 0  MEWS Systolic 1  MEWS Pulse 0  MEWS RR 3  MEWS LOC 1  MEWS Score 5  MEWS Score Color Red  Assess: if the MEWS score is Yellow or Red  Were vital signs taken at a resting state? Yes  Does the patient meet 2 or more of the SIRS criteria? Yes  Does the patient have a confirmed or suspected source of infection? Yes  Provider and Rapid Response Notified? Yes  MEWS guidelines implemented *See Row Information* Yes  Treat  MEWS Interventions Escalated (See documentation below);Consulted Respiratory Therapy  Pain Scale PAINAD  Breathing 1  Negative Vocalization 0  Facial Expression 0  Body Language 0  Consolability 0  PAINAD Score 1  Take Vital Signs  Increase Vital Sign Frequency  Red: Q 1hr X 4 then Q 4hr X 4, if remains red, continue Q 4hrs  Escalate  MEWS: Escalate Red: discuss with charge nurse/RN and provider, consider discussing with RRT  Notify: Charge Nurse/RN  Name of Charge Nurse/RN Notified Karen, RN  Date Charge Nurse/RN Notified 06/11/22   Time Charge Nurse/RN Notified 2130  Notify: Provider  Provider Name/Title Dr. Milderd Meager  Date Provider Notified 06/11/22  Time Provider Notified 2042  Method of Notification  (page)  Notification Reason Change in status (respiratory rate in 40s and shallow. sats 89-90 on 7L O2. increased to 9L. pt very lethargic.)  Provider response See new orders (MD called to discuss. see orders)  Date of Provider Response 06/11/22  Time of Provider Response 2044  Notify: Rapid Response  Name of Rapid Response RN Notified Mindy RN  Date Rapid Response Notified 06/11/22  Time Rapid Response Notified 2059  Document  Patient Outcome Other (Comment) (no change in RR. MD later at bedside to evaluate.)  Assess: SIRS CRITERIA  SIRS Temperature  0  SIRS Pulse 0  SIRS Respirations  1  SIRS WBC 1  SIRS Score Sum  2

## 2022-06-12 NOTE — Progress Notes (Signed)
[facility]  PROGRESS NOTE  Cody Macdonald:[id number] DOB: [birth year] DOA: 06/08/2022 PCP: Merryl Hacker, No   Brief HPI:   61 year old male with history of stroke, tobacco abuse, hypertension ESRD on hemodialysis Monday Wednesday Friday, came to ED from skilled nursing facility with dyspnea, hypertensive urgency, hyperkalemia.  He required 10 L HFNC in the ED to maintain O2 sats more than 90%, chest x-ray showed pulmonary edema.  COVID-19 was negative.  SBP was elevated.  Nephrology was consulted for emergent hemodialysis. Patient was started on Cleviprex and also started on antibiotics vancomycin and cefepime.  Antibiotics were started. Patient transferred to [facility] service [place] assumed care on 06/11/2022    Subjective   Patient seen and examined, denies any complaints.   Assessment/Plan:   Acute hypoxemic respiratory failure -Secondary to influenza, pulmonary edema?  HCAP = Chest x-ray was concerning for influenza; treated with Tamiflu -Patient was started on cefepime, changed to ceftriaxone -Volume management per hemodialysis  Hypertensive urgency -Resolved' likely from missing hemodialysis session -Patient started on amlodipine, hydralazine, labetalol -Has been weaned off Cleviprex -Patient was hypotensive so amlodipine and hydralazine were held -Has been started back on hydralazine, labetalol; amlodipine is currently on hold  ESR the on hemodialysis -Patient on hemodialysis MWF -Nephrology following  History of CVA with residual left-sided weakness -Continue aspirin, Plavix -Patient is currently on dysphagia 2 diet -Tolerating diet well  Anemia of chronic disease -Hemoglobin is stable at 9.0 -Continue monitoring H&H       Medications     acetaminophen  1,000 mg Oral Once   aspirin  81 mg Oral Daily   atorvastatin  80 mg Oral Daily   Chlorhexidine Gluconate Cloth  6 each Topical Q0600   clopidogrel  75 mg Oral Daily   darbepoetin (ARANESP) injection -  DIALYSIS  60 mcg Subcutaneous Q Thu-1800   diclofenac Sodium  4 g Topical Once   feeding supplement  237 mL Oral BID BM   heparin  5,000 Units Subcutaneous Q8H   hydrALAZINE  50 mg Oral Q8H   labetalol  300 mg Oral BID   multivitamin  1 tablet Oral QHS   mouth rinse  15 mL Mouth Rinse 4 times per day   pantoprazole  40 mg Oral Daily   sevelamer carbonate  1,600 mg Oral TID WC     Data Reviewed:   CBG:  Recent Labs  Lab 06/10/22 0325 06/10/22 0751 06/10/22 1112 06/10/22 1522 06/11/22 2048  GLUCAP 72 79 87 97 89    SpO2: 97 % O2 Flow Rate (L/min): (P) 6 L/min FiO2 (%): 40 %    Vitals:   06/12/22 0800 06/12/22 0936 06/12/22 1200 06/12/22 1356  BP: 137/75 (!) 145/78 131/74   Pulse: 87 88 79   Resp: (!) 25  (!) 24   Temp:   98.2 F (36.8 C) (P) 98.2 F (36.8 C)  TempSrc:   Axillary (P) Axillary  SpO2: 99%  97%   Weight:      Height:          Data Reviewed:  Basic Metabolic Panel: Recent Labs  Lab 06/08/22 1132 06/09/22 0647 06/09/22 1100 06/10/22 0509 06/11/22 0512 06/12/22 0403  NA 138 136 134* 132* 132* 132*  K 6.6* 3.8 4.2 4.8 3.7 4.4  CL 99 97*  --  93* 92* 94*  CO2 20* 24  --  _0 GLUCOSE 57* 86  --  74 84 80  BUN 44* 29*  --  55* 30*  55*  CREATININE 6.56* 3.51*  --  5.47* 3.43* 5.36*  CALCIUM 9.6 9.1  --  8.3* 8.1* 7.8*  MG  --  1.7  --  1.7  --  1.8  PHOS  --  3.6  --  7.7*  --   --     CBC: Recent Labs  Lab 06/08/22 1132 06/09/22 0647 06/09/22 1100 06/10/22 0509 06/11/22 0512 06/12/22 0403  WBC 13.2* 11.6*  --  5.8 4.2 3.9*  NEUTROABS 11.4*  --   --   --   --  3.0  HGB 10.5* 10.4* 11.2* 9.0* 9.0* 9.3*  HCT 36.5* 30.4* 33.0* 28.2* 28.5* 29.1*  MCV 101.1* 87.6  --  90.7 89.9 89.5  PLT 245 217  --  174 187 174    LFT Recent Labs  Lab 06/08/22 1132 06/12/22 0403  AST 46* 312*  ALT 20 97*  ALKPHOS 72 57  BILITOT 0.6 0.3  PROT 8.4* 6.5  ALBUMIN 3.8 2.7*     Antibiotics: Anti-infectives (From admission, onward)     Start     Dose/Rate Route Frequency Ordered Stop   06/10/22 2100  oseltamivir (TAMIFLU) capsule 30 mg        30 mg Oral  Once 06/10/22 0825 06/11/22 0517   06/10/22 1030  cefTRIAXone (ROCEPHIN) 2 g in sodium chloride 0.9 % 100 mL IVPB        2 g 200 mL/hr over 30 Minutes Intravenous Every 24 hours 06/10/22 0945 06/15/22 1029   06/09/22 2200  ceFEPIme (MAXIPIME) 1 g in sodium chloride 0.9 % 100 mL IVPB  Status:  Discontinued        1 g 200 mL/hr over 30 Minutes Intravenous Every 24 hours 06/08/22 1611 06/10/22 0945   06/09/22 1015  oseltamivir (TAMIFLU) capsule 30 mg        30 mg Oral  Once 06/09/22 0920 06/09/22 1045   06/08/22 1830  oseltamivir (TAMIFLU) capsule 30 mg  Status:  Discontinued        30 mg Oral  Once 06/08/22 1818 06/09/22 0920   06/08/22 1612  vancomycin variable dose per unstable renal function (pharmacist dosing)  Status:  Discontinued         Does not apply See admin instructions 06/08/22 1612 06/09/22 0920   06/08/22 1100  vancomycin (VANCOCIN) IVPB 1000 mg/200 mL premix        1,000 mg 200 mL/hr over 60 Minutes Intravenous  Once 06/08/22 1057 06/08/22 1329   06/08/22 1100  ceFEPIme (MAXIPIME) 2 g in sodium chloride 0.9 % 100 mL IVPB        2 g 200 mL/hr over 30 Minutes Intravenous  Once 06/08/22 1057 06/08/22 1225        DVT prophylaxis: Heparin  Code Status: Full code  Family Communication:    CONSULTS nephrology, PCCM   Objective    Physical Examination:   General-appears in no acute distress Heart-S1-S2, regular, no murmur auscultated Lungs-clear to auscultation bilaterally, no wheezing or crackles auscultated Abdomen-soft, nontender, no organomegaly Extremities-no edema in the lower extremities    Status is: Inpatient:             Cody Macdonald   [facility] If 7PM-7AM, please contact night-coverage at [email or web address], Office  [phone]   06/12/2022, 2:31 PM  LOS: 4 days

## 2022-06-12 NOTE — Significant Event (Addendum)
Rapid Response Event Note   Reason for Call :  Notified earlier in night(2059) that pt was tachypneic with increased lethargy t/o the day.   Dr. Bridgett Larsson was notified and orders placed: ABG-7.49/37/69/28.2 on 9L HFNC PCXR-Persistent but improving bilateral perihilar airspace disease, which may reflect resolving infection or edema. Bipap  RRT called at 0212 and asked to eyeball pt d/t continued tachypnea and lethargy  Initial Focused Assessment:  Pt lying in bed with eyes open. Pt will move all extremities to pain but will not speak. He will track RN and follow simple commands. L pupil 4, R pupil 3, both sluggish. Lungs with rhonchi t/o. Skin warm and dry.  T-97.7, HR-82, BP-95/68, RR-45, SpO2-97% on .40 bipap  Interventions:  Repeat ABG-7.51/32/76/25.7 CMP CBC MG NTS suction by RT-mod white/tan/pink sputum Bipap d/c'd Plan of Care:  Pt taken off bipap. Await lab results. Continue to monitor pt closely. Call RRT if further assistance needed.    Event Summary:   MD Notified: Dr. Bridgett Larsson notified and came to bedside Call [place], Kaylanie Capili Anderson, RN

## 2022-06-12 NOTE — Progress Notes (Signed)
  X-cover Note: Pt placed on Bipap earlier during shift due to worsening hypoxia.  Initial ABG showed 7.49, PCO2 37, PO2 69.  After about 4 hour on bipap with TV 350 ml, ABG now 7.51, PCO2 32, PO2 76.  Pt with continue tachypnea with RR in 35-45.  Rhonchi bilaterally. No accessory muscle use. Will open eyes to verbal stimuli but does not follow commands. Pupils reactive to light bilaterally  Have ordered CMP, CBC  Due to persistent respiratory alkalosis, will stop Bipap and change back to supplemental O2. Can also try just CPAP alone for worsening hypoxia.   Kristopher Oppenheim, DO [facility]

## 2022-06-13 DIAGNOSIS — J9601 Acute respiratory failure with hypoxia: Secondary | ICD-10-CM | POA: Diagnosis not present

## 2022-06-13 DIAGNOSIS — J111 Influenza due to unidentified influenza virus with other respiratory manifestations: Secondary | ICD-10-CM | POA: Diagnosis not present

## 2022-06-13 DIAGNOSIS — J189 Pneumonia, unspecified organism: Secondary | ICD-10-CM | POA: Diagnosis not present

## 2022-06-13 DIAGNOSIS — A419 Sepsis, unspecified organism: Secondary | ICD-10-CM | POA: Diagnosis not present

## 2022-06-13 LAB — CULTURE, BLOOD (ROUTINE X 2)
Culture: NO GROWTH
Culture: NO GROWTH
Special Requests: ADEQUATE
Special Requests: ADEQUATE

## 2022-06-13 LAB — GLUCOSE, CAPILLARY: Glucose-Capillary: 86 mg/dL (ref 70–99)

## 2022-06-13 MED ORDER — CHLORHEXIDINE GLUCONATE CLOTH 2 % EX PADS
6.0000 | MEDICATED_PAD | Freq: Every day | CUTANEOUS | Status: DC
  Administered 2022-06-14 – 2022-06-16 (×3): 6 via TOPICAL

## 2022-06-13 NOTE — Progress Notes (Signed)
Speech Language Pathology Treatment: Dysphagia  Patient Details Name: Cody Macdonald MRN: [id number] DOB: 07/04/1961 Today's Date: 06/13/2022 Time: 9381-8299 SLP Time Calculation (min) (ACUTE ONLY): 20 min  Assessment / Plan / Recommendation Clinical Impression  Pt was seen for dysphagia treatment after being contacted by RN due to an apparent decline in swallow function with reports of pt not swallowing; pt was made NPO for these reasons. Pt was alert throughout the session and produced, "I'm okay" in response to a question, but no other verbal output was noted/elicited. Pt demonstrated oral holding with puree for more than 1 minute despite prompts to swallow and suctioning was frequently required. Pt tolerated puree and thin liquids without overt s/s of aspiration and no prompting was necessary for oral clearance. Pt's overall swallow function appears worse than when he was last seen on 11/10. A clear liquid diet with thin liquids is recommended and SLP will continue to follow pt.     HPI HPI: Pt is a 61 y.o. male who presented to [facility] ED 11/7 with dyspnea, hypertensive urgency, hyperkalemia and was transferred to [facility] for dialysis. CXR 11/8: Progressed bilateral multilobar airspace opacity now with early  consolidation suspected. Differential considerations include  asymmetric pulmonary edema, bilateral pneumonia, developing ARDS. Pt found to have influenza A. PMH: ESRD on HD MWF, CKD, CVA, HTN, Etoh abuse tobacco abuse, anemia.      SLP Plan  Continue with current plan of care      Recommendations for follow up therapy are one component of a multi-disciplinary discharge planning process, led by the attending physician.  Recommendations may be updated based on patient status, additional functional criteria and insurance authorization.    Recommendations  Diet recommendations: Thin liquid (clear liquid) Liquids provided via: Cup;Straw Medication Administration: Whole meds with puree  (possibly followed by a bolus of thin liquids) Supervision: Trained caregiver to feed patient;Full supervision/cueing for compensatory strategies Compensations: Slow rate;Small sips/bites Postural Changes and/or Swallow Maneuvers: Seated upright 90 degrees                Oral Care Recommendations: Oral care BID Follow Up Recommendations:  (TBD) Assistance recommended at discharge: Frequent or constant Supervision/Assistance SLP Visit Diagnosis: Dysphagia, unspecified (R13.10) Plan: Continue with current plan of care          Cody Macdonald, [place], [place] Office number [phone]  Horton Marshall  06/13/2022, 3:46 PM

## 2022-06-13 NOTE — Progress Notes (Signed)
Nephrology Follow-Up Consult note   Assessment/Recommendations: Cody Macdonald is a/an 61 y.o. male with a past medical history significant for ESRD, admitted for AHRF and influenza.     Subjective: seen in pt's room. Lying in bed, tracks but not responding verbally.   Physical Exam: Vitals:   06/12/22 0936 06/12/22 1200  BP: (!) 145/78 131/74  Pulse: 88 79  Resp:  (!) 24  Temp:  98.2 F (36.8 C)  SpO2:  97%  Constitutional: tired appearing, not responded to commands CV: normal rate, no edema Respiratory: bilateral chest rise, mild iwob Gastrointestinal: soft, non-tender, no palpable masses  Skin: no visible lesions or rashes Ext: no edema bilat  Psych: alert but nonverbal, not answering questions LUA AVG+bruit/ RIJ TDC  OP HD: MWF at [address]  3.5h  48kg  2.2/5 bath  300/600  Hep none R AVG/ TDC (using AVG)   CXR 11/10- bilat splotchy perihilar disease, edema vs infxn, improved compared to 11/8 film  Assessment/ Plan: 1.  Acute hypoxic respiratory failure: due to combination of pulmonary edema/volume overload with bronchospasm along with influenza +/- possible HCAP.  O2 requirements have overall improved with volume removal and IV abx.  2. Altered mental status: per staff at [place], pt will often be awake and alert at the OP HD unit but will not answer questions or will not respond in general.  3.  ESRD: on HD MWF.  Got dialysis on 11/7, 11/9 and 11/11 here. Next HD Monday to get back on schedule. Had [facility] troubles initially but worked yesterday 11/11 for HD. We were told that the R AVG was ready to be used as well, and have tried that I believe as well.  4.  HTN/ vol: Blood pressure good. Just under the dry wt. Small UF goal next HD.  5.  Secondary hyperparathyroidism: Home binders ordered 6.  Anemia of chronic disease: Hemoglobin 9.  ESA ordered for 06/10/2022.  No iron for now   [name], MD 06/13/2022, 12:33 PM  Recent Labs  Lab 06/08/22 1132  06/09/22 0647 06/09/22 1100 06/10/22 0509 06/11/22 0512 06/12/22 0403  HGB 10.5* 10.4*   < > 9.0* 9.0* 9.3*  ALBUMIN 3.8  --   --   --   --  2.7*  CALCIUM 9.6 9.1  --  8.3* 8.1* 7.8*  PHOS  --  3.6  --  7.7*  --   --   CREATININE 6.56* 3.51*  --  5.47* 3.43* 5.36*  K 6.6* 3.8   < > 4.8 3.7 4.4   < > = values in this interval not displayed.     Inpatient medications:  acetaminophen  1,000 mg Oral Once   aspirin  81 mg Oral Daily   atorvastatin  80 mg Oral Daily   Chlorhexidine Gluconate Cloth  6 each Topical Q0600   clopidogrel  75 mg Oral Daily   darbepoetin (ARANESP) injection - DIALYSIS  60 mcg Subcutaneous Q Thu-1800   diclofenac Sodium  4 g Topical Once   feeding supplement  237 mL Oral BID BM   heparin  5,000 Units Subcutaneous Q8H   hydrALAZINE  50 mg Oral Q8H   labetalol  300 mg Oral BID   multivitamin  1 tablet Oral QHS   mouth rinse  15 mL Mouth Rinse 4 times per day   pantoprazole  40 mg Oral Daily   sevelamer carbonate  1,600 mg Oral TID WC    cefTRIAXone (ROCEPHIN)  IV  2 g (06/13/22 1032)   albuterol, docusate sodium, hydrALAZINE, labetalol, mouth rinse, polyethylene glycol

## 2022-06-13 NOTE — Progress Notes (Signed)
[facility]  PROGRESS NOTE  Cody Macdonald ZJI:[id number] DOB: 1960-12-05 DOA: 06/08/2022 PCP: Merryl Hacker, No   Brief HPI:   61 year old male with history of stroke, tobacco abuse, hypertension ESRD on hemodialysis Monday Wednesday Friday, came to ED from skilled nursing facility with dyspnea, hypertensive urgency, hyperkalemia.  He required 10 L HFNC in the ED to maintain O2 sats more than 90%, chest x-ray showed pulmonary edema.  COVID-19 was negative.  SBP was elevated.  Nephrology was consulted for emergent hemodialysis. Patient was started on Cleviprex and also started on antibiotics vancomycin and cefepime.  Antibiotics were started. Patient transferred to [facility] service [place] assumed care on 06/11/2022    Subjective   Patient seen and examined, no new complaints.   Assessment/Plan:   Acute hypoxemic respiratory failure -Secondary to influenza, pulmonary edema?  HCAP = Chest x-ray was concerning for influenza; treated with Tamiflu -Patient was started on cefepime, changed to ceftriaxone -Volume management per hemodialysis  Hypertensive urgency -Resolved' likely from missing hemodialysis session -Patient started on amlodipine, hydralazine, labetalol -Has been weaned off Cleviprex -Patient was hypotensive so amlodipine and hydralazine were held -Has been started back on hydralazine, labetalol; amlodipine is currently on hold  ESR the on hemodialysis -Patient on hemodialysis MWF -Nephrology following  History of CVA with residual left-sided weakness -Continue aspirin, Plavix -Patient is currently on dysphagia 2 diet -Tolerating diet well  Anemia of chronic disease -Hemoglobin is stable at 9.0 -Continue monitoring H&H       Medications     acetaminophen  1,000 mg Oral Once   aspirin  81 mg Oral Daily   atorvastatin  80 mg Oral Daily   Chlorhexidine Gluconate Cloth  6 each Topical Q0600   clopidogrel  75 mg Oral Daily   darbepoetin (ARANESP) injection - DIALYSIS   60 mcg Subcutaneous Q Thu-1800   diclofenac Sodium  4 g Topical Once   feeding supplement  237 mL Oral BID BM   heparin  5,000 Units Subcutaneous Q8H   hydrALAZINE  50 mg Oral Q8H   labetalol  300 mg Oral BID   multivitamin  1 tablet Oral QHS   mouth rinse  15 mL Mouth Rinse 4 times per day   pantoprazole  40 mg Oral Daily   sevelamer carbonate  1,600 mg Oral TID WC     Data Reviewed:   CBG:  Recent Labs  Lab 06/10/22 0751 06/10/22 1112 06/10/22 1522 06/11/22 2048 06/13/22 0227  GLUCAP 79 87 97 89 86    SpO2: 99 % O2 Flow Rate (L/min): 8 L/min FiO2 (%): 40 %    Vitals:   06/13/22 0530 06/13/22 0600 06/13/22 0645 06/13/22 0907  BP: 113/62 110/61  124/69  Pulse: 80 84  86  Resp: (!) 36 (!) 21  (!) 31  Temp: 99.9 F (37.7 C)     TempSrc: Axillary     SpO2: 92% 95%  99%  Weight:   47.3 kg   Height:          Data Reviewed:  Basic Metabolic Panel: Recent Labs  Lab 06/08/22 1132 06/09/22 0647 06/09/22 1100 06/10/22 0509 06/11/22 0512 06/12/22 0403  NA 138 136 134* 132* 132* 132*  K 6.6* 3.8 4.2 4.8 3.7 4.4  CL 99 97*  --  93* 92* 94*  CO2 20* 24  --  _0 GLUCOSE 57* 86  --  74 84 80  BUN 44* 29*  --  55* 30* 55*  CREATININE 6.56* 3.51*  --  5.47* 3.43* 5.36*  CALCIUM 9.6 9.1  --  8.3* 8.1* 7.8*  MG  --  1.7  --  1.7  --  1.8  PHOS  --  3.6  --  7.7*  --   --     CBC: Recent Labs  Lab 06/08/22 1132 06/09/22 0647 06/09/22 1100 06/10/22 0509 06/11/22 0512 06/12/22 0403  WBC 13.2* 11.6*  --  5.8 4.2 3.9*  NEUTROABS 11.4*  --   --   --   --  3.0  HGB 10.5* 10.4* 11.2* 9.0* 9.0* 9.3*  HCT 36.5* 30.4* 33.0* 28.2* 28.5* 29.1*  MCV 101.1* 87.6  --  90.7 89.9 89.5  PLT 245 217  --  174 187 174    LFT Recent Labs  Lab 06/08/22 1132 06/12/22 0403  AST 46* 312*  ALT 20 97*  ALKPHOS 72 57  BILITOT 0.6 0.3  PROT 8.4* 6.5  ALBUMIN 3.8 2.7*     Antibiotics: Anti-infectives (From admission, onward)    Start     Dose/Rate Route  Frequency Ordered Stop   06/12/22 2000  oseltamivir (TAMIFLU) capsule 30 mg        30 mg Oral  Once 06/12/22 1440 06/12/22 2149   06/10/22 2100  oseltamivir (TAMIFLU) capsule 30 mg        30 mg Oral  Once 06/10/22 0825 06/11/22 0517   06/10/22 1030  cefTRIAXone (ROCEPHIN) 2 g in sodium chloride 0.9 % 100 mL IVPB        2 g 200 mL/hr over 30 Minutes Intravenous Every 24 hours 06/10/22 0945 06/15/22 1029   06/09/22 2200  ceFEPIme (MAXIPIME) 1 g in sodium chloride 0.9 % 100 mL IVPB  Status:  Discontinued        1 g 200 mL/hr over 30 Minutes Intravenous Every 24 hours 06/08/22 1611 06/10/22 0945   06/09/22 1015  oseltamivir (TAMIFLU) capsule 30 mg        30 mg Oral  Once 06/09/22 0920 06/09/22 1045   06/08/22 1830  oseltamivir (TAMIFLU) capsule 30 mg  Status:  Discontinued        30 mg Oral  Once 06/08/22 1818 06/09/22 0920   06/08/22 1612  vancomycin variable dose per unstable renal function (pharmacist dosing)  Status:  Discontinued         Does not apply See admin instructions 06/08/22 1612 06/09/22 0920   06/08/22 1100  vancomycin (VANCOCIN) IVPB 1000 mg/200 mL premix        1,000 mg 200 mL/hr over 60 Minutes Intravenous  Once 06/08/22 1057 06/08/22 1329   06/08/22 1100  ceFEPIme (MAXIPIME) 2 g in sodium chloride 0.9 % 100 mL IVPB        2 g 200 mL/hr over 30 Minutes Intravenous  Once 06/08/22 1057 06/08/22 1225        DVT prophylaxis: Heparin  Code Status: Full code  Family Communication:    CONSULTS nephrology, PCCM   Objective    Physical Examination:   General-appears in no acute distress Heart-S1-S2, regular, no murmur auscultated Lungs-clear to auscultation bilaterally, no wheezing or crackles auscultated Abdomen-soft, nontender, no organomegaly Extremities-no edema in the lower extremities     Status is: Inpatient:             [name]   [facility] If 7PM-7AM, please contact night-coverage at [email or web address], Office   [phone]   06/13/2022, 10:30 AM  LOS: 5 days

## 2022-06-13 NOTE — Progress Notes (Signed)
Respiratory rate fluctuating 20s-40s overnight. O2 sats stable. Placed on bipap by RT for a couple hours, RR continued 30s-40s.  Around 0200 BP decreased to 77/55. No other changes in assessment. Night MD notified of above. BP gradually improved and am hydralazine held per MD.

## 2022-06-13 NOTE — Progress Notes (Signed)
Pt awake alert, makes eye contact with RN and tracks with eyes. Does not speak, answer any questions or nod head. RR 20-30s, shallow but does not appear in any distress.  Received call from daughter-in-law and son; RN placed call on speaker so family could speak to pt and pt did respond verbally to family, speaking a few words at a time. Voice soft but clear and answering questions appropriately.  After phone call ended, pt again remained nonverbal with RN. RN fed pt small bites of dinner and water; pt tolerated well.

## 2022-06-14 DIAGNOSIS — A419 Sepsis, unspecified organism: Secondary | ICD-10-CM | POA: Diagnosis not present

## 2022-06-14 DIAGNOSIS — J111 Influenza due to unidentified influenza virus with other respiratory manifestations: Secondary | ICD-10-CM | POA: Diagnosis not present

## 2022-06-14 DIAGNOSIS — E875 Hyperkalemia: Secondary | ICD-10-CM | POA: Diagnosis not present

## 2022-06-14 DIAGNOSIS — J189 Pneumonia, unspecified organism: Secondary | ICD-10-CM | POA: Diagnosis not present

## 2022-06-14 LAB — CBC WITH DIFFERENTIAL/PLATELET
Abs Immature Granulocytes: 0 10*3/uL (ref 0.00–0.07)
Basophils Absolute: 0.2 10*3/uL — ABNORMAL HIGH (ref 0.0–0.1)
Basophils Relative: 4 %
Eosinophils Absolute: 0 10*3/uL (ref 0.0–0.5)
Eosinophils Relative: 1 %
HCT: 26.6 % — ABNORMAL LOW (ref 39.0–52.0)
Hemoglobin: 8.5 g/dL — ABNORMAL LOW (ref 13.0–17.0)
Lymphocytes Relative: 31 %
Lymphs Abs: 1.2 10*3/uL (ref 0.7–4.0)
MCH: 28.5 pg (ref 26.0–34.0)
MCHC: 32 g/dL (ref 30.0–36.0)
MCV: 89.3 fL (ref 80.0–100.0)
Monocytes Absolute: 0.2 10*3/uL (ref 0.1–1.0)
Monocytes Relative: 4 %
Neutro Abs: 2.3 10*3/uL (ref 1.7–7.7)
Neutrophils Relative %: 60 %
Platelets: 201 10*3/uL (ref 150–400)
RBC: 2.98 MIL/uL — ABNORMAL LOW (ref 4.22–5.81)
RDW: 15 % (ref 11.5–15.5)
WBC: 3.8 10*3/uL — ABNORMAL LOW (ref 4.0–10.5)
nRBC: 0 % (ref 0.0–0.2)
nRBC: 1 /100 WBC — ABNORMAL HIGH

## 2022-06-14 MED ORDER — SODIUM CHLORIDE 0.9 % IV SOLN
2.0000 g | INTRAVENOUS | Status: DC
  Administered 2022-06-16 – 2022-06-18 (×2): 2 g via INTRAVENOUS
  Filled 2022-06-14 (×2): qty 12.5

## 2022-06-14 MED ORDER — VANCOMYCIN HCL IN DEXTROSE 1-5 GM/200ML-% IV SOLN
1000.0000 mg | Freq: Once | INTRAVENOUS | Status: AC
  Administered 2022-06-14: 1000 mg via INTRAVENOUS
  Filled 2022-06-14 (×2): qty 200

## 2022-06-14 MED ORDER — HEPARIN SODIUM (PORCINE) 1000 UNIT/ML IJ SOLN
INTRAMUSCULAR | Status: AC
  Filled 2022-06-14: qty 4

## 2022-06-14 MED ORDER — SODIUM CHLORIDE 0.9 % IV SOLN
2.0000 g | Freq: Once | INTRAVENOUS | Status: AC
  Administered 2022-06-14: 2 g via INTRAVENOUS
  Filled 2022-06-14: qty 12.5

## 2022-06-14 MED ORDER — ACETAMINOPHEN 325 MG PO TABS
650.0000 mg | ORAL_TABLET | Freq: Four times a day (QID) | ORAL | Status: DC | PRN
  Filled 2022-06-14: qty 2

## 2022-06-14 MED ORDER — ACETAMINOPHEN 650 MG RE SUPP
650.0000 mg | Freq: Four times a day (QID) | RECTAL | Status: DC | PRN
  Administered 2022-06-14: 650 mg via RECTAL
  Filled 2022-06-14: qty 1

## 2022-06-14 MED ORDER — VANCOMYCIN HCL 500 MG/100ML IV SOLN
500.0000 mg | INTRAVENOUS | Status: DC
  Administered 2022-06-14 – 2022-06-16 (×2): 500 mg via INTRAVENOUS
  Filled 2022-06-14 (×4): qty 100

## 2022-06-14 NOTE — Progress Notes (Addendum)
Oral care done then attempted to give pt small sips of water. Pt closed lips around straw but did not try to drink any water. RN then offered small amt water on spoon. Pt took water but just held water in mouth and needed prompting to swallow. Pt then kept lips closed and refusing to try any more PO. Pt still nonverbal and not answering any questions or nodding/gesturing.   Unable to give scheduled PO labetalol and hydralazine but vitals stable; bp 122/72 and HR 80s. MD notified.   Later, temp 100.9 ; MD notified. Pt still not taking PO intake; rectal tylenol given.    5992 UPDATE: sats 84% - went to room and nasal cannula off. Placed back on and sats increased >92%. Pt started speaking to RN and said "I was dreaming". Pt denies pain. Able to state name/DOB. Does not answer other orientation questions.

## 2022-06-14 NOTE — Procedures (Signed)
HD Note:  Some information was entered later than the data was gathered due to patient care needs. The stated time with the data is accurate.  Received patient in bed to unit.  Alert and unresponsive to questions or indications made non verbally. Informed consent signed and in chart.   Patient tolerated well. There were no signs of distress.  His blood pressure did not stay above SBP >100 and the UF was off for most of the treatment. Transported back to the room  Alert, without acute distress.  Hand-off given to patient's nurse.   Access used: Right chest HD Catheter Access issues: None  100 ml UF removed during treatment     [facility]

## 2022-06-14 NOTE — Progress Notes (Signed)
[facility] PROGRESS NOTE    Progress Note  Cody Macdonald  MOQ:[id number] DOB: 1961-05-07 DOA: 06/08/2022 PCP: Merryl Hacker, No     Brief Narrative:   Cody Macdonald is an 61 y.o. male past medical history of stroke, essential hypertension end-stage renal disease on hemodialysis Monday Wednesday and Friday sent from skilled nursing facility due to dyspnea hypertensive urgency and hyperkalemia requiring 10 L high flow nasal cannula in the ED chest x-ray showing pulmonary edema SARS-CoV-2 PCR was negative.  Admitted initially to the ICU nephrology was consulted for emergent dialysis, started on Cleviprex and empiric antibiotic's vancomycin and cefepime.  [facility] assumed care on 06/11/2022.    Assessment/Plan:   Acute respiratory failure with hypoxia viral influenza pneumonia: Influenza A was positive, Tmax of 100.8. He is currently on IV Rocephin vancomycin was discontinued on admission. Leukocytosis has resolved. Still requiring 4 L of oxygen to keep saturations greater than 92%. He had a mild leukocytosis on admission and now he is leukopenic check a CBC with differential. Blood cultures remain negative till date. Repeat blood cultures, discontinue Rocephin, start him empirically on vancomycin and cefepime. N.p.o. he is not able to follow commands, all of his pills from this morning are still in his mouth.  Hypertensive urgency: Likely due to noncompliance with his dialysis now blood pressure is improved. Currently  labetalol off Cleviprex. Norvasc and hydralazine were held due to hypotension.  End-stage renal disease on hemodialysis: Monday Wednesday and Friday nephrology following we will continue regular dialysis treatments.  History of CVA: Continue aspirin and Plavix. He is currently on a dysphagia 2 diet.  Anemia of chronic renal disease: Hemoglobin of 9 and has remained relatively stable.  Acute metabolic encephalopathy: Patient not able to swallow, not able to  follow commands. We will place him n.p.o. Speech evaluated the patient recommended dysphagia 2 diet. Change antibiotics to Vanco and cefepime he continues to spike fevers.  Protein-calorie malnutrition, severe Cachectic appearing will need nutrition consult once he is able to take p.o.'s. Consult plan of care  DVT prophylaxis: lovenox Family Communication:none Status is: Inpatient Remains inpatient appropriate because: Acute respiratory failure with hypoxia    Code Status:     Code Status Orders  (From admission, onward)           Start     Ordered   06/08/22 1544  Full code  Continuous        06/08/22 1543           Code Status History     Date Active Date Inactive Code Status Order ID Comments User Context   04/04/2022 1007 04/12/2022 2032 Full Code [id number]  Delene Ruffini, MD ED   12/28/2021 1113 01/04/2022 2102 Full Code [id number]  Lauretta Grill, RN Inpatient         IV Access:   Peripheral IV   Procedures and diagnostic studies:   No results found.   Medical Consultants:   None.   Subjective:    Cody Macdonald nonverbal  Objective:    Vitals:   06/14/22 0343 06/14/22 0400 06/14/22 0410 06/14/22 0754  BP:  133/70  127/71  Pulse:  83 82 84  Resp:  (!) 23 (!) 22 (!) 21  Temp:  99.7 F (37.6 C)  97.6 F (36.4 C)  TempSrc:  Oral  Oral  SpO2:  91% 95% 96%  Weight: 44.8 kg     Height:       SpO2: 96 % O2 Flow Rate (L/min):  4 L/min FiO2 (%): 40 %   Intake/Output Summary (Last 24 hours) at 06/14/2022 0934 Last data filed at 06/14/2022 0806 Gross per 24 hour  Intake 180 ml  Output --  Net 180 ml   Filed Weights   06/12/22 1758 06/13/22 0645 06/14/22 0343  Weight: 48.5 kg 47.3 kg 44.8 kg    Exam: General exam: In no acute distress. Respiratory system: Good air movement and clear to auscultation. Cardiovascular system: S1 & S2 heard, RRR. No JVD. Gastrointestinal system: Abdomen is nondistended, soft and nontender.   Extremities: No pedal edema. Skin: No rashes, lesions or ulcers Psychiatry: Judgment or insight of medical condition.   Data Reviewed:    Labs: Basic Metabolic Panel: Recent Labs  Lab 06/08/22 1132 06/09/22 0647 06/09/22 1100 06/10/22 0509 06/11/22 0512 06/12/22 0403  NA 138 136 134* 132* 132* 132*  K 6.6* 3.8 4.2 4.8 3.7 4.4  CL 99 97*  --  93* 92* 94*  CO2 20* 24  --  22 26 23   GLUCOSE 57* 86  --  74 84 80  BUN 44* 29*  --  55* 30* 55*  CREATININE 6.56* 3.51*  --  5.47* 3.43* 5.36*  CALCIUM 9.6 9.1  --  8.3* 8.1* 7.8*  MG  --  1.7  --  1.7  --  1.8  PHOS  --  3.6  --  7.7*  --   --    GFR Estimated Creatinine Clearance: 9.3 mL/min (A) (by C-G formula based on SCr of 5.36 mg/dL (H)). Liver Function Tests: Recent Labs  Lab 06/08/22 1132 06/12/22 0403  AST 46* 312*  ALT 20 97*  ALKPHOS 72 57  BILITOT 0.6 0.3  PROT 8.4* 6.5  ALBUMIN 3.8 2.7*   No results for input(s): "LIPASE", "AMYLASE" in the last 168 hours. No results for input(s): "AMMONIA" in the last 168 hours. Coagulation profile Recent Labs  Lab 06/08/22 1132  INR 1.3*   COVID-19 Labs  No results for input(s): "DDIMER", "FERRITIN", "LDH", "CRP" in the last 72 hours.  Lab Results  Component Value Date   [place] NEGATIVE 06/08/2022    CBC: Recent Labs  Lab 06/08/22 1132 06/09/22 0647 06/09/22 1100 06/10/22 0509 06/11/22 0512 06/12/22 0403  WBC 13.2* 11.6*  --  5.8 4.2 3.9*  NEUTROABS 11.4*  --   --   --   --  3.0  HGB 10.5* 10.4* 11.2* 9.0* 9.0* 9.3*  HCT 36.5* 30.4* 33.0* 28.2* 28.5* 29.1*  MCV 101.1* 87.6  --  90.7 89.9 89.5  PLT 245 217  --  174 187 174   Cardiac Enzymes: No results for input(s): "CKTOTAL", "CKMB", "CKMBINDEX", "TROPONINI" in the last 168 hours. BNP (last 3 results) No results for input(s): "PROBNP" in the last 8760 hours. CBG: Recent Labs  Lab 06/10/22 0751 06/10/22 1112 06/10/22 1522 06/11/22 2048 06/13/22 0227  GLUCAP 79 87 97 89 86    D-Dimer: No results for input(s): "DDIMER" in the last 72 hours. Hgb A1c: No results for input(s): "HGBA1C" in the last 72 hours. Lipid Profile: No results for input(s): "CHOL", "HDL", "LDLCALC", "TRIG", "CHOLHDL", "LDLDIRECT" in the last 72 hours. Thyroid function studies: No results for input(s): "TSH", "T4TOTAL", "T3FREE", "THYROIDAB" in the last 72 hours.  Invalid input(s): "FREET3" Anemia work up: No results for input(s): "VITAMINB12", "FOLATE", "FERRITIN", "TIBC", "IRON", "RETICCTPCT" in the last 72 hours. Sepsis Labs: Recent Labs  Lab 06/08/22 1422 06/09/22 0645 06/09/22 5361 06/10/22 0509 06/11/22 0512 06/12/22 0026 06/12/22 0403 06/12/22  5726  PROCALCITON  --  >150.00  --  >150.00 >150.00 >150.00  --   --   WBC  --   --  11.6* 5.8 4.2  --  3.9*  --   LATICACIDVEN 2.9*  --   --  1.2  --   --  1.5 1.2   Microbiology Recent Results (from the past 240 hour(s))  Culture, blood (Routine x 2)     Status: None   Collection Time: 06/08/22 11:24 AM   Specimen: BLOOD RIGHT HAND  Result Value Ref Range Status   Specimen Description   Final    BLOOD RIGHT HAND Performed at [facility] [address], [place], [place] [address]    Special Requests   Final    BOTTLES DRAWN AEROBIC AND ANAEROBIC Blood Culture adequate volume Performed at [facility] [address] [place], [place] [address]    Culture   Final    NO GROWTH 5 DAYS Performed at [facility], [place] [address], [place], [place] [address]    Report Status 06/13/2022 FINAL  Final  Culture, blood (Routine x 2)     Status: None   Collection Time: 06/08/22 11:24 AM   Specimen: Right Antecubital; Blood  Result Value Ref Range Status   Specimen Description   Final    RIGHT ANTECUBITAL BLOOD Performed at [facility], [place] [address] [place], [place] [address]    Special Requests   Final    BOTTLES DRAWN AEROBIC AND ANAEROBIC Blood Culture adequate  volume Performed at [facility] [address], [place], [place] [address]    Culture   Final    NO GROWTH 5 DAYS Performed at [facility], [place] [address], [place], [place] [address]    Report Status 06/13/2022 FINAL  Final  Resp Panel by RT-PCR (Flu A&B, Covid) Anterior Nasal Swab     Status: Abnormal   Collection Time: 06/08/22 11:36 AM   Specimen: Anterior Nasal Swab  Result Value Ref Range Status   SARS Coronavirus 2 by RT PCR NEGATIVE NEGATIVE Final    Comment: (NOTE) SARS-CoV-2 target nucleic acids are NOT DETECTED.  The SARS-CoV-2 RNA is generally detectable in upper respiratory specimens during the acute phase of infection. The lowest concentration of SARS-CoV-2 viral copies this assay can detect is 138 copies/mL. A negative result does not preclude SARS-Cov-2 infection and should not be used as the sole basis for treatment or other patient management decisions. A negative result may occur with  improper specimen collection/handling, submission of specimen other than nasopharyngeal swab, presence of viral mutation(s) within the areas targeted by this assay, and inadequate number of viral copies(<138 copies/mL). A negative result must be combined with clinical observations, patient history, and epidemiological information. The expected result is Negative.  Fact Sheet for Patients:  EntrepreneurPulse.com.au  Fact Sheet for Healthcare Providers:  IncredibleEmployment.be  This test is no t yet approved or cleared by the [place] FDA and  has been authorized for detection and/or diagnosis of SARS-CoV-2 by FDA under an Emergency Use Authorization (EUA). This EUA will remain  in effect (meaning this test can be used) for the duration of the COVID-19 declaration under Section 564(b)(1) of the Act, 21 U.S.C.section 360bbb-3(b)(1), unless the authorization is terminated  or revoked sooner.       Influenza A  by PCR POSITIVE (A) NEGATIVE Final   Influenza B by PCR NEGATIVE NEGATIVE Final  Comment: (NOTE) The Xpert Xpress SARS-CoV-2/FLU/RSV plus assay is intended as an aid in the diagnosis of influenza from Nasopharyngeal swab specimens and should not be used as a sole basis for treatment. Nasal washings and aspirates are unacceptable for Xpert Xpress SARS-CoV-2/FLU/RSV testing.  Fact Sheet for Patients: EntrepreneurPulse.com.au  Fact Sheet for Healthcare Providers: IncredibleEmployment.be  This test is not yet approved or cleared by the [place] FDA and has been authorized for detection and/or diagnosis of SARS-CoV-2 by FDA under an Emergency Use Authorization (EUA). This EUA will remain in effect (meaning this test can be used) for the duration of the COVID-19 declaration under Section 564(b)(1) of the Act, 21 U.S.C. section 360bbb-3(b)(1), unless the authorization is terminated or revoked.  Performed at [facility], [place] [address], [place], [place] [address]   MRSA Next Gen by PCR, Nasal     Status: None   Collection Time: 06/08/22  7:01 PM   Specimen: Nasal Mucosa; Nasal Swab  Result Value Ref Range Status   MRSA by PCR Next Gen NOT DETECTED NOT DETECTED Final    Comment: (NOTE) The GeneXpert MRSA Assay (FDA approved for NASAL specimens only), is one component of a comprehensive MRSA colonization surveillance program. It is not intended to diagnose MRSA infection nor to guide or monitor treatment for MRSA infections. Test performance is not FDA approved in patients less than 75 years old. Performed at [facility], [place] [address], [place], [place] [address]      Medications:    acetaminophen  1,000 mg Oral Once   aspirin  81 mg Oral Daily   atorvastatin  80 mg Oral Daily   Chlorhexidine Gluconate Cloth  6 each Topical Q0600   clopidogrel  75 mg Oral Daily   darbepoetin (ARANESP) injection - DIALYSIS   60 mcg Subcutaneous Q Thu-1800   diclofenac Sodium  4 g Topical Once   feeding supplement  237 mL Oral BID BM   heparin  5,000 Units Subcutaneous Q8H   hydrALAZINE  50 mg Oral Q8H   labetalol  300 mg Oral BID   multivitamin  1 tablet Oral QHS   mouth rinse  15 mL Mouth Rinse 4 times per day   pantoprazole  40 mg Oral Daily   sevelamer carbonate  1,600 mg Oral TID WC   Continuous Infusions:  cefTRIAXone (ROCEPHIN)  IV 2 g (06/14/22 0914)      LOS: 6 days   Charlynne Cousins  [facility]  06/14/2022, 9:34 AM

## 2022-06-14 NOTE — Progress Notes (Signed)
Nephrology Follow-Up Consult note   Assessment/Recommendations: Cody Macdonald is a/an 61 y.o. male with a past medical history significant for ESRD, admitted for AHRF and influenza.     Subjective: seen in pt's room. Lying in bed, no c/o's.   Physical Exam: Vitals:   06/12/22 0936 06/12/22 1200  BP: (!) 145/78 131/74  Pulse: 88 79  Resp:  (!) 24  Temp:  98.2 F (36.8 C)  SpO2:  97%  Constitutional: tired appearing, not responded to commands CV: normal rate, no edema Respiratory: bilateral chest rise, mild iwob Gastrointestinal: soft, non-tender, no palpable masses  Skin: no visible lesions or rashes Ext: no edema bilat  Psych: alert but nonverbal, not answering questions LUA AVG+bruit/ RIJ TDC  OP HD: MWF at [facility] [address]  3.5h  48kg  2.2/5 bath  300/600  Hep none R AVG/ TDC (using AVG)   CXR 11/10- bilat splotchy perihilar disease, edema vs infxn, improved compared to 11/8 film  Assessment/ Plan: 1.  Acute hypoxic respiratory failure: due to combination of pulmonary edema/volume overload with bronchospasm along with influenza +/- possible HCAP.  Still on 4 L O2 [place].  2. Altered mental status: per staff at [facility], pt will often be awake and alert at the OP HD unit but will not answer questions or will not respond in general.  3.  ESRD: on HD MWF.  Got dialysis on 11/7, 11/9 and 11/11 here. Next HD Monday to get back on schedule. Had [facility] troubles initially but worked yesterday 11/11 for HD. We were told that the R AVG was ready to be used as well.  4.  HTN/ vol: Blood pressure good. 3kg under dry wt. UF 1-2 L today w/ HD.  5.  Secondary hyperparathyroidism: Home binders ordered 6.  Anemia of chronic disease: Hemoglobin 9.  ESA ordered for 06/10/2022.  No iron for now   [name], MD 06/14/2022, 2:03 PM  Recent Labs  Lab 06/08/22 1132 06/09/22 0647 06/09/22 1100 06/10/22 0509 06/11/22 0512 06/12/22 0403 06/14/22 1026  HGB 10.5* 10.4*   < > 9.0* 9.0*  9.3* 8.5*  ALBUMIN 3.8  --   --   --   --  2.7*  --   CALCIUM 9.6 9.1  --  8.3* 8.1* 7.8*  --   PHOS  --  3.6  --  7.7*  --   --   --   CREATININE 6.56* 3.51*  --  5.47* 3.43* 5.36*  --   K 6.6* 3.8   < > 4.8 3.7 4.4  --    < > = values in this interval not displayed.     Inpatient medications:  acetaminophen  1,000 mg Oral Once   aspirin  81 mg Oral Daily   atorvastatin  80 mg Oral Daily   Chlorhexidine Gluconate Cloth  6 each Topical Q0600   clopidogrel  75 mg Oral Daily   darbepoetin (ARANESP) injection - DIALYSIS  60 mcg Subcutaneous Q Thu-1800   diclofenac Sodium  4 g Topical Once   feeding supplement  237 mL Oral BID BM   heparin  5,000 Units Subcutaneous Q8H   hydrALAZINE  50 mg Oral Q8H   labetalol  300 mg Oral BID   multivitamin  1 tablet Oral QHS   mouth rinse  15 mL Mouth Rinse 4 times per day   pantoprazole  40 mg Oral Daily   sevelamer carbonate  1,600 mg Oral TID WC    ceFEPime (MAXIPIME)  IV     vancomycin     acetaminophen, acetaminophen, albuterol, docusate sodium, hydrALAZINE, labetalol, mouth rinse, polyethylene glycol

## 2022-06-14 NOTE — Progress Notes (Signed)
   06/13/22 0215  Assess: MEWS Score  Temp 99.6 F (37.6 C)  BP (!) 75/55  MAP (mmHg) (!) 63  Pulse Rate 71  ECG Heart Rate 71  Resp (!) 33  SpO2 95 %  O2 Device Bi-PAP  Patient Activity (if Appropriate) In bed  FiO2 (%) 40 %  Assess: MEWS Score  MEWS Temp 0  MEWS Systolic 2  MEWS Pulse 0  MEWS RR 2  MEWS LOC 0  MEWS Score 4  MEWS Score Color Red  Assess: if the MEWS score is Yellow or Red  Were vital signs taken at a resting state? Yes  Focused Assessment No change from prior assessment  Does the patient meet 2 or more of the SIRS criteria? Yes  Does the patient have a confirmed or suspected source of infection? Yes  Provider and Rapid Response Notified? Yes  MEWS guidelines implemented *See Row Information* Yes  Treat  MEWS Interventions Escalated (See documentation below)  Take Vital Signs  Increase Vital Sign Frequency  Red: Q 1hr X 4 then Q 4hr X 4, if remains red, continue Q 4hrs  Escalate  MEWS: Escalate Red: discuss with charge nurse/RN and provider, consider discussing with RRT  Notify: Charge Nurse/RN  Name of Charge Nurse/RN Notified Fred RN  Date Charge Nurse/RN Notified 06/13/22  Notify: Provider  Provider Name/Title Dr. Josephine Cables  Date Provider Notified 06/13/22  Time Provider Notified 0219  Method of Notification Page  Notification Reason Change in status (BP 75/55. had HD earlier today. also gave scheduled hydralazine and labetalol earlier. on bipap for RR 30-40s)  Notify: Rapid Response  Name of Rapid Response RN Notified Mindy RN  Date Rapid Response Notified 06/13/22  Document  Patient Outcome Other (Comment) (BP gradually improved)  Progress note created (see row info) Yes  Assess: SIRS CRITERIA  SIRS Temperature  0  SIRS Pulse 0  SIRS Respirations  1  SIRS WBC 1  SIRS Score Sum  2

## 2022-06-14 NOTE — Progress Notes (Signed)
Pharmacy Antibiotic Note  Cody Macdonald is a 61 y.o. male admitted on 06/08/2022 with bacteremia.  Pharmacy has been consulted for Vancomycin and Cefepime dosing.  Plan: Cefepime 2g IV x1 now , then  Cefepime 2g IV every MWF with dialysis Initiate loading dose of Vancomycin 1000mg  IV x 1, followed by  Vancomycin 500mg  IV every MWF with dialysis    > Goal trough level = 15-20 Monitor daily CBC, temp, SCr, and for clinical signs of improvement  F/u cultures and de-escalate antibiotics as able   Height: 5\' 8"  (172.7 cm) Weight: 44.8 kg (98 lb 12.3 oz) IBW/kg (Calculated) : 68.4  Temp (24hrs), Avg:99 F (37.2 C), Min:97.4 F (36.3 C), Max:100.9 F (38.3 C)  Recent Labs  Lab 06/08/22 1132 06/08/22 1422 06/09/22 0647 06/10/22 0509 06/11/22 0512 06/12/22 0403 06/12/22 0649  WBC 13.2*  --  11.6* 5.8 4.2 3.9*  --   CREATININE 6.56*  --  3.51* 5.47* 3.43* 5.36*  --   LATICACIDVEN 4.3* 2.9*  --  1.2  --  1.5 1.2    Estimated Creatinine Clearance: 9.3 mL/min (A) (by C-G formula based on SCr of 5.36 mg/dL (H)).    No Known Allergies  Antimicrobials this admission: Ceftriaxone 11/7 x1, 11/9 >> 11/12 Cefepime 11/8 x1, 11/13 >>  Vancomycin 11/7 x1, 11/13 >>   Dose adjustments this admission: None  Microbiology results: 11/7 BCx x2: NG (final)  11/7 MRSA PCR: not detected 11/13 BCx x2: pending  Thank you for allowing pharmacy to be a part of this patient's care.  Luisa Hart, PharmD, BCPS Clinical Pharmacist 06/14/2022 10:06 AM   Please refer to AMION for pharmacy phone number

## 2022-06-15 DIAGNOSIS — R400 Somnolence: Secondary | ICD-10-CM

## 2022-06-15 DIAGNOSIS — J189 Pneumonia, unspecified organism: Secondary | ICD-10-CM | POA: Diagnosis not present

## 2022-06-15 DIAGNOSIS — N186 End stage renal disease: Secondary | ICD-10-CM | POA: Diagnosis not present

## 2022-06-15 DIAGNOSIS — Z515 Encounter for palliative care: Secondary | ICD-10-CM | POA: Diagnosis not present

## 2022-06-15 DIAGNOSIS — I160 Hypertensive urgency: Secondary | ICD-10-CM | POA: Diagnosis not present

## 2022-06-15 DIAGNOSIS — J9601 Acute respiratory failure with hypoxia: Secondary | ICD-10-CM | POA: Diagnosis not present

## 2022-06-15 DIAGNOSIS — Z992 Dependence on renal dialysis: Secondary | ICD-10-CM

## 2022-06-15 MED ORDER — CHLORHEXIDINE GLUCONATE CLOTH 2 % EX PADS
6.0000 | MEDICATED_PAD | Freq: Every day | CUTANEOUS | Status: DC
  Administered 2022-06-16 – 2022-06-18 (×2): 6 via TOPICAL

## 2022-06-15 MED ORDER — SODIUM CHLORIDE 0.9 % IV SOLN
2.0000 g | Freq: Once | INTRAVENOUS | Status: AC
  Administered 2022-06-15: 2 g via INTRAVENOUS
  Filled 2022-06-15: qty 12.5

## 2022-06-15 NOTE — Progress Notes (Signed)
Pt was having hard time swallowing apple sauce with crushed meds in the evening.  Pt was holding the apple sauce inside of his mouth without swallowing.  Pt didn't have enough strength to suck the water out of straw even if he was able to drink a little bit of Ensure during lunch time.

## 2022-06-15 NOTE — Progress Notes (Signed)
Nephrology Follow-Up Consult note   Assessment/Recommendations: Cody Macdonald is a/an 61 y.o. male with a past medical history significant for ESRD, admitted for AHRF and influenza.     Subjective: seen in pt's room. Lying in bed, no c/o's.   Physical Exam: Vitals:   06/12/22 0936 06/12/22 1200  BP: (!) 145/78 131/74  Pulse: 88 79  Resp:  (!) 24  Temp:  98.2 F (36.8 C)  SpO2:  97%  Constitutional: tired appearing, responding w/ groans and nods CV: normal rate, no edema Respiratory: bilateral chest rise, mild iwob Gastrointestinal: soft, non-tender, no palpable masses  Skin: no visible lesions or rashes Ext: no edema bilat  Psych: alert  LUA AVG+bruit/ RIJ TDC  OP HD: MWF at [facility] [address]  3.5h  48kg  2.2/5 bath  300/600  Hep none R AVG/ TDC (using AVG)   CXR 11/10- bilat splotchy perihilar disease, edema vs infxn, improved compared to 11/8 film  Assessment/ Plan: 1.  Acute hypoxic respiratory failure: due to combination of pulmonary edema/volume overload with bronchospasm along with influenza +/- possible HCAP.  Still on 4 L O2 [place].  2. Altered mental status: per staff at [facility], pt will often be awake and alert at the OP HD unit but will not answer questions or will not respond in general.  3.  ESRD: on HD MWF.  HD tomorrow.  4. HD access: had TDC troubles initially but worked 11/11 for HD. We were told that the R AVG was ready to be used as well.  5.  HTN/ vol: Blood pressure good. 2kg under dry wt. BP's not tolerating further UF. Min UF next HD.  6.  Secondary hyperparathyroidism: Home binders ordered 7.  Anemia of chronic disease: Hemoglobin 9.  ESA ordered for 06/10/2022.  No iron for now   [name], MD 06/15/2022, 4:23 PM  Recent Labs  Lab 06/09/22 0647 06/09/22 1100 06/10/22 0509 06/11/22 0512 06/12/22 0403 06/14/22 1026  HGB 10.4*   < > 9.0* 9.0* 9.3* 8.5*  ALBUMIN  --   --   --   --  2.7*  --   CALCIUM 9.1  --  8.3* 8.1* 7.8*  --   PHOS  3.6  --  7.7*  --   --   --   CREATININE 3.51*  --  5.47* 3.43* 5.36*  --   K 3.8   < > 4.8 3.7 4.4  --    < > = values in this interval not displayed.     Inpatient medications:  aspirin  81 mg Oral Daily   Chlorhexidine Gluconate Cloth  6 each Topical Q0600   clopidogrel  75 mg Oral Daily   darbepoetin (ARANESP) injection - DIALYSIS  60 mcg Subcutaneous Q Thu-1800   diclofenac Sodium  4 g Topical Once   feeding supplement  237 mL Oral BID BM   heparin  5,000 Units Subcutaneous Q8H   hydrALAZINE  50 mg Oral Q8H   labetalol  300 mg Oral BID   multivitamin  1 tablet Oral QHS   mouth rinse  15 mL Mouth Rinse 4 times per day   pantoprazole  40 mg Oral Daily   sevelamer carbonate  1,600 mg Oral TID WC    ceFEPime (MAXIPIME) IV     vancomycin Stopped (06/14/22 2021)   acetaminophen, acetaminophen, albuterol, docusate sodium, hydrALAZINE, labetalol, mouth rinse, polyethylene glycol

## 2022-06-15 NOTE — Progress Notes (Signed)
[facility]                                                                               Cody Macdonald, is a 61 y.o. male, DOB - 1961-01-15, CHE:[id number] Admit date - 06/08/2022    Outpatient Primary MD for the patient is Pcp, No  LOS - 7  days    Brief summary   Cody Macdonald is an 61 y.o. male past medical history of stroke, essential hypertension end-stage renal disease on hemodialysis Monday Wednesday and Friday sent from skilled nursing facility due to dyspnea hypertensive urgency and hyperkalemia requiring 10 L high flow nasal cannula in the ED chest x-ray showing pulmonary edema SARS-CoV-2 PCR was negative.  Admitted initially to the ICU nephrology was consulted for emergent dialysis, started on Cleviprex and empiric antibiotic's vancomycin and cefepime.  [facility] assumed care on 06/11/2022.    Assessment & Plan    Assessment and Plan:    Acute respiratory failure with h ypoxia sec to influenza A infection.  [place] oxygen to keep sats greater than 90%.  Plan to wean his oxygen down in the next 24 hours.  Empirically on broad spectrum IV antibiotics.  Check CXR in am.     Hypertensive urgency.  BP parameters have improved.     ESRD on HD.  Further management as per nephrology.    H/o CVA On aspirin and plavix , on dysphagia  1 diet.    Anemia of chronic disease:  Stable hemoglobin around 9.      Acute metabolic encephalopathy: Probably sec to influenza A infection, improving mental status today. More alert and saying yes and no to questions.  Speech evaluated the patient recommended dysphagia 1 diet. Change antibiotics to Vanco and cefepime he continues to spike fevers.   Protein-calorie malnutrition, severe Cachectic appearing will need nutrition consult once he is able to take p.o.'s. Consult plan of care  RN Pressure Injury Documentation:    Malnutrition Type:  Nutrition Problem: Severe Malnutrition Etiology: chronic illness  (ESRD on HD)   Malnutrition Characteristics:  Signs/Symptoms: severe muscle depletion, severe fat depletion   Nutrition Interventions:  Interventions: Refer to RD note for recommendations  Estimated body mass index is 15.49 kg/m as calculated from the following:   Height as of this encounter: _0  (1.727 m).   Weight as of this encounter: 46.2 kg.  Code Status:full code.  DVT Prophylaxis:  heparin injection 5,000 Units Start: 06/08/22 2200 SCDs Start: 06/08/22 1543   Level of Care: Level of care: Progressive Family Communication: none at bedside   Disposition Plan:     Remains inpatient appropriate:  not medically stable.   Procedures:  None.   Consultants:   Palliative care.   Antimicrobials:   Anti-infectives (From admission, onward)    Start     Dose/Rate Route Frequency Ordered Stop   06/15/22 0900  ceFEPIme (MAXIPIME) 2 g in sodium chloride 0.9 % 100 mL IVPB        2 g 200 mL/hr over 30 Minutes Intravenous  Once 06/15/22 0804 06/15/22 1044   06/14/22 1200  ceFEPIme (MAXIPIME) 2 g in sodium chloride 0.9 % 100 mL IVPB  2 g 200 mL/hr over 30 Minutes Intravenous Every M-W-F (Hemodialysis) 06/14/22 1008     06/14/22 1200  vancomycin (VANCOREADY) IVPB 500 mg/100 mL        500 mg 100 mL/hr over 60 Minutes Intravenous Every M-W-F (Hemodialysis) 06/14/22 1008     06/14/22 1100  vancomycin (VANCOCIN) IVPB 1000 mg/200 mL premix        1,000 mg 200 mL/hr over 60 Minutes Intravenous  Once 06/14/22 1000 06/14/22 1600   06/14/22 1100  ceFEPIme (MAXIPIME) 2 g in sodium chloride 0.9 % 100 mL IVPB        2 g 200 mL/hr over 30 Minutes Intravenous  Once 06/14/22 1000 06/14/22 1259   06/12/22 2000  oseltamivir (TAMIFLU) capsule 30 mg        30 mg Oral  Once 06/12/22 1440 06/12/22 2149   06/10/22 2100  oseltamivir (TAMIFLU) capsule 30 mg        30 mg Oral  Once 06/10/22 0825 06/11/22 0517   06/10/22 1030  cefTRIAXone (ROCEPHIN) 2 g in sodium chloride 0.9 % 100 mL IVPB   Status:  Discontinued        2 g 200 mL/hr over 30 Minutes Intravenous Every 24 hours 06/10/22 0945 06/14/22 1000   06/09/22 2200  ceFEPIme (MAXIPIME) 1 g in sodium chloride 0.9 % 100 mL IVPB  Status:  Discontinued        1 g 200 mL/hr over 30 Minutes Intravenous Every 24 hours 06/08/22 1611 06/10/22 0945   06/09/22 1015  oseltamivir (TAMIFLU) capsule 30 mg        30 mg Oral  Once 06/09/22 0920 06/09/22 1045   06/08/22 1830  oseltamivir (TAMIFLU) capsule 30 mg  Status:  Discontinued        30 mg Oral  Once 06/08/22 1818 06/09/22 0920   06/08/22 1612  vancomycin variable dose per unstable renal function (pharmacist dosing)  Status:  Discontinued         Does not apply See admin instructions 06/08/22 1612 06/09/22 0920   06/08/22 1100  vancomycin (VANCOCIN) IVPB 1000 mg/200 mL premix        1,000 mg 200 mL/hr over 60 Minutes Intravenous  Once 06/08/22 1057 06/08/22 1329   06/08/22 1100  ceFEPIme (MAXIPIME) 2 g in sodium chloride 0.9 % 100 mL IVPB        2 g 200 mL/hr over 30 Minutes Intravenous  Once 06/08/22 1057 06/08/22 1225        Medications  Scheduled Meds:  aspirin  81 mg Oral Daily   Chlorhexidine Gluconate Cloth  6 each Topical Q0600   clopidogrel  75 mg Oral Daily   darbepoetin (ARANESP) injection - DIALYSIS  60 mcg Subcutaneous Q Thu-1800   diclofenac Sodium  4 g Topical Once   feeding supplement  237 mL Oral BID BM   heparin  5,000 Units Subcutaneous Q8H   hydrALAZINE  50 mg Oral Q8H   labetalol  300 mg Oral BID   multivitamin  1 tablet Oral QHS   mouth rinse  15 mL Mouth Rinse 4 times per day   pantoprazole  40 mg Oral Daily   sevelamer carbonate  1,600 mg Oral TID WC   Continuous Infusions:  ceFEPime (MAXIPIME) IV     vancomycin Stopped (06/14/22 2021)   PRN Meds:.acetaminophen, acetaminophen, albuterol, docusate sodium, hydrALAZINE, labetalol, mouth rinse, polyethylene glycol    Subjective:   Cody Macdonald was seen and examined today.  No events  overnight.  Objective:   Vitals:   06/15/22 0503 06/15/22 0519 06/15/22 0804 06/15/22 1128  BP: 125/72  127/62 132/75  Pulse:   84 89  Resp: (!) 27 20  (!) 23  Temp: 97.9 F (36.6 C)  97.8 F (36.6 C)   TempSrc: Axillary  Oral   SpO2:   99% 95%  Weight:      Height:        Intake/Output Summary (Last 24 hours) at 06/15/2022 1605 Last data filed at 06/15/2022 0400 Gross per 24 hour  Intake 290.65 ml  Output 100 ml  Net 190.65 ml   Filed Weights   06/14/22 0343 06/14/22 1541 06/15/22 0455  Weight: 44.8 kg 46.2 kg 46.2 kg     Exam General: Alert and comfortable.  Cardiovascular: S1 S2 auscultated, no murmurs, RRR Respiratory: Clear to auscultation bilaterally, no wheezing, rales or rhonchi Gastrointestinal: Soft, nontender, nondistended, + bowel sounds Ext: no pedal edema bilaterally Neuro: alert and oriented to person only.  Skin: No rashes Psych: mood is approprite.    Data Reviewed:  I have personally reviewed following labs and imaging studies   CBC Lab Results  Component Value Date   WBC 3.8 (L) 06/14/2022   RBC 2.98 (L) 06/14/2022   HGB 8.5 (L) 06/14/2022   HCT 26.6 (L) 06/14/2022   MCV 89.3 06/14/2022   MCH 28.5 06/14/2022   PLT 201 06/14/2022   MCHC 32.0 06/14/2022   RDW 15.0 06/14/2022   LYMPHSABS 1.2 06/14/2022   MONOABS 0.2 06/14/2022   EOSABS 0.0 06/14/2022   BASOSABS 0.2 (H) 64/38/3818     Last metabolic panel Lab Results  Component Value Date   NA 132 (L) 06/12/2022   K 4.4 06/12/2022   CL 94 (L) 06/12/2022   CO2 23 06/12/2022   BUN 55 (H) 06/12/2022   CREATININE 5.36 (H) 06/12/2022   GLUCOSE 80 06/12/2022   GFRNONAA 11 (L) 06/12/2022   GFRAA  03/28/2007    >60        The eGFR has been calculated using the MDRD equation. This calculation has not been validated in all clinical   CALCIUM 7.8 (L) 06/12/2022   PHOS 7.7 (H) 06/10/2022   PROT 6.5 06/12/2022   ALBUMIN 2.7 (L) 06/12/2022   BILITOT 0.3 06/12/2022   ALKPHOS 57  06/12/2022   AST 312 (H) 06/12/2022   ALT 97 (H) 06/12/2022   ANIONGAP 15 06/12/2022    CBG (last 3)  Recent Labs    06/13/22 0227  GLUCAP 86      Coagulation Profile: No results for input(s): "INR", "PROTIME" in the last 168 hours.   Radiology Studies: No results found.     Hosie Poisson M.D. [facility] 06/15/2022, 4:05 PM  Available via Epic secure chat 7am-7pm After 7 pm, please refer to night coverage provider listed on amion.

## 2022-06-15 NOTE — Consult Note (Signed)
Consultation Note Date: 06/15/2022   Patient Name: CECILIO Macdonald  DOB: 1960/08/26  MRN: [id number]  Age / Sex: 61 y.o., male  PCP: Pcp, No Referring Physician: Hosie Poisson, MD  Reason for Consultation:  "end of life"  HPI/Patient Profile: 62 y.o. 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 Workup revealed sepsis possibly related pnuemonia, influenza A.  His mental status has waxed and waned during admission and he is with very poor po intake. Palliative medicine consulted for "end of life".   Primary Decision Maker HCPOA - son Cody Macdonald  Discussion: Chart reviewed including labs, progress notes, imaging from this and previous encounters. He is a LTC resident at [facility].  On eval patient was awake, tracking. He was able to tell me his name after prolonged processing. He is unable to participate in goals of care discussion. He denies pain. He is not conversive. I was able to feed him one bite off his lunch tray, he had prolonged mastication, did not feed more due to RN needing to provide him medications. He is cachectic with diffuse muscle wasting. Noted his weight on 11/2 documented as 53.5kg and today he is 46.2kg- difference is 7.2 kg which is greater than 10% loss in two weeks. Last albumin on 11/11 is 2.7. I called his son and received no answer- called other contact- Cody Macdonald who is his daughter in Sports coach.  Introduced Palliative medicine and reason for consult.  Palliative medicine is specialized medical care for people living with serious illness. It focuses on providing relief from the symptoms and stress of a serious illness. The goal is to improve quality of life for both the patient and the family.  Discussed consulted due to chronic illness and possible  end of life. Also discussed needing to discuss future medical decisions that may arise and define patient's values and goals and help make decisions within the context of patient's values and goals.  Cody Macdonald shares she has been very concerned about his state of health. She tried to contact patient's son during our conversation, but he was not reachable. We made plan to meet in person tomorrow.   SUMMARY OF RECOMMENDATIONS   -Patient very debilitated at baseline, now with waxing and waning of mental status, poor po intake- he would be eligible for hospice if this aligns with goals of care and HD is stopped -He is currently full code, but would not benefit greatly from CPR if he experienced cardiac arrest- will discuss with family tomorrow and recommend DNR -Plan continue current measures for now -To meet with family tomorrow morning at 12 for further discussion regarding goals of care  Code Status/Advance Care Planning: Full code   Prognosis:   Unable to determine  Discharge Planning: To Be Determined  Primary Diagnoses: Present on Admission:  HCAP (healthcare-associated pneumonia)  ESRD (end stage renal disease) ([place])   Review of Systems  Unable  to perform ROS: Mental status change    Physical Exam Vitals and nursing note reviewed.  Constitutional:      Appearance: He is ill-appearing.     Comments: Frail, cachectic  Musculoskeletal:     Comments: Diffuse muscle wasting  Skin:    Coloration: Skin is pale.  Neurological:     Comments: Lethargic, minimally verbal     Vital Signs: BP 132/75 (BP Location: Right Arm)   Pulse 89   Temp 97.8 F (36.6 C) (Oral)   Resp (!) 23   Ht 5\' 8"  (1.727 m)   Wt 46.2 kg   SpO2 95%   BMI 15.49 kg/m  Pain Scale: PAINAD POSS *See Group Information*: S-Acceptable,Sleep, easy to arouse Pain Score: 0-No pain   SpO2: SpO2: 95 % O2 Device:SpO2: 95 % O2 Flow Rate: .O2 Flow Rate (L/min): 4 L/min  IO: Intake/output summary:   Intake/Output Summary (Last 24 hours) at 06/15/2022 1359 Last data filed at 06/15/2022 0400 Gross per 24 hour  Intake 290.65 ml  Output 100 ml  Net 190.65 ml    LBM: Last BM Date : 06/12/22 Baseline Weight: Weight: 48.1 kg Most recent weight: Weight: 46.2 kg       Thank you for this consult. Palliative medicine will continue to follow and assist as needed.   Greater than 50%  of this time was spent counseling and coordinating care related to the above assessment and plan.  Signed by: Mariana Kaufman, AGNP-C Palliative Medicine    Please contact Palliative Medicine Team phone at [phone] for questions and concerns.  For individual provider: See Shea Evans

## 2022-06-15 NOTE — Progress Notes (Signed)
Nutrition Follow-up  DOCUMENTATION CODES:   Underweight, Severe malnutrition in context of chronic illness  INTERVENTION:  Continue dysphagia 1 diet per SLP Ensure Enlive po BID, each supplement provides 350 kcal and 20 grams of protein. Magic cup TID with meals, each supplement provides 290 kcal and 9 grams of protein Rena-vit daily Recommend Cortrak placement as aligns with GOC  NUTRITION DIAGNOSIS:   Severe Malnutrition related to chronic illness (ESRD on HD) as evidenced by severe muscle depletion, severe fat depletion.  Ongoing  GOAL:   Patient will meet greater than or equal to 90% of their needs  Goal unmet  MONITOR:   Diet advancement, Weight trends, Supplement acceptance, Labs  REASON FOR ASSESSMENT:   Rounds    ASSESSMENT:   Pt with hx of ESRD on HD, hx CVA, HTN, and hx of EtOH and tobacco abuse presented to ED from SNF with SOB, hyperkalemia, and in HTN crisis.  Pt's diet was downgraded to NPO d/t lethargy and inability to safely swallow medications.   PMT met with patient today regarding [place]. Unable to participate. Plans for family meeting tomorrow. Discussed possibility of Cortrak placement with MD as it is unlikely pt will be able to meet nutritional needs without supplemental nutrition. MD agrees however need to establish [place] first.   ST assessed pt today. Recommend dysphagia 1, thin liquids today.   Medications: rena-vit, protonix, renvela, IV abx MWF  HD yesterday (11/13). Net UF 173m.  Post HD weight: 46.2 kg I/O's: -3.5L since admit  Diet Order:   Diet Order             DIET - DYS 1 Room service appropriate? Yes; Fluid consistency: Thin  Diet effective now                   EDUCATION NEEDS:   Not appropriate for education at this time  Skin:  Skin Assessment: Reviewed RN Assessment  Last BM:  11/11  Height:   Ht Readings from Last 1 Encounters:  06/09/22 _0  (1.727 m)    Weight:   Wt Readings from Last 1 Encounters:   06/15/22 46.2 kg    Ideal Body Weight:  70 kg  BMI:  Body mass index is 15.49 kg/m.  Estimated Nutritional Needs:   Kcal:  1700-1900 kcal/d  Protein:  80-100 g/d  Fluid:  1L+UOP  AClayborne Dana RDN, LDN Clinical Nutrition

## 2022-06-15 NOTE — Progress Notes (Signed)
Speech Language Pathology Treatment: Dysphagia  Patient Details Name: Cody Macdonald MRN: [id number] DOB: [birth year] Today's Date: 06/15/2022 Time: 7824-2353 SLP Time Calculation (min) (ACUTE ONLY): 30 min  Assessment / Plan / Recommendation Clinical Impression  Pt seen at bedside for skilled ST intervention targeting goals for swallow safety. Pt has been NPO due to decline in mentation, alertness, and appropriateness for PO intake. Today, pt was awake and cooperative. Oral cavity was noted to be very dry. Oral care was completed with suction, with removal of a moderate amount of thick secretions from the oral cavity. Following oral care, pt accepted trials of ice chips, thin liquid, and puree textures. Pt consumed multiple large consecutive boluses of thin liquid via straw, and did not exhibit a cough reflex or change in voice quality. Pt tolerated trials of puree without pocketing or holding.  Pt was able to answer simple yes/no questions, follow most directions, and even verbalize his first name. It is anticipated that his ability/willingness/safety to take PO will be highly variable for the immediate future. Will begin a conservative diet of puree and thin liquids. Crushed meds and full assist with all PO intake is recommended at this time. Safe swallow precautions posted at [facility]. RN, RD, and MD informed. SLP will continue to follow to assess diet tolerance and determine readiness to advance textures.   HPI HPI: Pt is a 61 y.o. male who presented to [facility] ED 11/7 with dyspnea, hypertensive urgency, hyperkalemia and was transferred to [facility] for dialysis. CXR 11/8: Progressed bilateral multilobar airspace opacity now with early  consolidation suspected. Differential considerations include  asymmetric pulmonary edema, bilateral pneumonia, developing ARDS. Pt found to have influenza A. PMH: ESRD on HD MWF, CKD, CVA, HTN, Etoh abuse tobacco abuse, anemia.      SLP Plan  Continue with current plan of  care      Recommendations for follow up therapy are one component of a multi-disciplinary discharge planning process, led by the attending physician.  Recommendations may be updated based on patient status, additional functional criteria and insurance authorization.    Recommendations  Diet recommendations: Thin liquid;Dysphagia 1 (puree) Liquids provided via: Straw Medication Administration: Crushed with puree Supervision: Trained caregiver to feed patient;Full supervision/cueing for compensatory strategies Compensations: Slow rate;Small sips/bites Postural Changes and/or Swallow Maneuvers: Seated upright 90 degrees;Upright 30-60 min after meal                Oral Care Recommendations: Oral care QID;Staff/trained caregiver to provide oral care Follow Up Recommendations:  (TBD) Assistance recommended at discharge: Frequent or constant Supervision/Assistance SLP Visit Diagnosis: Dysphagia, unspecified (R13.10) Plan: Continue with current plan of care          Cody Macdonald Cody Macdonald, [facility] [place] Speech Language Pathologist Office: [phone]  Shonna Chock 06/15/2022, 11:54 AM

## 2022-06-15 NOTE — Progress Notes (Signed)
Pt has PRN Bipap machine. Pt resting comfortably on 4L HFNC. Bipap on standby in pt's room if needed.

## 2022-06-16 ENCOUNTER — Inpatient Hospital Stay (HOSPITAL_COMMUNITY): Payer: Medicaid Other

## 2022-06-16 DIAGNOSIS — J9601 Acute respiratory failure with hypoxia: Secondary | ICD-10-CM | POA: Diagnosis not present

## 2022-06-16 DIAGNOSIS — Z7189 Other specified counseling: Secondary | ICD-10-CM

## 2022-06-16 DIAGNOSIS — R627 Adult failure to thrive: Secondary | ICD-10-CM | POA: Diagnosis not present

## 2022-06-16 DIAGNOSIS — E43 Unspecified severe protein-calorie malnutrition: Secondary | ICD-10-CM

## 2022-06-16 DIAGNOSIS — J101 Influenza due to other identified influenza virus with other respiratory manifestations: Secondary | ICD-10-CM

## 2022-06-16 DIAGNOSIS — A419 Sepsis, unspecified organism: Secondary | ICD-10-CM | POA: Diagnosis not present

## 2022-06-16 DIAGNOSIS — N186 End stage renal disease: Secondary | ICD-10-CM | POA: Diagnosis not present

## 2022-06-16 DIAGNOSIS — J189 Pneumonia, unspecified organism: Secondary | ICD-10-CM | POA: Diagnosis not present

## 2022-06-16 DIAGNOSIS — I160 Hypertensive urgency: Secondary | ICD-10-CM | POA: Diagnosis not present

## 2022-06-16 LAB — CBC WITH DIFFERENTIAL/PLATELET
Abs Immature Granulocytes: 0.02 10*3/uL (ref 0.00–0.07)
Basophils Absolute: 0 10*3/uL (ref 0.0–0.1)
Basophils Relative: 0 %
Eosinophils Absolute: 0.1 10*3/uL (ref 0.0–0.5)
Eosinophils Relative: 2 %
HCT: 29 % — ABNORMAL LOW (ref 39.0–52.0)
Hemoglobin: 9.4 g/dL — ABNORMAL LOW (ref 13.0–17.0)
Immature Granulocytes: 0 %
Lymphocytes Relative: 25 %
Lymphs Abs: 1.2 10*3/uL (ref 0.7–4.0)
MCH: 28.8 pg (ref 26.0–34.0)
MCHC: 32.4 g/dL (ref 30.0–36.0)
MCV: 89 fL (ref 80.0–100.0)
Monocytes Absolute: 0.4 10*3/uL (ref 0.1–1.0)
Monocytes Relative: 8 %
Neutro Abs: 3.1 10*3/uL (ref 1.7–7.7)
Neutrophils Relative %: 65 %
Platelets: 236 10*3/uL (ref 150–400)
RBC: 3.26 MIL/uL — ABNORMAL LOW (ref 4.22–5.81)
RDW: 15 % (ref 11.5–15.5)
WBC: 4.9 10*3/uL (ref 4.0–10.5)
nRBC: 0 % (ref 0.0–0.2)

## 2022-06-16 LAB — BASIC METABOLIC PANEL
Anion gap: 16 — ABNORMAL HIGH (ref 5–15)
BUN: 39 mg/dL — ABNORMAL HIGH (ref 6–20)
CO2: 25 mmol/L (ref 22–32)
Calcium: 8.6 mg/dL — ABNORMAL LOW (ref 8.9–10.3)
Chloride: 95 mmol/L — ABNORMAL LOW (ref 98–111)
Creatinine, Ser: 5.22 mg/dL — ABNORMAL HIGH (ref 0.61–1.24)
GFR, Estimated: 12 mL/min — ABNORMAL LOW (ref >60)
Glucose, Bld: 87 mg/dL (ref 70–99)
Potassium: 3.9 mmol/L (ref 3.5–5.1)
Sodium: 136 mmol/L (ref 135–145)

## 2022-06-16 MED ORDER — HEPARIN SODIUM (PORCINE) 1000 UNIT/ML IJ SOLN
INTRAMUSCULAR | Status: AC
  Administered 2022-06-16: 1600 [IU]
  Filled 2022-06-16: qty 4

## 2022-06-16 NOTE — Progress Notes (Signed)
Received patient in bed to unit.  Alert. Informed consent signed and in chart.     Patient tolerated well.   06/16/22 1208  Vitals  Pulse Rate 86  Resp (!) 42  BP (!) 94/57  SpO2 99 %  O2 Device Nasal Cannula  Oxygen Therapy  FiO2 (%) (!) 4 %    Transported back to the room  Alert, without acute distress.  Hand-off given to patient's nurse.   Access used: [facility] Access issues: none  Total UF removed: 1.2 L Medication(s) given: Vancomycin    [facility]

## 2022-06-16 NOTE — Progress Notes (Signed)
Daily Progress Note   Patient Name: Cody Macdonald       Date: 06/16/2022 DOB: May 29, 1961  Age: 61 y.o. MRN#: [id number] Attending Physician: Kerney Elbe, DO Primary Care Physician: Pcp, No Admit Date: 06/08/2022  Reason for Consultation/Follow-up: Establishing goals of care  Patient Profile/HPI:  61 y.o. 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 Workup revealed sepsis possibly related pnuemonia, influenza A.  His mental status has waxed and waned during admission and he is with very poor po intake. Palliative medicine consulted for "end of life".   Subjective: Chart reviewed including labs, progress notes, imaging from this and previous encounters.   I have reviewed medical records including Care Everywhere, progress notes from this and prior admissions, labs and imaging, discussed with RN.  On evaluation patient is awake.  He was [place] briefly.  He otherwise does not interact.  He does not answer any questions.  Per nursing report he has not eaten at all today and is unable to take his oral medications.  I met with patient's son Jefm Miles and daughter-in-law Patience.   I introduced Palliative Medicine as specialized medical care for people living with serious illness. It focuses on providing relief from the symptoms and stress of a serious illness. The goal is to improve quality of life for both the patient and the family.  We discussed a brief life review of the patient.  He has been on disability for quite some time.  His son is unsure for what reason.  He had his second stroke in April.  There has been a lot of decline since the second stroke.  His  son notes that in his best days he was funny and vibrant.  [name] is now mostly bedbound.  He does not interact very much.  I reviewed with [name] and Patience Makar's poor nutritional state.  I reviewed with them his significant weight loss.  As well as his very evident debility and deconditioning.  We discussed that he is not taking in any p.o. foods or medications.  Noted that last night he was unable to even pour water from a straw.  I shared my concern that whenever patients have major changes in  their nutrition, cognition, and functional state this can be indicators that someone is heading towards end-of-life.  I attempted to elicit values and goals of care important to the patient.    The difference between aggressive medical intervention and comfort care was considered in light of the patient's goals of care.   Advance directives, concepts specific to code status, artificial feeding and hydration, and rehospitalization were considered and discussed.  [name] asked [name] last night if he wanted a feeding tube. [name] shook his head "no". However, Jefm Miles said he would choose a feeding tube for his Dad. I attempted to also ask [name] about a feeding tube and did not get a response. I discussed with Jefm Miles the benefits and risks of a feeding tube.  We discussed that feeding tubes are not benign, they come with their own side effects including severe diarrhea.  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  We also discussed that he would continue to be at risk of aspiration and pneumonia due to his debility deconditioning and likely aspirating his own secretions.  Discussed with patient/family the importance of continued conversation with family and the medical providers regarding overall plan of care and treatment options, ensuring decisions are within the context of the patient's values and GOCs.    I discussed the possibility of a  transition to full comfort measures only.  Noting that this would include stopping dialysis, no feeding tube, and providing medications for symptoms as patient proceeded through natural dying process.  Encouraged patient/family to consider DNR/DNI status understanding evidenced based poor outcomes in similar hospitalized patients, as the cause of the arrest is likely associated with chronic/terminal disease rather than a reversible acute cardio-pulmonary event.   Hospice services outpatient were explained and offered. Hard choices book was provided.  Questions and concerns were addressed. The family was encouraged to call with questions or concerns.   Review of Systems  Unable to perform ROS: Mental status change     Physical Exam Vitals and nursing note reviewed.  Constitutional:      Appearance: He is ill-appearing.     Comments: Frail, cachectic  Pulmonary:     Comments: Increased rate Neurological:     Comments: Lethargic nonverbal             Vital Signs: BP (!) 86/55   Pulse 90   Temp 97.8 F (36.6 C)   Resp (!) 39   Ht _0  (1.727 m)   Wt 45.2 kg   SpO2 100%   BMI 15.15 kg/m  SpO2: SpO2: 100 % O2 Device: O2 Device: Nasal Cannula O2 Flow Rate: O2 Flow Rate (L/min): 4 L/min  Intake/output summary:  Intake/Output Summary (Last 24 hours) at 06/16/2022 1103 Last data filed at 06/15/2022 2300 Gross per 24 hour  Intake 0 ml  Output --  Net 0 ml   LBM: Last BM Date : 06/12/22 Baseline Weight: Weight: 48.1 kg Most recent weight: Weight: 45.2 kg       Palliative Assessment/Data: PPS: 20%      Patient Active Problem List   Diagnosis Date Noted   Protein-calorie malnutrition, severe 06/12/2022   Influenza A 06/09/2022   Acute respiratory failure with hypoxia ([place]) 06/09/2022   HCAP (healthcare-associated pneumonia) 06/08/2022   Hypertensive urgency 06/08/2022   Hypertension 04/05/2022   Hyperkalemia 04/05/2022   Pulmonary edema 04/05/2022   Volume  overload 04/04/2022   Malnutrition of moderate degree 12/26/2021  ESRD (end stage renal disease) ([place]) 12/24/2021   Symptomatic anemia 12/24/2021   History of stroke 12/24/2021   Tobacco abuse 12/24/2021   Alcohol abuse 12/24/2021   Hypoalbuminemia 12/24/2021   AKI (acute kidney injury) ([place]) 12/24/2021   Abdominal distention 12/24/2021   Leg edema 12/24/2021    Palliative Care Assessment & Plan    Assessment/Recommendations/Plan  Jefm Miles wishes to speak with his mother about decisions that need to be made, he plans to return this evening after work, he will notify either myself or attending team when he has made decisions Plan for now is continue current interventions I am very concerned that patient is approaching end of life regardless of interventions  Code Status: Full code  Prognosis:  Unable to determine  Discharge Planning: To Be Determined  Care plan was discussed with patient's son- Jefm Miles and daughter in law- Patience  Thank you for allowing the Palliative Medicine Team to assist in the care of this patient.  Total time:  90 minutes  Greater than 50%  of this time was spent counseling and coordinating care related to the above assessment and plan.  Mariana Kaufman, AGNP-C Palliative Medicine   Please contact Palliative Medicine Team phone at [phone] for questions and concerns.

## 2022-06-16 NOTE — Progress Notes (Signed)
PROGRESS NOTE    Cody Macdonald  LPF:[id number] DOB: 08-Jan-1961 DOA: 06/08/2022 PCP: Merryl Hacker, No   Brief Narrative:  Cody Macdonald is an 61 y.o. male past medical history of stroke, essential hypertension end-stage renal disease on hemodialysis Monday Wednesday and Friday sent from skilled nursing facility due to dyspnea hypertensive urgency and hyperkalemia requiring 10 L high flow nasal cannula in the ED chest x-ray showing pulmonary edema SARS-CoV-2 PCR was negative.  Admitted initially to the ICU nephrology was consulted for emergent dialysis, started on Cleviprex and empiric antibiotic's vancomycin and cefepime.  [facility] assumed care on 06/11/2022.  He continues to get dialysis and oxygen requirement is weaning and he was on 4 L.  Palliative is involved for goals of care discussion and he remains full code as patient's son will speak with his mother about the decisions that will be need to be made.    Assessment and Plan: No notes have been filed under this hospital service. Service: Hospitalist   Acute respiratory failure with hypoxia secondary to to influenza A infection r/o Other infection - oxygen to keep sats greater than 90%.  -SpO2: 100 % O2 Flow Rate (L/min): 4 L/min FiO2 (%): (!) 4 % -Plan to wean his oxygen down in the next 24 hours.  -On Droplet Precautions -Procalcitonin Level was >150.00 -Lactic Acid Level went from 1.5 -> 1.2 -Empirically on broad spectrum IV antibiotics with IV Cefepime and IV vancomycin given his continued spiking temperatures -C/w Albuterol 2.5 mg q3hprn Wheezing -Repeat chest x-ray in the a.m.   Hypertensive urgency.  -BP parameters have improved.  -C/w Labetalol 300 mg po BID and Labetalol 10 mg IV q55min PRN High BP -C/w Hydralazine 50 mg po q8h and with IV Hydralazine 10-40 mg q4hprn    ESRD on HD.  -Further management as per nephrology. -Patient's BUNs/creatinine went from 55/5.36 and is now 39/5.22 -Avoid further nephrotoxic  medications, contrast dyes, hypotension and dehydration to ensure adequate renal perfusion -Repeat CMP in a.m.   H/o CVA -On Aspirin and Clopidogrel , on dysphagia  1 diet.    Anemia of Chronic Kidney Disease  -Stable -Hemoglobin/hematocrit went from 9.3/29.1 -> 8.5/26.6 -> 9.4/29.0 -Check Anemia Panel in the AM  -We will continue to monitor for signs and symptoms of bleeding; no overt bleeding noted -Repeat CBC in a.m.  Hypoalbuminemia -Patient albumin level was 2.7 on last check -Continue monitor and trend and repeat CMP in the AM   Acute Metabolic Encephalopathy: -Probably sec to influenza A infection, improving mental status today. More alert and saying yes and no to questions but did not respond to any of my questioning -Speech evaluated the patient recommended dysphagia 1 diet. -Changed antibiotics to IV Vanco and cefepime given that he continued to spike fevers.   Severe Malnutrition in the Context of Chronic Illness/cachexia -Nutrition Status: Nutrition Problem: Severe Malnutrition Etiology: chronic illness (ESRD on HD) Signs/Symptoms: severe muscle depletion, severe fat depletion Interventions: Refer to RD note for recommendations -Estimated body mass index is 15.49 kg/m as calculated from the following:   Height as of this encounter: 5\' 8"  (1.727 m).   Weight as of this encounter: 46.2 kg.  DVT prophylaxis: heparin injection 5,000 Units Start: 06/08/22 2200 SCDs Start: 06/08/22 1543    Code Status: Full Code Family Communication: No family present at bedside  Disposition Plan:  Level of care: Progressive Status is: Inpatient Remains inpatient appropriate because: Needs further goals of care discussion and also needs him and his respiratory  status   Consultants:  Nephrology Palliative care medicine  Procedures:  As delineated as above  Antimicrobials:  Anti-infectives (From admission, onward)    Start     Dose/Rate Route Frequency Ordered Stop    06/15/22 0900  ceFEPIme (MAXIPIME) 2 g in sodium chloride 0.9 % 100 mL IVPB        2 g 200 mL/hr over 30 Minutes Intravenous  Once 06/15/22 0804 06/15/22 1044   06/14/22 1200  ceFEPIme (MAXIPIME) 2 g in sodium chloride 0.9 % 100 mL IVPB        2 g 200 mL/hr over 30 Minutes Intravenous Every M-W-F (Hemodialysis) 06/14/22 1008     06/14/22 1200  vancomycin (VANCOREADY) IVPB 500 mg/100 mL        500 mg 100 mL/hr over 60 Minutes Intravenous Every M-W-F (Hemodialysis) 06/14/22 1008     06/14/22 1100  vancomycin (VANCOCIN) IVPB 1000 mg/200 mL premix        1,000 mg 200 mL/hr over 60 Minutes Intravenous  Once 06/14/22 1000 06/14/22 1600   06/14/22 1100  ceFEPIme (MAXIPIME) 2 g in sodium chloride 0.9 % 100 mL IVPB        2 g 200 mL/hr over 30 Minutes Intravenous  Once 06/14/22 1000 06/14/22 1259   06/12/22 2000  oseltamivir (TAMIFLU) capsule 30 mg        30 mg Oral  Once 06/12/22 1440 06/12/22 2149   06/10/22 2100  oseltamivir (TAMIFLU) capsule 30 mg        30 mg Oral  Once 06/10/22 0825 06/11/22 0517   06/10/22 1030  cefTRIAXone (ROCEPHIN) 2 g in sodium chloride 0.9 % 100 mL IVPB  Status:  Discontinued        2 g 200 mL/hr over 30 Minutes Intravenous Every 24 hours 06/10/22 0945 06/14/22 1000   06/09/22 2200  ceFEPIme (MAXIPIME) 1 g in sodium chloride 0.9 % 100 mL IVPB  Status:  Discontinued        1 g 200 mL/hr over 30 Minutes Intravenous Every 24 hours 06/08/22 1611 06/10/22 0945   06/09/22 1015  oseltamivir (TAMIFLU) capsule 30 mg        30 mg Oral  Once 06/09/22 0920 06/09/22 1045   06/08/22 1830  oseltamivir (TAMIFLU) capsule 30 mg  Status:  Discontinued        30 mg Oral  Once 06/08/22 1818 06/09/22 0920   06/08/22 1612  vancomycin variable dose per unstable renal function (pharmacist dosing)  Status:  Discontinued         Does not apply See admin instructions 06/08/22 1612 06/09/22 0920   06/08/22 1100  vancomycin (VANCOCIN) IVPB 1000 mg/200 mL premix        1,000 mg 200 mL/hr over  60 Minutes Intravenous  Once 06/08/22 1057 06/08/22 1329   06/08/22 1100  ceFEPIme (MAXIPIME) 2 g in sodium chloride 0.9 % 100 mL IVPB        2 g 200 mL/hr over 30 Minutes Intravenous  Once 06/08/22 1057 06/08/22 1225       Subjective: Seen and examined at bedside in the dialysis unit and he will look at you with his eyes but not verbally respond to questioning.  Subjective history is limited given his current condition.  He is awake but not fully alert.  No other concerns or close at this time.  Objective: Vitals:   06/15/22 2354 06/16/22 0404 06/16/22 0500 06/16/22 0735  BP: (!) 156/82 130/73  (!) 151/77  Pulse:  87 82  87  Resp: (!) 21 (!) 22  19  Temp: (!) 97.3 F (36.3 C) (!) 97.2 F (36.2 C)  97.9 F (36.6 C)  TempSrc: Oral Oral  Oral  SpO2: 96% 98%  100%  Weight: 45 kg  45 kg   Height:        Intake/Output Summary (Last 24 hours) at 06/16/2022 2706 Last data filed at 06/15/2022 2300 Gross per 24 hour  Intake 0 ml  Output --  Net 0 ml   Filed Weights   06/15/22 0455 06/15/22 2354 06/16/22 0500  Weight: 46.2 kg 45 kg 45 kg   Examination: Physical Exam:  Constitutional: Thin cachectic chronically ill-appearing African-American male in no acute distress in the dialysis unit Respiratory: Diminished to auscultation bilaterally, no wheezing, rales, rhonchi or crackles. Normal respiratory effort and patient is not tachypenic. No accessory muscle use.  Wearing supplemental oxygen via nasal cannula Cardiovascular: RRR, no murmurs / rubs / gallops. S1 and S2 auscultated. No extremity edema. Abdomen: Soft, non-tender, non-distended. Bowel sounds positive.  GU: Deferred. Musculoskeletal: No clubbing / cyanosis of digits/nails. No joint deformity upper and lower extremities.  Neurologic: He does not really respond to participate in a neurological examination Psychiatric: Unable to assess due to his current condition but has a very flat affect  Data Reviewed: I have personally  reviewed following labs and imaging studies  CBC: Recent Labs  Lab 06/10/22 0509 06/11/22 0512 06/12/22 0403 06/14/22 1026 06/16/22 0041  WBC 5.8 4.2 3.9* 3.8* 4.9  NEUTROABS  --   --  3.0 2.3 3.1  HGB 9.0* 9.0* 9.3* 8.5* 9.4*  HCT 28.2* 28.5* 29.1* 26.6* 29.0*  MCV 90.7 89.9 89.5 89.3 89.0  PLT 174 187 174 201 237   Basic Metabolic Panel: Recent Labs  Lab 06/09/22 1100 06/10/22 0509 06/11/22 0512 06/12/22 0403 06/16/22 0041  NA 134* 132* 132* 132* 136  K 4.2 4.8 3.7 4.4 3.9  CL  --  93* 92* 94* 95*  CO2  --  22 26 23 25   GLUCOSE  --  74 84 80 87  BUN  --  55* 30* 55* 39*  CREATININE  --  5.47* 3.43* 5.36* 5.22*  CALCIUM  --  8.3* 8.1* 7.8* 8.6*  MG  --  1.7  --  1.8  --   PHOS  --  7.7*  --   --   --    GFR: Estimated Creatinine Clearance: 9.6 mL/min (A) (by C-G formula based on SCr of 5.22 mg/dL (H)). Liver Function Tests: Recent Labs  Lab 06/12/22 0403  AST 312*  ALT 97*  ALKPHOS 57  BILITOT 0.3  PROT 6.5  ALBUMIN 2.7*   No results for input(s): "LIPASE", "AMYLASE" in the last 168 hours. No results for input(s): "AMMONIA" in the last 168 hours. Coagulation Profile: No results for input(s): "INR", "PROTIME" in the last 168 hours. Cardiac Enzymes: No results for input(s): "CKTOTAL", "CKMB", "CKMBINDEX", "TROPONINI" in the last 168 hours. BNP (last 3 results) No results for input(s): "PROBNP" in the last 8760 hours. HbA1C: No results for input(s): "HGBA1C" in the last 72 hours. CBG: Recent Labs  Lab 06/10/22 0751 06/10/22 1112 06/10/22 1522 06/11/22 2048 06/13/22 0227  GLUCAP 79 87 97 89 86   Lipid Profile: No results for input(s): "CHOL", "HDL", "LDLCALC", "TRIG", "CHOLHDL", "LDLDIRECT" in the last 72 hours. Thyroid Function Tests: No results for input(s): "TSH", "T4TOTAL", "FREET4", "T3FREE", "THYROIDAB" in the last 72 hours. Anemia Panel: No results for  input(s): "VITAMINB12", "FOLATE", "FERRITIN", "TIBC", "IRON", "RETICCTPCT" in the last  72 hours. Sepsis Labs: Recent Labs  Lab 06/10/22 0509 06/11/22 0512 06/12/22 0026 06/12/22 0403 06/12/22 0649  PROCALCITON >150.00 >150.00 >150.00  --   --   LATICACIDVEN 1.2  --   --  1.5 1.2    Recent Results (from the past 240 hour(s))  Culture, blood (Routine x 2)     Status: None   Collection Time: 06/08/22 11:24 AM   Specimen: BLOOD RIGHT HAND  Result Value Ref Range Status   Specimen Description   Final    BLOOD RIGHT HAND Performed at [facility] [address] [place], [place] [address]    Special Requests   Final    BOTTLES DRAWN AEROBIC AND ANAEROBIC Blood Culture adequate volume Performed at [facility] [address] [place], [place] [address]    Culture   Final    NO GROWTH 5 DAYS Performed at [facility], [place] [address], [place], [place] [address]    Report Status 06/13/2022 FINAL  Final  Culture, blood (Routine x 2)     Status: None   Collection Time: 06/08/22 11:24 AM   Specimen: Right Antecubital; Blood  Result Value Ref Range Status   Specimen Description   Final    RIGHT ANTECUBITAL BLOOD Performed at [facility], [place] [address], [place], [place] [address]    Special Requests   Final    BOTTLES DRAWN AEROBIC AND ANAEROBIC Blood Culture adequate volume Performed at [facility] [address], [place], [place] [address]    Culture   Final    NO GROWTH 5 DAYS Performed at [facility], [place] [address], [place], [place] [address]    Report Status 06/13/2022 FINAL  Final  Resp Panel by RT-PCR (Flu A&B, Covid) Anterior Nasal Swab     Status: Abnormal   Collection Time: 06/08/22 11:36 AM   Specimen: Anterior Nasal Swab  Result Value Ref Range Status   SARS Coronavirus 2 by RT PCR NEGATIVE NEGATIVE Final    Comment: (NOTE) SARS-CoV-2 target nucleic acids are NOT DETECTED.  The SARS-CoV-2 RNA is generally detectable in upper respiratory specimens during the acute  phase of infection. The lowest concentration of SARS-CoV-2 viral copies this assay can detect is 138 copies/mL. A negative result does not preclude SARS-Cov-2 infection and should not be used as the sole basis for treatment or other patient management decisions. A negative result may occur with  improper specimen collection/handling, submission of specimen other than nasopharyngeal swab, presence of viral mutation(s) within the areas targeted by this assay, and inadequate number of viral copies(<138 copies/mL). A negative result must be combined with clinical observations, patient history, and epidemiological information. The expected result is Negative.  Fact Sheet for Patients:  EntrepreneurPulse.com.au  Fact Sheet for Healthcare Providers:  IncredibleEmployment.be  This test is no t yet approved or cleared by the [place] FDA and  has been authorized for detection and/or diagnosis of SARS-CoV-2 by FDA under an Emergency Use Authorization (EUA). This EUA will remain  in effect (meaning this test can be used) for the duration of the COVID-19 declaration under Section 564(b)(1) of the Act, 21 U.S.C.section 360bbb-3(b)(1), unless the authorization is terminated  or revoked sooner.       Influenza A by PCR POSITIVE (A) NEGATIVE Final   Influenza B by PCR NEGATIVE NEGATIVE Final    Comment: (NOTE) The Xpert Xpress  SARS-CoV-2/FLU/RSV plus assay is intended as an aid in the diagnosis of influenza from Nasopharyngeal swab specimens and should not be used as a sole basis for treatment. Nasal washings and aspirates are unacceptable for Xpert Xpress SARS-CoV-2/FLU/RSV testing.  Fact Sheet for Patients: EntrepreneurPulse.com.au  Fact Sheet for Healthcare Providers: IncredibleEmployment.be  This test is not yet approved or cleared by the [place] FDA and has been authorized for detection and/or diagnosis  of SARS-CoV-2 by FDA under an Emergency Use Authorization (EUA). This EUA will remain in effect (meaning this test can be used) for the duration of the COVID-19 declaration under Section 564(b)(1) of the Act, 21 U.S.C. section 360bbb-3(b)(1), unless the authorization is terminated or revoked.  Performed at [facility], [place] [address] [place], [place] [address]   MRSA Next Gen by PCR, Nasal     Status: None   Collection Time: 06/08/22  7:01 PM   Specimen: Nasal Mucosa; Nasal Swab  Result Value Ref Range Status   MRSA by PCR Next Gen NOT DETECTED NOT DETECTED Final    Comment: (NOTE) The GeneXpert MRSA Assay (FDA approved for NASAL specimens only), is one component of a comprehensive MRSA colonization surveillance program. It is not intended to diagnose MRSA infection nor to guide or monitor treatment for MRSA infections. Test performance is not FDA approved in patients less than 50 years old. Performed at [facility], [place] [address], [place], [place] [address]   Culture, blood (Routine X 2) w Reflex to ID Panel     Status: None (Preliminary result)   Collection Time: 06/14/22 10:26 AM   Specimen: BLOOD  Result Value Ref Range Status   Specimen Description   Final    BLOOD BLOOD RIGHT ARM AEROBIC BOTTLE ONLY ANAEROBIC BOTTLE ONLY   Special Requests   Final    BOTTLES DRAWN AEROBIC AND ANAEROBIC Blood Culture adequate volume   Culture   Final    NO GROWTH < 24 HOURS Performed at [facility], [place] [address] [place], [place] [address]    Report Status PENDING  Incomplete  Culture, blood (Routine X 2) w Reflex to ID Panel     Status: None (Preliminary result)   Collection Time: 06/14/22 10:26 AM   Specimen: BLOOD RIGHT ARM  Result Value Ref Range Status   Specimen Description   Final    BLOOD RIGHT ARM AEROBIC BOTTLE ONLY ANAEROBIC BOTTLE ONLY   Special Requests   Final    BOTTLES DRAWN AEROBIC AND ANAEROBIC Blood Culture adequate volume    Culture   Final    NO GROWTH < 24 HOURS Performed at [facility], [place] [address], [place], [place] [address]    Report Status PENDING  Incomplete    Radiology Studies: No results found.  Scheduled Meds:  aspirin  81 mg Oral Daily   Chlorhexidine Gluconate Cloth  6 each Topical Q0600   Chlorhexidine Gluconate Cloth  6 each Topical Q0600   clopidogrel  75 mg Oral Daily   darbepoetin (ARANESP) injection - DIALYSIS  60 mcg Subcutaneous Q Thu-1800   diclofenac Sodium  4 g Topical Once   feeding supplement  237 mL Oral BID BM   heparin  5,000 Units Subcutaneous Q8H   hydrALAZINE  50 mg Oral Q8H   labetalol  300 mg Oral BID   multivitamin  1 tablet Oral QHS   mouth rinse  15 mL Mouth Rinse 4 times per day   pantoprazole  40 mg Oral Daily   sevelamer carbonate  1,600 mg Oral TID WC   Continuous Infusions:  ceFEPime (MAXIPIME) IV     vancomycin Stopped (06/14/22 2021)    LOS: 8 days   Raiford Noble, DO [facility] Available via Epic secure chat 7am-7pm After these hours, please refer to coverage provider listed on [email or web address] 06/16/2022, 7:52 AM

## 2022-06-16 NOTE — Progress Notes (Signed)
Nephrology Follow-Up Consult note   Assessment/Recommendations: Cody Macdonald is a/an 61 y.o. male with a past medical history significant for ESRD, admitted for AHRF and influenza.     Subjective: seen in pt's room. Lying in bed, no c/o's.   Physical Exam: Vitals:   06/12/22 0936 06/12/22 1200  BP: (!) 145/78 131/74  Pulse: 88 79  Resp:  (!) 24  Temp:  98.2 F (36.8 C)  SpO2:  97%  Constitutional: tired appearing, responding w/ groans and nods CV: normal rate, no edema Respiratory: bilateral chest rise, mild iwob Gastrointestinal: soft, non-tender, no palpable masses  Skin: no visible lesions or rashes Ext: no edema bilat  Psych: alert  LUA AVG+bruit/ RIJ TDC  OP HD: MWF at [address]  3.5h  48kg  2.2/5 bath  300/600  Hep none R AVG/ TDC (using AVG)   CXR 11/10- bilat splotchy perihilar disease, edema vs infxn, improved compared to 11/8 film  Assessment/ Plan: 1.  Acute hypoxic respiratory failure: due to combination of pulmonary edema/volume overload with bronchospasm along with influenza +/- possible HCAP.  Still on 4 L O2 [place].  2. Altered mental status: per staff at [facility], pt will often be awake and alert at the OP HD unit but will not answer questions or will not respond in general.  3.  ESRD: on HD MWF.  HD today 4. HD access: had TDC troubles initially but worked 11/11 for HD. We were told that the R AVG was ready to be used as well.  5.  HTN/ vol: Blood pressure good. 3kg under dry wt. BP's not tolerating further UF. Min UF next HD.  6.  Secondary hyperparathyroidism: Home binders ordered 7.  Anemia of chronic disease: Hemoglobin 9.  ESA ordered for 06/10/2022.  No iron for now   [name], MD 06/16/2022, 2:58 PM  Recent Labs  Lab 06/10/22 0509 06/11/22 0512 06/12/22 0403 06/14/22 1026 06/16/22 0041  HGB 9.0*   < > 9.3* 8.5* 9.4*  ALBUMIN  --   --  2.7*  --   --   CALCIUM 8.3*   < > 7.8*  --  8.6*  PHOS 7.7*  --   --   --   --    CREATININE 5.47*   < > 5.36*  --  5.22*  K 4.8   < > 4.4  --  3.9   < > = values in this interval not displayed.     Inpatient medications:  aspirin  81 mg Oral Daily   Chlorhexidine Gluconate Cloth  6 each Topical Q0600   Chlorhexidine Gluconate Cloth  6 each Topical Q0600   clopidogrel  75 mg Oral Daily   darbepoetin (ARANESP) injection - DIALYSIS  60 mcg Subcutaneous Q Thu-1800   diclofenac Sodium  4 g Topical Once   feeding supplement  237 mL Oral BID BM   heparin  5,000 Units Subcutaneous Q8H   hydrALAZINE  50 mg Oral Q8H   labetalol  300 mg Oral BID   multivitamin  1 tablet Oral QHS   mouth rinse  15 mL Mouth Rinse 4 times per day   pantoprazole  40 mg Oral Daily   sevelamer carbonate  1,600 mg Oral TID WC    ceFEPime (MAXIPIME) IV 2 g (06/16/22 1356)   vancomycin Stopped (06/16/22 1344)   acetaminophen, acetaminophen, albuterol, docusate sodium, hydrALAZINE, labetalol, mouth rinse, polyethylene glycol

## 2022-06-16 NOTE — Progress Notes (Signed)
Pt refused all PO meds, when attempting to give applesauce pt holding sauce in the mouth and refusing to swallow, sauce suctioned out and cleaned pt's mouth to prevent aspiration. MD notified, family notified.

## 2022-06-16 NOTE — Progress Notes (Signed)
PO Medication held, patient alert but not following commands.   Drink offered and patient seemed to pocket the intake, oral suction offered to clear mouth.   Patient refusing any other oral intake.

## 2022-06-16 NOTE — Progress Notes (Signed)
Pt has PRN bipap orders, no distress noted at this time. 

## 2022-06-17 DIAGNOSIS — J189 Pneumonia, unspecified organism: Secondary | ICD-10-CM | POA: Diagnosis not present

## 2022-06-17 DIAGNOSIS — R64 Cachexia: Secondary | ICD-10-CM | POA: Diagnosis not present

## 2022-06-17 DIAGNOSIS — R627 Adult failure to thrive: Secondary | ICD-10-CM | POA: Diagnosis not present

## 2022-06-17 DIAGNOSIS — J9601 Acute respiratory failure with hypoxia: Secondary | ICD-10-CM | POA: Diagnosis not present

## 2022-06-17 DIAGNOSIS — I160 Hypertensive urgency: Secondary | ICD-10-CM | POA: Diagnosis not present

## 2022-06-17 DIAGNOSIS — J101 Influenza due to other identified influenza virus with other respiratory manifestations: Secondary | ICD-10-CM | POA: Diagnosis not present

## 2022-06-17 LAB — CBC WITH DIFFERENTIAL/PLATELET
Abs Immature Granulocytes: 0.05 10*3/uL (ref 0.00–0.07)
Basophils Absolute: 0 10*3/uL (ref 0.0–0.1)
Basophils Relative: 0 %
Eosinophils Absolute: 0.1 10*3/uL (ref 0.0–0.5)
Eosinophils Relative: 2 %
HCT: 30.2 % — ABNORMAL LOW (ref 39.0–52.0)
Hemoglobin: 9.6 g/dL — ABNORMAL LOW (ref 13.0–17.0)
Immature Granulocytes: 1 %
Lymphocytes Relative: 16 %
Lymphs Abs: 1 10*3/uL (ref 0.7–4.0)
MCH: 28.3 pg (ref 26.0–34.0)
MCHC: 31.8 g/dL (ref 30.0–36.0)
MCV: 89.1 fL (ref 80.0–100.0)
Monocytes Absolute: 0.4 10*3/uL (ref 0.1–1.0)
Monocytes Relative: 7 %
Neutro Abs: 4.6 10*3/uL (ref 1.7–7.7)
Neutrophils Relative %: 74 %
Platelets: 215 10*3/uL (ref 150–400)
RBC: 3.39 MIL/uL — ABNORMAL LOW (ref 4.22–5.81)
RDW: 15 % (ref 11.5–15.5)
WBC: 6.2 10*3/uL (ref 4.0–10.5)
nRBC: 0 % (ref 0.0–0.2)

## 2022-06-17 LAB — COMPREHENSIVE METABOLIC PANEL
ALT: 47 U/L — ABNORMAL HIGH (ref 0–44)
AST: 79 U/L — ABNORMAL HIGH (ref 15–41)
Albumin: 2.1 g/dL — ABNORMAL LOW (ref 3.5–5.0)
Alkaline Phosphatase: 66 U/L (ref 38–126)
Anion gap: 17 — ABNORMAL HIGH (ref 5–15)
BUN: 28 mg/dL — ABNORMAL HIGH (ref 6–20)
CO2: 22 mmol/L (ref 22–32)
Calcium: 8.9 mg/dL (ref 8.9–10.3)
Chloride: 96 mmol/L — ABNORMAL LOW (ref 98–111)
Creatinine, Ser: 3.89 mg/dL — ABNORMAL HIGH (ref 0.61–1.24)
GFR, Estimated: 17 mL/min — ABNORMAL LOW (ref >60)
Glucose, Bld: 66 mg/dL — ABNORMAL LOW (ref 70–99)
Potassium: 3.7 mmol/L (ref 3.5–5.1)
Sodium: 135 mmol/L (ref 135–145)
Total Bilirubin: 1.2 mg/dL (ref 0.3–1.2)
Total Protein: 7.1 g/dL (ref 6.5–8.1)

## 2022-06-17 LAB — PHOSPHORUS: Phosphorus: 4.6 mg/dL (ref 2.5–4.6)

## 2022-06-17 LAB — MAGNESIUM: Magnesium: 1.7 mg/dL (ref 1.7–2.4)

## 2022-06-17 MED ORDER — CHLORHEXIDINE GLUCONATE CLOTH 2 % EX PADS
6.0000 | MEDICATED_PAD | Freq: Every day | CUTANEOUS | Status: DC
  Administered 2022-06-18 – 2022-06-19 (×2): 6 via TOPICAL

## 2022-06-17 MED ORDER — SODIUM CHLORIDE 0.9 % IV SOLN: INTRAVENOUS | Status: AC

## 2022-06-17 NOTE — Progress Notes (Signed)
No Bipap needed at this time. Pt resting comfortably with stable VS. No respiratory distress noted.

## 2022-06-17 NOTE — Progress Notes (Signed)
Pharmacy Antibiotic Note  Cody Macdonald is a 61 y.o. male admitted on 06/08/2022 with suspected pneumonia.  Pharmacy has been consulted for Vancomycin and Cefepime dosing.  WBC 6.2, Tmax 100.5 Pt still requiring 4L [place]  ESRD on HD MWF - last HD 11/15 - cefepime and vancomycin given appropriately in HD  Plan: Continue Cefepime 2g IV every MWF with dialysis Continue Vancomycin 500mg  IV every MWF with dialysis Plan to check vancomycin random level prior to HD on 11/17    > Goal trough level = 15-20 Monitor daily CBC, temp, SCr, and for clinical signs of improvement  F/u cultures and de-escalate antibiotics as able   Height: 5\' 8"  (172.7 cm) Weight: 43.3 kg (95 lb 7.4 oz) IBW/kg (Calculated) : 68.4  Temp (24hrs), Avg:99 F (37.2 C), Min:97.8 F (36.6 C), Max:100.5 F (38.1 C)  Recent Labs  Lab 06/11/22 0512 06/12/22 0403 06/12/22 0649 06/14/22 1026 06/16/22 0041 06/17/22 0034  WBC 4.2 3.9*  --  3.8* 4.9 6.2  CREATININE 3.43* 5.36*  --   --  5.22* 3.89*  LATICACIDVEN  --  1.5 1.2  --   --   --      Estimated Creatinine Clearance: 12.4 mL/min (A) (by C-G formula based on SCr of 3.89 mg/dL (H)).    No Known Allergies  Antimicrobials this admission: Ceftriaxone 11/7 x1, 11/9 >> 11/12 Cefepime 11/8 x1, 11/13 >>  Vancomycin 11/7 x1, 11/13 >>   Dose adjustments this admission: None  Microbiology results: 11/7 BCx x2: NG (final)  11/7 MRSA PCR: not detected 11/13 BCx x2: NGTD x2d  Thank you for allowing pharmacy to be a part of this patient's care.  Luisa Hart, PharmD, BCPS Clinical Pharmacist 06/17/2022 7:53 AM   Please refer to [facility] for pharmacy phone number

## 2022-06-17 NOTE — Progress Notes (Signed)
Daily Progress Note   Patient Name: Cody Macdonald       Date: 06/17/2022 DOB: 11-May-1961  Age: 61 y.o. MRN#: [id number] Attending Physician: Kerney Elbe, DO Primary Care Physician: Pcp, No Admit Date: 06/08/2022  Reason for Consultation/Follow-up: Establishing goals of care  Patient Profile/HPI:  62 y.o. 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 Workup revealed sepsis possibly related pnuemonia, influenza A.  His mental status has waxed and waned during admission and he is with very poor po intake. Palliative medicine consulted for "end of life".     Subjective: Chart reviewed including labs, progress notes, imaging from this and previous encounters.  Evaluated patient.  Today he continues to be very weak.  He does not answer any of my questions.  He did not want to eat.  He appears very frail and very ill. He has wet sounding respirations.  I called Cody Macdonald for follow-up on our discussion yesterday. Cody Macdonald has not made decisions yet.  He is struggling.  He wishes to include a few other family members. Cody Macdonald visited him today and FaceTime with Cody Macdonald's ex-spouse and she was able to get him to interact minimally.  Encouraged Cody Macdonald to consider decisions within the context of Cody Macdonald's values and goals would be.  I also shared my concerns with Cody Macdonald that Cody Macdonald is at high risk of decompensating suddenly and in that case decisions would need to be made rapidly. I would not recommend resuscitation or for him to be put on artificial life support if he deteriorated further.  Encouraged patient/family to consider DNR/DNI status understanding evidenced based poor  outcomes in similar hospitalized patients, as the cause of the arrest is likely associated with chronic/terminal disease rather than a reversible acute cardio-pulmonary event.   Review of Systems  Unable to perform ROS: Mental status change     Physical Exam Vitals and nursing note reviewed.  Cardiovascular:     Rate and Rhythm: Normal rate.             Vital Signs: BP 126/72 (BP Location: Right Arm)   Pulse 97   Temp 97.9 F (36.6 C) (Axillary)   Resp (!) 24   Ht 5\' 8"  (1.727  m)   Wt 43.3 kg   SpO2 97%   BMI 14.51 kg/m  SpO2: SpO2: 97 % O2 Device: O2 Device: Nasal Cannula O2 Flow Rate: O2 Flow Rate (L/min): 4 L/min  Intake/output summary:  Intake/Output Summary (Last 24 hours) at 06/17/2022 1651 Last data filed at 06/17/2022 1300 Gross per 24 hour  Intake 120 ml  Output 0 ml  Net 120 ml   LBM: Last BM Date : 06/12/22 Baseline Weight: Weight: 48.1 kg Most recent weight: Weight: 43.3 kg       Palliative Assessment/Data: PPS: 20%      Patient Active Problem List   Diagnosis Date Noted   Protein-calorie malnutrition, severe 06/12/2022   Influenza A 06/09/2022   Acute respiratory failure with hypoxia ([place]) 06/09/2022   HCAP (healthcare-associated pneumonia) 06/08/2022   Hypertensive urgency 06/08/2022   Hypertension 04/05/2022   Hyperkalemia 04/05/2022   Pulmonary edema 04/05/2022   Volume overload 04/04/2022   Malnutrition of moderate degree 12/26/2021   ESRD (end stage renal disease) ([place]) 12/24/2021   Symptomatic anemia 12/24/2021   History of stroke 12/24/2021   Tobacco abuse 12/24/2021   Alcohol abuse 12/24/2021   Hypoalbuminemia 12/24/2021   AKI (acute kidney injury) ([place]) 12/24/2021   Abdominal distention 12/24/2021   Leg edema 12/24/2021    Palliative Care Assessment & Plan    Assessment/Recommendations/Plan  Continue current plan of care Palliative team will continue to touch base with family and discuss goals of care   Code  Status: Full code  Prognosis:  Unable to determine  Discharge Planning: To Be Determined  Care plan was discussed with son and care team.   Thank you for allowing the Palliative Medicine Team to assist in the care of this patient.   Greater than 50%  of this time was spent counseling and coordinating care related to the above assessment and plan.  Mariana Kaufman, AGNP-C Palliative Medicine   Please contact Palliative Medicine Team phone at [phone] for questions and concerns.

## 2022-06-17 NOTE — Progress Notes (Signed)
Nephrology Follow-Up Consult note   Assessment/Recommendations: Cody Macdonald is a/an 61 y.o. male with a past medical history significant for ESRD, admitted for AHRF and influenza.     Subjective: seen in pt's room. Lying in bed, no c/o's.   Physical Exam: Vitals:   06/12/22 0936 06/12/22 1200  BP: (!) 145/78 131/74  Pulse: 88 79  Resp:  (!) 24  Temp:  98.2 F (36.8 C)  SpO2:  97%  Constitutional: tired appearing, responding w/ groans and nods Cachectic throughout the body CV: normal rate, no edema Respiratory: bilateral chest rise, mild iwob Gastrointestinal: soft, non-tender, no palpable masses  Skin: no visible lesions or rashes Ext: no edema bilat  Psych: alert  LUA AVG+bruit/ RIJ TDC  OP HD: MWF at [facility] [address]  3.5h  48kg  2.2/5 bath  300/600  Hep none R AVG/ TDC (AVG ok to use reportedly)   CXR 11/10- bilat splotchy perihilar disease, edema vs infxn, improved compared to 11/8 film  Assessment/ Plan: 1.  Acute hypoxic respiratory failure: due to combination of pulmonary edema/volume overload with bronchospasm along with influenza +/- and probable HCAP.  Still on 4 L O2 [place] > will ask to wean to RA as his repeat CXR yest shows near total clearing of infiltrates.  2. Altered mental status: per staff at [facility] is not unusual for him not to respond verbally at the OP unit.  3.  ESRD: on HD MWF.  HD Friday.  4. HD access: had TDC troubles initially but working now. We were told that the R AVG was ready to be used as well but we haven't been using here.  5.  HTN/ vol: Blood pressure good. Well under dry wt. BP's not tolerating further UF. Min UF next HD.  6.  Secondary hyperparathyroidism: Home binders ordered 7.  Anemia of chronic disease: Hemoglobin 9.  ESA ordered for 06/10/2022.  No iron for now 8.  GOC - pt extremely frail, not interacting very much verbally. Appreciate palliative assistance. Poor prognosis.    [name], MD 06/17/2022, 12:24  PM  Recent Labs  Lab 06/12/22 0403 06/14/22 1026 06/16/22 0041 06/17/22 0034  HGB 9.3*   < > 9.4* 9.6*  ALBUMIN 2.7*  --   --  2.1*  CALCIUM 7.8*  --  8.6* 8.9  PHOS  --   --   --  4.6  CREATININE 5.36*  --  5.22* 3.89*  K 4.4  --  3.9 3.7   < > = values in this interval not displayed.     Inpatient medications:  aspirin  81 mg Oral Daily   Chlorhexidine Gluconate Cloth  6 each Topical Q0600   clopidogrel  75 mg Oral Daily   darbepoetin (ARANESP) injection - DIALYSIS  60 mcg Subcutaneous Q Thu-1800   diclofenac Sodium  4 g Topical Once   feeding supplement  237 mL Oral BID BM   heparin  5,000 Units Subcutaneous Q8H   hydrALAZINE  50 mg Oral Q8H   labetalol  300 mg Oral BID   multivitamin  1 tablet Oral QHS   mouth rinse  15 mL Mouth Rinse 4 times per day   pantoprazole  40 mg Oral Daily   sevelamer carbonate  1,600 mg Oral TID WC    ceFEPime (MAXIPIME) IV Stopped (06/16/22 1430)   vancomycin Stopped (06/16/22 1344)   acetaminophen, acetaminophen, albuterol, docusate sodium, hydrALAZINE, labetalol, mouth rinse, polyethylene glycol

## 2022-06-17 NOTE — Progress Notes (Signed)
PROGRESS NOTE    Cody Macdonald  QQV:[id number] DOB: 18-Feb-1961 DOA: 06/08/2022 PCP: Merryl Hacker, No   Brief Narrative:  Cody Macdonald is an 61 y.o. male past medical history of stroke, essential hypertension end-stage renal disease on hemodialysis Monday Wednesday and Friday sent from skilled nursing facility due to dyspnea hypertensive urgency and hyperkalemia requiring 10 L high flow nasal cannula in the ED chest x-ray showing pulmonary edema SARS-CoV-2 PCR was negative.  Admitted initially to the ICU nephrology was consulted for emergent dialysis, started on Cleviprex and empiric antibiotic's vancomycin and cefepime.  [facility] assumed care on 06/11/2022.  He continues to get dialysis and oxygen requirement is weaning and he was on 4 L.  Palliative is involved for goals of care discussion and he remains full code as patient's son has not made a decision yet given that he wishes to include few other family members and the shared decision-making.  Patient was little bit more awake today and continue not to talk but did interact a little bit.   Assessment and Plan:  Acute respiratory failure with hypoxia secondary to to influenza A infection r/o Other infection -[place] oxygen to keep sats greater than 90%.  -SpO2: 97 % O2 Flow Rate (L/min): 4 L/min FiO2 (%): (!) 4 % -Plan to wean his oxygen down in the next 24 hours.  -On Droplet Precautions -Procalcitonin Level was >150.00 -Lactic Acid Level went from 1.5 -> 1.2 -Empirically on broad spectrum IV antibiotics with IV Cefepime and IV vancomycin given his continued spiking temperatures -C/w Albuterol 2.5 mg q3hprn Wheezing -Chest x-ray done today and showed "Further mild improvement of bilateral perihilar opacities which may reflect edema or infection." -Repeat chest x-ray intermittently   Hypertensive Urgency.  -BP parameters have improved.  -C/w Labetalol 300 mg po BID and Labetalol 10 mg IV q59min PRN High BP -C/w Hydralazine 50 mg po q8h and with  IV Hydralazine 10-40 mg q4hprn    ESRD on HD.  Elevated anion gap -Further management as per nephrology. -Patient's BUNs/creatinine went from 55/5.36 -> 39/5.22 -> 28/3.89 -Patient's anion gap is now 17, chloride level is 96 and CO2 is 22 -Avoid further nephrotoxic medications, contrast dyes, hypotension and dehydration to ensure adequate renal perfusion -Repeat CMP in a.m.  Poor p.o. intake -Has been refusing to eat and not really eating very much -Start gentle IV fluid hydration with normal saline at 50 MLS per hour for 36 hours -Consult nutrition for further evaluation recommendations   H/o CVA -On Aspirin and Clopidogrel , on dysphagia  1 diet.    Anemia of Chronic Kidney Disease  -Stable -Hemoglobin/hematocrit went from 9.3/29.1 -> 8.5/26.6 -> 9.4/29.0 and is now 9.6/30.2 yes -Check Anemia Panel in the AM  -We will continue to monitor for signs and symptoms of bleeding; no overt bleeding noted -Repeat CBC in a.m.   Hypoalbuminemia -Patient albumin level was 2.1 on last check -Continue monitor and trend and repeat CMP in the AM   Acute Metabolic Encephalopathy -Probably sec to influenza A infection, improving mental status today. More alert and shaking head yes and no to questions but did not respond to any of my questioning -Speech evaluated the patient recommended dysphagia 1 diet. -Changed antibiotics to IV Vanco and cefepime given that he continued to spike fevers -He is awake and does answer questions while shaking his head yes and no but does not really respond after that  Abnormal LFTs -Patient's AST was 312 and then trended down to 79 and  AST went from 97 is now 47 -Continue to monitor and trend and repeat CMP in the a.m. if necessary will obtain a right upper quadrant ultrasound as well as a acute hepatitis panel -Repeat CMP in a.m.   Severe Malnutrition in the Context of Chronic Illness/cachexia -Nutrition Status: Nutrition Problem: Severe  Malnutrition Etiology: chronic illness (ESRD on HD) Signs/Symptoms: severe muscle depletion, severe fat depletion Interventions: Refer to RD note for recommendations -Estimated body mass index is 14.51 kg/m as calculated from the following:   Height as of this encounter: 5\' 8"  (1.727 m).   Weight as of this encounter: 43.3 kg.   DVT prophylaxis: heparin injection 5,000 Units Start: 06/08/22 2200 SCDs Start: 06/08/22 1543    Code Status: Full Code Family Communication: No family currently at bedside but palliative was able to speak with the patient's son  Disposition Plan:  Level of care: Progressive Status is: Inpatient Remains inpatient appropriate because: Patient not eating well and needs further improvement and weaning of his oxygen   Consultants:  Nephrology Palliative care  Procedures:  As delineated as above  Antimicrobials:  Anti-infectives (From admission, onward)    Start     Dose/Rate Route Frequency Ordered Stop   06/15/22 0900  ceFEPIme (MAXIPIME) 2 g in sodium chloride 0.9 % 100 mL IVPB        2 g 200 mL/hr over 30 Minutes Intravenous  Once 06/15/22 0804 06/15/22 1044   06/14/22 1200  ceFEPIme (MAXIPIME) 2 g in sodium chloride 0.9 % 100 mL IVPB        2 g 200 mL/hr over 30 Minutes Intravenous Every M-W-F (Hemodialysis) 06/14/22 1008     06/14/22 1200  vancomycin (VANCOREADY) IVPB 500 mg/100 mL        500 mg 100 mL/hr over 60 Minutes Intravenous Every M-W-F (Hemodialysis) 06/14/22 1008     06/14/22 1100  vancomycin (VANCOCIN) IVPB 1000 mg/200 mL premix        1,000 mg 200 mL/hr over 60 Minutes Intravenous  Once 06/14/22 1000 06/14/22 1600   06/14/22 1100  ceFEPIme (MAXIPIME) 2 g in sodium chloride 0.9 % 100 mL IVPB        2 g 200 mL/hr over 30 Minutes Intravenous  Once 06/14/22 1000 06/14/22 1259   06/12/22 2000  oseltamivir (TAMIFLU) capsule 30 mg        30 mg Oral  Once 06/12/22 1440 06/12/22 2149   06/10/22 2100  oseltamivir (TAMIFLU) capsule 30 mg         30 mg Oral  Once 06/10/22 0825 06/11/22 0517   06/10/22 1030  cefTRIAXone (ROCEPHIN) 2 g in sodium chloride 0.9 % 100 mL IVPB  Status:  Discontinued        2 g 200 mL/hr over 30 Minutes Intravenous Every 24 hours 06/10/22 0945 06/14/22 1000   06/09/22 2200  ceFEPIme (MAXIPIME) 1 g in sodium chloride 0.9 % 100 mL IVPB  Status:  Discontinued        1 g 200 mL/hr over 30 Minutes Intravenous Every 24 hours 06/08/22 1611 06/10/22 0945   06/09/22 1015  oseltamivir (TAMIFLU) capsule 30 mg        30 mg Oral  Once 06/09/22 0920 06/09/22 1045   06/08/22 1830  oseltamivir (TAMIFLU) capsule 30 mg  Status:  Discontinued        30 mg Oral  Once 06/08/22 1818 06/09/22 0920   06/08/22 1612  vancomycin variable dose per unstable renal function (pharmacist dosing)  Status:  Discontinued         Does not apply See admin instructions 06/08/22 1612 06/09/22 0920   06/08/22 1100  vancomycin (VANCOCIN) IVPB 1000 mg/200 mL premix        1,000 mg 200 mL/hr over 60 Minutes Intravenous  Once 06/08/22 1057 06/08/22 1329   06/08/22 1100  ceFEPIme (MAXIPIME) 2 g in sodium chloride 0.9 % 100 mL IVPB        2 g 200 mL/hr over 30 Minutes Intravenous  Once 06/08/22 1057 06/08/22 1225       Subjective: Seen and examined at bedside and he again is not as responsive and does not verbally answer questions but did shake his head yes and no.  Nursing states that she has been refusing medication not really eating very much.  No other concerns or complaints at this time.  Objective: Vitals:   06/17/22 0727 06/17/22 0917 06/17/22 1343 06/17/22 1459  BP: 115/65 106/65 134/76 126/72  Pulse: 88 89 96 97  Resp: (!) 23 (!) 22 (!) 24 (!) 24  Temp: 97.8 F (36.6 C) 97.8 F (36.6 C) 98.2 F (36.8 C) 97.9 F (36.6 C)  TempSrc: Axillary Oral Axillary Axillary  SpO2: 99% 98% 99% 97%  Weight:      Height:        Intake/Output Summary (Last 24 hours) at 06/17/2022 1820 Last data filed at 06/17/2022 1300 Gross per 24 hour   Intake 120 ml  Output 0 ml  Net 120 ml   Filed Weights   06/16/22 0801 06/16/22 1223 06/17/22 0417  Weight: 45.2 kg 44 kg 43.3 kg   Examination: Physical Exam:  Constitutional: Thin cachectic chronically ill-appearing African-American male currently no acute distress appears calm Respiratory: Diminished to auscultation bilaterally, no wheezing, rales, rhonchi or crackles. Normal respiratory effort and patient is not tachypenic. No accessory muscle use.  Unlabored breathing Cardiovascular: RRR, no murmurs / rubs / gallops. S1 and S2 auscultated. No extremity edema.  Abdomen: Soft, non-tender, non-distended.  Bowel sounds positive.  GU: Deferred. Musculoskeletal: No clubbing / cyanosis of digits/nails. No joint deformity upper and lower extremities.  Neurologic: Does not really want to participate in oral examination but does answer some questions by shaking head yes and sometimes no Psychiatric: Impaired judgment and insight  Data Reviewed: I have personally reviewed following labs and imaging studies  CBC: Recent Labs  Lab 06/11/22 0512 06/12/22 0403 06/14/22 1026 06/16/22 0041 06/17/22 0034  WBC 4.2 3.9* 3.8* 4.9 6.2  NEUTROABS  --  3.0 2.3 3.1 4.6  HGB 9.0* 9.3* 8.5* 9.4* 9.6*  HCT 28.5* 29.1* 26.6* 29.0* 30.2*  MCV 89.9 89.5 89.3 89.0 89.1  PLT 187 174 201 236 355   Basic Metabolic Panel: Recent Labs  Lab 06/11/22 0512 06/12/22 0403 06/16/22 0041 06/17/22 0034  NA 132* 132* 136 135  K 3.7 4.4 3.9 3.7  CL 92* 94* 95* 96*  CO2 26 23 25 22   GLUCOSE 84 80 87 66*  BUN 30* 55* 39* 28*  CREATININE 3.43* 5.36* 5.22* 3.89*  CALCIUM 8.1* 7.8* 8.6* 8.9  MG  --  1.8  --  1.7  PHOS  --   --   --  4.6   GFR: Estimated Creatinine Clearance: 12.4 mL/min (A) (by C-G formula based on SCr of 3.89 mg/dL (H)). Liver Function Tests: Recent Labs  Lab 06/12/22 0403 06/17/22 0034  AST 312* 79*  ALT 97* 47*  ALKPHOS 57 66  BILITOT 0.3 1.2  PROT 6.5 7.1  ALBUMIN 2.7* 2.1*    No results for input(s): "LIPASE", "AMYLASE" in the last 168 hours. No results for input(s): "AMMONIA" in the last 168 hours. Coagulation Profile: No results for input(s): "INR", "PROTIME" in the last 168 hours. Cardiac Enzymes: No results for input(s): "CKTOTAL", "CKMB", "CKMBINDEX", "TROPONINI" in the last 168 hours. BNP (last 3 results) No results for input(s): "PROBNP" in the last 8760 hours. HbA1C: No results for input(s): "HGBA1C" in the last 72 hours. CBG: Recent Labs  Lab 06/11/22 2048 06/13/22 0227  GLUCAP 89 86   Lipid Profile: No results for input(s): "CHOL", "HDL", "LDLCALC", "TRIG", "CHOLHDL", "LDLDIRECT" in the last 72 hours. Thyroid Function Tests: No results for input(s): "TSH", "T4TOTAL", "FREET4", "T3FREE", "THYROIDAB" in the last 72 hours. Anemia Panel: No results for input(s): "VITAMINB12", "FOLATE", "FERRITIN", "TIBC", "IRON", "RETICCTPCT" in the last 72 hours. Sepsis Labs: Recent Labs  Lab 06/11/22 0512 06/12/22 0026 06/12/22 0403 06/12/22 0649  PROCALCITON >150.00 >150.00  --   --   LATICACIDVEN  --   --  1.5 1.2    Recent Results (from the past 240 hour(s))  Culture, blood (Routine x 2)     Status: None   Collection Time: 06/08/22 11:24 AM   Specimen: BLOOD RIGHT HAND  Result Value Ref Range Status   Specimen Description   Final    BLOOD RIGHT HAND Performed at [facility] [address] [place], [place] [address]    Special Requests   Final    BOTTLES DRAWN AEROBIC AND ANAEROBIC Blood Culture adequate volume Performed at [facility] [address], [place], [place] [address]    Culture   Final    NO GROWTH 5 DAYS Performed at [facility], [place] [address] [place], [place] [address]    Report Status 06/13/2022 FINAL  Final  Culture, blood (Routine x 2)     Status: None   Collection Time: 06/08/22 11:24 AM   Specimen: Right Antecubital; Blood  Result Value Ref Range Status   Specimen  Description   Final    RIGHT ANTECUBITAL BLOOD Performed at [facility], [place] [address] [place], [place] [address]    Special Requests   Final    BOTTLES DRAWN AEROBIC AND ANAEROBIC Blood Culture adequate volume Performed at [facility] [address] [place], [place] [address]    Culture   Final    NO GROWTH 5 DAYS Performed at [facility], [place] [address] [place],  [address]    Report Status 06/13/2022 FINAL  Final  Resp Panel by RT-PCR (Flu A&B, Covid) Anterior Nasal Swab     Status: Abnormal   Collection Time: 06/08/22 11:36 AM   Specimen: Anterior Nasal Swab  Result Value Ref Range Status   SARS Coronavirus 2 by RT PCR NEGATIVE NEGATIVE Final    Comment: (NOTE) SARS-CoV-2 target nucleic acids are NOT DETECTED.  The SARS-CoV-2 RNA is generally detectable in upper respiratory specimens during the acute phase of infection. The lowest concentration of SARS-CoV-2 viral copies this assay can detect is 138 copies/mL. A negative result does not preclude SARS-Cov-2 infection and should not be used as the sole basis for treatment or other patient management decisions. A negative result may occur with  improper specimen collection/handling, submission of specimen other than nasopharyngeal swab, presence of viral mutation(s) within the areas targeted by this assay, and inadequate number of viral copies(<138 copies/mL). A negative result must be combined with clinical  observations, patient history, and epidemiological information. The expected result is Negative.  Fact Sheet for Patients:  EntrepreneurPulse.com.au  Fact Sheet for Healthcare Providers:  IncredibleEmployment.be  This test is no t yet approved or cleared by the [place] FDA and  has been authorized for detection and/or diagnosis of SARS-CoV-2 by FDA under an Emergency Use Authorization (EUA). This EUA will remain  in effect (meaning  this test can be used) for the duration of the COVID-19 declaration under Section 564(b)(1) of the Act, 21 U.S.C.section 360bbb-3(b)(1), unless the authorization is terminated  or revoked sooner.       Influenza A by PCR POSITIVE (A) NEGATIVE Final   Influenza B by PCR NEGATIVE NEGATIVE Final    Comment: (NOTE) The Xpert Xpress SARS-CoV-2/FLU/RSV plus assay is intended as an aid in the diagnosis of influenza from Nasopharyngeal swab specimens and should not be used as a sole basis for treatment. Nasal washings and aspirates are unacceptable for Xpert Xpress SARS-CoV-2/FLU/RSV testing.  Fact Sheet for Patients: EntrepreneurPulse.com.au  Fact Sheet for Healthcare Providers: IncredibleEmployment.be  This test is not yet approved or cleared by the [place] FDA and has been authorized for detection and/or diagnosis of SARS-CoV-2 by FDA under an Emergency Use Authorization (EUA). This EUA will remain in effect (meaning this test can be used) for the duration of the COVID-19 declaration under Section 564(b)(1) of the Act, 21 U.S.C. section 360bbb-3(b)(1), unless the authorization is terminated or revoked.  Performed at [facility], [place] [address] [place], [place] [address]   MRSA Next Gen by PCR, Nasal     Status: None   Collection Time: 06/08/22  7:01 PM   Specimen: Nasal Mucosa; Nasal Swab  Result Value Ref Range Status   MRSA by PCR Next Gen NOT DETECTED NOT DETECTED Final    Comment: (NOTE) The GeneXpert MRSA Assay (FDA approved for NASAL specimens only), is one component of a comprehensive MRSA colonization surveillance program. It is not intended to diagnose MRSA infection nor to guide or monitor treatment for MRSA infections. Test performance is not FDA approved in patients less than 78 years old. Performed at [facility], [place] [address], [place], [place] [address]   Culture, blood (Routine X 2) w  Reflex to ID Panel     Status: None (Preliminary result)   Collection Time: 06/14/22 10:26 AM   Specimen: BLOOD  Result Value Ref Range Status   Specimen Description   Final    BLOOD BLOOD RIGHT ARM AEROBIC BOTTLE ONLY ANAEROBIC BOTTLE ONLY   Special Requests   Final    BOTTLES DRAWN AEROBIC AND ANAEROBIC Blood Culture adequate volume   Culture   Final    NO GROWTH 3 DAYS Performed at [facility], [place] [address], [place], [place] [address]    Report Status PENDING  Incomplete  Culture, blood (Routine X 2) w Reflex to ID Panel     Status: None (Preliminary result)   Collection Time: 06/14/22 10:26 AM   Specimen: BLOOD RIGHT ARM  Result Value Ref Range Status   Specimen Description   Final    BLOOD RIGHT ARM AEROBIC BOTTLE ONLY ANAEROBIC BOTTLE ONLY   Special Requests   Final    BOTTLES DRAWN AEROBIC AND ANAEROBIC Blood Culture adequate volume   Culture   Final    NO GROWTH 3 DAYS Performed at [facility], [place] [address], [place], [place] [address]    Report Status PENDING  Incomplete  Radiology Studies: DG CHEST PORT 1 VIEW  Result Date: 06/16/2022 CLINICAL DATA:  Dyspnea, cough, and congestion. End-stage renal disease. EXAM: PORTABLE CHEST 1 VIEW COMPARISON:  Chest radiograph 06/11/2022 FINDINGS: A right jugular dialysis catheter terminates over the lower SVC, unchanged. The cardiac silhouette is normal in size. Bilateral perihilar airspace opacities demonstrate further mild improvement. No sizable pleural effusion or pneumothorax is identified. No acute osseous abnormality is seen. IMPRESSION: Further mild improvement of bilateral perihilar opacities which may reflect edema or infection. Electronically Signed   By: Logan Bores M.D.   On: 06/16/2022 08:23    Scheduled Meds:  aspirin  81 mg Oral Daily   Chlorhexidine Gluconate Cloth  6 each Topical Q0600   [START ON 06/18/2022] Chlorhexidine Gluconate Cloth  6 each Topical Q0600   clopidogrel  75 mg Oral Daily    darbepoetin (ARANESP) injection - DIALYSIS  60 mcg Subcutaneous Q Thu-1800   diclofenac Sodium  4 g Topical Once   feeding supplement  237 mL Oral BID BM   heparin  5,000 Units Subcutaneous Q8H   hydrALAZINE  50 mg Oral Q8H   labetalol  300 mg Oral BID   multivitamin  1 tablet Oral QHS   mouth rinse  15 mL Mouth Rinse 4 times per day   pantoprazole  40 mg Oral Daily   sevelamer carbonate  1,600 mg Oral TID WC   Continuous Infusions:  sodium chloride 50 mL/hr at 06/17/22 1754   ceFEPime (MAXIPIME) IV Stopped (06/16/22 1430)   vancomycin Stopped (06/16/22 1344)    LOS: 9 days   Raiford Noble, DO [facility] Available via Epic secure chat 7am-7pm After these hours, please refer to coverage provider listed on [email or web address] 06/17/2022, 6:20 PM

## 2022-06-17 NOTE — Progress Notes (Signed)
Speech Language Pathology Treatment: Dysphagia  Patient Details Name: Cody Macdonald MRN: [id number] DOB: 1961-04-11 Today's Date: 06/17/2022 Time: 8250-0370 SLP Time Calculation (min) (ACUTE ONLY): 15 min  Assessment / Plan / Recommendation Clinical Impression  Pt seen at bedside for follow up after diet resumed 06/15/22. Consulted with RN, who reports pt is pocketing everything, holding all presentations orally without swallowing.   Oral care was completed with suction, with successful removal of a minimal amount of residue. Following oral care, pt was given a small bolus of pudding. Neither lip closure nor oral manipulation was observed. Pt was unable to demonstrate ability to drink water from a straw. Pudding was suctioned out of pt's mouth, as he was unable to follow directions to spit it out.   It was anticipated that pt's appropriateness for PO intake was going to be highly variable. At this point, he is not swallowing anything, and will therefore not meet nutrition/hydration/medication needs PO. Recommend consideration of non-oral feeding method, if within pt/family wishes.    HPI HPI: Pt is a 61 y.o. male who presented to [facility] ED 11/7 with dyspnea, hypertensive urgency, hyperkalemia and was transferred to [facility] for dialysis. CXR 11/8: Progressed bilateral multilobar airspace opacity now with early  consolidation suspected. Differential considerations include  asymmetric pulmonary edema, bilateral pneumonia, developing ARDS. Pt found to have influenza A. PMH: ESRD on HD MWF, CKD, CVA, HTN, Etoh abuse tobacco abuse, anemia.      SLP Plan  Continue with current plan of care      Recommendations for follow up therapy are one component of a multi-disciplinary discharge planning process, led by the attending physician.  Recommendations may be updated based on patient status, additional functional criteria and insurance authorization.    Recommendations  Diet recommendations: Dysphagia 1  (puree);Thin liquid Liquids provided via: Straw Medication Administration: Crushed with puree Supervision: Trained caregiver to feed patient;Full supervision/cueing for compensatory strategies Compensations: Slow rate;Small sips/bites;Minimize environmental distractions;Follow solids with liquid Postural Changes and/or Swallow Maneuvers: Seated upright 90 degrees;Upright 30-60 min after meal                Oral Care Recommendations: Oral care QID;Staff/trained caregiver to provide oral care Follow Up Recommendations: Other (comment) (TBD) Assistance recommended at discharge: Frequent or constant Supervision/Assistance SLP Visit Diagnosis: Dysphagia, unspecified (R13.10) Plan: Continue with current plan of care          Latressa Harries B. Quentin Ore, [facility], [place] Speech Language Pathologist Office: [phone]  Shonna Chock 06/17/2022, 11:41 AM

## 2022-06-18 ENCOUNTER — Inpatient Hospital Stay (HOSPITAL_COMMUNITY): Payer: Medicaid Other

## 2022-06-18 DIAGNOSIS — J189 Pneumonia, unspecified organism: Secondary | ICD-10-CM | POA: Diagnosis not present

## 2022-06-18 DIAGNOSIS — E43 Unspecified severe protein-calorie malnutrition: Secondary | ICD-10-CM | POA: Diagnosis not present

## 2022-06-18 DIAGNOSIS — I160 Hypertensive urgency: Secondary | ICD-10-CM | POA: Diagnosis not present

## 2022-06-18 DIAGNOSIS — J101 Influenza due to other identified influenza virus with other respiratory manifestations: Secondary | ICD-10-CM | POA: Diagnosis not present

## 2022-06-18 DIAGNOSIS — J9601 Acute respiratory failure with hypoxia: Secondary | ICD-10-CM | POA: Diagnosis not present

## 2022-06-18 DIAGNOSIS — R6520 Severe sepsis without septic shock: Secondary | ICD-10-CM | POA: Diagnosis not present

## 2022-06-18 DIAGNOSIS — A419 Sepsis, unspecified organism: Secondary | ICD-10-CM | POA: Diagnosis not present

## 2022-06-18 LAB — CBC WITH DIFFERENTIAL/PLATELET
Abs Immature Granulocytes: 0.05 10*3/uL (ref 0.00–0.07)
Basophils Absolute: 0 10*3/uL (ref 0.0–0.1)
Basophils Relative: 1 %
Eosinophils Absolute: 0.1 10*3/uL (ref 0.0–0.5)
Eosinophils Relative: 3 %
HCT: 27.5 % — ABNORMAL LOW (ref 39.0–52.0)
Hemoglobin: 8.7 g/dL — ABNORMAL LOW (ref 13.0–17.0)
Immature Granulocytes: 1 %
Lymphocytes Relative: 20 %
Lymphs Abs: 1 10*3/uL (ref 0.7–4.0)
MCH: 28.2 pg (ref 26.0–34.0)
MCHC: 31.6 g/dL (ref 30.0–36.0)
MCV: 89 fL (ref 80.0–100.0)
Monocytes Absolute: 0.5 10*3/uL (ref 0.1–1.0)
Monocytes Relative: 10 %
Neutro Abs: 3.3 10*3/uL (ref 1.7–7.7)
Neutrophils Relative %: 65 %
Platelets: 238 10*3/uL (ref 150–400)
RBC: 3.09 MIL/uL — ABNORMAL LOW (ref 4.22–5.81)
RDW: 15.3 % (ref 11.5–15.5)
WBC: 5 10*3/uL (ref 4.0–10.5)
nRBC: 0 % (ref 0.0–0.2)

## 2022-06-18 LAB — VANCOMYCIN, RANDOM: Vancomycin Rm: 16 ug/mL

## 2022-06-18 LAB — COMPREHENSIVE METABOLIC PANEL
ALT: 39 U/L (ref 0–44)
AST: 58 U/L — ABNORMAL HIGH (ref 15–41)
Albumin: 2 g/dL — ABNORMAL LOW (ref 3.5–5.0)
Alkaline Phosphatase: 62 U/L (ref 38–126)
Anion gap: 20 — ABNORMAL HIGH (ref 5–15)
BUN: 49 mg/dL — ABNORMAL HIGH (ref 6–20)
CO2: 23 mmol/L (ref 22–32)
Calcium: 9.4 mg/dL (ref 8.9–10.3)
Chloride: 97 mmol/L — ABNORMAL LOW (ref 98–111)
Creatinine, Ser: 5.59 mg/dL — ABNORMAL HIGH (ref 0.61–1.24)
GFR, Estimated: 11 mL/min — ABNORMAL LOW (ref >60)
Glucose, Bld: 86 mg/dL (ref 70–99)
Potassium: 4.4 mmol/L (ref 3.5–5.1)
Sodium: 140 mmol/L (ref 135–145)
Total Bilirubin: 0.6 mg/dL (ref 0.3–1.2)
Total Protein: 6.6 g/dL (ref 6.5–8.1)

## 2022-06-18 LAB — PHOSPHORUS
Phosphorus: 6.8 mg/dL — ABNORMAL HIGH (ref 2.5–4.6)
Phosphorus: 7.1 mg/dL — ABNORMAL HIGH (ref 2.5–4.6)

## 2022-06-18 LAB — MAGNESIUM
Magnesium: 2 mg/dL (ref 1.7–2.4)
Magnesium: 2 mg/dL (ref 1.7–2.4)

## 2022-06-18 LAB — GLUCOSE, CAPILLARY: Glucose-Capillary: 78 mg/dL (ref 70–99)

## 2022-06-18 MED ORDER — PROSOURCE TF20 ENFIT COMPATIBL EN LIQD
60.0000 mL | Freq: Every day | ENTERAL | Status: DC
  Administered 2022-06-18 – 2022-07-05 (×18): 60 mL
  Filled 2022-06-18 (×17): qty 60

## 2022-06-18 MED ORDER — HEPARIN SODIUM (PORCINE) 1000 UNIT/ML IJ SOLN
INTRAMUSCULAR | Status: AC
  Filled 2022-06-18: qty 4

## 2022-06-18 MED ORDER — NEPRO/CARBSTEADY PO LIQD
1000.0000 mL | ORAL | Status: DC
  Administered 2022-06-19 – 2022-06-22 (×5): 1000 mL
  Filled 2022-06-18: qty 1000
  Filled 2022-06-18: qty 1185
  Filled 2022-06-18: qty 1000

## 2022-06-18 NOTE — Plan of Care (Signed)
CHANGE IN DIALYSIS SCHEDULE DUE TO Cody Macdonald  Cody Macdonald 24-Feb-1961 [id number]  Patient dialysis schedule for the week of 06/20/22-06/27/22 varies from their normal schedule due to Thanksgiving Holiday.  Need to keep this in mind for discharge planning.  The Holiday schedule is as follow:   Normal HD Schedule: Monday Wednesday Friday Thanksgiving schedule:Sunday, Tuesday, Friday  They will resume their normal schedule on 06/28/22.     Jen Mow, PA-C [facility] Pager: [phone]

## 2022-06-18 NOTE — Progress Notes (Signed)
Pharmacy Antibiotic Note  Cody Macdonald is a 61 y.o. male admitted on 06/08/2022 with suspected pneumonia.  Pharmacy has been consulted for Vancomycin and Cefepime dosing.  WBC 6.2, Tmax 98.2 Pt still on 4L [place] - weaning today CXR showing improvement ESRD on HD MWF - last HD 11/15 - cefepime and vancomycin given appropriately in HD, planned HD on 11/17, today  Random vancomycin level, pre-HD = 16, therapeutic  Plan: Continue Cefepime 2g IV every MWF with dialysis Continue Vancomycin 500mg  IV every MWF with dialysis Monitor daily CBC, temp, SCr, and for clinical signs of improvement  F/u cultures and de-escalate antibiotics as able   Height: 5\' 8"  (172.7 cm) Weight: 43 kg (94 lb 12.8 oz) IBW/kg (Calculated) : 68.4  Temp (24hrs), Avg:97.8 F (36.6 C), Min:97.2 F (36.2 C), Max:98.2 F (36.8 C)  Recent Labs  Lab 06/12/22 0403 06/12/22 0649 06/14/22 1026 06/16/22 0041 06/17/22 0034 06/18/22 0544  WBC 3.9*  --  3.8* 4.9 6.2  --   CREATININE 5.36*  --   --  5.22* 3.89*  --   LATICACIDVEN 1.5 1.2  --   --   --   --   VANCORANDOM  --   --   --   --   --  16     Estimated Creatinine Clearance: 12.3 mL/min (A) (by C-G formula based on SCr of 3.89 mg/dL (H)).    No Known Allergies  Antimicrobials this admission: Ceftriaxone 11/7 x1, 11/9 >> 11/12 Cefepime 11/8 x1, 11/13 >>  Vancomycin 11/7 x1, 11/13 >>   Dose adjustments this admission: None  Microbiology results: 11/7 BCx x2: NG (final)  11/7 MRSA PCR: not detected 11/13 BCx x2: NGTD x3d  Thank you for allowing pharmacy to be a part of this patient's care.  Luisa Hart, PharmD, BCPS Clinical Pharmacist 06/18/2022 8:33 AM   Please refer to AMION for pharmacy phone number

## 2022-06-18 NOTE — Progress Notes (Signed)
Daily Progress Note   Patient Name: Cody Macdonald       Date: 06/18/2022 DOB: [birth year]  Age: 61 y.o. MRN#: [id number] Attending Physician: Kerney Elbe, DO Primary Care Physician: Pcp, No Admit Date: 06/08/2022  Reason for Consultation/Follow-up: Establishing goals of care  Patient Profile/HPI:  61 y.o. 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 Workup revealed sepsis possibly related pnuemonia, influenza A.  His mental status has waxed and waned during admission and he is with very poor po intake. Palliative medicine consulted for "end of life".     Subjective: Chart reviewed including labs, progress notes, imaging from this and previous encounters.  Evaluated patient.  He continues to be very weak.  He does not answer any of my questions.  He is not eating, drinking or taking medications.  Called his son Cody Macdonald. Discussed cortrak again.  Cody Macdonald would like to do trial of cortrak.   Review of Systems  Unable to perform ROS: Mental status change     Physical Exam Vitals and nursing note reviewed.  Cardiovascular:     Rate and Rhythm: Normal rate.             Vital Signs: BP 133/72 (BP Location: Right Arm)   Pulse 85   Temp (!) 97.3 F (36.3 C) (Axillary)   Resp (!) 21   Ht 5\' 8"  (1.727 m)   Wt 43 kg   SpO2 100%   BMI 14.41 kg/m  SpO2: SpO2: 100 % O2 Device: O2 Device: Nasal Cannula O2 Flow Rate: O2 Flow Rate (L/min): 4 L/min  Intake/output summary:  Intake/Output Summary (Last 24 hours) at 06/18/2022 1444 Last data filed at 06/18/2022 0432 Gross per 24 hour  Intake 531.66 ml  Output --  Net 531.66 ml    LBM: Last BM Date :  06/12/22 Baseline Weight: Weight: 48.1 kg Most recent weight: Weight: 43 kg       Palliative Assessment/Data: PPS: 20%      Patient Active Problem List   Diagnosis Date Noted   Protein-calorie malnutrition, severe 06/12/2022   Influenza A 06/09/2022   Acute respiratory failure with hypoxia ([place]) 06/09/2022   HCAP (healthcare-associated pneumonia) 06/08/2022   Hypertensive  urgency 06/08/2022   Hypertension 04/05/2022   Hyperkalemia 04/05/2022   Pulmonary edema 04/05/2022   Volume overload 04/04/2022   Malnutrition of moderate degree 12/26/2021   ESRD (end stage renal disease) ([place]) 12/24/2021   Symptomatic anemia 12/24/2021   History of stroke 12/24/2021   Tobacco abuse 12/24/2021   Alcohol abuse 12/24/2021   Hypoalbuminemia 12/24/2021   AKI (acute kidney injury) ([place]) 12/24/2021   Abdominal distention 12/24/2021   Leg edema 12/24/2021    Palliative Care Assessment & Plan    Assessment/Recommendations/Plan  Continue current plan of care Order placed for cortrak PMT will f/u on Sunday   Code Status: Full code  Prognosis:  Unable to determine  Discharge Planning: To Be Determined  Care plan was discussed with son and care team.   Thank you for allowing the Palliative Medicine Team to assist in the care of this patient.   Greater than 50%  of this time was spent counseling and coordinating care related to the above assessment and plan.  Mariana Kaufman, AGNP-C Palliative Medicine   Please contact Palliative Medicine Team phone at [phone] for questions and concerns.

## 2022-06-18 NOTE — Progress Notes (Signed)
Pt has been on abx since 11/7. Ok to stop at this point per Dr. Alfredia Ferguson.  Onnie Boer, PharmD, BCIDP, AAHIVP, CPP Infectious Disease Pharmacist 06/18/2022 2:29 PM

## 2022-06-18 NOTE — Progress Notes (Signed)
Nephrology Follow-Up Consult note   Assessment/Recommendations: Cody Macdonald is a/an 61 y.o. male with a past medical history significant for ESRD, admitted for AHRF and influenza.     Subjective: seen in pt's room, no c/o's  Physical Exam: Vitals:   06/12/22 0936 06/12/22 1200  BP: (!) 145/78 131/74  Pulse: 88 79  Resp:  (!) 24  Temp:  98.2 F (36.8 C)  SpO2:  97%  Constitutional: very cachectic and frail and tired appearing, responding w/ groans and nods CV: normal rate, no edema Respiratory: bilateral chest rise, mild iwob Gastrointestinal: soft, non-tender, no palpable masses  Skin: no visible lesions or rashes Ext: no edema bilat  Psych: alert  LUA AVG+bruit/ RIJ TDC  OP HD: MWF at [facility] [address]  3.5h  48kg  2.2/5 bath  300/600  Hep none R AVG/ TDC (AVG ok to use reportedly)   CXR 11/10- bilat splotchy perihilar disease, edema vs infxn, improved compared to 11/8 film  Assessment/ Plan: 1.  Acute hypoxic respiratory failure: due to combination of pulmonary edema/volume overload with bronchospasm along with influenza +/- and probable HCAP.  CXR shows resolution of infiltrates.  2. Altered mental status: per staff at [facility] is not unusual for him not to respond verbally at the OP unit.  3.  ESRD: on HD MWF.  HD today, min UF.  4. HD access: had TDC troubles initially but working now. We were told that the R AVG was ready to be used as well but we haven't been using here.  5.  HTN/ vol: Blood pressure good. Well under dry wt. BP's not tolerating further UF. Min UF next HD.  6.  Secondary hyperparathyroidism: Home binders ordered 7.  Anemia of chronic disease: Hemoglobin 9.  ESA ordered for 06/10/2022.  No iron for now 8.  GOC - pt extremely frail, not interacting very much verbally. Appreciate palliative assistance. Poor prognosis.    [name], MD 06/18/2022, 2:08 PM  Recent Labs  Lab 06/17/22 0034 06/18/22 0544  HGB 9.6* 8.7*  ALBUMIN 2.1* 2.0*   CALCIUM 8.9 9.4  PHOS 4.6 6.8*  CREATININE 3.89* 5.59*  K 3.7 4.4     Inpatient medications:  aspirin  81 mg Oral Daily   Chlorhexidine Gluconate Cloth  6 each Topical Q0600   Chlorhexidine Gluconate Cloth  6 each Topical Q0600   clopidogrel  75 mg Oral Daily   darbepoetin (ARANESP) injection - DIALYSIS  60 mcg Subcutaneous Q Thu-1800   diclofenac Sodium  4 g Topical Once   feeding supplement  237 mL Oral BID BM   heparin  5,000 Units Subcutaneous Q8H   hydrALAZINE  50 mg Oral Q8H   labetalol  300 mg Oral BID   multivitamin  1 tablet Oral QHS   mouth rinse  15 mL Mouth Rinse 4 times per day   pantoprazole  40 mg Oral Daily   sevelamer carbonate  1,600 mg Oral TID WC    sodium chloride 50 mL/hr at 06/18/22 1123   ceFEPime (MAXIPIME) IV Stopped (06/18/22 1205)   vancomycin Stopped (06/16/22 1344)   acetaminophen, acetaminophen, albuterol, docusate sodium, hydrALAZINE, labetalol, mouth rinse, polyethylene glycol

## 2022-06-18 NOTE — Progress Notes (Signed)
PROGRESS NOTE    Cody Macdonald  XJO:[id number] DOB: [birth year] DOA: 06/08/2022 PCP: Merryl Hacker, No   Brief Narrative:  Cody Macdonald is an 61 y.o. male past medical history of stroke, essential hypertension end-stage renal disease on hemodialysis Monday Wednesday and Friday sent from skilled nursing facility due to dyspnea hypertensive urgency and hyperkalemia requiring 10 L high flow nasal cannula in the ED chest x-ray showing pulmonary edema SARS-CoV-2 PCR was negative.  Admitted initially to the ICU nephrology was consulted for emergent dialysis, started on Cleviprex and empiric antibiotic's vancomycin and cefepime.  [facility] assumed care on 06/11/2022.  He continues to get dialysis and oxygen requirement is weaning and he was on 4 L.  Palliative is involved for goals of care discussion and he remains full code as patient's son has not made a decision yet given that he wishes to include few other family members and the shared decision-making.  Patient was little bit more awake today and continue not to talk but did interact a little bit.    Assessment and Plan:  Acute respiratory failure with hypoxia secondary to to influenza A infection r/o Other infection -[place] oxygen to keep sats greater than 90%.  -SpO2: 100 % O2 Flow Rate (L/min): 4 L/min FiO2 (%): (!) 4 % -Plan to wean his oxygen down in the next 24 hours.  -On Droplet Precautions -Procalcitonin Level was >150.00 -Lactic Acid Level went from 1.5 -> 1.2 -Empirically on broad spectrum IV antibiotics with IV Cefepime and IV vancomycin given his continued spiking temperatures.  Today given that he has received 10 days total -C/w Albuterol 2.5 mg q3hprn Wheezing -Chest x-ray done today and showed "Slight prominence of the parahilar interstitial markings, slightly improved, favor resolving mild pulmonary edema.." -Repeat chest x-ray intermittently   Hypertensive Urgency.  -BP parameters have improved.  -C/w Labetalol 300 mg po BID and  Labetalol 10 mg IV q50min PRN High BP -C/w Hydralazine 50 mg po q8h and with IV Hydralazine 10-40 mg q4hprn  -Continue to Monitor BP per Protocol -Last BP is now 133/72   ESRD on HD.  Elevated anion gap Hyperphosphatemia -Further management as per nephrology. -Patient's BUNs/creatinine went from 55/5.36 -> 39/5.22 -> 28/3.89 -> 49/5.59 -Patient's anion gap is now 20, chloride level is 97 and CO2 is 23 -Phos level is now 6.8 -Avoid further nephrotoxic medications, contrast dyes, hypotension and dehydration to ensure adequate renal perfusion -Repeat CMP in a.m.   Poor p.o. intake -Has been refusing to eat and not really eating very much -Start gentle IV fluid hydration with normal saline at 50 MLS per hour for 36 hours -Consult nutrition for further evaluation recommendations and Family ok with Trial of Cortrak and now going to be placed.    H/o CVA -On Aspirin and Clopidogrel , on dysphagia  1 diet.    Anemia of Chronic Kidney Disease  -Stable -Hemoglobin/hematocrit went from 9.3/29.1 -> 8.5/26.6 -> 9.4/29.0 and is now 9.6/30.2 -> 8.7/27.5 -Check Anemia Panel in the AM  -We will continue to monitor for signs and symptoms of bleeding; no overt bleeding noted -Repeat CBC in a.m.   Hypoalbuminemia -Patient albumin level was 2.0 on last check -Continue monitor and trend and repeat CMP in the AM   Acute Metabolic Encephalopathy -Probably sec to influenza A infection, improving mental status today. More alert and shaking head yes and no to questions but did not respond to any of my questioning -Speech evaluated the patient recommended dysphagia 1 diet. -Changed antibiotics  to IV Vanco and cefepime given that he continued to spike fevers but has received 10 days of Abx and will stop  -He is awake and does answer questions while shaking his head yes and no but does not really respond after that   Abnormal LFTs -Patient's AST was 312 -> 79 -> 58  and AST went from 97 -> 47 ->  39 -Continue to monitor and trend and repeat CMP in the a.m. if necessary will obtain a right upper quadrant ultrasound as well as a acute hepatitis panel -Repeat CMP in a.m.   Underweight/ Severe Malnutrition in the Context of Chronic Illness/cachexia -Nutrition Status: Nutrition Problem: Severe Malnutrition Etiology: chronic illness (ESRD on HD) Signs/Symptoms: severe muscle depletion, severe fat depletion Interventions: Refer to RD note for recommendations -Estimated body mass index is 14.51 kg/m as calculated from the following:   Height as of this encounter: 5\' 8"  (1.727 m).   Weight as of this encounter: 43.3 kg. -Plan is to trial a Cortrak and initiate Nepro mL per hour with 10 mL advancement every 24 hours for goal of 40 MLS per hour plus PS x1 and continuing dysphagia 1 diet and Ensure twice daily  DVT prophylaxis: heparin injection 5,000 Units Start: 06/08/22 2200 SCDs Start: 06/08/22 1543    Code Status: Full Code Family Communication: No family present at bedside  Disposition Plan:  Level of care: Progressive Status is: Inpatient Remains inpatient appropriate because: Needs further clinical improvement and tolerance of a diet and core track is going to be placed for trial of tube feeding   Consultants:  Palliative care medicine Nephrology  Procedures:  As delineated as above  Antimicrobials:  Anti-infectives (From admission, onward)    Start     Dose/Rate Route Frequency Ordered Stop   06/15/22 0900  ceFEPIme (MAXIPIME) 2 g in sodium chloride 0.9 % 100 mL IVPB        2 g 200 mL/hr over 30 Minutes Intravenous  Once 06/15/22 0804 06/15/22 1044   06/14/22 1200  ceFEPIme (MAXIPIME) 2 g in sodium chloride 0.9 % 100 mL IVPB  Status:  Discontinued        2 g 200 mL/hr over 30 Minutes Intravenous Every M-W-F (Hemodialysis) 06/14/22 1008 06/18/22 1429   06/14/22 1200  vancomycin (VANCOREADY) IVPB 500 mg/100 mL  Status:  Discontinued        500 mg 100 mL/hr over 60  Minutes Intravenous Every M-W-F (Hemodialysis) 06/14/22 1008 06/18/22 1429   06/14/22 1100  vancomycin (VANCOCIN) IVPB 1000 mg/200 mL premix        1,000 mg 200 mL/hr over 60 Minutes Intravenous  Once 06/14/22 1000 06/14/22 1600   06/14/22 1100  ceFEPIme (MAXIPIME) 2 g in sodium chloride 0.9 % 100 mL IVPB        2 g 200 mL/hr over 30 Minutes Intravenous  Once 06/14/22 1000 06/14/22 1259   06/12/22 2000  oseltamivir (TAMIFLU) capsule 30 mg        30 mg Oral  Once 06/12/22 1440 06/12/22 2149   06/10/22 2100  oseltamivir (TAMIFLU) capsule 30 mg        30 mg Oral  Once 06/10/22 0825 06/11/22 0517   06/10/22 1030  cefTRIAXone (ROCEPHIN) 2 g in sodium chloride 0.9 % 100 mL IVPB  Status:  Discontinued        2 g 200 mL/hr over 30 Minutes Intravenous Every 24 hours 06/10/22 0945 06/14/22 1000   06/09/22 2200  ceFEPIme (MAXIPIME) 1 g in  sodium chloride 0.9 % 100 mL IVPB  Status:  Discontinued        1 g 200 mL/hr over 30 Minutes Intravenous Every 24 hours 06/08/22 1611 06/10/22 0945   06/09/22 1015  oseltamivir (TAMIFLU) capsule 30 mg        30 mg Oral  Once 06/09/22 0920 06/09/22 1045   06/08/22 1830  oseltamivir (TAMIFLU) capsule 30 mg  Status:  Discontinued        30 mg Oral  Once 06/08/22 1818 06/09/22 0920   06/08/22 1612  vancomycin variable dose per unstable renal function (pharmacist dosing)  Status:  Discontinued         Does not apply See admin instructions 06/08/22 1612 06/09/22 0920   06/08/22 1100  vancomycin (VANCOCIN) IVPB 1000 mg/200 mL premix        1,000 mg 200 mL/hr over 60 Minutes Intravenous  Once 06/08/22 1057 06/08/22 1329   06/08/22 1100  ceFEPIme (MAXIPIME) 2 g in sodium chloride 0.9 % 100 mL IVPB        2 g 200 mL/hr over 30 Minutes Intravenous  Once 06/08/22 1057 06/08/22 1225       Subjective: Seen and examined at bedside and he continues to be intermittently awake and does not really respond to me again.  Will look at me and answer some questions by shaking his  head yes and no.  No other concerns or points this time.  Appetite is very poor so after further goals of care discussion with the patient's son by palliative care we will trial a cortrak with tube feeding.  We will stop antibiotics today.  No other concerns or complaints at this time.  Objective: Vitals:   06/18/22 0029 06/18/22 0511 06/18/22 0717 06/18/22 1108  BP: 134/70 137/73 134/73 133/72  Pulse: 88 86 86 85  Resp: (!) 21 16 18  (!) 21  Temp: 97.9 F (36.6 C) 97.6 F (36.4 C) (!) 97.2 F (36.2 C) (!) 97.3 F (36.3 C)  TempSrc: Axillary Axillary Axillary Axillary  SpO2: 100%  100% 100%  Weight:  43 kg    Height:        Intake/Output Summary (Last 24 hours) at 06/18/2022 1650 Last data filed at 06/18/2022 0432 Gross per 24 hour  Intake 531.66 ml  Output --  Net 531.66 ml   Filed Weights   06/16/22 1223 06/17/22 0417 06/18/22 0511  Weight: 44 kg 43.3 kg 43 kg   Examination: Physical Exam:  Constitutional: Thin and very cachectic African-American male in no acute distress Respiratory: Diminished to auscultation bilaterally with coarse breath sounds, no wheezing, rales, rhonchi or crackles. Normal respiratory effort and patient is not tachypenic. No accessory muscle use.  Unlabored breathing and wearing supplemental oxygen via nasal cannula Cardiovascular: RRR, no murmurs / rubs / gallops. S1 and S2 auscultated. No extremity edema.  Abdomen: Soft, non-tender, non-distended. Bowel sounds positive.  GU: Deferred. Musculoskeletal: No clubbing / cyanosis of digits/nails. No joint deformity upper and lower extremities.  Neurologic: Does not really display neurological examination and does not answer but the nods head yes and no to some questions Psychiatric: Impaired judgment and insight  Data Reviewed: I have personally reviewed following labs and imaging studies  CBC: Recent Labs  Lab 06/12/22 0403 06/14/22 1026 06/16/22 0041 06/17/22 0034 06/18/22 0544  WBC 3.9* 3.8*  4.9 6.2 5.0  NEUTROABS 3.0 2.3 3.1 4.6 3.3  HGB 9.3* 8.5* 9.4* 9.6* 8.7*  HCT 29.1* 26.6* 29.0* 30.2* 27.5*  MCV  89.5 89.3 89.0 89.1 89.0  PLT 174 201 236 215 858   Basic Metabolic Panel: Recent Labs  Lab 06/12/22 0403 06/16/22 0041 06/17/22 0034 06/18/22 0544  NA 132* 136 135 140  K 4.4 3.9 3.7 4.4  CL 94* 95* 96* 97*  CO2 23 25 22 23   GLUCOSE 80 87 66* 86  BUN 55* 39* 28* 49*  CREATININE 5.36* 5.22* 3.89* 5.59*  CALCIUM 7.8* 8.6* 8.9 9.4  MG 1.8  --  1.7 2.0  PHOS  --   --  4.6 6.8*   GFR: Estimated Creatinine Clearance: 8.5 mL/min (A) (by C-G formula based on SCr of 5.59 mg/dL (H)). Liver Function Tests: Recent Labs  Lab 06/12/22 0403 06/17/22 0034 06/18/22 0544  AST 312* 79* 58*  ALT 97* 47* 39  ALKPHOS 57 66 62  BILITOT 0.3 1.2 0.6  PROT 6.5 7.1 6.6  ALBUMIN 2.7* 2.1* 2.0*   No results for input(s): "LIPASE", "AMYLASE" in the last 168 hours. No results for input(s): "AMMONIA" in the last 168 hours. Coagulation Profile: No results for input(s): "INR", "PROTIME" in the last 168 hours. Cardiac Enzymes: No results for input(s): "CKTOTAL", "CKMB", "CKMBINDEX", "TROPONINI" in the last 168 hours. BNP (last 3 results) No results for input(s): "PROBNP" in the last 8760 hours. HbA1C: No results for input(s): "HGBA1C" in the last 72 hours. CBG: Recent Labs  Lab 06/11/22 2048 06/13/22 0227 06/18/22 1151  GLUCAP 89 86 78   Lipid Profile: No results for input(s): "CHOL", "HDL", "LDLCALC", "TRIG", "CHOLHDL", "LDLDIRECT" in the last 72 hours. Thyroid Function Tests: No results for input(s): "TSH", "T4TOTAL", "FREET4", "T3FREE", "THYROIDAB" in the last 72 hours. Anemia Panel: No results for input(s): "VITAMINB12", "FOLATE", "FERRITIN", "TIBC", "IRON", "RETICCTPCT" in the last 72 hours. Sepsis Labs: Recent Labs  Lab 06/12/22 0026 06/12/22 0403 06/12/22 0649  PROCALCITON >150.00  --   --   LATICACIDVEN  --  1.5 1.2    Recent Results (from the past 240  hour(s))  MRSA Next Gen by PCR, Nasal     Status: None   Collection Time: 06/08/22  7:01 PM   Specimen: Nasal Mucosa; Nasal Swab  Result Value Ref Range Status   MRSA by PCR Next Gen NOT DETECTED NOT DETECTED Final    Comment: (NOTE) The GeneXpert MRSA Assay (FDA approved for NASAL specimens only), is one component of a comprehensive MRSA colonization surveillance program. It is not intended to diagnose MRSA infection nor to guide or monitor treatment for MRSA infections. Test performance is not FDA approved in patients less than 82 years old. Performed at [facility], [place] [address], [place], [place] [address]   Culture, blood (Routine X 2) w Reflex to ID Panel     Status: None (Preliminary result)   Collection Time: 06/14/22 10:26 AM   Specimen: BLOOD  Result Value Ref Range Status   Specimen Description   Final    BLOOD BLOOD RIGHT ARM AEROBIC BOTTLE ONLY ANAEROBIC BOTTLE ONLY   Special Requests   Final    BOTTLES DRAWN AEROBIC AND ANAEROBIC Blood Culture adequate volume   Culture   Final    NO GROWTH 4 DAYS Performed at [facility], [place] [address] [place], [place] [address]    Report Status PENDING  Incomplete  Culture, blood (Routine X 2) w Reflex to ID Panel     Status: None (Preliminary result)   Collection Time: 06/14/22 10:26 AM   Specimen: BLOOD RIGHT ARM  Result Value  Ref Range Status   Specimen Description   Final    BLOOD RIGHT ARM AEROBIC BOTTLE ONLY ANAEROBIC BOTTLE ONLY   Special Requests   Final    BOTTLES DRAWN AEROBIC AND ANAEROBIC Blood Culture adequate volume   Culture   Final    NO GROWTH 4 DAYS Performed at [facility], [address], [place],  [address]    Report Status PENDING  Incomplete    Radiology Studies: DG Abd Portable 1V  Result Date: 06/18/2022 CLINICAL DATA:  NG tube placement EXAM: PORTABLE ABDOMEN - 1 VIEW COMPARISON:  [facility] 12/24/2021, CT abdomen/pelvis 05/26/2022 FINDINGS: The enteric catheter tip is in  the distal stomach. There is enteric contrast in the colon. There is a nonobstructive bowel gas pattern. There is no definite free intraperitoneal air. There is no acute osseous abnormality. IMPRESSION: 1. Enteric catheter tip in the stomach. 2. Enteric contrast in the colon.  Nonobstructive bowel gas pattern. Electronically Signed   By: Valetta Mole M.D.   On: 06/18/2022 15:58   DG CHEST PORT 1 VIEW  Result Date: 06/18/2022 CLINICAL DATA:  [id number] SOB (shortness of breath) [id number] EXAM: PORTABLE CHEST 1 VIEW COMPARISON:  06/16/2022 chest radiograph. FINDINGS: Right internal jugular central venous catheter terminates in the middle third of the SVC. Surgical clips overlie the medial lower left chest. Stable cardiomediastinal silhouette with normal heart size. No pneumothorax. No pleural effusion. Slight prominence of the parahilar interstitial markings, slightly improved. IMPRESSION: Slight prominence of the parahilar interstitial markings, slightly improved, favor resolving mild pulmonary edema. Electronically Signed   By: Ilona Sorrel M.D.   On: 06/18/2022 08:19    Scheduled Meds:  aspirin  81 mg Oral Daily   Chlorhexidine Gluconate Cloth  6 each Topical Q0600   Chlorhexidine Gluconate Cloth  6 each Topical Q0600   clopidogrel  75 mg Oral Daily   darbepoetin (ARANESP) injection - DIALYSIS  60 mcg Subcutaneous Q Thu-1800   diclofenac Sodium  4 g Topical Once   feeding supplement  237 mL Oral BID BM   feeding supplement (PROSource TF20)  60 mL Per Tube Daily   heparin  5,000 Units Subcutaneous Q8H   hydrALAZINE  50 mg Oral Q8H   labetalol  300 mg Oral BID   multivitamin  1 tablet Oral QHS   mouth rinse  15 mL Mouth Rinse 4 times per day   pantoprazole  40 mg Oral Daily   sevelamer carbonate  1,600 mg Oral TID WC   Continuous Infusions:  sodium chloride 50 mL/hr at 06/18/22 1123   feeding supplement (NEPRO CARB STEADY)      LOS: 10 days   Raiford Noble, DO [facility] Available via  Epic secure chat 7am-7pm After these hours, please refer to coverage provider listed on [email or web address] 06/18/2022, 4:50 PM

## 2022-06-18 NOTE — Progress Notes (Signed)
Pt to hemodialysis transported via bed.

## 2022-06-18 NOTE — Progress Notes (Addendum)
Nutrition Follow-up  DOCUMENTATION CODES:   Underweight, Severe malnutrition in context of chronic illness  INTERVENTION:  - Initiate Nepro at 16mL/hr with 63mL Q24H advancement to Goal of 84mL/hr + PS x1 = 1780 kcal, 100g protein, 667mL free water   - Continue Dys 1 diet.   - Continue Ensure BID.   NUTRITION DIAGNOSIS:   Severe Malnutrition related to chronic illness (ESRD on HD) as evidenced by severe muscle depletion, severe fat depletion.  GOAL:   Patient will meet greater than or equal to 90% of their needs   MONITOR:   Diet advancement, Weight trends, Supplement acceptance, Labs  REASON FOR ASSESSMENT:   Consult Assessment of nutrition requirement/status  ASSESSMENT:   Pt with hx of ESRD on HD, hx CVA, HTN, and hx of EtOH and tobacco abuse presented to ED from SNF with SOB, hyperkalemia, and in HTN crisis.  Meds include:  Rena-vit. Labs reviewed.   MD consult for assessment. RD team already following pt. Pt already receiving supplements. Pt's PO intakes remain poor. Previous RD note recommended Cortrak tube placement. Cortrak tube placement still remains best option for now as pt continues with poor oral intakes and remains a full code. RN reports that the pt is pocketing his food and not following instructions when it comes to swallowing.   RD messaged MD in regards to Cortrak but no response at this time.   ADDENDUM:   Consult for Cortrak placed. RD will order feeds to initiate and advance slowly due to risk of refeeding. Will continue to monitor closely.   Diet Order:   Diet Order             DIET - DYS 1 Room service appropriate? Yes; Fluid consistency: Thin  Diet effective now                   EDUCATION NEEDS:   Not appropriate for education at this time  Skin:  Skin Assessment: Reviewed RN Assessment  Last BM:  06/13/22  Height:   Ht Readings from Last 1 Encounters:  06/09/22 5\' 8"  (1.727 m)    Weight:   Wt Readings from Last 1  Encounters:  06/18/22 43 kg    Ideal Body Weight:  70 kg  BMI:  Body mass index is 14.41 kg/m.  Estimated Nutritional Needs:   Kcal:  1700-1900 kcal/d  Protein:  80-100 g/d  Fluid:  1L+UOP  Verle Wheeling Graciela Husbands, RD, LDN, CNSC

## 2022-06-18 NOTE — Procedures (Signed)
Cortrak  Person Inserting Tube:  Ranell Patrick D, RD Tube Type:  Cortrak - 43 inches Tube Size:  10 Tube Location:  Left nare Secured by: Bridle Technique Used to Measure Tube Placement:  Marking at nare/corner of mouth Cortrak Secured At:  72 cm Procedure Comments:  Cortrak Tube Team Note:  Consult received to place a Cortrak feeding tube.   X-ray is required, abdominal x-ray has been ordered by the Cortrak team. Please confirm tube placement before using the Cortrak tube.   If the tube becomes dislodged please keep the tube and contact the Cortrak team at [email or web address] for replacement.  If after hours and replacement cannot be delayed, place a NG tube and confirm placement with an abdominal x-ray.    Ranell Patrick, RD, LDN Clinical Dietitian RD pager # available in [place]  After hours/weekend pager # available in [facility]

## 2022-06-19 DIAGNOSIS — J189 Pneumonia, unspecified organism: Secondary | ICD-10-CM | POA: Diagnosis not present

## 2022-06-19 DIAGNOSIS — J101 Influenza due to other identified influenza virus with other respiratory manifestations: Secondary | ICD-10-CM | POA: Diagnosis not present

## 2022-06-19 DIAGNOSIS — I160 Hypertensive urgency: Secondary | ICD-10-CM | POA: Diagnosis not present

## 2022-06-19 DIAGNOSIS — J9601 Acute respiratory failure with hypoxia: Secondary | ICD-10-CM | POA: Diagnosis not present

## 2022-06-19 LAB — CBC WITH DIFFERENTIAL/PLATELET
Abs Immature Granulocytes: 0.04 10*3/uL (ref 0.00–0.07)
Basophils Absolute: 0 10*3/uL (ref 0.0–0.1)
Basophils Relative: 1 %
Eosinophils Absolute: 0.1 10*3/uL (ref 0.0–0.5)
Eosinophils Relative: 3 %
HCT: 25.7 % — ABNORMAL LOW (ref 39.0–52.0)
Hemoglobin: 8.2 g/dL — ABNORMAL LOW (ref 13.0–17.0)
Immature Granulocytes: 1 %
Lymphocytes Relative: 17 %
Lymphs Abs: 0.7 10*3/uL (ref 0.7–4.0)
MCH: 28.4 pg (ref 26.0–34.0)
MCHC: 31.9 g/dL (ref 30.0–36.0)
MCV: 88.9 fL (ref 80.0–100.0)
Monocytes Absolute: 0.4 10*3/uL (ref 0.1–1.0)
Monocytes Relative: 9 %
Neutro Abs: 3.1 10*3/uL (ref 1.7–7.7)
Neutrophils Relative %: 69 %
Platelets: 203 10*3/uL (ref 150–400)
RBC: 2.89 MIL/uL — ABNORMAL LOW (ref 4.22–5.81)
RDW: 15.1 % (ref 11.5–15.5)
WBC: 4.3 10*3/uL (ref 4.0–10.5)
nRBC: 0 % (ref 0.0–0.2)

## 2022-06-19 LAB — GLUCOSE, CAPILLARY
Glucose-Capillary: 72 mg/dL (ref 70–99)
Glucose-Capillary: 77 mg/dL (ref 70–99)
Glucose-Capillary: 79 mg/dL (ref 70–99)
Glucose-Capillary: 83 mg/dL (ref 70–99)
Glucose-Capillary: 88 mg/dL (ref 70–99)
Glucose-Capillary: 95 mg/dL (ref 70–99)

## 2022-06-19 LAB — COMPREHENSIVE METABOLIC PANEL
ALT: 35 U/L (ref 0–44)
AST: 52 U/L — ABNORMAL HIGH (ref 15–41)
Albumin: 1.9 g/dL — ABNORMAL LOW (ref 3.5–5.0)
Alkaline Phosphatase: 63 U/L (ref 38–126)
Anion gap: 14 (ref 5–15)
BUN: 31 mg/dL — ABNORMAL HIGH (ref 6–20)
CO2: 25 mmol/L (ref 22–32)
Calcium: 8.9 mg/dL (ref 8.9–10.3)
Chloride: 99 mmol/L (ref 98–111)
Creatinine, Ser: 3.62 mg/dL — ABNORMAL HIGH (ref 0.61–1.24)
GFR, Estimated: 18 mL/min — ABNORMAL LOW (ref >60)
Glucose, Bld: 83 mg/dL (ref 70–99)
Potassium: 3.5 mmol/L (ref 3.5–5.1)
Sodium: 138 mmol/L (ref 135–145)
Total Bilirubin: 0.6 mg/dL (ref 0.3–1.2)
Total Protein: 6.3 g/dL — ABNORMAL LOW (ref 6.5–8.1)

## 2022-06-19 LAB — PHOSPHORUS
Phosphorus: 3.4 mg/dL (ref 2.5–4.6)
Phosphorus: 3.8 mg/dL (ref 2.5–4.6)

## 2022-06-19 LAB — CULTURE, BLOOD (ROUTINE X 2)
Culture: NO GROWTH
Culture: NO GROWTH
Special Requests: ADEQUATE
Special Requests: ADEQUATE

## 2022-06-19 LAB — MAGNESIUM
Magnesium: 1.7 mg/dL (ref 1.7–2.4)
Magnesium: 1.6 mg/dL — ABNORMAL LOW (ref 1.7–2.4)

## 2022-06-19 MED ORDER — CHLORHEXIDINE GLUCONATE CLOTH 2 % EX PADS
6.0000 | MEDICATED_PAD | Freq: Every day | CUTANEOUS | Status: DC
  Administered 2022-06-20 – 2022-06-30 (×11): 6 via TOPICAL

## 2022-06-19 MED ORDER — CLOPIDOGREL BISULFATE 75 MG PO TABS
75.0000 mg | ORAL_TABLET | Freq: Every day | ORAL | Status: DC
  Administered 2022-06-19 – 2022-06-28 (×10): 75 mg
  Filled 2022-06-19 (×10): qty 1

## 2022-06-19 MED ORDER — ACETAMINOPHEN 325 MG PO TABS
650.0000 mg | ORAL_TABLET | Freq: Four times a day (QID) | ORAL | Status: DC | PRN
  Administered 2022-06-19 – 2022-07-06 (×15): 650 mg
  Filled 2022-06-19 (×17): qty 2

## 2022-06-19 MED ORDER — HYDRALAZINE HCL 50 MG PO TABS
50.0000 mg | ORAL_TABLET | Freq: Three times a day (TID) | ORAL | Status: DC
  Administered 2022-06-19 – 2022-06-26 (×18): 50 mg
  Filled 2022-06-19 (×21): qty 1

## 2022-06-19 MED ORDER — SEVELAMER CARBONATE 800 MG PO TABS
1600.0000 mg | ORAL_TABLET | Freq: Three times a day (TID) | ORAL | Status: DC
  Administered 2022-06-19 – 2022-06-20 (×5): 1600 mg
  Filled 2022-06-19 (×5): qty 2

## 2022-06-19 MED ORDER — ASPIRIN 81 MG PO CHEW
81.0000 mg | CHEWABLE_TABLET | Freq: Every day | ORAL | Status: DC
  Administered 2022-06-19 – 2022-06-29 (×11): 81 mg
  Filled 2022-06-19 (×11): qty 1

## 2022-06-19 MED ORDER — ENSURE ENLIVE PO LIQD: 237.0000 mL | Freq: Two times a day (BID) | ORAL | Status: DC

## 2022-06-19 MED ORDER — POLYETHYLENE GLYCOL 3350 17 G PO PACK
17.0000 g | PACK | Freq: Every day | ORAL | Status: DC | PRN
  Administered 2022-06-23: 17 g
  Filled 2022-06-19 (×2): qty 1

## 2022-06-19 MED ORDER — RENA-VITE PO TABS
1.0000 | ORAL_TABLET | Freq: Every day | ORAL | Status: DC
  Administered 2022-06-19 – 2022-07-06 (×18): 1
  Filled 2022-06-19 (×18): qty 1

## 2022-06-19 MED ORDER — LABETALOL HCL 200 MG PO TABS
300.0000 mg | ORAL_TABLET | Freq: Two times a day (BID) | ORAL | Status: DC
  Administered 2022-06-19 – 2022-06-26 (×14): 300 mg
  Filled 2022-06-19 (×15): qty 1

## 2022-06-19 NOTE — Progress Notes (Signed)
PROGRESS NOTE    Cody Macdonald  TIR:[id number] DOB: 20-Apr-1961 DOA: 06/08/2022 PCP: Merryl Hacker, No   Brief Narrative:  Cody Macdonald is an 61 y.o. male past medical history of stroke, essential hypertension end-stage renal disease on hemodialysis Monday Wednesday and Friday sent from skilled nursing facility due to dyspnea hypertensive urgency and hyperkalemia requiring 10 L high flow nasal cannula in the ED chest x-ray showing pulmonary edema SARS-CoV-2 PCR was negative.  Admitted initially to the ICU nephrology was consulted for emergent dialysis, started on Cleviprex and empiric antibiotic's vancomycin and cefepime.  [facility] assumed care on 06/11/2022.  He continues to get dialysis and oxygen requirement is weaning and he was on 4 L.  Palliative is involved for goals of care discussion and he remains full code as patient's son has not made a decision yet given that he wishes to include few other family members and the shared decision-making.  Patient's son was agreeable to a Cortrak so this has been initiated and he is getting tube feedings at 10 MLS per hour and will advance by 10 mL to goal rate of 40 mL/h.  Respiratory status is about the same and patient continued to be withdrawn.   Assessment and Plan:  Acute respiratory failure with hypoxia secondary to to influenza A infection r/o Other infection -[place] oxygen to keep sats greater than 90%.  -SpO2: 98 % O2 Flow Rate (L/min): 2 L/min FiO2 (%): (!) 4 % -Plan to wean his oxygen down in the next 24 hours.  -On Droplet Precautions -Procalcitonin Level was >150.00 -Lactic Acid Level went from 1.5 -> 1.2 -Blood cultures x2 on 06/14/2022 showed no growth to date 5 days -Empirically on broad spectrum IV antibiotics with IV Cefepime and IV vancomycin given his continued spiking temperatures.  Discontinued yesterday given that he has received 10 days total but still spiked a temperature and had Tmax of 100.6 in the last 24 hours; if continues to have  temperatures may need to reinitiate antibiotics and may need to have ID assistance -C/w Albuterol 2.5 mg q3hprn Wheezing -Chest x-ray done yesterday and showed "Slight prominence of the parahilar interstitial markings, slightly improved, favor resolving mild pulmonary edema.." -Repeat chest x-ray intermittently and will repeat in the morning   Hypertensive Urgency.  -BP parameters have improved.  -C/w Labetalol 300 mg po BID and Labetalol 10 mg IV q98min PRN High BP -C/w Hydralazine 50 mg po q8h and with IV Hydralazine 10-40 mg q4hprn  -Continue to Monitor BP per Protocol -Last BP is now 110/56   ESRD on HD.  Elevated anion gap Hyperphosphatemia -Further management as per nephrology. -Patient's BUNs/creatinine went from 55/5.36 -> 39/5.22 -> 28/3.89 -> 49/5.59 and is now 31/3.62 -Patient's anion gap is now 14, chloride level is 99 and CO2 is 25 -Phos level is now 6.8 yesterday and then trended up to 7.1 but is now 3.4 -Avoid further nephrotoxic medications, contrast dyes, hypotension and dehydration to ensure adequate renal perfusion -Next scheduled dialysis session is tomorrow 1119 -Repeat CMP in a.m.   Poor p.o. intake -Has been refusing to eat and not really eating very much -Start gentle IV fluid hydration with normal saline at 50 MLS per hour for 36 hours -Consult nutrition for further evaluation recommendations and Family ok with Trial of Cortrak and now going to be placed.;  See below   H/o CVA -On Aspirin and Clopidogrel , on dysphagia  1 diet.    Anemia of Chronic Kidney Disease  -Stable -Hemoglobin/hematocrit  went from 9.3/29.1 -> 8.5/26.6 -> 9.4/29.0 and is now 9.6/30.2 -> 8.7/27.5 and dropped to 8.2/25.7 -Check Anemia Panel in the AM  -We will continue to monitor for signs and symptoms of bleeding; no overt bleeding noted -Repeat CBC in a.m.   Hypoalbuminemia -Patient albumin level was 1.9 on last check -Continue monitor and trend and repeat CMP in the AM   Acute  Metabolic Encephalopathy -Probably sec to influenza A infection, improving mental status today. More alert and shaking head yes and no to questions but did not respond to any of my questioning -Speech evaluated the patient recommended dysphagia 1 diet. -Changed antibiotics to IV Vanco and cefepime given that he continued to spike fevers but has received 10 days of Abx and will stop  -He is awake and but did not answer any questions shaking his head yes or no and he just stared at me most of the time   Abnormal LFTs -Patient's AST was 312 -> 79 -> 58 and is now 52 and AST went from 97 -> 47 -> 39 and is now 35 -Continue to monitor and trend and repeat CMP in the a.m. if necessary will obtain a right upper quadrant ultrasound as well as a acute hepatitis panel -Repeat CMP in a.m.   Underweight/ Severe Malnutrition in the Context of Chronic Illness/cachexia -Nutrition Status: Nutrition Problem: Severe Malnutrition Etiology: chronic illness (ESRD on HD) Signs/Symptoms: severe muscle depletion, severe fat depletion Interventions: Refer to RD note for recommendations -Estimated body mass index is 14.51 kg/m as calculated from the following:   Height as of this encounter: 5\' 8"  (1.727 m).   Weight as of this encounter: 43.3 kg. -Plan is to trial a Cortrak and initiate Nepro mL per hour with 10 mL advancement every 24 hours for goal of 40 MLS per hour plus PS x1 and continuing dysphagia 1 diet and Ensure twice daily; Cortrak was placed yesterday  DVT prophylaxis: heparin injection 5,000 Units Start: 06/08/22 2200 SCDs Start: 06/08/22 1543    Code Status: Full Code Family Communication: No family currently at bedside  Disposition Plan:  Level of care: Progressive Status is: Inpatient Remains inpatient appropriate because: Needs further goals of care discussion and will be dialyzed again tomorrow.  Now has a small bore feeding tube and getting tube feedings through this   Consultants:   Palliative care medicine Nephrology  Procedures:  As delineated as above  Antimicrobials:  Anti-infectives (From admission, onward)    Start     Dose/Rate Route Frequency Ordered Stop   06/15/22 0900  ceFEPIme (MAXIPIME) 2 g in sodium chloride 0.9 % 100 mL IVPB        2 g 200 mL/hr over 30 Minutes Intravenous  Once 06/15/22 0804 06/15/22 1044   06/14/22 1200  ceFEPIme (MAXIPIME) 2 g in sodium chloride 0.9 % 100 mL IVPB  Status:  Discontinued        2 g 200 mL/hr over 30 Minutes Intravenous Every M-W-F (Hemodialysis) 06/14/22 1008 06/18/22 1429   06/14/22 1200  vancomycin (VANCOREADY) IVPB 500 mg/100 mL  Status:  Discontinued        500 mg 100 mL/hr over 60 Minutes Intravenous Every M-W-F (Hemodialysis) 06/14/22 1008 06/18/22 1429   06/14/22 1100  vancomycin (VANCOCIN) IVPB 1000 mg/200 mL premix        1,000 mg 200 mL/hr over 60 Minutes Intravenous  Once 06/14/22 1000 06/14/22 1600   06/14/22 1100  ceFEPIme (MAXIPIME) 2 g in sodium chloride 0.9 % 100  mL IVPB        2 g 200 mL/hr over 30 Minutes Intravenous  Once 06/14/22 1000 06/14/22 1259   06/12/22 2000  oseltamivir (TAMIFLU) capsule 30 mg        30 mg Oral  Once 06/12/22 1440 06/12/22 2149   06/10/22 2100  oseltamivir (TAMIFLU) capsule 30 mg        30 mg Oral  Once 06/10/22 0825 06/11/22 0517   06/10/22 1030  cefTRIAXone (ROCEPHIN) 2 g in sodium chloride 0.9 % 100 mL IVPB  Status:  Discontinued        2 g 200 mL/hr over 30 Minutes Intravenous Every 24 hours 06/10/22 0945 06/14/22 1000   06/09/22 2200  ceFEPIme (MAXIPIME) 1 g in sodium chloride 0.9 % 100 mL IVPB  Status:  Discontinued        1 g 200 mL/hr over 30 Minutes Intravenous Every 24 hours 06/08/22 1611 06/10/22 0945   06/09/22 1015  oseltamivir (TAMIFLU) capsule 30 mg        30 mg Oral  Once 06/09/22 0920 06/09/22 1045   06/08/22 1830  oseltamivir (TAMIFLU) capsule 30 mg  Status:  Discontinued        30 mg Oral  Once 06/08/22 1818 06/09/22 0920   06/08/22 1612   vancomycin variable dose per unstable renal function (pharmacist dosing)  Status:  Discontinued         Does not apply See admin instructions 06/08/22 1612 06/09/22 0920   06/08/22 1100  vancomycin (VANCOCIN) IVPB 1000 mg/200 mL premix        1,000 mg 200 mL/hr over 60 Minutes Intravenous  Once 06/08/22 1057 06/08/22 1329   06/08/22 1100  ceFEPIme (MAXIPIME) 2 g in sodium chloride 0.9 % 100 mL IVPB        2 g 200 mL/hr over 30 Minutes Intravenous  Once 06/08/22 1057 06/08/22 1225       Subjective: Seen and examined at bedside and he would not respond to me again and did not even acknowledge he by shaking his head yes now.  He would just look at me without saying a word and was tracking with his eyes.  No family currently at bedside.  Appetite is very poor and we will continue to have goals of care discussion with the patient's son but he has a small bore feeding tube now and will continue this and advance to 40 mils per hour.  Antibiotics were stopped yesterday however he did spike a temperature of 100.6 overnight which we will need to continue monitor.  No other concerns or complaints at this time.  Objective: Vitals:   06/19/22 0050 06/19/22 0413 06/19/22 0827 06/19/22 1239  BP: (!) 142/70 101/61 111/68 (!) 110/56  Pulse: 93 90 87 81  Resp: 19 20 (!) 22 18  Temp: 97.8 F (36.6 C) 97.9 F (36.6 C) 98.7 F (37.1 C) 97.8 F (36.6 C)  TempSrc: Oral Oral Oral Oral  SpO2: 100% 99% 100% 98%  Weight:  44.2 kg    Height:        Intake/Output Summary (Last 24 hours) at 06/19/2022 1527 Last data filed at 06/19/2022 0600 Gross per 24 hour  Intake 60 ml  Output 0 ml  Net 60 ml   Filed Weights   06/18/22 2000 06/19/22 0000 06/19/22 0413  Weight: 44.9 kg 44.9 kg 44.2 kg   Examination: Physical Exam:  Constitutional: Thin and very cachectic African-American male in no acute distress Respiratory: Diminished to auscultation  bilaterally with coarse breath sounds, no wheezing, rales,  rhonchi or crackles. Normal respiratory effort and patient is not tachypenic. No accessory muscle use.  Supplemental oxygen via nasal cannula Cardiovascular: RRR, no murmurs / rubs / gallops. S1 and S2 auscultated. No extremity edema.  Abdomen: Soft, non-tender, non-distended. Bowel sounds positive.  GU: Deferred. Musculoskeletal: No clubbing / cyanosis of digits/nails. No joint deformity upper and lower extremities.  Skin: No rashes, lesions, ulcers on the skin evaluation. No induration; Warm and dry.  Neurologic: He is awake and tracks with his eyes but does not participate in neurologic examination Psychiatric: Impaired judgment and insight  Data Reviewed: I have personally reviewed following labs and imaging studies  CBC: Recent Labs  Lab 06/14/22 1026 06/16/22 0041 06/17/22 0034 06/18/22 0544 06/19/22 0626  WBC 3.8* 4.9 6.2 5.0 4.3  NEUTROABS 2.3 3.1 4.6 3.3 3.1  HGB 8.5* 9.4* 9.6* 8.7* 8.2*  HCT 26.6* 29.0* 30.2* 27.5* 25.7*  MCV 89.3 89.0 89.1 89.0 88.9  PLT 201 236 215 238 638   Basic Metabolic Panel: Recent Labs  Lab 06/16/22 0041 06/17/22 0034 06/18/22 0544 06/18/22 1647 06/19/22 0626  NA 136 135 140  --  138  K 3.9 3.7 4.4  --  3.5  CL 95* 96* 97*  --  99  CO2 25 22 23   --  25  GLUCOSE 87 66* 86  --  83  BUN 39* 28* 49*  --  31*  CREATININE 5.22* 3.89* 5.59*  --  3.62*  CALCIUM 8.6* 8.9 9.4  --  8.9  MG  --  1.7 2.0 2.0 1.7  PHOS  --  4.6 6.8* 7.1* 3.4   GFR: Estimated Creatinine Clearance: 13.6 mL/min (A) (by C-G formula based on SCr of 3.62 mg/dL (H)). Liver Function Tests: Recent Labs  Lab 06/17/22 0034 06/18/22 0544 06/19/22 0626  AST 79* 58* 52*  ALT 47* 39 35  ALKPHOS 66 62 63  BILITOT 1.2 0.6 0.6  PROT 7.1 6.6 6.3*  ALBUMIN 2.1* 2.0* 1.9*   No results for input(s): "LIPASE", "AMYLASE" in the last 168 hours. No results for input(s): "AMMONIA" in the last 168 hours. Coagulation Profile: No results for input(s): "INR", "PROTIME" in the  last 168 hours. Cardiac Enzymes: No results for input(s): "CKTOTAL", "CKMB", "CKMBINDEX", "TROPONINI" in the last 168 hours. BNP (last 3 results) No results for input(s): "PROBNP" in the last 8760 hours. HbA1C: No results for input(s): "HGBA1C" in the last 72 hours. CBG: Recent Labs  Lab 06/18/22 1151 06/19/22 0032 06/19/22 0412 06/19/22 0735 06/19/22 1139  GLUCAP 78 72 77 79 83   Lipid Profile: No results for input(s): "CHOL", "HDL", "LDLCALC", "TRIG", "CHOLHDL", "LDLDIRECT" in the last 72 hours. Thyroid Function Tests: No results for input(s): "TSH", "T4TOTAL", "FREET4", "T3FREE", "THYROIDAB" in the last 72 hours. Anemia Panel: No results for input(s): "VITAMINB12", "FOLATE", "FERRITIN", "TIBC", "IRON", "RETICCTPCT" in the last 72 hours. Sepsis Labs: No results for input(s): "PROCALCITON", "LATICACIDVEN" in the last 168 hours.  Recent Results (from the past 240 hour(s))  Culture, blood (Routine X 2) w Reflex to ID Panel     Status: None   Collection Time: 06/14/22 10:26 AM   Specimen: BLOOD  Result Value Ref Range Status   Specimen Description   Final    BLOOD BLOOD RIGHT ARM AEROBIC BOTTLE ONLY ANAEROBIC BOTTLE ONLY   Special Requests   Final    BOTTLES DRAWN AEROBIC AND ANAEROBIC Blood Culture adequate volume   Culture   Final  NO GROWTH 5 DAYS Performed at [facility], [place] [address] [place], [place] [address]    Report Status 06/19/2022 FINAL  Final  Culture, blood (Routine X 2) w Reflex to ID Panel     Status: None   Collection Time: 06/14/22 10:26 AM   Specimen: BLOOD RIGHT ARM  Result Value Ref Range Status   Specimen Description   Final    BLOOD RIGHT ARM AEROBIC BOTTLE ONLY ANAEROBIC BOTTLE ONLY   Special Requests   Final    BOTTLES DRAWN AEROBIC AND ANAEROBIC Blood Culture adequate volume   Culture   Final    NO GROWTH 5 DAYS Performed at [facility], [place] [address], [place], [place] [address]    Report Status 06/19/2022 FINAL  Final      Radiology Studies: DG Abd Portable 1V  Result Date: 06/18/2022 CLINICAL DATA:  NG tube placement EXAM: PORTABLE ABDOMEN - 1 VIEW COMPARISON:  [facility] 12/24/2021, CT abdomen/pelvis 05/26/2022 FINDINGS: The enteric catheter tip is in the distal stomach. There is enteric contrast in the colon. There is a nonobstructive bowel gas pattern. There is no definite free intraperitoneal air. There is no acute osseous abnormality. IMPRESSION: 1. Enteric catheter tip in the stomach. 2. Enteric contrast in the colon.  Nonobstructive bowel gas pattern. Electronically Signed   By: Valetta Mole M.D.   On: 06/18/2022 15:58   DG CHEST PORT 1 VIEW  Result Date: 06/18/2022 CLINICAL DATA:  [id number] SOB (shortness of breath) [id number] EXAM: PORTABLE CHEST 1 VIEW COMPARISON:  06/16/2022 chest radiograph. FINDINGS: Right internal jugular central venous catheter terminates in the middle third of the SVC. Surgical clips overlie the medial lower left chest. Stable cardiomediastinal silhouette with normal heart size. No pneumothorax. No pleural effusion. Slight prominence of the parahilar interstitial markings, slightly improved. IMPRESSION: Slight prominence of the parahilar interstitial markings, slightly improved, favor resolving mild pulmonary edema. Electronically Signed   By: Ilona Sorrel M.D.   On: 06/18/2022 08:19     Scheduled Meds:  aspirin  81 mg Per Tube Daily   [START ON 06/20/2022] Chlorhexidine Gluconate Cloth  6 each Topical Q0600   clopidogrel  75 mg Per Tube Daily   darbepoetin (ARANESP) injection - DIALYSIS  60 mcg Subcutaneous Q Thu-1800   diclofenac Sodium  4 g Topical Once   feeding supplement  237 mL Per Tube BID BM   feeding supplement (PROSource TF20)  60 mL Per Tube Daily   heparin  5,000 Units Subcutaneous Q8H   hydrALAZINE  50 mg Per Tube Q8H   labetalol  300 mg Per Tube BID   multivitamin  1 tablet Per Tube QHS   mouth rinse  15 mL Mouth Rinse 4 times per day   pantoprazole  40 mg Oral Daily    sevelamer carbonate  1,600 mg Per Tube TID WC   Continuous Infusions:  feeding supplement (NEPRO CARB STEADY)      LOS: 11 days   Raiford Noble, DO [facility] Available via Epic secure chat 7am-7pm After these hours, please refer to coverage provider listed on [email or web address] 06/19/2022, 3:27 PM

## 2022-06-19 NOTE — Progress Notes (Signed)
Nephrology Follow-Up Consult note   Assessment/Recommendations: Cody Macdonald is a/an 61 y.o. male with a past medical history significant for ESRD, admitted for AHRF and influenza.     Subjective: seen in pt's room, no c/o's  Physical Exam: Vitals:   06/12/22 0936 06/12/22 1200  BP: (!) 145/78 131/74  Pulse: 88 79  Resp:  (!) 24  Temp:  98.2 F (36.8 C)  SpO2:  97%  Constitutional: very cachectic and frail and tired appearing, responding w/ groans and nods CV: normal rate, no edema Respiratory: bilateral chest rise, mild iwob Gastrointestinal: soft, non-tender, no palpable masses  Skin: no visible lesions or rashes Ext: no edema bilat  Psych: alert  LUA AVG+bruit/ RIJ TDC  OP HD: MWF at [facility] [facility] [address]  3.5h  48kg  2.2/5 bath  300/600  Hep none R AVG/ TDC (AVG ok to use reportedly)   CXR 11/10- bilat splotchy perihilar disease, edema vs infxn, improved compared to 11/8 film  Assessment/ Plan: 1.  Acute hypoxic respiratory failure: due to combination of pulmonary edema/volume overload with bronchospasm along with influenza +/- and probable HCAP.  Serial CXR's showed resolution of infiltrates.  2. Altered mental status: per staff at [facility] is not unusual for him not to respond verbally at the OP unit.  3.  ESRD: on HD MWF.  HD tomorrow per holiday schedule.  4. HD access: had TDC troubles initially but working now. We were told that the R AVG was ready to be used as well but we haven't been using here.  5.  HTN/ vol: Blood pressure good. Well under dry wt. BP's not tolerating further UF. Small UF next HD.  6.  Secondary hyperparathyroidism: Home binders ordered 7.  Anemia of chronic disease: Hemoglobin 9.  ESA ordered for 06/10/2022.  No iron for now 8.  GOC - pt very frail, not interacting very much verbally. Appreciate palliative assistance. Poor prognosis.    [name], MD 06/19/2022, 11:24 AM  Recent Labs  Lab 06/18/22 0544 06/18/22 1647 06/19/22 0626   HGB 8.7*  --  8.2*  ALBUMIN 2.0*  --  1.9*  CALCIUM 9.4  --  8.9  PHOS 6.8* 7.1* 3.4  CREATININE 5.59*  --  3.62*  K 4.4  --  3.5     Inpatient medications:  aspirin  81 mg Per Tube Daily   Chlorhexidine Gluconate Cloth  6 each Topical Q0600   Chlorhexidine Gluconate Cloth  6 each Topical Q0600   clopidogrel  75 mg Per Tube Daily   darbepoetin (ARANESP) injection - DIALYSIS  60 mcg Subcutaneous Q Thu-1800   diclofenac Sodium  4 g Topical Once   feeding supplement  237 mL Per Tube BID BM   feeding supplement (PROSource TF20)  60 mL Per Tube Daily   heparin  5,000 Units Subcutaneous Q8H   heparin sodium (porcine)       hydrALAZINE  50 mg Per Tube Q8H   labetalol  300 mg Per Tube BID   multivitamin  1 tablet Per Tube QHS   mouth rinse  15 mL Mouth Rinse 4 times per day   pantoprazole  40 mg Oral Daily   sevelamer carbonate  1,600 mg Per Tube TID WC    feeding supplement (NEPRO CARB STEADY)     acetaminophen, acetaminophen, albuterol, docusate sodium, heparin sodium (porcine), hydrALAZINE, labetalol, mouth rinse, polyethylene glycol

## 2022-06-19 NOTE — Progress Notes (Signed)
   06/19/22 0000  Vitals  Temp 97.8 F (36.6 C)  Temp Source Oral  BP (!) 148/74  Pulse Rate 91  ECG Heart Rate 90  Resp (!) 28  Post Treatment  Dialyzer Clearance Lightly streaked  Duration of HD Treatment -hour(s) 3 hour(s)  Liters Processed 48.6  Fluid Removed (mL) 0 mL  Tolerated HD Treatment Yes   TX fin w/ lots art alarms due to  slight tensing in Rt arm. Note : Lt avf used 11/17.

## 2022-06-19 NOTE — Progress Notes (Signed)
Pt back from hemodialysis.

## 2022-06-20 ENCOUNTER — Inpatient Hospital Stay (HOSPITAL_COMMUNITY): Payer: Medicaid Other

## 2022-06-20 DIAGNOSIS — I160 Hypertensive urgency: Secondary | ICD-10-CM | POA: Diagnosis not present

## 2022-06-20 DIAGNOSIS — J101 Influenza due to other identified influenza virus with other respiratory manifestations: Secondary | ICD-10-CM | POA: Diagnosis not present

## 2022-06-20 DIAGNOSIS — J189 Pneumonia, unspecified organism: Secondary | ICD-10-CM | POA: Diagnosis not present

## 2022-06-20 DIAGNOSIS — J9601 Acute respiratory failure with hypoxia: Secondary | ICD-10-CM | POA: Diagnosis not present

## 2022-06-20 LAB — GLUCOSE, CAPILLARY
Glucose-Capillary: 101 mg/dL — ABNORMAL HIGH (ref 70–99)
Glucose-Capillary: 104 mg/dL — ABNORMAL HIGH (ref 70–99)
Glucose-Capillary: 108 mg/dL — ABNORMAL HIGH (ref 70–99)
Glucose-Capillary: 82 mg/dL (ref 70–99)
Glucose-Capillary: 82 mg/dL (ref 70–99)
Glucose-Capillary: 86 mg/dL (ref 70–99)

## 2022-06-20 LAB — CBC WITH DIFFERENTIAL/PLATELET
Abs Immature Granulocytes: 0.04 10*3/uL (ref 0.00–0.07)
Basophils Absolute: 0 10*3/uL (ref 0.0–0.1)
Basophils Relative: 1 %
Eosinophils Absolute: 0.1 10*3/uL (ref 0.0–0.5)
Eosinophils Relative: 2 %
HCT: 25.7 % — ABNORMAL LOW (ref 39.0–52.0)
Hemoglobin: 8.1 g/dL — ABNORMAL LOW (ref 13.0–17.0)
Immature Granulocytes: 1 %
Lymphocytes Relative: 24 %
Lymphs Abs: 1.1 10*3/uL (ref 0.7–4.0)
MCH: 27.8 pg (ref 26.0–34.0)
MCHC: 31.5 g/dL (ref 30.0–36.0)
MCV: 88.3 fL (ref 80.0–100.0)
Monocytes Absolute: 0.6 10*3/uL (ref 0.1–1.0)
Monocytes Relative: 12 %
Neutro Abs: 3 10*3/uL (ref 1.7–7.7)
Neutrophils Relative %: 60 %
Platelets: 223 10*3/uL (ref 150–400)
RBC: 2.91 MIL/uL — ABNORMAL LOW (ref 4.22–5.81)
RDW: 15.1 % (ref 11.5–15.5)
WBC: 4.8 10*3/uL (ref 4.0–10.5)
nRBC: 0 % (ref 0.0–0.2)

## 2022-06-20 LAB — COMPREHENSIVE METABOLIC PANEL
ALT: 33 U/L (ref 0–44)
AST: 52 U/L — ABNORMAL HIGH (ref 15–41)
Albumin: 1.8 g/dL — ABNORMAL LOW (ref 3.5–5.0)
Alkaline Phosphatase: 65 U/L (ref 38–126)
Anion gap: 14 (ref 5–15)
BUN: 51 mg/dL — ABNORMAL HIGH (ref 6–20)
CO2: 25 mmol/L (ref 22–32)
Calcium: 9.1 mg/dL (ref 8.9–10.3)
Chloride: 96 mmol/L — ABNORMAL LOW (ref 98–111)
Creatinine, Ser: 4.8 mg/dL — ABNORMAL HIGH (ref 0.61–1.24)
GFR, Estimated: 13 mL/min — ABNORMAL LOW (ref >60)
Glucose, Bld: 101 mg/dL — ABNORMAL HIGH (ref 70–99)
Potassium: 3.7 mmol/L (ref 3.5–5.1)
Sodium: 135 mmol/L (ref 135–145)
Total Bilirubin: 0.4 mg/dL (ref 0.3–1.2)
Total Protein: 6.2 g/dL — ABNORMAL LOW (ref 6.5–8.1)

## 2022-06-20 LAB — MAGNESIUM: Magnesium: 1.8 mg/dL (ref 1.7–2.4)

## 2022-06-20 LAB — PHOSPHORUS: Phosphorus: 3.6 mg/dL (ref 2.5–4.6)

## 2022-06-20 MED ORDER — GUAIFENESIN 100 MG/5ML PO LIQD
5.0000 mL | ORAL | Status: DC | PRN
  Administered 2022-06-20 – 2022-06-21 (×2): 5 mL via ORAL
  Filled 2022-06-20 (×2): qty 10

## 2022-06-20 MED ORDER — DARBEPOETIN ALFA 100 MCG/0.5ML IJ SOSY
100.0000 ug | PREFILLED_SYRINGE | INTRAMUSCULAR | Status: DC
  Administered 2022-06-24: 100 ug via SUBCUTANEOUS
  Filled 2022-06-20: qty 0.5

## 2022-06-20 MED ORDER — HEPARIN SODIUM (PORCINE) 1000 UNIT/ML IJ SOLN
INTRAMUSCULAR | Status: AC
  Filled 2022-06-20: qty 4

## 2022-06-20 MED ORDER — FAMOTIDINE 20 MG PO TABS
20.0000 mg | ORAL_TABLET | Freq: Every day | ORAL | Status: DC
  Administered 2022-06-20 – 2022-07-08 (×18): 20 mg
  Filled 2022-06-20 (×18): qty 1

## 2022-06-20 NOTE — Progress Notes (Signed)
Palliative-  Chart reviewed.  Noted patient now has core track in place and tube feedings going.  Mental status appears to be the same. Attempted to see patient, however he was not in room.  He was at dialysis. PMT will continue to follow.  We will see patient and call his son tomorrow.  Mariana Kaufman, AGNP-C Palliative Medicine  No charge

## 2022-06-20 NOTE — Progress Notes (Signed)
Pt asymptomatic.    06/20/22 1510  Assess: MEWS Score  BP (!) 91/57  MAP (mmHg) 68  Pulse Rate 81  ECG Heart Rate 86  Resp (!) 23  SpO2 95 %  Assess: MEWS Score  MEWS Temp 0  MEWS Systolic 1  MEWS Pulse 0  MEWS RR 1  MEWS LOC 0  MEWS Score 2  MEWS Score Color Yellow  Assess: if the MEWS score is Yellow or Red  Were vital signs taken at a resting state? Yes  Focused Assessment Change from prior assessment (see assessment flowsheet)  Does the patient meet 2 or more of the SIRS criteria? Yes  Does the patient have a confirmed or suspected source of infection? Yes  Provider and Rapid Response Notified? No  MEWS guidelines implemented *See Row Information* Yes  Treat  MEWS Interventions Escalated (See documentation below)  Take Vital Signs  Increase Vital Sign Frequency  Yellow: Q 2hr X 2 then Q 4hr X 2, if remains yellow, continue Q 4hrs  Escalate  MEWS: Escalate Yellow: discuss with charge nurse/RN and consider discussing with provider and RRT  Notify: Charge Nurse/RN  Name of Charge Nurse/RN Notified Jasmina RN  Date Charge Nurse/RN Notified 06/20/22  Time Charge Nurse/RN Notified 1605  Provider Notification  Provider Name/Title Sheikh MD  Date Provider Notified 06/20/22  Time Provider Notified 1605  Method of Notification Page  Notification Reason Other (Comment) (soft BP)  Provider response No new orders  Assess: SIRS CRITERIA  SIRS Temperature  0  SIRS Pulse 0  SIRS Respirations  1  SIRS WBC 1  SIRS Score Sum  2

## 2022-06-20 NOTE — Progress Notes (Signed)
[facility] Progress Note   Subjective:   Patient seen and examined at bedside in dialysis.  Alert but does not respond to most questions.  Whispered "no" when asked if he needed anything.    Objective Vitals:   06/20/22 0711 06/20/22 0845 06/20/22 0855 06/20/22 0930  BP: (!) 99/58 (!) 99/58 (!) 96/54 (!) 84/55  Pulse: 88 78 78   Resp: 19 16 (!) 24   Temp: 98.8 F (37.1 C) (!) 97.3 F (36.3 C)    TempSrc: Axillary Temporal    SpO2: 99% 98% 98%   Weight:      Height:       Physical Exam General:chronically ill appearing, cachetic, frail male in NAD Heart:RRR Lungs:CTAB anteriorly  Abdomen:soft, NTND Extremities:no LE edema Dialysis Access: TDC in use, LU AVG +b/t   Filed Weights   06/19/22 0000 06/19/22 0413 06/20/22 0356  Weight: 44.9 kg 44.2 kg 44.5 kg    Intake/Output Summary (Last 24 hours) at 06/20/2022 0951 Last data filed at 06/20/2022 0803 Gross per 24 hour  Intake 487 ml  Output --  Net 487 ml    Additional Objective Labs: Basic Metabolic Panel: Recent Labs  Lab 06/18/22 0544 06/18/22 1647 06/19/22 0626 06/19/22 1756 06/20/22 0629  NA 140  --  138  --  135  K 4.4  --  3.5  --  3.7  CL 97*  --  99  --  96*  CO2 23  --  25  --  25  GLUCOSE 86  --  83  --  101*  BUN 49*  --  31*  --  51*  CREATININE 5.59*  --  3.62*  --  4.80*  CALCIUM 9.4  --  8.9  --  9.1  PHOS 6.8*   < > 3.4 3.8 3.6   < > = values in this interval not displayed.   Liver Function Tests: Recent Labs  Lab 06/18/22 0544 06/19/22 0626 06/20/22 0629  AST 58* 52* 52*  ALT 39 35 33  ALKPHOS 62 63 65  BILITOT 0.6 0.6 0.4  PROT 6.6 6.3* 6.2*  ALBUMIN 2.0* 1.9* 1.8*   CBC: Recent Labs  Lab 06/16/22 0041 06/17/22 0034 06/18/22 0544 06/19/22 0626 06/20/22 0629  WBC 4.9 6.2 5.0 4.3 4.8  NEUTROABS 3.1 4.6 3.3 3.1 3.0  HGB 9.4* 9.6* 8.7* 8.2* 8.1*  HCT 29.0* 30.2* 27.5* 25.7* 25.7*  MCV 89.0 89.1 89.0 88.9 88.3  PLT 236 215 238 203 223   CBG: Recent  Labs  Lab 06/19/22 1627 06/19/22 2010 06/20/22 0018 06/20/22 0401 06/20/22 0815  GLUCAP 95 88 82 86 101*    Studies/Results: DG CHEST PORT 1 VIEW  Result Date: 06/20/2022 CLINICAL DATA:  Short of breath EXAM: PORTABLE CHEST 1 VIEW COMPARISON:  Prior chest x-ray 06/18/2022 FINDINGS: Right IJ tunneled hemodialysis catheter. Tip overlies the mid SVC. Stable cardiac and mediastinal contours. Slightly increased interstitial airspace opacities in the perihilar distribution bilaterally consistent with edema or fluid overload. No pneumothorax. No focal airspace infiltrate. Partially imaged feeding tube. The tip lies below the diaphragm, likely within the stomach or small bowel. IMPRESSION: 1. Perihilar interstitial airspace opacities consistent with pulmonary edema versus volume overload. 2. Well-positioned right IJ tunneled hemodialysis catheter. Electronically Signed   By: Jacqulynn Cadet M.D.   On: 06/20/2022 07:43   DG Abd Portable 1V  Result Date: 06/18/2022 CLINICAL DATA:  NG tube placement EXAM: PORTABLE ABDOMEN - 1 VIEW COMPARISON:  KUB 12/24/2021, CT abdomen/pelvis  05/26/2022 FINDINGS: The enteric catheter tip is in the distal stomach. There is enteric contrast in the colon. There is a nonobstructive bowel gas pattern. There is no definite free intraperitoneal air. There is no acute osseous abnormality. IMPRESSION: 1. Enteric catheter tip in the stomach. 2. Enteric contrast in the colon.  Nonobstructive bowel gas pattern. Electronically Signed   By: Valetta Mole M.D.   On: 06/18/2022 15:58    Medications:  feeding supplement (NEPRO CARB STEADY) 20 mL/hr at 06/20/22 0023    aspirin  81 mg Per Tube Daily   Chlorhexidine Gluconate Cloth  6 each Topical Q0600   clopidogrel  75 mg Per Tube Daily   darbepoetin (ARANESP) injection - DIALYSIS  60 mcg Subcutaneous Q Thu-1800   diclofenac Sodium  4 g Topical Once   feeding supplement  237 mL Per Tube BID BM   feeding supplement (PROSource  TF20)  60 mL Per Tube Daily   heparin  5,000 Units Subcutaneous Q8H   hydrALAZINE  50 mg Per Tube Q8H   labetalol  300 mg Per Tube BID   multivitamin  1 tablet Per Tube QHS   mouth rinse  15 mL Mouth Rinse 4 times per day   pantoprazole  40 mg Oral Daily   sevelamer carbonate  1,600 mg Per Tube TID WC    Dialysis Orders: MWF at [facility] [address]  3.5h  48kg  2.2/5 bath  300/600  Hep none R AVG/ TDC (AVG ok to use reportedly)    CXR 11/10- bilat splotchy perihilar disease, edema vs infxn, improved compared to 11/8 film   Assessment/ Plan: 1.  Acute hypoxic respiratory failure: due to combination of pulmonary edema/volume overload with bronchospasm along with influenza +/- and probable HCAP.  Serial CXR's showed resolution of infiltrates.  2. Altered mental status: per staff at [facility] is not unusual for him not to respond verbally at the OP unit.  3.  ESRD: on HD MWF.  HD today per Thanksgiving Holiday schedule.  Will run Sunday, Tuesday, Friday this week due to [place].  4. HD access: had TDC troubles initially but working now. We were told that the R AVG was ready to be used as well but we haven't been using here.  5.  HTN/ vol: Blood pressure good. Well under dry wt. BP's not tolerating further UF. Minimal UF with HD.  6.  Secondary hyperparathyroidism: Home binders ordered 7.  Anemia of chronic disease: Hemoglobin 8.1.  Increase aranesp to 154mcg for next dose.  No iron for now 8.  GOC - pt very frail, not interacting very much verbally. Appreciate palliative assistance. Poor prognosis.   Jen Mow, PA-C [facility] 06/20/2022,9:51 AM  LOS: 12 days

## 2022-06-20 NOTE — Progress Notes (Signed)
PROGRESS NOTE    Cody Macdonald  GDJ:[id number] DOB: Nov 20, 1960 DOA: 06/08/2022 PCP: Merryl Hacker, No   Brief Narrative:  Cody Macdonald is an 61 y.o. male past medical history of stroke, essential hypertension end-stage renal disease on hemodialysis Monday Wednesday and Friday sent from skilled nursing facility due to dyspnea hypertensive urgency and hyperkalemia requiring 10 L high flow nasal cannula in the ED chest x-ray showing pulmonary edema SARS-CoV-2 PCR was negative.  Admitted initially to the ICU nephrology was consulted for emergent dialysis, started on Cleviprex and empiric antibiotic's vancomycin and cefepime.  [facility] assumed care on 06/11/2022.  He continues to get dialysis and oxygen requirement is weaning and he was on 4 L.  Palliative is involved for goals of care discussion and he remains full code as patient's son has not made a decision yet given that he wishes to include few other family members and the shared decision-making.  Patient's son was agreeable to a Cortrak so this has been initiated and he is getting tube feedings at 10 MLS per hour and will advance by 10 mL to goal rate of 40 mL/h.  Respiratory status and mental is about the same and patient continued to be withdrawn.   06/20/2022: Dialysis today and continues to get core track feedings.  Did not really interact again  Assessment and Plan: Acute respiratory failure with hypoxia secondary to to influenza A infection r/o Other infection -SpO2: 99 % O2 Flow Rate (L/min): 2 L/min FiO2 (%): (!) 4 % -Plan to wean his oxygen down in the next 24 hours.  -On Droplet Precautions -Procalcitonin Level was >150.00 -Lactic Acid Level went from 1.5 -> 1.2 -Blood cultures x2 on 06/14/2022 showed no growth to date 5 days -Empirically on broad spectrum IV antibiotics with IV Cefepime and IV vancomycin given his continued spiking temperatures.  Discontinued yesterday given that he has received 10 days total but still spiked a temperature  and had Tmax of 100.6 in the last 24 hours; if continues to have temperatures may need to reinitiate antibiotics and may need to have ID assistance -C/w Albuterol 2.5 mg q3hprn Wheezing -Chest x-ray done yesterday and showed " Perihilar interstitial airspace opacities consistent with pulmonary edema versus volume overload. Well-positioned right IJ tunneled hemodialysis catheter." -Repeat chest x-ray intermittently and will repeat in the morning   Hypertensive Urgency.  -BP parameters have improved.  -C/w Labetalol 300 mg po BID and Labetalol 10 mg IV q51min PRN High BP -C/w Hydralazine 50 mg po q8h and with IV Hydralazine 10-40 mg q4hprn  -Continue to Monitor BP per Protocol -Last BP is now on the softer side 111/61   ESRD on HD.  Elevated anion gap Hyperphosphatemia -Further management as per nephrology. -Patient's BUNs/creatinine went from 55/5.36 -> 39/5.22 -> 28/3.89 -> 49/5.59 -> 31/3.62 -> 51/4.80 -Patient's anion gap is now 14, chloride level is 96 and CO2 is 25 -Phos level is now 6.8 yesterday and then trended up to 7.1 but is now 3.4 -Avoid further nephrotoxic medications, contrast dyes, hypotension and dehydration to ensure adequate renal perfusion -Underwent dialysis today -Repeat CMP in a.m.   Poor p.o. intake -Has been refusing to eat and not really eating very much -IVF now stopped -Consult nutrition for further evaluation recommendations and Family ok with Trial of Cortrak and now going to be placed.;  See below   H/o CVA -On Aspirin and Clopidogrel , on dysphagia  1 diet.    Anemia of Chronic Kidney Disease  -Stable -Hemoglobin/hematocrit  went from 9.3/29.1 -> 8.5/26.6 -> 9.4/29.0 and is now 9.6/30.2 -> 8.7/27.5 -> 8.2/25.7 -> 8.1/25.7 -Check Anemia Panel in the AM  -We will continue to monitor for signs and symptoms of bleeding; no overt bleeding noted -Repeat CBC in a.m.   Hypoalbuminemia -Patient albumin level was 1.8 on last check -Continue monitor and  trend and repeat CMP in the AM   Acute Metabolic Encephalopathy -Probably sec to influenza A infection, improving mental status today. More alert and shaking head yes and no to questions but did not respond to any of my questioning -Speech evaluated the patient recommended dysphagia 1 diet. -Changed antibiotics to IV Vanco and cefepime given that he continued to spike fevers but has received 10 days of Abx and will stop  -He is awake and but did not answer any questions shaking his head yes or no and he just stared at me most of the time   Abnormal LFTs -Patient's AST was 312 -> 79 -> 58 -> 52 again and AST went from 97 -> 47 -> 39 -> 35 -> 33 -Continue to monitor and trend and repeat CMP in the a.m. if necessary will obtain a right upper quadrant ultrasound as well as a acute hepatitis panel -Repeat CMP in a.m.   Underweight/ Severe Malnutrition in the Context of Chronic Illness/cachexia -Nutrition Status: Nutrition Problem: Severe Malnutrition Etiology: chronic illness (ESRD on HD) Signs/Symptoms: severe muscle depletion, severe fat depletion Interventions: Refer to RD note for recommendations -Estimated body mass index is 14.51 kg/m as calculated from the following:   Height as of this encounter: 5\' 8"  (1.727 m).   Weight as of this encounter: 43.3 kg. -Plan is to trial a Cortrak and initiate Nepro mL per hour with 10 mL advancement every 24 hours for goal of 40 MLS per hour plus PS x1 and continuing dysphagia 1 diet and Ensure twice daily; Cortrak was placed 06/18/22    DVT prophylaxis: heparin injection 5,000 Units Start: 06/08/22 2200 SCDs Start: 06/08/22 1543    Code Status: Full Code Family Communication: No family currently at bedside  Disposition Plan:  Level of care: Progressive Status is: Inpatient Remains inpatient appropriate because: Needs further goals of care discussion and needs to be tolerating a diet prior to safe discharge disposition.  Still remains on oxygen  and will need further oxygen weaning   Consultants:  Nephrology Palliative care medicine  Procedures:  As delineated as above  Antimicrobials:  Anti-infectives (From admission, onward)    Start     Dose/Rate Route Frequency Ordered Stop   06/15/22 0900  ceFEPIme (MAXIPIME) 2 g in sodium chloride 0.9 % 100 mL IVPB        2 g 200 mL/hr over 30 Minutes Intravenous  Once 06/15/22 0804 06/15/22 1044   06/14/22 1200  ceFEPIme (MAXIPIME) 2 g in sodium chloride 0.9 % 100 mL IVPB  Status:  Discontinued        2 g 200 mL/hr over 30 Minutes Intravenous Every M-W-F (Hemodialysis) 06/14/22 1008 06/18/22 1429   06/14/22 1200  vancomycin (VANCOREADY) IVPB 500 mg/100 mL  Status:  Discontinued        500 mg 100 mL/hr over 60 Minutes Intravenous Every M-W-F (Hemodialysis) 06/14/22 1008 06/18/22 1429   06/14/22 1100  vancomycin (VANCOCIN) IVPB 1000 mg/200 mL premix        1,000 mg 200 mL/hr over 60 Minutes Intravenous  Once 06/14/22 1000 06/14/22 1600   06/14/22 1100  ceFEPIme (MAXIPIME) 2 g in sodium  chloride 0.9 % 100 mL IVPB        2 g 200 mL/hr over 30 Minutes Intravenous  Once 06/14/22 1000 06/14/22 1259   06/12/22 2000  oseltamivir (TAMIFLU) capsule 30 mg        30 mg Oral  Once 06/12/22 1440 06/12/22 2149   06/10/22 2100  oseltamivir (TAMIFLU) capsule 30 mg        30 mg Oral  Once 06/10/22 0825 06/11/22 0517   06/10/22 1030  cefTRIAXone (ROCEPHIN) 2 g in sodium chloride 0.9 % 100 mL IVPB  Status:  Discontinued        2 g 200 mL/hr over 30 Minutes Intravenous Every 24 hours 06/10/22 0945 06/14/22 1000   06/09/22 2200  ceFEPIme (MAXIPIME) 1 g in sodium chloride 0.9 % 100 mL IVPB  Status:  Discontinued        1 g 200 mL/hr over 30 Minutes Intravenous Every 24 hours 06/08/22 1611 06/10/22 0945   06/09/22 1015  oseltamivir (TAMIFLU) capsule 30 mg        30 mg Oral  Once 06/09/22 0920 06/09/22 1045   06/08/22 1830  oseltamivir (TAMIFLU) capsule 30 mg  Status:  Discontinued        30 mg Oral   Once 06/08/22 1818 06/09/22 0920   06/08/22 1612  vancomycin variable dose per unstable renal function (pharmacist dosing)  Status:  Discontinued         Does not apply See admin instructions 06/08/22 1612 06/09/22 0920   06/08/22 1100  vancomycin (VANCOCIN) IVPB 1000 mg/200 mL premix        1,000 mg 200 mL/hr over 60 Minutes Intravenous  Once 06/08/22 1057 06/08/22 1329   06/08/22 1100  ceFEPIme (MAXIPIME) 2 g in sodium chloride 0.9 % 100 mL IVPB        2 g 200 mL/hr over 30 Minutes Intravenous  Once 06/08/22 1057 06/08/22 1225        Subjective: Seen and examined at bedside after dialysis and the patient basically looked at you again and did not really interact or respond.  He does track with his eyes but did not say anything.  No family currently at bedside.  Appetite remains very poor and tube feeding goal is increasing.  He had a temperature for the last but none last night.  No other concerns or complaints at this time.  Objective: Vitals:   06/20/22 1200 06/20/22 1241 06/20/22 1510 06/20/22 1604  BP: (!) 115/59 (!) 120/59 (!) 91/57 (!) 90/51  Pulse: 84 87 81 84  Resp: (!) 27 20 (!) 23 (!) 22  Temp: 98 F (36.7 C) 97.8 F (36.6 C)  97.8 F (36.6 C)  TempSrc:  Oral  Oral  SpO2: 98% 98% 95% 99%  Weight:      Height:        Intake/Output Summary (Last 24 hours) at 06/20/2022 1737 Last data filed at 06/20/2022 1242 Gross per 24 hour  Intake 487 ml  Output 0.7 ml  Net 486.3 ml   Filed Weights   06/19/22 0000 06/19/22 0413 06/20/22 0356  Weight: 44.9 kg 44.2 kg 44.5 kg   Examination: Physical Exam:  Constitutional: Thin and very cachectic African-American male in no acute distress Respiratory: Diminished to auscultation bilaterally with coarse breath sounds and some crackles but no appreciable wheezing or rales.  Has some mild rhonchi.  Patient is slightly tachypneic but not using any accessory muscles to breathe.  Wearing supple with oxygen nasal  cannula  Cardiovascular: RRR, no murmurs / rubs / gallops. S1 and S2 auscultated. No extremity edema  Abdomen: Soft, non-tender, non-distended.  Bowel sounds positive.  GU: Deferred. Musculoskeletal: No clubbing / cyanosis of digits/nails. No joint deformity upper and lower extremities. Good ROM, no contractures. Normal strength and muscle tone.  Skin: No rashes, lesions, ulcers on limited skin evaluation Neurologic: Will track with his eyes but not verbally respond and does not want participate in neuro examination Psychiatric: Impaired judgment and insight  Data Reviewed: I have personally reviewed following labs and imaging studies  CBC: Recent Labs  Lab 06/16/22 0041 06/17/22 0034 06/18/22 0544 06/19/22 0626 06/20/22 0629  WBC 4.9 6.2 5.0 4.3 4.8  NEUTROABS 3.1 4.6 3.3 3.1 3.0  HGB 9.4* 9.6* 8.7* 8.2* 8.1*  HCT 29.0* 30.2* 27.5* 25.7* 25.7*  MCV 89.0 89.1 89.0 88.9 88.3  PLT 236 215 238 203 161   Basic Metabolic Panel: Recent Labs  Lab 06/16/22 0041 06/17/22 0034 06/17/22 0034 06/18/22 0544 06/18/22 1647 06/19/22 0626 06/19/22 1756 06/20/22 0629  NA 136 135  --  140  --  138  --  135  K 3.9 3.7  --  4.4  --  3.5  --  3.7  CL 95* 96*  --  97*  --  99  --  96*  CO2 25 22  --  23  --  25  --  25  GLUCOSE 87 66*  --  86  --  83  --  101*  BUN 39* 28*  --  49*  --  31*  --  51*  CREATININE 5.22* 3.89*  --  5.59*  --  3.62*  --  4.80*  CALCIUM 8.6* 8.9  --  9.4  --  8.9  --  9.1  MG  --  1.7   < > 2.0 2.0 1.7 1.6* 1.8  PHOS  --  4.6   < > 6.8* 7.1* 3.4 3.8 3.6   < > = values in this interval not displayed.   GFR: Estimated Creatinine Clearance: 10.3 mL/min (A) (by C-G formula based on SCr of 4.8 mg/dL (H)). Liver Function Tests: Recent Labs  Lab 06/17/22 0034 06/18/22 0544 06/19/22 0626 06/20/22 0629  AST 79* 58* 52* 52*  ALT 47* 39 35 33  ALKPHOS 66 62 63 65  BILITOT 1.2 0.6 0.6 0.4  PROT 7.1 6.6 6.3* 6.2*  ALBUMIN 2.1* 2.0* 1.9* 1.8*   No results for  input(s): "LIPASE", "AMYLASE" in the last 168 hours. No results for input(s): "AMMONIA" in the last 168 hours. Coagulation Profile: No results for input(s): "INR", "PROTIME" in the last 168 hours. Cardiac Enzymes: No results for input(s): "CKTOTAL", "CKMB", "CKMBINDEX", "TROPONINI" in the last 168 hours. BNP (last 3 results) No results for input(s): "PROBNP" in the last 8760 hours. HbA1C: No results for input(s): "HGBA1C" in the last 72 hours. CBG: Recent Labs  Lab 06/20/22 0018 06/20/22 0401 06/20/22 0815 06/20/22 1243 06/20/22 1610  GLUCAP 82 86 101* 82 108*   Lipid Profile: No results for input(s): "CHOL", "HDL", "LDLCALC", "TRIG", "CHOLHDL", "LDLDIRECT" in the last 72 hours. Thyroid Function Tests: No results for input(s): "TSH", "T4TOTAL", "FREET4", "T3FREE", "THYROIDAB" in the last 72 hours. Anemia Panel: No results for input(s): "VITAMINB12", "FOLATE", "FERRITIN", "TIBC", "IRON", "RETICCTPCT" in the last 72 hours. Sepsis Labs: No results for input(s): "PROCALCITON", "LATICACIDVEN" in the last 168 hours.  Recent Results (from the past 240 hour(s))  Culture, blood (Routine X 2) w Reflex to ID  Panel     Status: None   Collection Time: 06/14/22 10:26 AM   Specimen: BLOOD  Result Value Ref Range Status   Specimen Description   Final    BLOOD BLOOD RIGHT ARM AEROBIC BOTTLE ONLY ANAEROBIC BOTTLE ONLY   Special Requests   Final    BOTTLES DRAWN AEROBIC AND ANAEROBIC Blood Culture adequate volume   Culture   Final    NO GROWTH 5 DAYS Performed at [facility], [address] [place], [name] [address]    Report Status 06/19/2022 FINAL  Final  Culture, blood (Routine X 2) w Reflex to ID Panel     Status: None   Collection Time: 06/14/22 10:26 AM   Specimen: BLOOD RIGHT ARM  Result Value Ref Range Status   Specimen Description   Final    BLOOD RIGHT ARM AEROBIC BOTTLE ONLY ANAEROBIC BOTTLE ONLY   Special Requests   Final    BOTTLES DRAWN AEROBIC AND ANAEROBIC  Blood Culture adequate volume   Culture   Final    NO GROWTH 5 DAYS Performed at [facility], [place] [address], [place], [place] [address]    Report Status 06/19/2022 FINAL  Final     Radiology Studies: DG CHEST PORT 1 VIEW  Result Date: 06/20/2022 CLINICAL DATA:  Short of breath EXAM: PORTABLE CHEST 1 VIEW COMPARISON:  Prior chest x-ray 06/18/2022 FINDINGS: Right IJ tunneled hemodialysis catheter. Tip overlies the mid SVC. Stable cardiac and mediastinal contours. Slightly increased interstitial airspace opacities in the perihilar distribution bilaterally consistent with edema or fluid overload. No pneumothorax. No focal airspace infiltrate. Partially imaged feeding tube. The tip lies below the diaphragm, likely within the stomach or small bowel. IMPRESSION: 1. Perihilar interstitial airspace opacities consistent with pulmonary edema versus volume overload. 2. Well-positioned right IJ tunneled hemodialysis catheter. Electronically Signed   By: Jacqulynn Cadet M.D.   On: 06/20/2022 07:43    Scheduled Meds:  aspirin  81 mg Per Tube Daily   Chlorhexidine Gluconate Cloth  6 each Topical Q0600   clopidogrel  75 mg Per Tube Daily   [START ON 06/24/2022] darbepoetin (ARANESP) injection - DIALYSIS  100 mcg Subcutaneous Q Thu-1800   diclofenac Sodium  4 g Topical Once   famotidine  20 mg Per Tube Daily   feeding supplement (PROSource TF20)  60 mL Per Tube Daily   heparin  5,000 Units Subcutaneous Q8H   heparin sodium (porcine)       hydrALAZINE  50 mg Per Tube Q8H   labetalol  300 mg Per Tube BID   multivitamin  1 tablet Per Tube QHS   mouth rinse  15 mL Mouth Rinse 4 times per day   sevelamer carbonate  1,600 mg Per Tube TID WC   Continuous Infusions:  feeding supplement (NEPRO CARB STEADY) 20 mL/hr at 06/20/22 0023    LOS: 12 days   Raiford Noble, DO [facility] Available via Epic secure chat 7am-7pm After these hours, please refer to coverage provider listed on  [email or web address] 06/20/2022, 5:37 PM

## 2022-06-20 NOTE — Progress Notes (Signed)
   06/20/22 1200  Vitals  Temp 98 F (36.7 C)  BP (!) 115/59  Pulse Rate 84  ECG Heart Rate 85  Resp (!) 27  Oxygen Therapy  SpO2 98 %  O2 Device Room Air  Patient Activity (if Appropriate) In bed  Oximetry Probe Site Changed Yes  Post Treatment  Dialyzer Clearance Clear  Duration of HD Treatment -hour(s) 2.22 hour(s)  Hemodialysis Intake (mL) 0 mL  Liters Processed 41.1  Fluid Removed (mL) 0.7 mL  Tolerated HD Treatment No (Comment)  Post-Hemodialysis Comments unable to pull fluid due to low b/p, no s/s of distress, stop treatment early due to poor functioning of catheter Dr Jonnie Finner made aware

## 2022-06-20 NOTE — Plan of Care (Signed)
Pt tolerating tube feeds at 43ml/hr. Pt went to dialysis today. Pt hypotensive and tachypneic this afternoon; MD [facility] notified; hydralazine held and chest xray ordered. Wound consult placed for changes in existing wounds.  Problem: Clinical Measurements: Goal: Ability to maintain clinical measurements within normal limits will improve Outcome: Progressing Goal: Will remain free from infection Outcome: Progressing Goal: Diagnostic test results will improve Outcome: Progressing Goal: Respiratory complications will improve Outcome: Progressing Goal: Cardiovascular complication will be avoided Outcome: Progressing   Problem: Activity: Goal: Risk for activity intolerance will decrease Outcome: Progressing   Problem: Nutrition: Goal: Adequate nutrition will be maintained Outcome: Progressing   Problem: Coping: Goal: Level of anxiety will decrease Outcome: Progressing   Problem: Elimination: Goal: Will not experience complications related to bowel motility Outcome: Progressing Goal: Will not experience complications related to urinary retention Outcome: Progressing   Problem: Pain Managment: Goal: General experience of comfort will improve Outcome: Progressing   Problem: Safety: Goal: Ability to remain free from injury will improve Outcome: Progressing   Problem: Skin Integrity: Goal: Risk for impaired skin integrity will decrease Outcome: Progressing   Problem: Health Behavior/Discharge Planning: Goal: Ability to manage health-related needs will improve Outcome: Not Progressing

## 2022-06-21 ENCOUNTER — Inpatient Hospital Stay (HOSPITAL_COMMUNITY): Payer: Medicaid Other

## 2022-06-21 DIAGNOSIS — R627 Adult failure to thrive: Secondary | ICD-10-CM | POA: Diagnosis not present

## 2022-06-21 DIAGNOSIS — J9601 Acute respiratory failure with hypoxia: Secondary | ICD-10-CM | POA: Diagnosis not present

## 2022-06-21 DIAGNOSIS — N186 End stage renal disease: Secondary | ICD-10-CM | POA: Diagnosis not present

## 2022-06-21 DIAGNOSIS — J189 Pneumonia, unspecified organism: Secondary | ICD-10-CM | POA: Diagnosis not present

## 2022-06-21 DIAGNOSIS — I160 Hypertensive urgency: Secondary | ICD-10-CM | POA: Diagnosis not present

## 2022-06-21 DIAGNOSIS — J101 Influenza due to other identified influenza virus with other respiratory manifestations: Secondary | ICD-10-CM | POA: Diagnosis not present

## 2022-06-21 LAB — COMPREHENSIVE METABOLIC PANEL
ALT: 29 U/L (ref 0–44)
AST: 44 U/L — ABNORMAL HIGH (ref 15–41)
Albumin: 1.7 g/dL — ABNORMAL LOW (ref 3.5–5.0)
Alkaline Phosphatase: 62 U/L (ref 38–126)
Anion gap: 13 (ref 5–15)
BUN: 35 mg/dL — ABNORMAL HIGH (ref 6–20)
CO2: 27 mmol/L (ref 22–32)
Calcium: 9 mg/dL (ref 8.9–10.3)
Chloride: 95 mmol/L — ABNORMAL LOW (ref 98–111)
Creatinine, Ser: 3.41 mg/dL — ABNORMAL HIGH (ref 0.61–1.24)
GFR, Estimated: 20 mL/min — ABNORMAL LOW (ref >60)
Glucose, Bld: 89 mg/dL (ref 70–99)
Potassium: 3.4 mmol/L — ABNORMAL LOW (ref 3.5–5.1)
Sodium: 135 mmol/L (ref 135–145)
Total Bilirubin: 0.4 mg/dL (ref 0.3–1.2)
Total Protein: 6.1 g/dL — ABNORMAL LOW (ref 6.5–8.1)

## 2022-06-21 LAB — CBC WITH DIFFERENTIAL/PLATELET
Abs Immature Granulocytes: 0.03 10*3/uL (ref 0.00–0.07)
Basophils Absolute: 0 10*3/uL (ref 0.0–0.1)
Basophils Relative: 1 %
Eosinophils Absolute: 0.1 10*3/uL (ref 0.0–0.5)
Eosinophils Relative: 1 %
HCT: 23.2 % — ABNORMAL LOW (ref 39.0–52.0)
Hemoglobin: 7.5 g/dL — ABNORMAL LOW (ref 13.0–17.0)
Immature Granulocytes: 1 %
Lymphocytes Relative: 32 %
Lymphs Abs: 1.5 10*3/uL (ref 0.7–4.0)
MCH: 28.4 pg (ref 26.0–34.0)
MCHC: 32.3 g/dL (ref 30.0–36.0)
MCV: 87.9 fL (ref 80.0–100.0)
Monocytes Absolute: 0.5 10*3/uL (ref 0.1–1.0)
Monocytes Relative: 10 %
Neutro Abs: 2.6 10*3/uL (ref 1.7–7.7)
Neutrophils Relative %: 55 %
Platelets: 210 10*3/uL (ref 150–400)
RBC: 2.64 MIL/uL — ABNORMAL LOW (ref 4.22–5.81)
RDW: 14.9 % (ref 11.5–15.5)
WBC: 4.7 10*3/uL (ref 4.0–10.5)
nRBC: 0 % (ref 0.0–0.2)

## 2022-06-21 LAB — IRON AND TIBC
Iron: 18 ug/dL — ABNORMAL LOW (ref 45–182)
Saturation Ratios: 12 % — ABNORMAL LOW (ref 17.9–39.5)
TIBC: 147 ug/dL — ABNORMAL LOW (ref 250–450)
UIBC: 129 ug/dL

## 2022-06-21 LAB — GLUCOSE, CAPILLARY
Glucose-Capillary: 100 mg/dL — ABNORMAL HIGH (ref 70–99)
Glucose-Capillary: 102 mg/dL — ABNORMAL HIGH (ref 70–99)
Glucose-Capillary: 104 mg/dL — ABNORMAL HIGH (ref 70–99)
Glucose-Capillary: 105 mg/dL — ABNORMAL HIGH (ref 70–99)
Glucose-Capillary: 92 mg/dL (ref 70–99)
Glucose-Capillary: 99 mg/dL (ref 70–99)

## 2022-06-21 LAB — RETICULOCYTES
Immature Retic Fract: 8.8 % (ref 2.3–15.9)
RBC.: 2.78 MIL/uL — ABNORMAL LOW (ref 4.22–5.81)
Retic Count, Absolute: 15.8 10*3/uL — ABNORMAL LOW (ref 19.0–186.0)
Retic Ct Pct: 0.6 % (ref 0.4–3.1)

## 2022-06-21 LAB — VITAMIN B12: Vitamin B-12: 952 pg/mL — ABNORMAL HIGH (ref 180–914)

## 2022-06-21 LAB — FOLATE: Folate: 9.8 ng/mL (ref >5.9)

## 2022-06-21 LAB — FERRITIN: Ferritin: 711 ng/mL — ABNORMAL HIGH (ref 24–336)

## 2022-06-21 LAB — PHOSPHORUS: Phosphorus: 2.2 mg/dL — ABNORMAL LOW (ref 2.5–4.6)

## 2022-06-21 LAB — MAGNESIUM: Magnesium: 1.6 mg/dL — ABNORMAL LOW (ref 1.7–2.4)

## 2022-06-21 MED ORDER — MEDIHONEY WOUND/BURN DRESSING EX PSTE
1.0000 | PASTE | Freq: Every day | CUTANEOUS | Status: AC
  Administered 2022-06-21 – 2022-07-03 (×14): 1 via TOPICAL
  Filled 2022-06-21 (×2): qty 44

## 2022-06-21 NOTE — Progress Notes (Signed)
[facility] Progress Note   Subjective:    HD yesterday 0.7L Seen in room, in bed, not very engaging  Objective Vitals:   06/21/22 0400 06/21/22 0500 06/21/22 0600 06/21/22 0737  BP: (!) 97/58 (!) 97/58 112/64 (!) 99/55  Pulse: 83 83 91 89  Resp: 16 (!) 34 (!) 24 18  Temp: 97.6 F (36.4 C)   (!) 97.4 F (36.3 C)  TempSrc: Oral   Oral  SpO2: 99% 99% 97% 97%  Weight:      Height:       Physical Exam General:chronically ill appearing, cachetic, frail male in NAD Heart:RRR Lungs:CTAB anteriorly  Abdomen:soft, NTND Extremities:no LE edema Dialysis Access: TDC, LU AVG +b/t   Filed Weights   06/19/22 0413 06/20/22 0356 06/21/22 0023  Weight: 44.2 kg 44.5 kg 45.6 kg    Intake/Output Summary (Last 24 hours) at 06/21/2022 0914 Last data filed at 06/20/2022 2300 Gross per 24 hour  Intake 574 ml  Output 0.7 ml  Net 573.3 ml     Additional Objective Labs: Basic Metabolic Panel: Recent Labs  Lab 06/19/22 0626 06/19/22 1756 06/20/22 0629 06/21/22 0017  NA 138  --  135 135  K 3.5  --  3.7 3.4*  CL 99  --  96* 95*  CO2 25  --  25 27  GLUCOSE 83  --  101* 89  BUN 31*  --  51* 35*  CREATININE 3.62*  --  4.80* 3.41*  CALCIUM 8.9  --  9.1 9.0  PHOS 3.4 3.8 3.6 2.2*    Liver Function Tests: Recent Labs  Lab 06/19/22 0626 06/20/22 0629 06/21/22 0017  AST 52* 52* 44*  ALT 35 33 29  ALKPHOS 63 65 62  BILITOT 0.6 0.4 0.4  PROT 6.3* 6.2* 6.1*  ALBUMIN 1.9* 1.8* 1.7*    CBC: Recent Labs  Lab 06/17/22 0034 06/18/22 0544 06/19/22 0626 06/20/22 0629 06/21/22 0017  WBC 6.2 5.0 4.3 4.8 4.7  NEUTROABS 4.6 3.3 3.1 3.0 2.6  HGB 9.6* 8.7* 8.2* 8.1* 7.5*  HCT 30.2* 27.5* 25.7* 25.7* 23.2*  MCV 89.1 89.0 88.9 88.3 87.9  PLT 215 238 203 223 210    CBG: Recent Labs  Lab 06/20/22 1610 06/20/22 2008 06/21/22 0022 06/21/22 0407 06/21/22 0739  GLUCAP 108* 104* 102* 92 104*     Studies/Results: DG CHEST PORT 1 VIEW  Result Date:  06/21/2022 CLINICAL DATA:  Shortness of breath. In stage kidney disease. Hypertension. Stroke EXAM: PORTABLE CHEST 1 VIEW COMPARISON:  06/20/2022 FINDINGS: Right IJ catheter tip is in the projection of the distal SVC. There is a feeding tube with tip below the level of the hemidiaphragms. Stable cardiomediastinal contours. Lungs appear hyperinflated. IMPRESSION: No active disease. Electronically Signed   By: Kerby Moors M.D.   On: 06/21/2022 05:47   DG CHEST PORT 1 VIEW  Result Date: 06/20/2022 CLINICAL DATA:  Short of breath EXAM: PORTABLE CHEST 1 VIEW COMPARISON:  Prior chest x-ray 06/18/2022 FINDINGS: Right IJ tunneled hemodialysis catheter. Tip overlies the mid SVC. Stable cardiac and mediastinal contours. Slightly increased interstitial airspace opacities in the perihilar distribution bilaterally consistent with edema or fluid overload. No pneumothorax. No focal airspace infiltrate. Partially imaged feeding tube. The tip lies below the diaphragm, likely within the stomach or small bowel. IMPRESSION: 1. Perihilar interstitial airspace opacities consistent with pulmonary edema versus volume overload. 2. Well-positioned right IJ tunneled hemodialysis catheter. Electronically Signed   By: Jacqulynn Cadet M.D.   On: 06/20/2022 07:43  Medications:  feeding supplement (NEPRO CARB STEADY) 30 mL/hr at 06/21/22 0016    aspirin  81 mg Per Tube Daily   Chlorhexidine Gluconate Cloth  6 each Topical Q0600   clopidogrel  75 mg Per Tube Daily   [START ON 06/24/2022] darbepoetin (ARANESP) injection - DIALYSIS  100 mcg Subcutaneous Q Thu-1800   diclofenac Sodium  4 g Topical Once   famotidine  20 mg Per Tube Daily   feeding supplement (PROSource TF20)  60 mL Per Tube Daily   heparin  5,000 Units Subcutaneous Q8H   hydrALAZINE  50 mg Per Tube Q8H   labetalol  300 mg Per Tube BID   multivitamin  1 tablet Per Tube QHS   mouth rinse  15 mL Mouth Rinse 4 times per day   sevelamer carbonate  1,600 mg Per  Tube TID WC    Dialysis Orders: MWF at [facility] [address]  3.5h  48kg  2.2/5 bath  300/600  Hep none R AVG/TDC (AVG ok to use reportedly)    CXR 11/10- bilat splotchy perihilar disease, edema vs infxn, improved compared to 11/8 film   Assessment/ Plan: 1.  Acute hypoxic respiratory failure: due to combination of pulmonary edema/volume overload with bronchospasm along with influenza +/- and probable HCAP.  Serial CXR's showed resolution of infiltrates.  2. Altered mental status: per staff at [facility] is not unusual for him not to respond verbally at the OP unit.  3.  ESRD: on HD MWF.  HD Sun/Tues/Fri this week per Thanksgiving Holiday schedule.   4. HD access: had TDC troubles initially but working now. We were told that the R AVG was ready to be used as well but we haven't been using here.  5.  HTN/ vol: Blood pressure good. Well under dry wt. BP's not tolerating further UF. Minimal UF with HD.  6.  Secondary hyperparathyroidism: HOld sevelamer as P < 3.0 7.  Anemia of chronic disease: Hemoglobin 8.1.  Increase aranesp to 130mcg for next dose.  No iron for now 8.  GOC - pt very frail, not interacting very much verbally. Appreciate palliative assistance. Poor prognosis.   Rexene Agent, MD  [facility] 06/21/2022,9:14 AM  LOS: 13 days

## 2022-06-21 NOTE — Progress Notes (Signed)
PROGRESS NOTE    Cody Macdonald  OEH:[id number] DOB: 1961-05-05 DOA: 06/08/2022 PCP: Merryl Hacker, No   Brief Narrative:  Cody Macdonald is an 61 y.o. male past medical history of stroke, essential hypertension end-stage renal disease on hemodialysis Monday Wednesday and Friday sent from skilled nursing facility due to dyspnea hypertensive urgency and hyperkalemia requiring 10 L high flow nasal cannula in the ED chest x-ray showing pulmonary edema SARS-CoV-2 PCR was negative.  Admitted initially to the ICU nephrology was consulted for emergent dialysis, started on Cleviprex and empiric antibiotic's vancomycin and cefepime.  [facility] assumed care on 06/11/2022.  He continues to get dialysis and oxygen requirement is weaning and he was on 4 L.  Palliative is involved for goals of care discussion and he remains full code as patient's son has not made a decision yet given that he wishes to include few other family members and the shared decision-making.  Patient's son was agreeable to a Cortrak so this has been initiated and he is getting tube feedings at 10 MLS per hour and will advance by 10 mL to goal rate of 40 mL/h.  Respiratory status and mental is about the same and patient continued to be withdrawn.    06/20/2022: Dialysis today and continues to get core track feedings.  Did not really interact again   Started developing some new areas of pressure wounds given his poor nutritional status and lack of mobility.  Palliative care continued to have goals of care discussion and patient's son to decide with the family about PEG tube feedings given that he is not very interactive and because he continues to not eat well.  SLP evaluated and recommending continuing n.p.o.  Palliative care now discussing about PEG tube feeding placement given that patient is not taking anything orally well.  Palliative met with son at bedside and there has been any improvement in his strength, mental status or even his p.o. intake  with the tube feeding.  And family wishes to discuss within themselves.  Assessment and Plan:  Acute respiratory failure with hypoxia secondary to to influenza A infection r/o Other infection -SpO2: 99 % O2 Flow Rate (L/min): 2 L/min FiO2 (%): (!) 4 % -Plan to wean his oxygen down in the next 24 hours.  -On Droplet Precautions -Procalcitonin Level was >150.00 -Lactic Acid Level went from 1.5 -> 1.2 -Blood cultures x2 on 06/14/2022 showed no growth to date 5 days -Empirically on broad spectrum IV antibiotics with IV Cefepime and IV vancomycin given his continued spiking temperatures.  Discontinued yesterday given that he has received 10 days total but still spiked a temperature and had Tmax of 100.6 in the last 24 hours; if continues to have temperatures may need to reinitiate antibiotics and may need to have ID assistance -C/w Albuterol 2.5 mg q3hprn Wheezing -Chest x-ray done yesterday showed " Perihilar interstitial airspace opacities consistent with pulmonary edema versus volume overload. Well-positioned right IJ tunneled hemodialysis catheter." -CXR today showed "Right IJ catheter tip is in the projection of the distal SVC. There is a feeding tube with tip below the level of the hemidiaphragms. Stable cardiomediastinal contours. Lungs appear hyperinflated" -Repeat chest x-ray intermittently and will repeat in the morning   Hypertensive Urgency improved significantly and now has a softer blood pressure -BP parameters have improved.  -C/w Labetalol 300 mg po BID and Labetalol 10 mg IV q82mn PRN High BP -C/w Hydralazine 50 mg po q8h and with IV Hydralazine 10-40 mg q4hprn  -Continue to  Monitor BP per Protocol -Last BP is now on the softer side 111/61   ESRD on HD.  Elevated anion gap Hyperphosphatemia hypophosphatemic -Further management as per nephrology. -Patient's BUNs/creatinine went from 55/5.36 -> 39/5.22 -> 28/3.89 -> 49/5.59 -> 31/3.62 -> 51/4.80 and is now 35/3.41 -Patient's  anion gap is now 13, chloride level is 95 and CO2 is 27 -Phos level is now 6.8 yesterday and then trended up to 7.1 but is now 3.4 -Avoid further nephrotoxic medications, contrast dyes, hypotension and dehydration to ensure adequate renal perfusion -Underwent dialysis yesterday -Repeat CMP in a.m.  Hypophosphatemia, hypokalemia and hypomagnesemia -Likely to be corrected in dialysis -Continue to monitor trend and repeat CMP, mag and Phos in the a.m.   Poor p.o. intake -Has been refusing to eat and not really eating very much -IVF now stopped -Consult nutrition for further evaluation recommendations and Family ok with Trial of Cortrak and now going to be placed.;  See below   H/o CVA -On Aspirin and Clopidogrel , on dysphagia  1 diet.    Anemia of Chronic Kidney Disease  -Stable -Hemoglobin/hematocrit went from 9.3/29.1 -> 8.5/26.6 -> 9.4/29.0 and is now 9.6/30.2 -> 8.7/27.5 -> 8.2/25.7 -> 8.1/25.7 -> 7.5/23.2 -Checked Anemia Panel and showed an iron level 18, UIBC 129, TIBC 147, saturation ratios of 12%, ferritin level 711, folate level 9.8, vitamin B12 952 -We will continue to monitor for signs and symptoms of bleeding; no overt bleeding noted -Repeat CBC in a.m.   Hypoalbuminemia -Patient albumin level was 1.7 on last check -Continue monitor and trend and repeat CMP in the AM   Acute Metabolic Encephalopathy -Probably sec to influenza A infection, improving mental status today. More alert and shaking head yes and no to questions but did not respond to any of my questioning -Speech evaluated the patient recommended dysphagia 1 diet. -Changed antibiotics to IV Vanco and cefepime given that he continued to spike fevers but has received 10 days of Abx and will stop  -He is awake and but did not answer any questions shaking his head yes or no and he just stared at me most of the time   Abnormal LFTs -Patient's AST was 312 -> 79 -> 58 -> 52 -> 44 again and AST went from 97 -> 47 -> 39  -> 35 -> 33 -> 29 -Continue to monitor and trend and repeat CMP in the a.m. if necessary will obtain a right upper quadrant ultrasound as well as a acute hepatitis panel -Repeat CMP in a.m.   Underweight/ Severe Malnutrition in the Context of Chronic Illness/cachexia -Nutrition Status: Nutrition Problem: Severe Malnutrition Etiology: chronic illness (ESRD on HD) Signs/Symptoms: severe muscle depletion, severe fat depletion Interventions: Refer to RD note for recommendations -Estimated body mass index is 14.51 kg/m as calculated from the following:   Height as of this encounter: _0  (1.727 m).   Weight as of this encounter: 43.3 kg. -Plan is to trial a Cortrak and initiate Nepro mL per hour with 10 mL advancement every 24 hours for goal of 40 MLS per hour plus PS x1 and continuing dysphagia 1 diet and Ensure twice daily; Cortrak was placed 06/18/22   Pressure Ulcers, not present on admission -[place] Nurse consulted and recommendations made -The wound care nurse has recommended a silicone foam to the left ear as well as leave the left medial foot wounds open to the air as well as debridement agent and antimicrobial to sacrum daily and they are recommending the patient can  be turned side to side and they are not can add an air mattress.  The wound care nurse also recommend Prevalon boots in place for offloading heels but recommended the staff needing to be cautious when applying straps and a foot placement when the areas have appeared. -Poor Prognosis given his lack of oral intake and nutritional status   DVT prophylaxis: heparin injection 5,000 Units Start: 06/08/22 2200 SCDs Start: 06/08/22 1543    Code Status: Full Code Family Communication: No family currently at bedside  Disposition Plan:  Level of care: Progressive Status is: Inpatient Remains inpatient appropriate because: Needs further clinical improvement and addressing his nutritional status given that he has not taken anything  orally and palliative discussing permanent PEG tube placement if continue life-prolonging care is desired   Consultants:  Nephrology Palliative care medicine  Procedures:  As delineated as above  Antimicrobials:  Anti-infectives (From admission, onward)    Start     Dose/Rate Route Frequency Ordered Stop   06/15/22 0900  ceFEPIme (MAXIPIME) 2 g in sodium chloride 0.9 % 100 mL IVPB        2 g 200 mL/hr over 30 Minutes Intravenous  Once 06/15/22 0804 06/15/22 1044   06/14/22 1200  ceFEPIme (MAXIPIME) 2 g in sodium chloride 0.9 % 100 mL IVPB  Status:  Discontinued        2 g 200 mL/hr over 30 Minutes Intravenous Every M-W-F (Hemodialysis) 06/14/22 1008 06/18/22 1429   06/14/22 1200  vancomycin (VANCOREADY) IVPB 500 mg/100 mL  Status:  Discontinued        500 mg 100 mL/hr over 60 Minutes Intravenous Every M-W-F (Hemodialysis) 06/14/22 1008 06/18/22 1429   06/14/22 1100  vancomycin (VANCOCIN) IVPB 1000 mg/200 mL premix        1,000 mg 200 mL/hr over 60 Minutes Intravenous  Once 06/14/22 1000 06/14/22 1600   06/14/22 1100  ceFEPIme (MAXIPIME) 2 g in sodium chloride 0.9 % 100 mL IVPB        2 g 200 mL/hr over 30 Minutes Intravenous  Once 06/14/22 1000 06/14/22 1259   06/12/22 2000  oseltamivir (TAMIFLU) capsule 30 mg        30 mg Oral  Once 06/12/22 1440 06/12/22 2149   06/10/22 2100  oseltamivir (TAMIFLU) capsule 30 mg        30 mg Oral  Once 06/10/22 0825 06/11/22 0517   06/10/22 1030  cefTRIAXone (ROCEPHIN) 2 g in sodium chloride 0.9 % 100 mL IVPB  Status:  Discontinued        2 g 200 mL/hr over 30 Minutes Intravenous Every 24 hours 06/10/22 0945 06/14/22 1000   06/09/22 2200  ceFEPIme (MAXIPIME) 1 g in sodium chloride 0.9 % 100 mL IVPB  Status:  Discontinued        1 g 200 mL/hr over 30 Minutes Intravenous Every 24 hours 06/08/22 1611 06/10/22 0945   06/09/22 1015  oseltamivir (TAMIFLU) capsule 30 mg        30 mg Oral  Once 06/09/22 0920 06/09/22 1045   06/08/22 1830  oseltamivir  (TAMIFLU) capsule 30 mg  Status:  Discontinued        30 mg Oral  Once 06/08/22 1818 06/09/22 0920   06/08/22 1612  vancomycin variable dose per unstable renal function (pharmacist dosing)  Status:  Discontinued         Does not apply See admin instructions 06/08/22 1612 06/09/22 0920   06/08/22 1100  vancomycin (VANCOCIN) IVPB 1000 mg/200 mL  premix        1,000 mg 200 mL/hr over 60 Minutes Intravenous  Once 06/08/22 1057 06/08/22 1329   06/08/22 1100  ceFEPIme (MAXIPIME) 2 g in sodium chloride 0.9 % 100 mL IVPB        2 g 200 mL/hr over 30 Minutes Intravenous  Once 06/08/22 1057 06/08/22 1225        Subjective: Seen and at bedside and he was a little bit more awake and did answer yes and no to most of the questions but then stopped talking with me.  No lightheadedness or dizziness.  No apparent distress, at bedside.  Objective: Vitals:   06/21/22 0500 06/21/22 0600 06/21/22 0737 06/21/22 1200  BP: (!) 97/58 112/64 (!) 99/55 (!) 119/59  Pulse: 83 91 89 85  Resp: (!) 34 (!) _0 Temp:   (!) 97.4 F (36.3 C) 97.7 F (36.5 C)  TempSrc:   Oral Oral  SpO2: 99% 97% 97% 99%  Weight:      Height:        Intake/Output Summary (Last 24 hours) at 06/21/2022 1555 Last data filed at 06/20/2022 2300 Gross per 24 hour  Intake 424 ml  Output --  Net 424 ml   Filed Weights   06/19/22 0413 06/20/22 0356 06/21/22 0023  Weight: 44.2 kg 44.5 kg 45.6 kg   Examination: Physical Exam:  Constitutional: Patient is a thin and very cachectic African-American male who appears slightly uncomfortable Respiratory: Diminished to auscultation bilaterally, no wheezing, rales, rhonchi or crackles. Normal respiratory effort and patient is not tachypenic. No accessory muscle use.  Wearing supplemental oxygen via nasal cannula Cardiovascular: RRR, no murmurs / rubs / gallops. S1 and S2 auscultated. No extremity edema. 2+ pedal pulses. No carotid bruits.  Abdomen: Soft, non-tender, non-distended. Bowel  sounds positive.  GU: Deferred. Neurologic: A bit more awake and alert and does track with his eyes and did respond minimally today but did not want to participate in our examination Psychiatric: Impaired judgment and insight  Data Reviewed: I have personally reviewed following labs and imaging studies  CBC: Recent Labs  Lab 06/17/22 0034 06/18/22 0544 06/19/22 0626 06/20/22 0629 06/21/22 0017  WBC 6.2 5.0 4.3 4.8 4.7  NEUTROABS 4.6 3.3 3.1 3.0 2.6  HGB 9.6* 8.7* 8.2* 8.1* 7.5*  HCT 30.2* 27.5* 25.7* 25.7* 23.2*  MCV 89.1 89.0 88.9 88.3 87.9  PLT 215 238 203 223 254   Basic Metabolic Panel: Recent Labs  Lab 06/17/22 0034 06/18/22 0544 06/18/22 1647 06/19/22 0626 06/19/22 1756 06/20/22 0629 06/21/22 0017  NA 135 140  --  138  --  135 135  K 3.7 4.4  --  3.5  --  3.7 3.4*  CL 96* 97*  --  99  --  96* 95*  CO2 22 23  --  25  --  25 27  GLUCOSE 66* 86  --  83  --  101* 89  BUN 28* 49*  --  31*  --  51* 35*  CREATININE 3.89* 5.59*  --  3.62*  --  4.80* 3.41*  CALCIUM 8.9 9.4  --  8.9  --  9.1 9.0  MG 1.7 2.0 2.0 1.7 1.6* 1.8 1.6*  PHOS 4.6 6.8* 7.1* 3.4 3.8 3.6 2.2*   GFR: Estimated Creatinine Clearance: 14.9 mL/min (A) (by C-G formula based on SCr of 3.41 mg/dL (H)). Liver Function Tests: Recent Labs  Lab 06/17/22 0034 06/18/22 0544 06/19/22 9826 06/20/22 4158 06/21/22 0017  AST 79* 58* 52* 52* 44*  ALT 47* 39 35 33 29  ALKPHOS 66 62 63 65 62  BILITOT 1.2 0.6 0.6 0.4 0.4  PROT 7.1 6.6 6.3* 6.2* 6.1*  ALBUMIN 2.1* 2.0* 1.9* 1.8* 1.7*   No results for input(s): "LIPASE", "AMYLASE" in the last 168 hours. No results for input(s): "AMMONIA" in the last 168 hours. Coagulation Profile: No results for input(s): "INR", "PROTIME" in the last 168 hours. Cardiac Enzymes: No results for input(s): "CKTOTAL", "CKMB", "CKMBINDEX", "TROPONINI" in the last 168 hours. BNP (last 3 results) No results for input(s): "PROBNP" in the last 8760 hours. HbA1C: No results for  input(s): "HGBA1C" in the last 72 hours. CBG: Recent Labs  Lab 06/20/22 2008 06/21/22 0022 06/21/22 0407 06/21/22 0739 06/21/22 1140  GLUCAP 104* 102* 92 104* 105*   Lipid Profile: No results for input(s): "CHOL", "HDL", "LDLCALC", "TRIG", "CHOLHDL", "LDLDIRECT" in the last 72 hours. Thyroid Function Tests: No results for input(s): "TSH", "T4TOTAL", "FREET4", "T3FREE", "THYROIDAB" in the last 72 hours. Anemia Panel: Recent Labs    06/21/22 0017  VITAMINB12 952*  FOLATE 9.8  FERRITIN 711*  TIBC 147*  IRON 18*  RETICCTPCT 0.6   Sepsis Labs: No results for input(s): "PROCALCITON", "LATICACIDVEN" in the last 168 hours.  Recent Results (from the past 240 hour(s))  Culture, blood (Routine X 2) w Reflex to ID Panel     Status: None   Collection Time: 06/14/22 10:26 AM   Specimen: BLOOD  Result Value Ref Range Status   Specimen Description   Final    BLOOD BLOOD RIGHT ARM AEROBIC BOTTLE ONLY ANAEROBIC BOTTLE ONLY   Special Requests   Final    BOTTLES DRAWN AEROBIC AND ANAEROBIC Blood Culture adequate volume   Culture   Final    NO GROWTH 5 DAYS Performed at [facility], [address], [place], [place] [address]    Report Status 06/19/2022 FINAL  Final  Culture, blood (Routine X 2) w Reflex to ID Panel     Status: None   Collection Time: 06/14/22 10:26 AM   Specimen: BLOOD RIGHT ARM  Result Value Ref Range Status   Specimen Description   Final    BLOOD RIGHT ARM AEROBIC BOTTLE ONLY ANAEROBIC BOTTLE ONLY   Special Requests   Final    BOTTLES DRAWN AEROBIC AND ANAEROBIC Blood Culture adequate volume   Culture   Final    NO GROWTH 5 DAYS Performed at [facility], [address], [place], [place] [address]    Report Status 06/19/2022 FINAL  Final    adiology Studies: DG CHEST PORT 1 VIEW  Result Date: 06/21/2022 CLINICAL DATA:  Shortness of breath. In stage kidney disease. Hypertension. Stroke EXAM: PORTABLE CHEST 1 VIEW COMPARISON:  06/20/2022 FINDINGS:  Right IJ catheter tip is in the projection of the distal SVC. There is a feeding tube with tip below the level of the hemidiaphragms. Stable cardiomediastinal contours. Lungs appear hyperinflated. IMPRESSION: No active disease. Electronically Signed   By: Kerby Moors M.D.   On: 06/21/2022 05:47   DG CHEST PORT 1 VIEW  Result Date: 06/20/2022 CLINICAL DATA:  Short of breath EXAM: PORTABLE CHEST 1 VIEW COMPARISON:  Prior chest x-ray 06/18/2022 FINDINGS: Right IJ tunneled hemodialysis catheter. Tip overlies the mid SVC. Stable cardiac and mediastinal contours. Slightly increased interstitial airspace opacities in the perihilar distribution bilaterally consistent with edema or fluid overload. No pneumothorax. No focal airspace infiltrate. Partially imaged feeding tube. The tip  lies below the diaphragm, likely within the stomach or small bowel. IMPRESSION: 1. Perihilar interstitial airspace opacities consistent with pulmonary edema versus volume overload. 2. Well-positioned right IJ tunneled hemodialysis catheter. Electronically Signed   By: Jacqulynn Cadet M.D.   On: 06/20/2022 07:43    Scheduled Meds:  aspirin  81 mg Per Tube Daily   Chlorhexidine Gluconate Cloth  6 each Topical Q0600   clopidogrel  75 mg Per Tube Daily   [START ON 06/24/2022] darbepoetin (ARANESP) injection - DIALYSIS  100 mcg Subcutaneous Q Thu-1800   diclofenac Sodium  4 g Topical Once   famotidine  20 mg Per Tube Daily   feeding supplement (PROSource TF20)  60 mL Per Tube Daily   heparin  5,000 Units Subcutaneous Q8H   hydrALAZINE  50 mg Per Tube Q8H   labetalol  300 mg Per Tube BID   leptospermum manuka honey  1 Application Topical Daily   multivitamin  1 tablet Per Tube QHS   mouth rinse  15 mL Mouth Rinse 4 times per day   Continuous Infusions:  feeding supplement (NEPRO CARB STEADY) 30 mL/hr at 06/21/22 0016    LOS: 2 days   Raiford Noble, DO [facility] Available via Epic secure chat 7am-7pm After  these hours, please refer to coverage provider listed on [email or web address] 06/21/2022, 3:55 PM

## 2022-06-21 NOTE — Progress Notes (Signed)
Daily Progress Note   Patient Name: Cody Macdonald       Date: 06/21/2022 DOB: Jul 06, 1961  Age: 61 y.o. MRN#: [id number] Attending Physician: Kerney Elbe, DO Primary Care Physician: Pcp, No Admit Date: 06/08/2022  Reason for Consultation/Follow-up: Establishing goals of care  Patient Profile/HPI:  61 y.o. 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 Workup revealed sepsis possibly related pnuemonia, influenza A.  His mental status has waxed and waned during admission and he is with very poor po intake. Palliative medicine consulted for "end of life".     Subjective: Chart reviewed including labs, progress notes, imaging from this and previous encounters.  Evaluated patient.  He continues to be very weak.  He does not answer any of my questions.  He is not eating, drinking or taking medications.  Cortrak is in place.  [name] at bedside. I discussed with Jefm Miles that there hasn't been any improvement in his strength, mental status, or po intake even with the tube feeding.  Discussed he may need permanent PEG tube if continued life prolonging care is desired.  We also discussed code status.  Unfortunately, [facility] cannot share his wishes with [place].  Jefm Miles wishes to discuss privately with his Dad and with his Mom. He feels that patient may be forthcoming if only they are present.    Review of Systems  Unable to perform ROS: Mental status change     Physical Exam Vitals and nursing note reviewed.  Cardiovascular:     Rate and Rhythm: Normal rate.             Vital Signs: BP (!) 119/59 (BP Location: Right Wrist)   Pulse 85   Temp 97.7 F (36.5 C)  (Oral)   Resp 18   Ht 5\' 8"  (1.727 m)   Wt 45.6 kg   SpO2 99%   BMI 15.29 kg/m  SpO2: SpO2: 99 % O2 Device: O2 Device: Room Air O2 Flow Rate: O2 Flow Rate (L/min): 2 L/min  Intake/output summary:  Intake/Output Summary (Last 24 hours) at 06/21/2022 1541 Last data filed at 06/20/2022 2300 Gross per 24 hour  Intake 424 ml  Output --  Net 424 ml  LBM: Last BM Date : 06/19/22 Baseline Weight: Weight: 48.1 kg Most recent weight: Weight: 45.6 kg       Palliative Assessment/Data: PPS: 20%      Patient Active Problem List   Diagnosis Date Noted   Protein-calorie malnutrition, severe 06/12/2022   Influenza A 06/09/2022   Acute respiratory failure with hypoxia ([place]) 06/09/2022   HCAP (healthcare-associated pneumonia) 06/08/2022   Hypertensive urgency 06/08/2022   Hypertension 04/05/2022   Hyperkalemia 04/05/2022   Pulmonary edema 04/05/2022   Volume overload 04/04/2022   Malnutrition of moderate degree 12/26/2021   ESRD (end stage renal disease) ([place]) 12/24/2021   Symptomatic anemia 12/24/2021   History of stroke 12/24/2021   Tobacco abuse 12/24/2021   Alcohol abuse 12/24/2021   Hypoalbuminemia 12/24/2021   AKI (acute kidney injury) ([place]) 12/24/2021   Abdominal distention 12/24/2021   Leg edema 12/24/2021    Palliative Care Assessment & Plan    Assessment/Recommendations/Plan  Continue current care [name] discussing code status and possible PEG with his mother- will f/u either this evening or tomorrow   Code Status: Full code  Prognosis:  Unable to determine  Discharge Planning: To Be Determined  Care plan was discussed with son and care team.   Thank you for allowing the Palliative Medicine Team to assist in the care of this patient.   Greater than 50%  of this time was spent counseling and coordinating care related to the above assessment and plan.  Mariana Kaufman, AGNP-C Palliative Medicine   Please contact Palliative Medicine Team phone at  [phone] for questions and concerns.

## 2022-06-21 NOTE — Progress Notes (Signed)
Speech Language Pathology Treatment: Dysphagia  Patient Details Name: Cody Macdonald MRN: [id number] DOB: [birth year] Today's Date: 06/21/2022 Time: 4097-3532 SLP Time Calculation (min) (ACUTE ONLY): 10 min  Assessment / Plan / Recommendation Clinical Impression  Pt was seen for dysphagia treatment. He was alert during the session and produced a single unintelligible utterance, but no other verbal output was elicited. Anterior spillage of secretions was noted with repositioning suggesting reduced secretion management. Pt's RN reported that significant oral holding and pocketing have been noted with p.o. intake and that he was unable to consume breakfast. With verbal and tactile prompts, pt swallowed a single bolus of puree and throat clearing was noted thereafter; laryngeal invasion suspected. Additional boluses of thin liquids, puree, and ice chips were not orally manipulated and were ultimately suctioned from the oral cavity or spilled anteriorly. An NPO status is recommended at this time with continued use of enteral nutrition and pt may have ice chips after oral care if he is adequately alert and demonstrates oral manipulation thereof. SLP will continue to follow pt.     HPI HPI: Pt is a 61 y.o. male who [facility] to [facility] ED 11/7 with dyspnea, hypertensive urgency, hyperkalemia and was transferred to [facility] for dialysis. CXR 11/8: Progressed bilateral multilobar airspace opacity now with early  consolidation suspected. Differential considerations include  asymmetric pulmonary edema, bilateral pneumonia, developing ARDS. Pt found to have influenza A. Cortrak placed 11/17 due to worsening mentation impacting po. intake.PMH: ESRD on HD MWF, CKD, CVA, HTN, Etoh abuse tobacco abuse, anemia.      SLP Plan  Continue with current plan of care      Recommendations for follow up therapy are one component of a multi-disciplinary discharge planning process, led by the attending physician.  Recommendations may  be updated based on patient status, additional functional criteria and insurance authorization.    Recommendations  Diet recommendations: NPO (pt may have ice chips after oral care if he is alert and demonstrates oral manipulation) Medication Administration: Via alternative means                Oral Care Recommendations: Oral care QID;Staff/trained caregiver to provide oral care;Oral care prior to ice chip/H20 Assistance recommended at discharge: Frequent or constant Supervision/Assistance SLP Visit Diagnosis: Dysphagia, unspecified (R13.10) Plan: Continue with current plan of care          Cody Macdonald I. Hardin Negus, , [place] Office number [phone]  Cody Macdonald  06/21/2022, 9:24 AM

## 2022-06-21 NOTE — Consult Note (Signed)
[facility] Nurse Consult Note: Reason for Consult: worsening sacrum and left foot wounds WOC nursing had not seen patient this admission  Wound type: Deep Tissue Pressure Injury: left distal medial foot Deep Tissue Pressure Injury: left medial foot Deep Tissue Pressure Injury: left medial foot proximal Deep Tissue Pressure Injury: left medial foot  Deep Tissue Pressure Injury:  Pressure Injury POA: No Measurement: Left distal medial foot; first met head; 2cm x 2cm x 0cm  Left medial foot; 0.5cm x 0.5cm x 0cm  Left medial foot; 1cm x 1cm x 0cm  Left medial foot; proximal 2cm x 2cm x 0cm  Left heel 1cm x 1cm x 0.1cm: 100% pink Sacrum; difficult to assess with dark skin tone, appears to be 3cm x 4cm x 0.1cm centrally open and 100% pink with dark purple skin changes that extends 5cm x 6cm x 0cm  Wound bed: see above  Drainage (amount, consistency, odor) none from the foot wounds; scant from the sacrum, no odor  Periwound: intact  Dressing procedure/placement/frequency:  Silicone foam to the left heel Left left medial foot wounds open to air Debridement agent/antimicrobial to the sacrum daily Patient can be turned from side to side; will not add air mattress Prevalon boots in place for offloading heels, however staff need to be cautious when apply straps and foot placement where new areas have appeared.   Made MD and bedside nurse aware of new pressure injuries.   Discussed POC with patient and bedside nurse.  Re consult if needed, will not follow at this time. Thanks  Maryetta Shafer [employer], RN,CWOCN, CNS, [place] [phone])

## 2022-06-22 ENCOUNTER — Inpatient Hospital Stay (HOSPITAL_COMMUNITY): Payer: Medicaid Other

## 2022-06-22 DIAGNOSIS — J9601 Acute respiratory failure with hypoxia: Secondary | ICD-10-CM | POA: Diagnosis not present

## 2022-06-22 DIAGNOSIS — J189 Pneumonia, unspecified organism: Secondary | ICD-10-CM | POA: Diagnosis not present

## 2022-06-22 DIAGNOSIS — N186 End stage renal disease: Secondary | ICD-10-CM | POA: Diagnosis not present

## 2022-06-22 DIAGNOSIS — R627 Adult failure to thrive: Secondary | ICD-10-CM | POA: Diagnosis not present

## 2022-06-22 LAB — MAGNESIUM: Magnesium: 1.9 mg/dL (ref 1.7–2.4)

## 2022-06-22 LAB — CBC WITH DIFFERENTIAL/PLATELET
Abs Immature Granulocytes: 0.03 10*3/uL (ref 0.00–0.07)
Basophils Absolute: 0 10*3/uL (ref 0.0–0.1)
Basophils Relative: 1 %
Eosinophils Absolute: 0.1 10*3/uL (ref 0.0–0.5)
Eosinophils Relative: 1 %
HCT: 25.4 % — ABNORMAL LOW (ref 39.0–52.0)
Hemoglobin: 8.2 g/dL — ABNORMAL LOW (ref 13.0–17.0)
Immature Granulocytes: 1 %
Lymphocytes Relative: 34 %
Lymphs Abs: 1.9 10*3/uL (ref 0.7–4.0)
MCH: 28 pg (ref 26.0–34.0)
MCHC: 32.3 g/dL (ref 30.0–36.0)
MCV: 86.7 fL (ref 80.0–100.0)
Monocytes Absolute: 0.6 10*3/uL (ref 0.1–1.0)
Monocytes Relative: 10 %
Neutro Abs: 3.1 10*3/uL (ref 1.7–7.7)
Neutrophils Relative %: 53 %
Platelets: 235 10*3/uL (ref 150–400)
RBC: 2.93 MIL/uL — ABNORMAL LOW (ref 4.22–5.81)
RDW: 15 % (ref 11.5–15.5)
WBC: 5.7 10*3/uL (ref 4.0–10.5)
nRBC: 0 % (ref 0.0–0.2)

## 2022-06-22 LAB — COMPREHENSIVE METABOLIC PANEL
ALT: 27 U/L (ref 0–44)
AST: 37 U/L (ref 15–41)
Albumin: 1.7 g/dL — ABNORMAL LOW (ref 3.5–5.0)
Alkaline Phosphatase: 71 U/L (ref 38–126)
Anion gap: 12 (ref 5–15)
BUN: 51 mg/dL — ABNORMAL HIGH (ref 6–20)
CO2: 27 mmol/L (ref 22–32)
Calcium: 9.3 mg/dL (ref 8.9–10.3)
Chloride: 95 mmol/L — ABNORMAL LOW (ref 98–111)
Creatinine, Ser: 4.75 mg/dL — ABNORMAL HIGH (ref 0.61–1.24)
GFR, Estimated: 13 mL/min — ABNORMAL LOW (ref >60)
Glucose, Bld: 97 mg/dL (ref 70–99)
Potassium: 3.8 mmol/L (ref 3.5–5.1)
Sodium: 134 mmol/L — ABNORMAL LOW (ref 135–145)
Total Bilirubin: 0.3 mg/dL (ref 0.3–1.2)
Total Protein: 5.9 g/dL — ABNORMAL LOW (ref 6.5–8.1)

## 2022-06-22 LAB — PHOSPHORUS: Phosphorus: 3.5 mg/dL (ref 2.5–4.6)

## 2022-06-22 LAB — GLUCOSE, CAPILLARY
Glucose-Capillary: 104 mg/dL — ABNORMAL HIGH (ref 70–99)
Glucose-Capillary: 111 mg/dL — ABNORMAL HIGH (ref 70–99)
Glucose-Capillary: 85 mg/dL (ref 70–99)
Glucose-Capillary: 98 mg/dL (ref 70–99)
Glucose-Capillary: 99 mg/dL (ref 70–99)

## 2022-06-22 MED ORDER — HYDROXYZINE HCL 10 MG/5ML PO SYRP
25.0000 mg | ORAL_SOLUTION | Freq: Three times a day (TID) | ORAL | Status: DC | PRN
  Administered 2022-06-23: 25 mg
  Filled 2022-06-22 (×2): qty 12.5

## 2022-06-22 MED ORDER — HEPARIN SODIUM (PORCINE) 1000 UNIT/ML IJ SOLN
INTRAMUSCULAR | Status: AC
  Filled 2022-06-22: qty 5

## 2022-06-22 MED ORDER — HEPARIN SODIUM (PORCINE) 1000 UNIT/ML IJ SOLN
2000.0000 [IU] | Freq: Once | INTRAMUSCULAR | Status: AC
  Administered 2022-06-22: 2000 [IU] via INTRAVENOUS

## 2022-06-22 NOTE — Progress Notes (Signed)
Palliative-   Chart reviewed.  Patient off floor for HD, unable to assess- per chart no change in mental status.  Called his son Cody Macdonald for f/u.  Cody Macdonald shares per his discussion with Cody Macdonald and Cody Macdonald- they would like to change Cody Macdonald's code status to DNR.  Further, they would like to proceed with PEG.   Mariana Kaufman, AGNP-C Palliative Medicine  No charge

## 2022-06-22 NOTE — Plan of Care (Signed)
Pt went to dialysis today. Pt has had BM since 11/18; PRN colace and miralax given. DNR bracelet placed on pt's wrist. This evening pt started having respirations in the 40s; Sheikh notified; chest xray ordered.  Problem: Clinical Measurements: Goal: Ability to maintain clinical measurements within normal limits will improve Outcome: Progressing Goal: Will remain free from infection Outcome: Progressing Goal: Diagnostic test results will improve Outcome: Progressing Goal: Cardiovascular complication will be avoided Outcome: Progressing   Problem: Activity: Goal: Risk for activity intolerance will decrease Outcome: Progressing   Problem: Nutrition: Goal: Adequate nutrition will be maintained Outcome: Progressing   Problem: Coping: Goal: Level of anxiety will decrease Outcome: Progressing   Problem: Elimination: Goal: Will not experience complications related to urinary retention Outcome: Progressing   Problem: Pain Managment: Goal: General experience of comfort will improve Outcome: Progressing   Problem: Safety: Goal: Ability to remain free from injury will improve Outcome: Progressing   Problem: Skin Integrity: Goal: Risk for impaired skin integrity will decrease Outcome: Progressing   Problem: Health Behavior/Discharge Planning: Goal: Ability to manage health-related needs will improve Outcome: Not Progressing   Problem: Clinical Measurements: Goal: Respiratory complications will improve Outcome: Not Progressing   Problem: Elimination: Goal: Will not experience complications related to bowel motility Outcome: Not Progressing

## 2022-06-22 NOTE — Procedures (Signed)
I was present at this dialysis session. I have reviewed the session itself and made appropriate changes.   3K bath with UF goal of 1L.  Pt without complaint but not very engaging.    Filed Weights   06/19/22 0413 06/20/22 0356 06/21/22 0023  Weight: 44.2 kg 44.5 kg 45.6 kg    Recent Labs  Lab 06/22/22 0024  NA 134*  K 3.8  CL 95*  CO2 27  GLUCOSE 97  BUN 51*  CREATININE 4.75*  CALCIUM 9.3  PHOS 3.5    Recent Labs  Lab 06/20/22 0629 06/21/22 0017 06/22/22 0024  WBC 4.8 4.7 5.7  NEUTROABS 3.0 2.6 3.1  HGB 8.1* 7.5* 8.2*  HCT 25.7* 23.2* 25.4*  MCV 88.3 87.9 86.7  PLT 223 210 235    Scheduled Meds:  aspirin  81 mg Per Tube Daily   Chlorhexidine Gluconate Cloth  6 each Topical Q0600   clopidogrel  75 mg Per Tube Daily   [START ON 06/24/2022] darbepoetin (ARANESP) injection - DIALYSIS  100 mcg Subcutaneous Q Thu-1800   diclofenac Sodium  4 g Topical Once   famotidine  20 mg Per Tube Daily   feeding supplement (PROSource TF20)  60 mL Per Tube Daily   heparin  5,000 Units Subcutaneous Q8H   hydrALAZINE  50 mg Per Tube Q8H   labetalol  300 mg Per Tube BID   leptospermum manuka honey  1 Application Topical Daily   multivitamin  1 tablet Per Tube QHS   mouth rinse  15 mL Mouth Rinse 4 times per day   Continuous Infusions:  feeding supplement (NEPRO CARB STEADY) 40 mL/hr at 06/22/22 0000   PRN Meds:.acetaminophen, acetaminophen, albuterol, docusate sodium, guaiFENesin, hydrALAZINE, labetalol, mouth rinse, polyethylene glycol   Pearson Grippe  MD 06/22/2022, 9:23 AM

## 2022-06-22 NOTE — Progress Notes (Signed)
Received patient in bed to unit.  Alert flat affect follows commands intermittently Informed consent signed and in chart.   Treatment initiated:0915 Treatment completed: 1400  Patient tolerated well. Changed HD cartridge set x 1 d/t elevated A/V alarm pressures and system clotting Transported back to the room  Alert, without acute distress.  Hand-off given to patient's nurse.   Access used: fistula left arm Access issues: none  Total UF removed: 800 ml Medication(s) given: heparin 2000units Post HD weight:    [facility]  06/22/22 1400  Vitals  Temp 98.2 F (36.8 C)  BP 116/61  MAP (mmHg) 77  Resp 15  Oxygen Therapy  SpO2 100 %  O2 Device Room Air  Post Treatment  Dialyzer Clearance Clear  Duration of HD Treatment -hour(s) 3.5 hour(s)  Liters Processed 84  Fluid Removed (mL) 800 mL  Tolerated HD Treatment Yes  AVG/AVF Arterial Site Held (minutes) 10 minutes  AVG/AVF Venous Site Held (minutes) 10 minutes

## 2022-06-22 NOTE — Progress Notes (Signed)
PROGRESS NOTE    Cody Macdonald  PPJ:[id number] DOB: 06-25-1961 DOA: 06/08/2022 PCP: Merryl Hacker, No   Brief Narrative:  Cody Macdonald is an 61 y.o. male past medical history of stroke, essential hypertension end-stage renal disease on hemodialysis Monday Wednesday and Friday sent from skilled nursing facility due to dyspnea hypertensive urgency and hyperkalemia requiring 10 L high flow nasal cannula in the ED chest x-ray showing pulmonary edema SARS-CoV-2 PCR was negative.  Admitted initially to the ICU nephrology was consulted for emergent dialysis, started on Cleviprex and empiric antibiotic's vancomycin and cefepime.  [facility] assumed care on 06/11/2022.  He continues to get dialysis and oxygen requirement is weaning and he was on 4 L.  Palliative is involved for goals of care discussion and he remains full code as patient's son has not made a decision yet given that he wishes to include few other family members and the shared decision-making.  Patient's son was agreeable to a Cortrak so this has been initiated and he is getting tube feedings at 10 MLS per hour and will advance by 10 mL to goal rate of 40 mL/h.  Respiratory status and mental is about the same and patient continued to be withdrawn.    06/20/2022: Dialysis today and continues to get core track feedings.  Did not really interact again   Started developing some new areas of pressure wounds given his poor nutritional status and lack of mobility.  Palliative care continued to have goals of care discussion and patient's son to decide with the family about PEG tube feedings given that he is not very interactive and because he continues to not eat well.  SLP evaluated and recommending continuing n.p.o.   Palliative care now discussing about PEG tube feeding placement given that patient is not taking anything orally well.  Palliative met with son at bedside and there has been any improvement in his strength, mental status or even his p.o. intake  with the tube feeding.  After further goals of care discussion family has elected to change the patient's CODE STATUS to DNR but have also elected to pursue a PEG tube placement so IR has been consulted for further evaluation.  Assessment and Plan:  Acute respiratory failure with hypoxia secondary to to influenza A infection r/o Other infection -SpO2: 100 % O2 Flow Rate (L/min): 2 L/min FiO2 (%): (!) 4 %'; Plan to wean his oxygen down in the next 24 hours if able.  -On Droplet Precautions but can be D/C'd if Infection Prevention has no objections  -Procalcitonin Level was >150.00 -Lactic Acid Level went from 1.5 -> 1.2 -Received oseltamavir 30 mg p.o. daily for 3 days -Blood cultures x2 on 06/14/2022 showed no growth to date 5 days -Empirically on broad spectrum IV antibiotics with IV Cefepime and IV vancomycin given his continued spiking temperatures.  Discontinued yesterday given that he has received 10 days total but still spiked a temperature and had Tmax of 100.6 in the last 24 hours; if continues to have temperatures may need to reinitiate antibiotics and may need to have ID assistance -C/w Albuterol 2.5 mg q3hprn Wheezing -Chest x-ray done yesterday showed " Perihilar interstitial airspace opacities consistent with pulmonary edema versus volume overload. Well-positioned right IJ tunneled hemodialysis catheter." -CXR yesterday showed "Right IJ catheter tip is in the projection of the distal SVC. There is a feeding tube with tip below the level of the hemidiaphragms. Stable cardiomediastinal contours. Lungs appear hyperinflated" -Repeat chest x-ray intermittently and will repeat in  the morning   Hypertensive Urgency improved significantly and now has a softer blood pressure -BP parameters have improved.  -C/w Labetalol 300 mg po BID and Labetalol 10 mg IV q31mn PRN High BP -C/w Hydralazine 50 mg po q8h and with IV Hydralazine 10-40 mg q4hprn  -Continue to Monitor BP per Protocol -Last  BP is now on the softer side 116/61   ESRD on HD.  Elevated anion gap Hyperphosphatemia hypophosphatemic -Further management as per nephrology. -Patient's BUNs/creatinine went from 55/5.36 -> 39/5.22 -> 28/3.89 -> 49/5.59 -> 31/3.62 -> 51/4.80 and is now 35/3.41 yesterday and today is now 51/4.75 -Patient's anion gap is now 12, chloride level is 95 and CO2 is 27 -Phos level is now 6.8 yesterday and then trended up to 7.1 but is now 3.4 -Avoid further nephrotoxic medications, contrast dyes, hypotension and dehydration to ensure adequate renal perfusion -Underwent dialysis the day before yesterday and will be going to dialysis today -Repeat CMP in a.m.   Hypophosphatemia, hypokalemia and hypomagnesemia -Likely to be corrected in dialysis and this is now improved -Potassium is now 3.8, phosphorus level is now 3.5, magnesium is now 1.9 -Continue to monitor trend and repeat CMP, mag and Phos in the a.m.   Poor p.o. intake -Has been refusing to eat and not really eating very much -IVF now stopped -Consult nutrition for further evaluation recommendations and Family ok with Trial of Cortrak and now going to be placed.;  See below   H/o CVA -On Aspirin and Clopidogrel , on dysphagia  1 diet but not eating very well   Anemia of Chronic Kidney Disease  -Stable -Hemoglobin/hematocrit went from 9.3/29.1 -> 8.5/26.6 -> 9.4/29.0 and is now 9.6/30.2 -> 8.7/27.5 -> 8.2/25.7 -> 8.1/25.7 -> 7.5/23.2 -> 8.2/25.4 -Checked Anemia Panel and showed an iron level 18, UIBC 129, TIBC 147, saturation ratios of 12%, ferritin level 711, folate level 9.8, vitamin B12 952 -We will continue to monitor for signs and symptoms of bleeding; no overt bleeding noted -Repeat CBC in a.m.   Hypoalbuminemia -Patient albumin level was 1.7 on last check -Continue monitor and trend and repeat CMP in the AM   Acute Metabolic Encephalopathy -Probably sec to influenza A infection, improving mental status today. More alert and  shaking head yes and no to questions but did not respond to any of my questioning -Speech evaluated the patient recommended dysphagia 1 diet. -Changed antibiotics to IV Vanco and cefepime given that he continued to spike fevers but has received 10 days of Abx and will stop  -He is awake and but did not answer any questions shaking his head yes or no and he just stared at me most of the time   Abnormal LFTs -Patient's AST was 312 -> 79 -> 58 -> 52 -> 44 and is now 37 again and AST went from 97 -> 47 -> 39 -> 35 -> 33 -> 29 and is now 27 -Continue to monitor and trend and repeat CMP in the a.m. if necessary will obtain a right upper quadrant ultrasound as well as a acute hepatitis panel -Repeat CMP in a.m.   Underweight/ Severe Malnutrition in the Context of Chronic Illness/cachexia -Nutrition Status: Nutrition Problem: Severe Malnutrition Etiology: chronic illness (ESRD on HD) Signs/Symptoms: severe muscle depletion, severe fat depletion Interventions: Refer to RD note for recommendations -Estimated body mass index is 14.51 kg/m as calculated from the following:   Height as of this encounter: _0  (1.727 m).   Weight as of this encounter: 43.3  kg. -Plan is to trial a Cortrak and initiate Nepro mL per hour with 10 mL advancement every 24 hours for goal of 40 MLS per hour plus PS x1 and continuing dysphagia 1 diet and Ensure twice daily; Cortrak was placed 06/18/22 -Family now agreeable to PEG tube placement and interventional radiology consulted   Pressure Ulcers, not present on admission -[place] Nurse consulted and recommendations made -The wound care nurse has recommended a silicone foam to the left ear as well as leave the left medial foot wounds open to the air as well as debridement agent and antimicrobial to sacrum daily and they are recommending the patient can be turned side to side and they are not can add an air mattress.  The wound care nurse also recommend Prevalon boots in place for  offloading heels but recommended the staff needing to be cautious when applying straps and a foot placement when the areas have appeared. -Poor Prognosis given his lack of oral intake and nutritional status but palliative care has discussed goals of care and family has changed to DNR but want to pursue a PEG tube   DVT prophylaxis: heparin injection 5,000 Units Start: 06/08/22 2200 SCDs Start: 06/08/22 1543    Code Status: DNR Family Communication: No family present at bedside   Disposition Plan:  Level of care: Progressive Status is: Inpatient Remains inpatient appropriate because: We will be getting PEG tube placement per IR schedule and will need further clinical improvement prior to safe discharge disposition before going back to SNF   Consultants:  Nephrology Palliative care medicine Interventional radiology  Procedures:  As delineated above  Antimicrobials:  Anti-infectives (From admission, onward)    Start     Dose/Rate Route Frequency Ordered Stop   06/15/22 0900  ceFEPIme (MAXIPIME) 2 g in sodium chloride 0.9 % 100 mL IVPB        2 g 200 mL/hr over 30 Minutes Intravenous  Once 06/15/22 0804 06/15/22 1044   06/14/22 1200  ceFEPIme (MAXIPIME) 2 g in sodium chloride 0.9 % 100 mL IVPB  Status:  Discontinued        2 g 200 mL/hr over 30 Minutes Intravenous Every M-W-F (Hemodialysis) 06/14/22 1008 06/18/22 1429   06/14/22 1200  vancomycin (VANCOREADY) IVPB 500 mg/100 mL  Status:  Discontinued        500 mg 100 mL/hr over 60 Minutes Intravenous Every M-W-F (Hemodialysis) 06/14/22 1008 06/18/22 1429   06/14/22 1100  vancomycin (VANCOCIN) IVPB 1000 mg/200 mL premix        1,000 mg 200 mL/hr over 60 Minutes Intravenous  Once 06/14/22 1000 06/14/22 1600   06/14/22 1100  ceFEPIme (MAXIPIME) 2 g in sodium chloride 0.9 % 100 mL IVPB        2 g 200 mL/hr over 30 Minutes Intravenous  Once 06/14/22 1000 06/14/22 1259   06/12/22 2000  oseltamivir (TAMIFLU) capsule 30 mg        30 mg  Oral  Once 06/12/22 1440 06/12/22 2149   06/10/22 2100  oseltamivir (TAMIFLU) capsule 30 mg        30 mg Oral  Once 06/10/22 0825 06/11/22 0517   06/10/22 1030  cefTRIAXone (ROCEPHIN) 2 g in sodium chloride 0.9 % 100 mL IVPB  Status:  Discontinued        2 g 200 mL/hr over 30 Minutes Intravenous Every 24 hours 06/10/22 0945 06/14/22 1000   06/09/22 2200  ceFEPIme (MAXIPIME) 1 g in sodium chloride 0.9 % 100 mL IVPB  Status:  Discontinued        1 g 200 mL/hr over 30 Minutes Intravenous Every 24 hours 06/08/22 1611 06/10/22 0945   06/09/22 1015  oseltamivir (TAMIFLU) capsule 30 mg        30 mg Oral  Once 06/09/22 0920 06/09/22 1045   06/08/22 1830  oseltamivir (TAMIFLU) capsule 30 mg  Status:  Discontinued        30 mg Oral  Once 06/08/22 1818 06/09/22 0920   06/08/22 1612  vancomycin variable dose per unstable renal function (pharmacist dosing)  Status:  Discontinued         Does not apply See admin instructions 06/08/22 1612 06/09/22 0920   06/08/22 1100  vancomycin (VANCOCIN) IVPB 1000 mg/200 mL premix        1,000 mg 200 mL/hr over 60 Minutes Intravenous  Once 06/08/22 1057 06/08/22 1329   06/08/22 1100  ceFEPIme (MAXIPIME) 2 g in sodium chloride 0.9 % 100 mL IVPB        2 g 200 mL/hr over 30 Minutes Intravenous  Once 06/08/22 1057 06/08/22 1225        Subjective: Seen and examined at dialysis unit and he is still withdrawn but did answer some questions yes or no to me.  Denied any pain.  Family has elected for PEG tube placement so interventional radiology was consulted.  No acute changes overnight and respiratory status appears stable.  Objective: Vitals:   06/22/22 1000 06/22/22 1030 06/22/22 1100 06/22/22 1212  BP: 115/67 107/70 106/72 (!) 97/57  Pulse: 90 90 91   Resp: (!) 26 19 (!) 37 (!) 29  Temp:      TempSrc:      SpO2: 99% 100% 99%   Weight:      Height:        Intake/Output Summary (Last 24 hours) at 06/22/2022 1416 Last data filed at 06/22/2022 0000 Gross per  24 hour  Intake 845 ml  Output --  Net 845 ml   Filed Weights   06/19/22 0413 06/20/22 0356 06/21/22 0023  Weight: 44.2 kg 44.5 kg 45.6 kg   Examination: Physical Exam:  Constitutional: The patient is a very cachectic African-American male who is laying in the dialysis bed appears calm Respiratory: Diminished to auscultation bilaterally with coarse breath sounds, no wheezing, rales, rhonchi or crackles. Normal respiratory effort and patient is not tachypenic. No accessory muscle use.  Unlabored breathing Cardiovascular: RRR, no murmurs / rubs / gallops. S1 and S2 auscultated. No extremity edema.  Abdomen: Soft, non-tender, non-distended. Bowel sounds positive.  GU: Deferred. Musculoskeletal: No clubbing / cyanosis of digits/nails. No joint deformity upper and lower extremities. Skin: Multiple extremity many wounds and pressure ulcers and he is in the Prevalon boots Neurologic: He is awake and alert and tracks with his eyes but does not really speak and does answer some questions yes or no Psychiatric: Impaired judgment and insight  Data Reviewed: I have personally reviewed following labs and imaging studies  CBC: Recent Labs  Lab 06/18/22 0544 06/19/22 0626 06/20/22 0629 06/21/22 0017 06/22/22 0024  WBC 5.0 4.3 4.8 4.7 5.7  NEUTROABS 3.3 3.1 3.0 2.6 3.1  HGB 8.7* 8.2* 8.1* 7.5* 8.2*  HCT 27.5* 25.7* 25.7* 23.2* 25.4*  MCV 89.0 88.9 88.3 87.9 86.7  PLT 238 203 223 210 326   Basic Metabolic Panel: Recent Labs  Lab 06/18/22 0544 06/18/22 1647 06/19/22 0626 06/19/22 1756 06/20/22 0629 06/21/22 0017 06/22/22 0024  NA 140  --  138  --  135 135 134*  K 4.4  --  3.5  --  3.7 3.4* 3.8  CL 97*  --  99  --  96* 95* 95*  CO2 23  --  25  --  _0 GLUCOSE 86  --  83  --  101* 89 97  BUN 49*  --  31*  --  51* 35* 51*  CREATININE 5.59*  --  3.62*  --  4.80* 3.41* 4.75*  CALCIUM 9.4  --  8.9  --  9.1 9.0 9.3  MG 2.0   < > 1.7 1.6* 1.8 1.6* 1.9  PHOS 6.8*   < > 3.4 3.8  3.6 2.2* 3.5   < > = values in this interval not displayed.   GFR: Estimated Creatinine Clearance: 10.7 mL/min (A) (by C-G formula based on SCr of 4.75 mg/dL (H)). Liver Function Tests: Recent Labs  Lab 06/18/22 0544 06/19/22 0626 06/20/22 0629 06/21/22 0017 06/22/22 0024  AST 58* 52* 52* 44* 37  ALT 39 35 33 29 27  ALKPHOS 62 63 65 62 71  BILITOT 0.6 0.6 0.4 0.4 0.3  PROT 6.6 6.3* 6.2* 6.1* 5.9*  ALBUMIN 2.0* 1.9* 1.8* 1.7* 1.7*   No results for input(s): "LIPASE", "AMYLASE" in the last 168 hours. No results for input(s): "AMMONIA" in the last 168 hours. Coagulation Profile: No results for input(s): "INR", "PROTIME" in the last 168 hours. Cardiac Enzymes: No results for input(s): "CKTOTAL", "CKMB", "CKMBINDEX", "TROPONINI" in the last 168 hours. BNP (last 3 results) No results for input(s): "PROBNP" in the last 8760 hours. HbA1C: No results for input(s): "HGBA1C" in the last 72 hours. CBG: Recent Labs  Lab 06/21/22 1558 06/21/22 2010 06/22/22 0018 06/22/22 0404 06/22/22 0810  GLUCAP 100* 99 98 99 85   Lipid Profile: No results for input(s): "CHOL", "HDL", "LDLCALC", "TRIG", "CHOLHDL", "LDLDIRECT" in the last 72 hours. Thyroid Function Tests: No results for input(s): "TSH", "T4TOTAL", "FREET4", "T3FREE", "THYROIDAB" in the last 72 hours. Anemia Panel: Recent Labs    06/21/22 0017  VITAMINB12 952*  FOLATE 9.8  FERRITIN 711*  TIBC 147*  IRON 18*  RETICCTPCT 0.6   Sepsis Labs: No results for input(s): "PROCALCITON", "LATICACIDVEN" in the last 168 hours.  Recent Results (from the past 240 hour(s))  Culture, blood (Routine X 2) w Reflex to ID Panel     Status: None   Collection Time: 06/14/22 10:26 AM   Specimen: BLOOD  Result Value Ref Range Status   Specimen Description   Final    BLOOD BLOOD RIGHT ARM AEROBIC BOTTLE ONLY ANAEROBIC BOTTLE ONLY   Special Requests   Final    BOTTLES DRAWN AEROBIC AND ANAEROBIC Blood Culture adequate volume   Culture    Final    NO GROWTH 5 DAYS Performed at [facility], [address], [place], [place] [address]    Report Status 06/19/2022 FINAL  Final  Culture, blood (Routine X 2) w Reflex to ID Panel     Status: None   Collection Time: 06/14/22 10:26 AM   Specimen: BLOOD RIGHT ARM  Result Value Ref Range Status   Specimen Description   Final    BLOOD RIGHT ARM AEROBIC BOTTLE ONLY ANAEROBIC BOTTLE ONLY   Special Requests   Final    BOTTLES DRAWN AEROBIC AND ANAEROBIC Blood Culture adequate volume   Culture   Final    NO GROWTH 5 DAYS Performed at [facility], [place] [address], [place], [place]  [address]    Report Status 06/19/2022 FINAL  Final     Radiology Studies: DG CHEST PORT 1 VIEW  Result Date: 06/21/2022 CLINICAL DATA:  Shortness of breath. In stage kidney disease. Hypertension. Stroke EXAM: PORTABLE CHEST 1 VIEW COMPARISON:  06/20/2022 FINDINGS: Right IJ catheter tip is in the projection of the distal SVC. There is a feeding tube with tip below the level of the hemidiaphragms. Stable cardiomediastinal contours. Lungs appear hyperinflated. IMPRESSION: No active disease. Electronically Signed   By: Kerby Moors M.D.   On: 06/21/2022 05:47    Scheduled Meds:  aspirin  81 mg Per Tube Daily   Chlorhexidine Gluconate Cloth  6 each Topical Q0600   clopidogrel  75 mg Per Tube Daily   [START ON 06/24/2022] darbepoetin (ARANESP) injection - DIALYSIS  100 mcg Subcutaneous Q Thu-1800   diclofenac Sodium  4 g Topical Once   famotidine  20 mg Per Tube Daily   feeding supplement (PROSource TF20)  60 mL Per Tube Daily   heparin  5,000 Units Subcutaneous Q8H   heparin sodium (porcine)       hydrALAZINE  50 mg Per Tube Q8H   labetalol  300 mg Per Tube BID   leptospermum manuka honey  1 Application Topical Daily   multivitamin  1 tablet Per Tube QHS   mouth rinse  15 mL Mouth Rinse 4 times per day   Continuous Infusions:  feeding supplement (NEPRO CARB STEADY) 40 mL/hr at 06/22/22  0000    LOS: 14 days   Raiford Noble, DO [facility] Available via Epic secure chat 7am-7pm After these hours, please refer to coverage provider listed on [email or web address] 06/22/2022, 2:16 PM

## 2022-06-22 NOTE — Progress Notes (Signed)
Pt doesn't seem in distress. MD notified; chest xray ordered.   06/22/22 1732  Assess: MEWS Score  Pulse Rate 92  ECG Heart Rate 92  Resp (!) 43  SpO2 100 %  Assess: MEWS Score  MEWS Temp 0  MEWS Systolic 0  MEWS Pulse 0  MEWS RR 3  MEWS LOC 0  MEWS Score 3  MEWS Score Color Yellow  Assess: if the MEWS score is Yellow or Red  Were vital signs taken at a resting state? Yes  Focused Assessment No change from prior assessment  Does the patient meet 2 or more of the SIRS criteria? Yes  Does the patient have a confirmed or suspected source of infection? No  Provider and Rapid Response Notified? No  MEWS guidelines implemented *See [place] Yes  Provider Notification  Provider Name/Title Alfredia Ferguson MD  Date Provider Notified 06/22/22  Time Provider Notified 1745  Method of Notification Page  Notification Reason Change in status  Provider response See new orders  Assess: SIRS CRITERIA  SIRS Temperature  0  SIRS Pulse 1  SIRS Respirations  1  SIRS WBC 1  SIRS Score Sum  3

## 2022-06-23 ENCOUNTER — Inpatient Hospital Stay (HOSPITAL_COMMUNITY): Payer: Medicaid Other

## 2022-06-23 DIAGNOSIS — J189 Pneumonia, unspecified organism: Secondary | ICD-10-CM | POA: Diagnosis not present

## 2022-06-23 LAB — GLUCOSE, CAPILLARY
Glucose-Capillary: 104 mg/dL — ABNORMAL HIGH (ref 70–99)
Glucose-Capillary: 104 mg/dL — ABNORMAL HIGH (ref 70–99)
Glucose-Capillary: 110 mg/dL — ABNORMAL HIGH (ref 70–99)
Glucose-Capillary: 111 mg/dL — ABNORMAL HIGH (ref 70–99)
Glucose-Capillary: 116 mg/dL — ABNORMAL HIGH (ref 70–99)
Glucose-Capillary: 119 mg/dL — ABNORMAL HIGH (ref 70–99)

## 2022-06-23 LAB — CBC WITH DIFFERENTIAL/PLATELET
Abs Immature Granulocytes: 0.04 10*3/uL (ref 0.00–0.07)
Basophils Absolute: 0 10*3/uL (ref 0.0–0.1)
Basophils Relative: 1 %
Eosinophils Absolute: 0.2 10*3/uL (ref 0.0–0.5)
Eosinophils Relative: 3 %
HCT: 26 % — ABNORMAL LOW (ref 39.0–52.0)
Hemoglobin: 8.1 g/dL — ABNORMAL LOW (ref 13.0–17.0)
Immature Granulocytes: 1 %
Lymphocytes Relative: 31 %
Lymphs Abs: 1.8 10*3/uL (ref 0.7–4.0)
MCH: 27.3 pg (ref 26.0–34.0)
MCHC: 31.2 g/dL (ref 30.0–36.0)
MCV: 87.5 fL (ref 80.0–100.0)
Monocytes Absolute: 0.6 10*3/uL (ref 0.1–1.0)
Monocytes Relative: 10 %
Neutro Abs: 3.3 10*3/uL (ref 1.7–7.7)
Neutrophils Relative %: 54 %
Platelets: 283 10*3/uL (ref 150–400)
RBC: 2.97 MIL/uL — ABNORMAL LOW (ref 4.22–5.81)
RDW: 15.3 % (ref 11.5–15.5)
WBC: 6 10*3/uL (ref 4.0–10.5)
nRBC: 0 % (ref 0.0–0.2)

## 2022-06-23 LAB — COMPREHENSIVE METABOLIC PANEL
ALT: 25 U/L (ref 0–44)
AST: 46 U/L — ABNORMAL HIGH (ref 15–41)
Albumin: 1.7 g/dL — ABNORMAL LOW (ref 3.5–5.0)
Alkaline Phosphatase: 75 U/L (ref 38–126)
Anion gap: 9 (ref 5–15)
BUN: 35 mg/dL — ABNORMAL HIGH (ref 6–20)
CO2: 30 mmol/L (ref 22–32)
Calcium: 9 mg/dL (ref 8.9–10.3)
Chloride: 96 mmol/L — ABNORMAL LOW (ref 98–111)
Creatinine, Ser: 3.28 mg/dL — ABNORMAL HIGH (ref 0.61–1.24)
GFR, Estimated: 21 mL/min — ABNORMAL LOW (ref >60)
Glucose, Bld: 107 mg/dL — ABNORMAL HIGH (ref 70–99)
Potassium: 3.8 mmol/L (ref 3.5–5.1)
Sodium: 135 mmol/L (ref 135–145)
Total Bilirubin: 0.1 mg/dL — ABNORMAL LOW (ref 0.3–1.2)
Total Protein: 6.2 g/dL — ABNORMAL LOW (ref 6.5–8.1)

## 2022-06-23 LAB — PHOSPHORUS: Phosphorus: 2.4 mg/dL — ABNORMAL LOW (ref 2.5–4.6)

## 2022-06-23 LAB — MAGNESIUM: Magnesium: 1.7 mg/dL (ref 1.7–2.4)

## 2022-06-23 MED ORDER — JEVITY 1.5 CAL/FIBER PO LIQD
1000.0000 mL | ORAL | Status: DC
  Administered 2022-06-23 – 2022-06-29 (×5): 1000 mL
  Filled 2022-06-23 (×9): qty 1000

## 2022-06-23 NOTE — Progress Notes (Signed)
PROGRESS NOTE  Cody Macdonald UMP:[id number] DOB: 03/31/1961 DOA: 06/08/2022 PCP: Pcp, No   LOS: 15 days   Brief Narrative / Interim history: 61 year old male with CVA, HTN, ESRD on HD MWF, sent from his SNF due to shortness of breath and hypertensive urgency.  He was hypoxic requiring up to 10 L nasal cannula and had pulmonary edema on the chest x-ray.  He was initially in the ICU, underwent emergent dialysis and required Cleviprex infusion for hypertensive urgency.  With improvement he was transferred to the hospitalist service on 11/10.  Hospital course complicated by poor mentation, persistent encephalopathy and failed a swallow eval.  Palliative care was consulted and multiple GOC were held, he is now DNR and family wishes for PEG tube which replaced next week.  Subjective / 24h Interval events: Alert, his eyes are open, does not interact much.  Assesement and Plan: Principal Problem:   HCAP (healthcare-associated pneumonia) Active Problems:   ESRD (end stage renal disease) ([place])   Hypertensive urgency   Influenza A   Acute respiratory failure with hypoxia (HCC)   Protein-calorie malnutrition, severe  Principal problem Acute hypoxic respiratory failure due to multifocal pneumonia due to influenza A as well as superimposed acute pulmonary edema in the setting of fluid overload in a dialysis patient-respiratory status overall improving, currently he is on room air.  He completed a course of Tamiflu as well as empiric antibiotics.  He is afebrile now, WBC normal, monitor off antibiotics, he completed a 10-day course on 11/17.  Active problems  Hypertensive Urgency -patient required to be in ICU on a Cleviprex drip, currently on labetalol, hydralazine with improvement in his blood pressure.  ESRD on HD-management as per nephrology.  Hypophosphatemia, hypokalemia and hypomagnesemia -Likely to be corrected in dialysis and this is now improved.  Continue to monitor and trend   Poor  p.o. intake -Has been refusing to eat and not really eating very much.  PEG tube next week due to long holiday   H/o CVA -On Aspirin and Clopidogrel , on dysphagia 1 diet but not eating very well   Anemia of Chronic Kidney Disease -Stable   Hypoalbuminemia -noted   Acute Metabolic Encephalopathy-Probably sec to influenza A infection, overall proving but still does not engage much on my evaluation   Abnormal LFTs -minimal, improving   Underweight/ Severe Malnutrition in the Context of Chronic Illness/cachexia -on tube feeds  Pressure Ulcers, not present on admission -WOC RN consulted. Continue silicone foam to the left ear as well as leave the left medial foot wounds open to the air as well as debridement agent and antimicrobial to sacrum daily and they are recommending the patient can be turned side to side and they are not can add an air mattress.  The wound care nurse also recommend Prevalon boots in place for offloading heels but recommended the staff needing to be cautious when applying straps and a foot placement when the areas have appeared.  Scheduled Meds:  aspirin  81 mg Per Tube Daily   Chlorhexidine Gluconate Cloth  6 each Topical Q0600   clopidogrel  75 mg Per Tube Daily   [START ON 06/24/2022] darbepoetin (ARANESP) injection - DIALYSIS  100 mcg Subcutaneous Q Thu-1800   diclofenac Sodium  4 g Topical Once   famotidine  20 mg Per Tube Daily   feeding supplement (PROSource TF20)  60 mL Per Tube Daily   heparin  5,000 Units Subcutaneous Q8H   hydrALAZINE  50 mg Per Tube Q8H  labetalol  300 mg Per Tube BID   leptospermum manuka honey  1 Application Topical Daily   multivitamin  1 tablet Per Tube QHS   mouth rinse  15 mL Mouth Rinse 4 times per day   Continuous Infusions:  feeding supplement (NEPRO CARB STEADY) 40 mL/hr at 06/22/22 0000   PRN Meds:.acetaminophen, acetaminophen, albuterol, docusate sodium, guaiFENesin, hydrALAZINE, hydrOXYzine, labetalol, mouth rinse,  polyethylene glycol  Current Outpatient Medications  Medication Instructions   acetaminophen (TYLENOL) 650 mg, Oral, Every 6 hours PRN   albuterol (PROVENTIL) 2.5 mg, Nebulization, Every 6 hours PRN   amLODipine (NORVASC) 10 mg, Oral, Daily   aspirin EC 81 mg, Oral, Daily, Swallow whole.   atorvastatin (LIPITOR) 80 mg, Oral, Daily at bedtime   clopidogrel (PLAVIX) 75 mg, Oral, Daily   ergocalciferol (VITAMIN D2) 50,000 Units, Every Tue   feeding supplement (ENSURE ENLIVE / ENSURE PLUS) LIQD 237 mLs, Oral, 2 times daily between meals   hydrALAZINE (APRESOLINE) 50 mg, Oral, Every 8 hours   labetalol (NORMODYNE) 300 mg, Oral, 2 times daily   melatonin 3 mg, Oral, Daily at bedtime   MULTIPLE VITAMIN PO 1 tablet, Oral, Daily at bedtime   multivitamin (RENA-VIT) TABS tablet 1 tablet, Oral, Daily at bedtime   Nutritional Supplements (NEPRO PO) Oral, 2 times daily, AHR Nepro   pantoprazole (PROTONIX) 40 mg, Oral, Daily   sevelamer carbonate (RENVELA) 800 mg, Oral, 3 times daily with meals   UNABLE TO FIND 30 mLs, Oral, 2 times daily, Liquid protein     Diet Orders (From admission, onward)     Start     Ordered   06/21/22 0919  Diet NPO time specified Except for: Ice Chips  Diet effective now       Comments: After oral care if pt is alert  Question:  Except for  Answer:  Ice Chips   06/21/22 0918            DVT prophylaxis: heparin injection 5,000 Units Start: 06/08/22 2200 SCDs Start: 06/08/22 1543   Lab Results  Component Value Date   PLT 283 06/23/2022      Code Status: DNR  Family Communication: no family at bedside   Status is: Inpatient  Remains inpatient appropriate because: severity of illness  Level of care: Progressive  Consultants:  Palliative PCCM  Objective: Vitals:   06/23/22 0500 06/23/22 0541 06/23/22 0737 06/23/22 1003  BP:  102/64 120/64 (!) 115/59  Pulse:   96 98  Resp:   20 (!) 21  Temp:   98.8 F (37.1 C)   TempSrc:   Axillary   SpO2:    98% 97%  Weight: 45.5 kg     Height:        Intake/Output Summary (Last 24 hours) at 06/23/2022 1044 Last data filed at 06/23/2022 1015 Gross per 24 hour  Intake 1606.66 ml  Output 860 ml  Net 746.66 ml   Wt Readings from Last 3 Encounters:  06/23/22 45.5 kg  04/12/22 48.2 kg  01/01/22 52.3 kg    Examination:  Constitutional: NAD Eyes: no scleral icterus ENMT: Mucous membranes are moist.  Neck: normal, supple Respiratory: clear to auscultation bilaterally, no wheezing, no crackles. Normal respiratory effort. No accessory muscle use.  Cardiovascular: Regular rate and rhythm, no murmurs / rubs / gallops. No LE edema.  Abdomen: non distended, no tenderness. Bowel sounds positive.  Musculoskeletal: no clubbing / cyanosis.  Skin: no rashes  Data Reviewed: I have independently reviewed following labs  and imaging studies   CBC Recent Labs  Lab 06/19/22 0626 06/20/22 0629 06/21/22 0017 06/22/22 0024 06/23/22 0051  WBC 4.3 4.8 4.7 5.7 6.0  HGB 8.2* 8.1* 7.5* 8.2* 8.1*  HCT 25.7* 25.7* 23.2* 25.4* 26.0*  PLT 203 223 210 235 283  MCV 88.9 88.3 87.9 86.7 87.5  MCH 28.4 27.8 28.4 28.0 27.3  MCHC 31.9 31.5 32.3 32.3 31.2  RDW 15.1 15.1 14.9 15.0 15.3  LYMPHSABS 0.7 1.1 1.5 1.9 1.8  MONOABS 0.4 0.6 0.5 0.6 0.6  EOSABS 0.1 0.1 0.1 0.1 0.2  BASOSABS 0.0 0.0 0.0 0.0 0.0    Recent Labs  Lab 06/19/22 0626 06/19/22 1756 06/20/22 0629 06/21/22 0017 06/22/22 0024 06/23/22 0051  NA 138  --  135 135 134* 135  K 3.5  --  3.7 3.4* 3.8 3.8  CL 99  --  96* 95* 95* 96*  CO2 25  --  25 27 27 30   GLUCOSE 83  --  101* 89 97 107*  BUN 31*  --  51* 35* 51* 35*  CREATININE 3.62*  --  4.80* 3.41* 4.75* 3.28*  CALCIUM 8.9  --  9.1 9.0 9.3 9.0  AST 52*  --  52* 44* 37 46*  ALT 35  --  33 29 27 25   ALKPHOS 63  --  65 62 71 75  BILITOT 0.6  --  0.4 0.4 0.3 0.1*  ALBUMIN 1.9*  --  1.8* 1.7* 1.7* 1.7*  MG 1.7 1.6* 1.8 1.6* 1.9 1.7     ------------------------------------------------------------------------------------------------------------------ No results for input(s): "CHOL", "HDL", "LDLCALC", "TRIG", "CHOLHDL", "LDLDIRECT" in the last 72 hours.  No results found for: "HGBA1C" ------------------------------------------------------------------------------------------------------------------ No results for input(s): "TSH", "T4TOTAL", "T3FREE", "THYROIDAB" in the last 72 hours.  Invalid input(s): "FREET3"  Cardiac Enzymes No results for input(s): "CKMB", "TROPONINI", "MYOGLOBIN" in the last 168 hours.  Invalid input(s): "CK" ------------------------------------------------------------------------------------------------------------------    Component Value Date/Time   BNP >4,500.0 (H) 04/04/2022 0513    CBG: Recent Labs  Lab 06/22/22 1515 06/22/22 2023 06/22/22 2354 06/23/22 0417 06/23/22 0735  GLUCAP 111* 104* 104* 104* 119*    Recent Results (from the past 240 hour(s))  Culture, blood (Routine X 2) w Reflex to ID Panel     Status: None   Collection Time: 06/14/22 10:26 AM   Specimen: BLOOD  Result Value Ref Range Status   Specimen Description   Final    BLOOD BLOOD RIGHT ARM AEROBIC BOTTLE ONLY ANAEROBIC BOTTLE ONLY   Special Requests   Final    BOTTLES DRAWN AEROBIC AND ANAEROBIC Blood Culture adequate volume   Culture   Final    NO GROWTH 5 DAYS Performed at [facility], [place] [address], [place], [place] [address]    Report Status 06/19/2022 FINAL  Final  Culture, blood (Routine X 2) w Reflex to ID Panel     Status: None   Collection Time: 06/14/22 10:26 AM   Specimen: BLOOD RIGHT ARM  Result Value Ref Range Status   Specimen Description   Final    BLOOD RIGHT ARM AEROBIC BOTTLE ONLY ANAEROBIC BOTTLE ONLY   Special Requests   Final    BOTTLES DRAWN AEROBIC AND ANAEROBIC Blood Culture adequate volume   Culture   Final    NO GROWTH 5 DAYS Performed at [facility],  [place] [address], [place], [place] [address]    Report Status 06/19/2022 FINAL  Final     Radiology Studies: DG CHEST PORT  1 VIEW  Result Date: 06/23/2022 CLINICAL DATA:  Shortness of breath EXAM: PORTABLE CHEST 1 VIEW COMPARISON:  Radiograph 06/22/2022 FINDINGS: Right neck catheter tip overlies the mid SVC. Unchanged cardiomediastinal silhouette. Feeding tube passes below the diaphragm, tip excluded by collimation. Mild perihilar predominant interstitial opacities, unchanged from prior. No new airspace disease. No pleural effusion or pneumothorax. Bones are unchanged. IMPRESSION: Mild perihilar predominant interstitial opacities, unchanged from prior, could be pulmonary edema or atypical infection. No new airspace disease. Electronically Signed   By: Maurine Simmering M.D.   On: 06/23/2022 08:36   DG CHEST PORT 1 VIEW  Result Date: 06/22/2022 CLINICAL DATA:  Shortness of breath. EXAM: PORTABLE CHEST 1 VIEW COMPARISON:  Chest radiograph dated 06/21/2022. FINDINGS: With sided Port-A-Cath with tip over central SVC. Feeding tube extends below the diaphragm with tip beyond the inferior margin of the image. Mild diffuse interstitial prominence and perihilar streaky densities may represent edema. Developing infiltrate is not excluded clinical correlation recommended. No consolidative changes with there is no pleural effusion pneumothorax. Stable cardiac silhouette. No acute osseous pathology. IMPRESSION: Mild diffuse interstitial prominence and perihilar streaky densities may represent edema or developing infiltrate. Electronically Signed   By: Anner Crete M.D.   On: 06/22/2022 19:31     Marzetta Board, MD, PhD [facility]  Between 7 am - 7 pm I am available, please contact me via Amion (for emergencies) or Securechat (non urgent messages)  Between 7 pm - 7 am I am not available, please contact night coverage MD/APP via Amion

## 2022-06-23 NOTE — Plan of Care (Addendum)
Pt's tube feeds changed to Jevity. Pt intermittently tachypneic. PRN miralax given.  Problem: Education: Goal: Knowledge of General Education information will improve Description: Including pain rating scale, medication(s)/side effects and non-pharmacologic comfort measures Outcome: Progressing   Problem: Clinical Measurements: Goal: Ability to maintain clinical measurements within normal limits will improve 06/23/2022 2022 by Donzetta Sprung, RN Outcome: Progressing 06/23/2022 2021 by Donzetta Sprung, RN Outcome: Progressing Goal: Will remain free from infection 06/23/2022 2022 by Donzetta Sprung, RN Outcome: Progressing 06/23/2022 2021 by Donzetta Sprung, RN Outcome: Progressing Goal: Diagnostic test results will improve 06/23/2022 2022 by Donzetta Sprung, RN Outcome: Progressing 06/23/2022 2021 by Donzetta Sprung, RN Outcome: Progressing Goal: Respiratory complications will improve 06/23/2022 2022 by Donzetta Sprung, RN Outcome: Progressing 06/23/2022 2021 by Donzetta Sprung, RN Outcome: Progressing Goal: Cardiovascular complication will be avoided 06/23/2022 2022 by Donzetta Sprung, RN Outcome: Progressing 06/23/2022 2021 by Donzetta Sprung, RN Outcome: Progressing   Problem: Activity: Goal: Risk for activity intolerance will decrease 06/23/2022 2022 by Donzetta Sprung, RN Outcome: Progressing 06/23/2022 2021 by Donzetta Sprung, RN Outcome: Progressing   Problem: Nutrition: Goal: Adequate nutrition will be maintained 06/23/2022 2022 by Donzetta Sprung, RN Outcome: Progressing 06/23/2022 2021 by Donzetta Sprung, RN Outcome: Progressing   Problem: Coping: Goal: Level of anxiety will decrease 06/23/2022 2022 by Donzetta Sprung, RN Outcome: Progressing 06/23/2022 2021 by Donzetta Sprung, RN Outcome: Progressing   Problem: Elimination: Goal: Will not experience complications related to urinary retention 06/23/2022 2022 by Donzetta Sprung, RN Outcome:  Progressing 06/23/2022 2021 by Donzetta Sprung, RN Outcome: Progressing   Problem: Pain Managment: Goal: General experience of comfort will improve 06/23/2022 2022 by Donzetta Sprung, RN Outcome: Progressing 06/23/2022 2021 by Donzetta Sprung, RN Outcome: Progressing   Problem: Safety: Goal: Ability to remain free from injury will improve 06/23/2022 2022 by Donzetta Sprung, RN Outcome: Progressing 06/23/2022 2021 by Donzetta Sprung, RN Outcome: Progressing   Problem: Skin Integrity: Goal: Risk for impaired skin integrity will decrease 06/23/2022 2022 by Donzetta Sprung, RN Outcome: Progressing 06/23/2022 2021 by Donzetta Sprung, RN Outcome: Progressing   Problem: Health Behavior/Discharge Planning: Goal: Ability to manage health-related needs will improve 06/23/2022 2022 by Donzetta Sprung, RN Outcome: Not Progressing 06/23/2022 2021 by Donzetta Sprung, RN Outcome: Progressing   Problem: Elimination: Goal: Will not experience complications related to bowel motility 06/23/2022 2022 by Donzetta Sprung, RN Outcome: Not Progressing 06/23/2022 2021 by Donzetta Sprung, RN Outcome: Progressing

## 2022-06-23 NOTE — Progress Notes (Signed)
Nutrition Follow-up  DOCUMENTATION CODES:   Underweight, Severe malnutrition in context of chronic illness  INTERVENTION:  Change tube feeding regimen via Cortrak to Jevity 1.5 at 4m/hr (12074mper day)  60 ml ProSource TF20 once daily, each supplement provides 80 kcals and 20 grams protein.   New tube feeding regimen provides 1880 kcal, 97g protein, and 91261mree water  Recommend scheduled bowel regimen  NUTRITION DIAGNOSIS:   Severe Malnutrition related to chronic illness (ESRD on HD) as evidenced by severe muscle depletion, severe fat depletion.  Ongoing- addressing via tube feeding  GOAL:   Patient will meet greater than or equal to 90% of their needs  Goal met via TF  MONITOR:   Diet advancement, Weight trends, Supplement acceptance, Labs  REASON FOR ASSESSMENT:   Consult Assessment of nutrition requirement/status  ASSESSMENT:   Pt with hx of ESRD on HD, hx CVA, HTN, and hx of EtOH and tobacco abuse presented to ED from SNF with SOB, hyperkalemia, and in HTN crisis.  11/17 Cortrak placed 11/20 diet downgraded to NPO by ST d/t oral holding and pocketing  PMT spoke with family yesterday. Pt is now DNR, plans for PEG placement. Per MD note today, placement will be next week d/t long holiday.    Pt sleeping at time of visit. He awoke to his name and provided "yes/no" responses to questions. Denies stomach pain, nausea, vomiting. Has been tolerating TF at goal rate.   Last documented BM 11/18. RN provided PRN colace and miralax yesterday. He would likely benefit from a scheduled regimen.   HD yesterday- net UF 800m20mext HD 11/24  Post HD weight 46.5 kg  Medications: pepcid, medihoney, rena-vit  Labs: BUN 35, Cr 3.28, phos 2.4, AST 46, GFR 21, CBG's 104-11 x24 hours  Diet Order:   Diet Order             Diet NPO time specified Except for: Ice Chips  Diet effective now                   EDUCATION NEEDS:   Not appropriate for education at  this time  Skin:  Skin Assessment: Skin Integrity Issues: Skin Integrity Issues:: DTI, Stage II DTI: coccyx, L foot, L ankle Stage II: L heel  Last BM:  06/13/22  Height:   Ht Readings from Last 1 Encounters:  06/09/22 _0  (1.727 m)    Weight:   Wt Readings from Last 1 Encounters:  06/23/22 45.5 kg    Ideal Body Weight:  70 kg  BMI:  Body mass index is 15.25 kg/m.  Estimated Nutritional Needs:   Kcal:  1700-1900 kcal/d  Protein:  80-100 g/d  Fluid:  1L+UOP  AlliClayborne DanaN, LDN Clinical Nutrition

## 2022-06-23 NOTE — TOC Progression Note (Signed)
Transition of Care [facility]) - Progression Note    Patient Details  Name: Cody Macdonald MRN: [id number] Date of Birth: 1960/10/29  Transition of Care [facility]) CM/SW [place], [place] Phone Number: 06/23/2022, 3:12 PM  Clinical Narrative:     TOC continuing to follow to assist with disposition back to SNF. Currently with Coretrak and may get PEG tube.        Expected Discharge Plan and Services                                                 Social Determinants of Health (SDOH) Interventions    Readmission Risk Interventions     No data to display

## 2022-06-23 NOTE — Progress Notes (Signed)
SLP Cancellation Note  Patient Details Name: Cody Macdonald MRN: [id number] DOB: Jan 09, 1961   Cancelled treatment:       Reason Eval/Treat Not Completed: Other (comment). Discussed patient status with RN via phone. Per RN, patient with plans for probably PEG today, remained NPO. SLP will f/u when patient able to resume po trials.   Gabriel Rainwater MA, CCC-SLP    Cody Macdonald 06/23/2022, 10:40 AM

## 2022-06-23 NOTE — Progress Notes (Signed)
[facility] Progress Note   Subjective:    HD yesterday 0.8L Palliative notes reviewed, agrees to PEG Seen in room, in bed, denies issues or complaints  Objective Vitals:   06/22/22 2356 06/23/22 0500 06/23/22 0541 06/23/22 0737  BP: 101/61  102/64 120/64  Pulse: 96   96  Resp: (!) 22   20  Temp: 98.8 F (37.1 C)   98.8 F (37.1 C)  TempSrc:    Axillary  SpO2:    98%  Weight:  45.5 kg    Height:       Physical Exam General:chronically ill appearing, cachetic, frail male in NAD Heart:RRR Lungs:CTAB anteriorly  Abdomen:soft, NTND Extremities:no LE edema Dialysis Access: TDC, LU AVG +b/t   Filed Weights   06/21/22 0023 06/22/22 1423 06/23/22 0500  Weight: 45.6 kg 46.5 kg 45.5 kg    Intake/Output Summary (Last 24 hours) at 06/23/2022 0934 Last data filed at 06/23/2022 0600 Gross per 24 hour  Intake 1316.66 ml  Output 860 ml  Net 456.66 ml     Additional Objective Labs: Basic Metabolic Panel: Recent Labs  Lab 06/21/22 0017 06/22/22 0024 06/23/22 0051  NA 135 134* 135  K 3.4* 3.8 3.8  CL 95* 95* 96*  CO2 27 27 30   GLUCOSE 89 97 107*  BUN 35* 51* 35*  CREATININE 3.41* 4.75* 3.28*  CALCIUM 9.0 9.3 9.0  PHOS 2.2* 3.5 2.4*    Liver Function Tests: Recent Labs  Lab 06/21/22 0017 06/22/22 0024 06/23/22 0051  AST 44* 37 46*  ALT 29 27 25   ALKPHOS 62 71 75  BILITOT 0.4 0.3 0.1*  PROT 6.1* 5.9* 6.2*  ALBUMIN 1.7* 1.7* 1.7*    CBC: Recent Labs  Lab 06/19/22 0626 06/20/22 0629 06/21/22 0017 06/22/22 0024 06/23/22 0051  WBC 4.3 4.8 4.7 5.7 6.0  NEUTROABS 3.1 3.0 2.6 3.1 3.3  HGB 8.2* 8.1* 7.5* 8.2* 8.1*  HCT 25.7* 25.7* 23.2* 25.4* 26.0*  MCV 88.9 88.3 87.9 86.7 87.5  PLT 203 223 210 235 283    CBG: Recent Labs  Lab 06/22/22 1515 06/22/22 2023 06/22/22 2354 06/23/22 0417 06/23/22 0735  GLUCAP 111* 104* 104* 104* 119*     Studies/Results: DG CHEST PORT 1 VIEW  Result Date: 06/23/2022 CLINICAL DATA:  Shortness  of breath EXAM: PORTABLE CHEST 1 VIEW COMPARISON:  Radiograph 06/22/2022 FINDINGS: Right neck catheter tip overlies the mid SVC. Unchanged cardiomediastinal silhouette. Feeding tube passes below the diaphragm, tip excluded by collimation. Mild perihilar predominant interstitial opacities, unchanged from prior. No new airspace disease. No pleural effusion or pneumothorax. Bones are unchanged. IMPRESSION: Mild perihilar predominant interstitial opacities, unchanged from prior, could be pulmonary edema or atypical infection. No new airspace disease. Electronically Signed   By: Maurine Simmering M.D.   On: 06/23/2022 08:36   DG CHEST PORT 1 VIEW  Result Date: 06/22/2022 CLINICAL DATA:  Shortness of breath. EXAM: PORTABLE CHEST 1 VIEW COMPARISON:  Chest radiograph dated 06/21/2022. FINDINGS: With sided Port-A-Cath with tip over central SVC. Feeding tube extends below the diaphragm with tip beyond the inferior margin of the image. Mild diffuse interstitial prominence and perihilar streaky densities may represent edema. Developing infiltrate is not excluded clinical correlation recommended. No consolidative changes with there is no pleural effusion pneumothorax. Stable cardiac silhouette. No acute osseous pathology. IMPRESSION: Mild diffuse interstitial prominence and perihilar streaky densities may represent edema or developing infiltrate. Electronically Signed   By: Anner Crete M.D.   On: 06/22/2022 19:31  Medications:  feeding supplement (NEPRO CARB STEADY) 40 mL/hr at 06/22/22 0000    aspirin  81 mg Per Tube Daily   Chlorhexidine Gluconate Cloth  6 each Topical Q0600   clopidogrel  75 mg Per Tube Daily   [START ON 06/24/2022] darbepoetin (ARANESP) injection - DIALYSIS  100 mcg Subcutaneous Q Thu-1800   diclofenac Sodium  4 g Topical Once   famotidine  20 mg Per Tube Daily   feeding supplement (PROSource TF20)  60 mL Per Tube Daily   heparin  5,000 Units Subcutaneous Q8H   hydrALAZINE  50 mg Per Tube  Q8H   labetalol  300 mg Per Tube BID   leptospermum manuka honey  1 Application Topical Daily   multivitamin  1 tablet Per Tube QHS   mouth rinse  15 mL Mouth Rinse 4 times per day    Dialysis Orders: MWF at [facility] [address]  3.5h  48kg  2.2/5 bath  300/600  Hep none R AVG/TDC (AVG ok to use reportedly)    CXR 11/10- bilat splotchy perihilar disease, edema vs infxn, improved compared to 11/8 film   Assessment/ Plan: 1.  Acute hypoxic respiratory failure: due to combination of pulmonary edema/volume overload with bronchospasm along with influenza +/- and probable HCAP.  Serial CXR's showed resolution of infiltrates. Resolved and stable on RA.  2. Altered mental status: per staff at [facility] is not unusual for him not to respond verbally at the OP unit.  3.  ESRD: on HD MWF.  HD Sun/Tues/Fri this week per Thanksgiving Holiday schedule.  Next HD 11/24 4. HD access: had TDC troubles initially but working now. We were told that the R AVG was ready to be used as well but we haven't been using here.  5.  HTN/ vol: Blood pressure good. Well under dry wt. BP's not tolerating further UF. Minimal UF with HD.  6.  Secondary hyperparathyroidism: HOld sevelamer as P < 3.0 7.  Anemia of chronic disease: Hemoglobin 8.1.  aranesp 175mcg weekly.  No iron for now 8.  GOC - pt very frail, not interacting very much verbally. Appreciate palliative assistance. Poor prognosis.   Rexene Agent, MD  [facility] 06/23/2022,9:34 AM  LOS: 15 days

## 2022-06-24 ENCOUNTER — Other Ambulatory Visit: Payer: Self-pay

## 2022-06-24 ENCOUNTER — Encounter (HOSPITAL_COMMUNITY): Payer: Self-pay

## 2022-06-24 DIAGNOSIS — J189 Pneumonia, unspecified organism: Secondary | ICD-10-CM | POA: Diagnosis not present

## 2022-06-24 LAB — CBC
HCT: 23.2 % — ABNORMAL LOW (ref 39.0–52.0)
Hemoglobin: 7.4 g/dL — ABNORMAL LOW (ref 13.0–17.0)
MCH: 27.6 pg (ref 26.0–34.0)
MCHC: 31.9 g/dL (ref 30.0–36.0)
MCV: 86.6 fL (ref 80.0–100.0)
Platelets: 284 10*3/uL (ref 150–400)
RBC: 2.68 MIL/uL — ABNORMAL LOW (ref 4.22–5.81)
RDW: 15.3 % (ref 11.5–15.5)
WBC: 6.9 10*3/uL (ref 4.0–10.5)
nRBC: 0 % (ref 0.0–0.2)

## 2022-06-24 LAB — COMPREHENSIVE METABOLIC PANEL
ALT: 24 U/L (ref 0–44)
AST: 39 U/L (ref 15–41)
Albumin: 1.7 g/dL — ABNORMAL LOW (ref 3.5–5.0)
Alkaline Phosphatase: 67 U/L (ref 38–126)
Anion gap: 13 (ref 5–15)
BUN: 59 mg/dL — ABNORMAL HIGH (ref 6–20)
CO2: 28 mmol/L (ref 22–32)
Calcium: 9.1 mg/dL (ref 8.9–10.3)
Chloride: 92 mmol/L — ABNORMAL LOW (ref 98–111)
Creatinine, Ser: 4.68 mg/dL — ABNORMAL HIGH (ref 0.61–1.24)
GFR, Estimated: 14 mL/min — ABNORMAL LOW (ref >60)
Glucose, Bld: 129 mg/dL — ABNORMAL HIGH (ref 70–99)
Potassium: 4.3 mmol/L (ref 3.5–5.1)
Sodium: 133 mmol/L — ABNORMAL LOW (ref 135–145)
Total Bilirubin: 0.4 mg/dL (ref 0.3–1.2)
Total Protein: 6 g/dL — ABNORMAL LOW (ref 6.5–8.1)

## 2022-06-24 LAB — GLUCOSE, CAPILLARY
Glucose-Capillary: 106 mg/dL — ABNORMAL HIGH (ref 70–99)
Glucose-Capillary: 118 mg/dL — ABNORMAL HIGH (ref 70–99)
Glucose-Capillary: 121 mg/dL — ABNORMAL HIGH (ref 70–99)
Glucose-Capillary: 126 mg/dL — ABNORMAL HIGH (ref 70–99)
Glucose-Capillary: 127 mg/dL — ABNORMAL HIGH (ref 70–99)
Glucose-Capillary: 129 mg/dL — ABNORMAL HIGH (ref 70–99)

## 2022-06-24 NOTE — Progress Notes (Signed)
PROGRESS NOTE  BAUER AUSBORN VFI:[id number] DOB: 1960/09/06 DOA: 06/08/2022 PCP: Pcp, No   LOS: 16 days   Brief Narrative / Interim history: 61 year old male with CVA, HTN, ESRD on HD MWF, sent from his SNF due to shortness of breath and hypertensive urgency.  He was hypoxic requiring up to 10 L nasal cannula and had pulmonary edema on the chest x-ray.  He was initially in the ICU, underwent emergent dialysis and required Cleviprex infusion for hypertensive urgency.  With improvement he was transferred to the hospitalist service on 11/10.  Hospital course complicated by poor mentation, persistent encephalopathy and failed a swallow eval.  Palliative care was consulted and multiple GOC were held, he is now DNR and family wishes for PEG tube which replaced next week.  Subjective / 24h Interval events: Alert, can answer yes/no questions but does not engage much in conversation.  Assesement and Plan: Principal Problem:   HCAP (healthcare-associated pneumonia) Active Problems:   ESRD (end stage renal disease) ([place])   Hypertensive urgency   Influenza A   Acute respiratory failure with hypoxia (HCC)   Protein-calorie malnutrition, severe  Principal problem Acute hypoxic respiratory failure due to multifocal pneumonia due to influenza A as well as superimposed acute pulmonary edema in the setting of fluid overload in a dialysis patient-respiratory status overall improving, currently he is on room air.  He completed a course of Tamiflu as well as empiric antibiotics.  He is afebrile now, WBC normal, monitor off antibiotics, he completed a 10-day course on 11/17.  Remains afebrile  Active problems  Hypertensive Urgency -patient required to be in ICU on a Cleviprex drip, currently on labetalol, hydralazine with improvement in his blood pressure.  Blood pressure stable this morning  ESRD on HD-management as per nephrology.  Hypophosphatemia, hypokalemia and hypomagnesemia -Likely to be corrected  in dialysis and this is now improved.  Continue to monitor and trend   Poor p.o. intake -Has been refusing to eat and not really eating very much.  PEG tube next week due to long holiday   H/o CVA -On Aspirin and Clopidogrel , on dysphagia 1 diet but not eating very well   Anemia of Chronic Kidney Disease -Stable   Hypoalbuminemia -noted   Acute Metabolic Encephalopathy-Probably sec to influenza A infection, overall proving but still does not engage much on my evaluation   Abnormal LFTs -minimal, improving   Underweight/ Severe Malnutrition in the Context of Chronic Illness/cachexia -on tube feeds  Pressure Ulcers, not present on admission -WOC RN consulted. Continue silicone foam to the left ear as well as leave the left medial foot wounds open to the air as well as debridement agent and antimicrobial to sacrum daily and they are recommending the patient can be turned side to side and they are not can add an air mattress.  The wound care nurse also recommend Prevalon boots in place for offloading heels but recommended the staff needing to be cautious when applying straps and a foot placement when the areas have appeared.  Scheduled Meds:  aspirin  81 mg Per Tube Daily   Chlorhexidine Gluconate Cloth  6 each Topical Q0600   clopidogrel  75 mg Per Tube Daily   darbepoetin (ARANESP) injection - DIALYSIS  100 mcg Subcutaneous Q Thu-1800   diclofenac Sodium  4 g Topical Once   famotidine  20 mg Per Tube Daily   feeding supplement (PROSource TF20)  60 mL Per Tube Daily   heparin  5,000 Units Subcutaneous Q8H  hydrALAZINE  50 mg Per Tube Q8H   labetalol  300 mg Per Tube BID   leptospermum manuka honey  1 Application Topical Daily   multivitamin  1 tablet Per Tube QHS   mouth rinse  15 mL Mouth Rinse 4 times per day   Continuous Infusions:  feeding supplement (JEVITY 1.5 CAL/FIBER) 1,000 mL (06/23/22 1634)   PRN Meds:.acetaminophen, acetaminophen, albuterol, docusate sodium,  guaiFENesin, hydrALAZINE, hydrOXYzine, labetalol, mouth rinse, polyethylene glycol  Current Outpatient Medications  Medication Instructions   acetaminophen (TYLENOL) 650 mg, Oral, Every 6 hours PRN   albuterol (PROVENTIL) 2.5 mg, Nebulization, Every 6 hours PRN   amLODipine (NORVASC) 10 mg, Oral, Daily   aspirin EC 81 mg, Oral, Daily, Swallow whole.   atorvastatin (LIPITOR) 80 mg, Oral, Daily at bedtime   clopidogrel (PLAVIX) 75 mg, Oral, Daily   ergocalciferol (VITAMIN D2) 50,000 Units, Every Tue   feeding supplement (ENSURE ENLIVE / ENSURE PLUS) LIQD 237 mLs, Oral, 2 times daily between meals   hydrALAZINE (APRESOLINE) 50 mg, Oral, Every 8 hours   labetalol (NORMODYNE) 300 mg, Oral, 2 times daily   melatonin 3 mg, Oral, Daily at bedtime   MULTIPLE VITAMIN PO 1 tablet, Oral, Daily at bedtime   multivitamin (RENA-VIT) TABS tablet 1 tablet, Oral, Daily at bedtime   Nutritional Supplements (NEPRO PO) Oral, 2 times daily, AHR Nepro   pantoprazole (PROTONIX) 40 mg, Oral, Daily   sevelamer carbonate (RENVELA) 800 mg, Oral, 3 times daily with meals   UNABLE TO FIND 30 mLs, Oral, 2 times daily, Liquid protein     Diet Orders (From admission, onward)     Start     Ordered   06/21/22 0919  Diet NPO time specified Except for: Ice Chips  Diet effective now       Comments: After oral care if pt is alert  Question:  Except for  Answer:  Ice Chips   06/21/22 0918            DVT prophylaxis: heparin injection 5,000 Units Start: 06/08/22 2200 SCDs Start: 06/08/22 1543   Lab Results  Component Value Date   PLT 284 06/24/2022      Code Status: DNR  Family Communication: no family at bedside   Status is: Inpatient  Remains inpatient appropriate because: severity of illness  Level of care: Progressive  Consultants:  Palliative PCCM  Objective: Vitals:   06/23/22 2359 06/24/22 0403 06/24/22 0445 06/24/22 0804  BP: 101/63 110/60  119/63  Pulse: 89 91  90  Resp: (!) 32 (!)  23  19  Temp: 98.7 F (37.1 C) 98 F (36.7 C)  98 F (36.7 C)  TempSrc: Oral Oral  Oral  SpO2: 99% 98%  98%  Weight: 48 kg  48 kg   Height:        Intake/Output Summary (Last 24 hours) at 06/24/2022 1004 Last data filed at 06/24/2022 0000 Gross per 24 hour  Intake 542.67 ml  Output 0 ml  Net 542.67 ml    Wt Readings from Last 3 Encounters:  06/24/22 48 kg  04/12/22 48.2 kg  01/01/22 52.3 kg    Examination:  Constitutional: NAD Eyes: lids and conjunctivae normal, no scleral icterus ENMT: mmm Neck: normal, supple Respiratory: clear to auscultation bilaterally, no wheezing, no crackles. Normal respiratory effort.  Cardiovascular: Regular rate and rhythm, no murmurs / rubs / gallops. No LE edema. Abdomen: soft, no distention, no tenderness. Bowel sounds positive.  Skin: no rashes Neurologic: no focal  deficits, equal strength  Data Reviewed: I have independently reviewed following labs and imaging studies   CBC Recent Labs  Lab 06/19/22 0626 06/20/22 0629 06/21/22 0017 06/22/22 0024 06/23/22 0051 06/24/22 0026  WBC 4.3 4.8 4.7 5.7 6.0 6.9  HGB 8.2* 8.1* 7.5* 8.2* 8.1* 7.4*  HCT 25.7* 25.7* 23.2* 25.4* 26.0* 23.2*  PLT 203 223 210 235 283 284  MCV 88.9 88.3 87.9 86.7 87.5 86.6  MCH 28.4 27.8 28.4 28.0 27.3 27.6  MCHC 31.9 31.5 32.3 32.3 31.2 31.9  RDW 15.1 15.1 14.9 15.0 15.3 15.3  LYMPHSABS 0.7 1.1 1.5 1.9 1.8  --   MONOABS 0.4 0.6 0.5 0.6 0.6  --   EOSABS 0.1 0.1 0.1 0.1 0.2  --   BASOSABS 0.0 0.0 0.0 0.0 0.0  --      Recent Labs  Lab 06/19/22 1756 06/20/22 0629 06/21/22 0017 06/22/22 0024 06/23/22 0051 06/24/22 0026  NA  --  135 135 134* 135 133*  K  --  3.7 3.4* 3.8 3.8 4.3  CL  --  96* 95* 95* 96* 92*  CO2  --  25 27 27 30 28   GLUCOSE  --  101* 89 97 107* 129*  BUN  --  51* 35* 51* 35* 59*  CREATININE  --  4.80* 3.41* 4.75* 3.28* 4.68*  CALCIUM  --  9.1 9.0 9.3 9.0 9.1  AST  --  52* 44* 37 46* 39  ALT  --  33 29 27 25 24   ALKPHOS  --  65  62 71 75 67  BILITOT  --  0.4 0.4 0.3 0.1* 0.4  ALBUMIN  --  1.8* 1.7* 1.7* 1.7* 1.7*  MG 1.6* 1.8 1.6* 1.9 1.7  --      ------------------------------------------------------------------------------------------------------------------ No results for input(s): "CHOL", "HDL", "LDLCALC", "TRIG", "CHOLHDL", "LDLDIRECT" in the last 72 hours.  No results found for: "HGBA1C" ------------------------------------------------------------------------------------------------------------------ No results for input(s): "TSH", "T4TOTAL", "T3FREE", "THYROIDAB" in the last 72 hours.  Invalid input(s): "FREET3"  Cardiac Enzymes No results for input(s): "CKMB", "TROPONINI", "MYOGLOBIN" in the last 168 hours.  Invalid input(s): "CK" ------------------------------------------------------------------------------------------------------------------    Component Value Date/Time   BNP >4,500.0 (H) 04/04/2022 0513    CBG: Recent Labs  Lab 06/23/22 1537 06/23/22 2006 06/24/22 0003 06/24/22 0400 06/24/22 0825  GLUCAP 116* 111* 121* 126* 129*     Recent Results (from the past 240 hour(s))  Culture, blood (Routine X 2) w Reflex to ID Panel     Status: None   Collection Time: 06/14/22 10:26 AM   Specimen: BLOOD  Result Value Ref Range Status   Specimen Description   Final    BLOOD BLOOD RIGHT ARM AEROBIC BOTTLE ONLY ANAEROBIC BOTTLE ONLY   Special Requests   Final    BOTTLES DRAWN AEROBIC AND ANAEROBIC Blood Culture adequate volume   Culture   Final    NO GROWTH 5 DAYS Performed at [facility], [place] [address], [place], [place] [address]    Report Status 06/19/2022 FINAL  Final  Culture, blood (Routine X 2) w Reflex to ID Panel     Status: None   Collection Time: 06/14/22 10:26 AM   Specimen: BLOOD RIGHT ARM  Result Value Ref Range Status   Specimen Description   Final    BLOOD RIGHT ARM AEROBIC BOTTLE ONLY ANAEROBIC BOTTLE ONLY   Special Requests   Final    BOTTLES DRAWN AEROBIC  AND ANAEROBIC Blood Culture adequate volume   Culture  Final    NO GROWTH 5 DAYS Performed at [facility], [place] [address] [place], [place] [address]    Report Status 06/19/2022 FINAL  Final     Radiology Studies: No results found.   Marzetta Board, MD, PhD [facility]  Between 7 am - 7 pm I am available, please contact me via Amion (for emergencies) or Securechat (non urgent messages)  Between 7 pm - 7 am I am not available, please contact night coverage MD/APP via Amion

## 2022-06-24 NOTE — Progress Notes (Signed)
Speech Language Pathology Treatment: Dysphagia  Patient Details Name: Cody Macdonald MRN: [id number] DOB: [birth year] Today's Date: 06/24/2022 Time: 4196-2229 SLP Time Calculation (min) (ACUTE ONLY): 17 min  Assessment / Plan / Recommendation Clinical Impression  Cody Macdonald was alert and answering questions with more spontaneity today.  He was seated upright in bed, just having been bathed by nursing.  He accepted ice chips with good oral attention and vigorous chewing. He was able to sip from a straw and drank 4 oz of water with no s/s of aspiration. He did occasionally need cues to attend to the straw and drink.  Recommend resuming liquids at this time (there was no oral holding today- this had been a greater problem with solids per chart review.)  PO intake has been poor and family has decided to pursue PEG. However, he may be able to eat/drink for pleasure if mentation allows. GIven performance today, he is certainly able to drink liquids. Please hold if not alert. SLP will follow.    HPI HPI: Pt is a 61 y.o. male who [facility] to [facility] ED 11/7 with dyspnea, hypertensive urgency, hyperkalemia and was transferred to [facility] for dialysis. CXR 11/8: Progressed bilateral multilobar airspace opacity now with early  consolidation suspected. Differential considerations include  asymmetric pulmonary edema, bilateral pneumonia, developing ARDS. Pt found to have influenza A. MBS 11/9 rec regular /thin liquids.  Cortrak placed 11/17 due to worsening mentation impacting po. intake. PMH: ESRD on HD MWF, CKD, CVA, HTN, Etoh abuse tobacco abuse, anemia.      SLP Plan  Continue with current plan of care      Recommendations for follow up therapy are one component of a multi-disciplinary discharge planning process, led by the attending physician.  Recommendations may be updated based on patient status, additional functional criteria and insurance authorization.    Recommendations  Diet recommendations: Thin  liquid Liquids provided via: Straw Medication Administration: Via alternative means Supervision: Trained caregiver to feed patient Compensations: Small sips/bites Postural Changes and/or Swallow Maneuvers: Seated upright 90 degrees                Oral Care Recommendations: Oral care BID Follow Up Recommendations: Skilled nursing-short term rehab (<3 hours/day) Assistance recommended at discharge: Frequent or constant Supervision/Assistance SLP Visit Diagnosis: Dysphagia, unspecified (R13.10) Plan: Continue with current plan of care         [place]. Tivis Ringer, MA CCC/SLP Clinical Specialist - Acute Care SLP Acute Rehabilitation Services Office number [phone]   Cody Macdonald  06/24/2022, 10:49 AM

## 2022-06-24 NOTE — Progress Notes (Signed)
[facility] Progress Note   Subjective:    No interval events Denies problems  Objective Vitals:   06/23/22 2359 06/24/22 0403 06/24/22 0445 06/24/22 0804  BP: 101/63 110/60  119/63  Pulse: 89 91  90  Resp: (!) 32 (!) 23  19  Temp: 98.7 F (37.1 C) 98 F (36.7 C)  98 F (36.7 C)  TempSrc: Oral Oral  Oral  SpO2: 99% 98%  98%  Weight: 48 kg  48 kg   Height:       Physical Exam General:chronically ill appearing, cachetic, frail male in NAD Heart:RRR Lungs:CTAB anteriorly  Abdomen:soft, NTND Extremities:no LE edema Dialysis Access: TDC, LU AVG +b/t   Filed Weights   06/23/22 0500 06/23/22 2359 06/24/22 0445  Weight: 45.5 kg 48 kg 48 kg    Intake/Output Summary (Last 24 hours) at 06/24/2022 1016 Last data filed at 06/24/2022 0000 Gross per 24 hour  Intake 252.67 ml  Output 0 ml  Net 252.67 ml     Additional Objective Labs: Basic Metabolic Panel: Recent Labs  Lab 06/21/22 0017 06/22/22 0024 06/23/22 0051 06/24/22 0026  NA 135 134* 135 133*  K 3.4* 3.8 3.8 4.3  CL 95* 95* 96* 92*  CO2 27 27 30 28   GLUCOSE 89 97 107* 129*  BUN 35* 51* 35* 59*  CREATININE 3.41* 4.75* 3.28* 4.68*  CALCIUM 9.0 9.3 9.0 9.1  PHOS 2.2* 3.5 2.4*  --     Liver Function Tests: Recent Labs  Lab 06/22/22 0024 06/23/22 0051 06/24/22 0026  AST 37 46* 39  ALT 27 25 24   ALKPHOS 71 75 67  BILITOT 0.3 0.1* 0.4  PROT 5.9* 6.2* 6.0*  ALBUMIN 1.7* 1.7* 1.7*    CBC: Recent Labs  Lab 06/20/22 0629 06/21/22 0017 06/22/22 0024 06/23/22 0051 06/24/22 0026  WBC 4.8 4.7 5.7 6.0 6.9  NEUTROABS 3.0 2.6 3.1 3.3  --   HGB 8.1* 7.5* 8.2* 8.1* 7.4*  HCT 25.7* 23.2* 25.4* 26.0* 23.2*  MCV 88.3 87.9 86.7 87.5 86.6  PLT 223 210 235 283 284    CBG: Recent Labs  Lab 06/23/22 1537 06/23/22 2006 06/24/22 0003 06/24/22 0400 06/24/22 0825  GLUCAP 116* 111* 121* 126* 129*     Studies/Results: DG CHEST PORT 1 VIEW  Result Date: 06/23/2022 CLINICAL DATA:   Shortness of breath EXAM: PORTABLE CHEST 1 VIEW COMPARISON:  Radiograph 06/22/2022 FINDINGS: Right neck catheter tip overlies the mid SVC. Unchanged cardiomediastinal silhouette. Feeding tube passes below the diaphragm, tip excluded by collimation. Mild perihilar predominant interstitial opacities, unchanged from prior. No new airspace disease. No pleural effusion or pneumothorax. Bones are unchanged. IMPRESSION: Mild perihilar predominant interstitial opacities, unchanged from prior, could be pulmonary edema or atypical infection. No new airspace disease. Electronically Signed   By: Maurine Simmering M.D.   On: 06/23/2022 08:36   DG CHEST PORT 1 VIEW  Result Date: 06/22/2022 CLINICAL DATA:  Shortness of breath. EXAM: PORTABLE CHEST 1 VIEW COMPARISON:  Chest radiograph dated 06/21/2022. FINDINGS: With sided Port-A-Cath with tip over central SVC. Feeding tube extends below the diaphragm with tip beyond the inferior margin of the image. Mild diffuse interstitial prominence and perihilar streaky densities may represent edema. Developing infiltrate is not excluded clinical correlation recommended. No consolidative changes with there is no pleural effusion pneumothorax. Stable cardiac silhouette. No acute osseous pathology. IMPRESSION: Mild diffuse interstitial prominence and perihilar streaky densities may represent edema or developing infiltrate. Electronically Signed   By: Laren Everts.D.  On: 06/22/2022 19:31    Medications:  feeding supplement (JEVITY 1.5 CAL/FIBER) 1,000 mL (06/23/22 1634)    aspirin  81 mg Per Tube Daily   Chlorhexidine Gluconate Cloth  6 each Topical Q0600   clopidogrel  75 mg Per Tube Daily   darbepoetin (ARANESP) injection - DIALYSIS  100 mcg Subcutaneous Q Thu-1800   diclofenac Sodium  4 g Topical Once   famotidine  20 mg Per Tube Daily   feeding supplement (PROSource TF20)  60 mL Per Tube Daily   heparin  5,000 Units Subcutaneous Q8H   hydrALAZINE  50 mg Per Tube Q8H    labetalol  300 mg Per Tube BID   leptospermum manuka honey  1 Application Topical Daily   multivitamin  1 tablet Per Tube QHS   mouth rinse  15 mL Mouth Rinse 4 times per day    Dialysis Orders: MWF at [facility] [address]  3.5h  48kg  2.2/5 bath  300/600  Hep none R AVG/TDC (AVG ok to use reportedly)    CXR 11/10- bilat splotchy perihilar disease, edema vs infxn, improved compared to 11/8 film   Assessment/ Plan: 1.  Acute hypoxic respiratory failure: due to combination of pulmonary edema/volume overload with bronchospasm along with influenza +/- and probable HCAP.  Serial CXR's showed resolution of infiltrates. Resolved and stable on RA.  2. Altered mental status: per staff at [place] [facility] is not unusual for him not to respond verbally at the OP unit.  3.  ESRD: on HD MWF.  HD Sun/Tues/Fri this week per Thanksgiving Holiday schedule.  Next HD 11/24; TDC 2K 2L UF max, no heparin 4. HD access: had TDC troubles initially but working now. We were told that the R AVG was ready to be used as well but we haven't been using here.  5.  HTN/ vol: Blood pressure good.Weights labile likely sig under dry wt. BP's not tolerating further UF. Minimal UF with HD.  6.  Secondary hyperparathyroidism: HOld sevelamer as P < 3.0 7.  Anemia of chronic disease: Hemoglobin 8.1.  aranesp 167mcg weekly.  No iron for now 8.  GOC - pt very frail, not interacting very much verbally. Appreciate palliative assistance. Poor prognosis.   Rexene Agent, MD  [facility] 06/24/2022,10:16 AM  LOS: 16 days

## 2022-06-25 DIAGNOSIS — J189 Pneumonia, unspecified organism: Secondary | ICD-10-CM | POA: Diagnosis not present

## 2022-06-25 LAB — CBC
HCT: 23.3 % — ABNORMAL LOW (ref 39.0–52.0)
Hemoglobin: 7.6 g/dL — ABNORMAL LOW (ref 13.0–17.0)
MCH: 28.1 pg (ref 26.0–34.0)
MCHC: 32.6 g/dL (ref 30.0–36.0)
MCV: 86.3 fL (ref 80.0–100.0)
Platelets: 329 10*3/uL (ref 150–400)
RBC: 2.7 MIL/uL — ABNORMAL LOW (ref 4.22–5.81)
RDW: 15.6 % — ABNORMAL HIGH (ref 11.5–15.5)
WBC: 8.6 10*3/uL (ref 4.0–10.5)
nRBC: 0 % (ref 0.0–0.2)

## 2022-06-25 LAB — BASIC METABOLIC PANEL
Anion gap: 12 (ref 5–15)
BUN: 84 mg/dL — ABNORMAL HIGH (ref 6–20)
CO2: 29 mmol/L (ref 22–32)
Calcium: 8.9 mg/dL (ref 8.9–10.3)
Chloride: 94 mmol/L — ABNORMAL LOW (ref 98–111)
Creatinine, Ser: 5.7 mg/dL — ABNORMAL HIGH (ref 0.61–1.24)
GFR, Estimated: 11 mL/min — ABNORMAL LOW (ref >60)
Glucose, Bld: 122 mg/dL — ABNORMAL HIGH (ref 70–99)
Potassium: 5.4 mmol/L — ABNORMAL HIGH (ref 3.5–5.1)
Sodium: 135 mmol/L (ref 135–145)

## 2022-06-25 LAB — GLUCOSE, CAPILLARY
Glucose-Capillary: 110 mg/dL — ABNORMAL HIGH (ref 70–99)
Glucose-Capillary: 111 mg/dL — ABNORMAL HIGH (ref 70–99)
Glucose-Capillary: 102 mg/dL — ABNORMAL HIGH (ref 70–99)
Glucose-Capillary: 122 mg/dL — ABNORMAL HIGH (ref 70–99)

## 2022-06-25 LAB — MAGNESIUM: Magnesium: 2.4 mg/dL (ref 1.7–2.4)

## 2022-06-25 MED ORDER — THIAMINE HCL 100 MG/ML IJ SOLN
100.0000 mg | Freq: Every day | INTRAMUSCULAR | Status: DC
  Administered 2022-06-25 – 2022-07-05 (×11): 100 mg via INTRAVENOUS
  Filled 2022-06-25 (×11): qty 2

## 2022-06-25 MED ORDER — HEPARIN SODIUM (PORCINE) 1000 UNIT/ML IJ SOLN
INTRAMUSCULAR | Status: AC
  Filled 2022-06-25: qty 2

## 2022-06-25 NOTE — Progress Notes (Signed)
[facility] Progress Note   Subjective:    No interval events Denies problems For HD today  Objective Vitals:   06/24/22 1622 06/24/22 1925 06/25/22 0013 06/25/22 0404  BP: 104/62 117/65 (!) 103/56 116/65  Pulse: 91 92 91 90  Resp: 20 (!) 31 (!) 34 (!) 33  Temp: 97.7 F (36.5 C) 98 F (36.7 C) 97.7 F (36.5 C) 97.7 F (36.5 C)  TempSrc: Oral Oral Oral Oral  SpO2: 99% 100% 99% 97%  Weight:   49.5 kg   Height:       Physical Exam General:chronically ill appearing, cachetic, frail male in NAD Heart:RRR Lungs:CTAB anteriorly  Abdomen:soft, NTND Extremities:no LE edema Dialysis Access: TDC, LU AVG +b/t   Filed Weights   06/23/22 2359 06/24/22 0445 06/25/22 0013  Weight: 48 kg 48 kg 49.5 kg    Intake/Output Summary (Last 24 hours) at 06/25/2022 1012 Last data filed at 06/24/2022 2100 Gross per 24 hour  Intake 100 ml  Output --  Net 100 ml     Additional Objective Labs: Basic Metabolic Panel: Recent Labs  Lab 06/21/22 0017 06/22/22 0024 06/23/22 0051 06/24/22 0026 06/25/22 0029  NA 135 134* 135 133* 135  K 3.4* 3.8 3.8 4.3 5.4*  CL 95* 95* 96* 92* 94*  CO2 27 27 30 28 29   GLUCOSE 89 97 107* 129* 122*  BUN 35* 51* 35* 59* 84*  CREATININE 3.41* 4.75* 3.28* 4.68* 5.70*  CALCIUM 9.0 9.3 9.0 9.1 8.9  PHOS 2.2* 3.5 2.4*  --   --     Liver Function Tests: Recent Labs  Lab 06/22/22 0024 06/23/22 0051 06/24/22 0026  AST 37 46* 39  ALT 27 25 24   ALKPHOS 71 75 67  BILITOT 0.3 0.1* 0.4  PROT 5.9* 6.2* 6.0*  ALBUMIN 1.7* 1.7* 1.7*    CBC: Recent Labs  Lab 06/21/22 0017 06/22/22 0024 06/23/22 0051 06/24/22 0026 06/25/22 0029  WBC 4.7 5.7 6.0 6.9 8.6  NEUTROABS 2.6 3.1 3.3  --   --   HGB 7.5* 8.2* 8.1* 7.4* 7.6*  HCT 23.2* 25.4* 26.0* 23.2* 23.3*  MCV 87.9 86.7 87.5 86.6 86.3  PLT 210 235 283 284 329    CBG: Recent Labs  Lab 06/24/22 1046 06/24/22 1619 06/24/22 2016 06/25/22 0401 06/25/22 0738  GLUCAP 106* 118* 127*  110* 111*     Studies/Results: No results found.  Medications:  feeding supplement (JEVITY 1.5 CAL/FIBER) 1,000 mL (06/24/22 1618)    aspirin  81 mg Per Tube Daily   Chlorhexidine Gluconate Cloth  6 each Topical Q0600   clopidogrel  75 mg Per Tube Daily   darbepoetin (ARANESP) injection - DIALYSIS  100 mcg Subcutaneous Q Thu-1800   diclofenac Sodium  4 g Topical Once   famotidine  20 mg Per Tube Daily   feeding supplement (PROSource TF20)  60 mL Per Tube Daily   heparin  5,000 Units Subcutaneous Q8H   hydrALAZINE  50 mg Per Tube Q8H   labetalol  300 mg Per Tube BID   leptospermum manuka honey  1 Application Topical Daily   multivitamin  1 tablet Per Tube QHS   mouth rinse  15 mL Mouth Rinse 4 times per day   thiamine (VITAMIN B1) injection  100 mg Intravenous Daily    Dialysis Orders: MWF at [facility] [address]  3.5h  48kg  2.2/5 bath  300/600  Hep none R AVG/TDC (AVG ok to use reportedly)    CXR 11/10- bilat splotchy  perihilar disease, edema vs infxn, improved compared to 11/8 film   Assessment/ Plan: 1.  Acute hypoxic respiratory failure: due to combination of pulmonary edema/volume overload with bronchospasm along with influenza +/- and probable HCAP.  Serial CXR's showed resolution of infiltrates. Resolved and stable on RA.  2. Altered mental status: per staff at [place] [facility] is not unusual for him not to respond verbally at the OP unit.  3.  ESRD: on HD MWF.  HD Sun/Tues/Fri this week per Thanksgiving Holiday schedule.  Next HD 11/24; TDC 2K 2L UF max, no heparin. Use AVG 4. HD access: Has AVG ready for use, will attempt and if cont success can have TDC removed.   5.  HTN/ vol: Blood pressure good.Weights labile likely sig under dry wt. BP's not tolerating further UF. Minimal UF with HD.  6.  Secondary hyperparathyroidism: HOld sevelamer as P < 3.0 7.  Anemia of chronic disease: Hemoglobin 8.1.  aranesp 173mcg weekly.  No iron for now 8.  GOC - pt very frail, not  interacting very much verbally. Appreciate palliative assistance. Poor prognosis.   Rexene Agent, MD  [facility] 06/25/2022,10:12 AM  LOS: 17 days

## 2022-06-25 NOTE — Progress Notes (Signed)
PROGRESS NOTE  MOROCCO GIPE ZOX:[id number] DOB: 11-04-1960 DOA: 06/08/2022 PCP: Pcp, No   LOS: 17 days   Brief Narrative / Interim history: 61 year old male with CVA, HTN, ESRD on HD MWF, sent from his SNF due to shortness of breath and hypertensive urgency.  He was hypoxic requiring up to 10 L nasal cannula and had pulmonary edema on the chest x-ray.  He was initially in the ICU, underwent emergent dialysis and required Cleviprex infusion for hypertensive urgency.  With improvement he was transferred to the hospitalist service on 11/10.  Hospital course complicated by poor mentation, persistent encephalopathy and failed a swallow eval.  Palliative care was consulted and multiple GOC were held, he is now DNR and family wishes for PEG tube which replaced next week.  Subjective / 24h Interval events: Remains alert but does not engage in conversations  Assesement and Plan: Principal Problem:   HCAP (healthcare-associated pneumonia) Active Problems:   ESRD (end stage renal disease) ([place])   Hypertensive urgency   Influenza A   Acute respiratory failure with hypoxia (HCC)   Protein-calorie malnutrition, severe  Principal problem Acute hypoxic respiratory failure due to multifocal pneumonia due to influenza A as well as superimposed acute pulmonary edema in the setting of fluid overload in a dialysis patient-respiratory status overall improving, currently he is on room air.  He completed a course of Tamiflu as well as empiric antibiotics.  He is afebrile now, WBC normal, monitor off antibiotics, he completed a 10-day course on 11/17.  Afebrile overnight  Active problems  Hypertensive Urgency -patient required to be in ICU on a Cleviprex drip, currently on labetalol, hydralazine with improvement in his blood pressure.  Blood pressure is stable this morning  ESRD on HD-management as per nephrology.  Hypophosphatemia, hypokalemia and hypomagnesemia -Likely to be corrected in dialysis and this  is now improved.  Continue to monitor, trend, on HD   Poor p.o. intake -Has been refusing to eat and not really eating very much.  PEG tube next week due to long holiday   H/o CVA -On Aspirin and Clopidogrel , on dysphagia 1 diet but not eating very well   Anemia of Chronic Kidney Disease -Stable   Hypoalbuminemia -noted   Acute Metabolic Encephalopathy-Probably sec to influenza A infection, overall proving but still does not engage much on my evaluation   Abnormal LFTs -minimal, improving   Underweight/ Severe Malnutrition in the Context of Chronic Illness/cachexia -on tube feeds  Pressure Ulcers, not present on admission -WOC RN consulted. Continue silicone foam to the left ear as well as leave the left medial foot wounds open to the air as well as debridement agent and antimicrobial to sacrum daily and they are recommending the patient can be turned side to side and they are not can add an air mattress.  The wound care nurse also recommend Prevalon boots in place for offloading heels but recommended the staff needing to be cautious when applying straps and a foot placement when the areas have appeared.  Scheduled Meds:  aspirin  81 mg Per Tube Daily   Chlorhexidine Gluconate Cloth  6 each Topical Q0600   clopidogrel  75 mg Per Tube Daily   darbepoetin (ARANESP) injection - DIALYSIS  100 mcg Subcutaneous Q Thu-1800   diclofenac Sodium  4 g Topical Once   famotidine  20 mg Per Tube Daily   feeding supplement (PROSource TF20)  60 mL Per Tube Daily   heparin  5,000 Units Subcutaneous Q8H  hydrALAZINE  50 mg Per Tube Q8H   labetalol  300 mg Per Tube BID   leptospermum manuka honey  1 Application Topical Daily   multivitamin  1 tablet Per Tube QHS   mouth rinse  15 mL Mouth Rinse 4 times per day   thiamine (VITAMIN B1) injection  100 mg Intravenous Daily   Continuous Infusions:  feeding supplement (JEVITY 1.5 CAL/FIBER) 1,000 mL (06/24/22 1618)   PRN Meds:.acetaminophen,  acetaminophen, albuterol, docusate sodium, guaiFENesin, hydrALAZINE, hydrOXYzine, labetalol, mouth rinse, polyethylene glycol  Current Outpatient Medications  Medication Instructions   acetaminophen (TYLENOL) 650 mg, Oral, Every 6 hours PRN   albuterol (PROVENTIL) 2.5 mg, Nebulization, Every 6 hours PRN   amLODipine (NORVASC) 10 mg, Oral, Daily   aspirin EC 81 mg, Oral, Daily, Swallow whole.   atorvastatin (LIPITOR) 80 mg, Oral, Daily at bedtime   clopidogrel (PLAVIX) 75 mg, Oral, Daily   ergocalciferol (VITAMIN D2) 50,000 Units, Every Tue   feeding supplement (ENSURE ENLIVE / ENSURE PLUS) LIQD 237 mLs, Oral, 2 times daily between meals   hydrALAZINE (APRESOLINE) 50 mg, Oral, Every 8 hours   labetalol (NORMODYNE) 300 mg, Oral, 2 times daily   melatonin 3 mg, Oral, Daily at bedtime   MULTIPLE VITAMIN PO 1 tablet, Oral, Daily at bedtime   multivitamin (RENA-VIT) TABS tablet 1 tablet, Oral, Daily at bedtime   Nutritional Supplements (NEPRO PO) Oral, 2 times daily, AHR Nepro   pantoprazole (PROTONIX) 40 mg, Oral, Daily   sevelamer carbonate (RENVELA) 800 mg, Oral, 3 times daily with meals   UNABLE TO FIND 30 mLs, Oral, 2 times daily, Liquid protein     Diet Orders (From admission, onward)     Start     Ordered   06/24/22 1100  Diet clear liquid Room service appropriate? Yes; Fluid consistency: Thin  Diet effective now       Question Answer Comment  Room service appropriate? Yes   Fluid consistency: Thin      06/24/22 1100            DVT prophylaxis: heparin injection 5,000 Units Start: 06/08/22 2200 SCDs Start: 06/08/22 1543   Lab Results  Component Value Date   PLT 329 06/25/2022      Code Status: DNR  Family Communication: no family at bedside   Status is: Inpatient  Remains inpatient appropriate because: severity of illness  Level of care: Progressive  Consultants:  Palliative PCCM  Objective: Vitals:   06/24/22 1622 06/24/22 1925 06/25/22 0013 06/25/22  0404  BP: 104/62 117/65 (!) 103/56 116/65  Pulse: 91 92 91 90  Resp: 20 (!) 31 (!) 34 (!) 33  Temp: 97.7 F (36.5 C) 98 F (36.7 C) 97.7 F (36.5 C) 97.7 F (36.5 C)  TempSrc: Oral Oral Oral Oral  SpO2: 99% 100% 99% 97%  Weight:   49.5 kg   Height:        Intake/Output Summary (Last 24 hours) at 06/25/2022 0954 Last data filed at 06/24/2022 2100 Gross per 24 hour  Intake 100 ml  Output --  Net 100 ml    Wt Readings from Last 3 Encounters:  06/25/22 49.5 kg  04/12/22 48.2 kg  01/01/22 52.3 kg    Examination:  Constitutional: NAD Eyes: lids and conjunctivae normal, no scleral icterus ENMT: mmm Neck: normal, supple Respiratory: clear to auscultation bilaterally, no wheezing, no crackles. Normal respiratory effort.  Cardiovascular: Regular rate and rhythm, no murmurs / rubs / gallops. No LE edema. Abdomen: soft,  no distention, no tenderness. Bowel sounds positive.  Skin: no rashes Neurologic: no focal deficits, equal strength  Data Reviewed: I have independently reviewed following labs and imaging studies   CBC Recent Labs  Lab 06/19/22 0626 06/20/22 0629 06/21/22 0017 06/22/22 0024 06/23/22 0051 06/24/22 0026 06/25/22 0029  WBC 4.3 4.8 4.7 5.7 6.0 6.9 8.6  HGB 8.2* 8.1* 7.5* 8.2* 8.1* 7.4* 7.6*  HCT 25.7* 25.7* 23.2* 25.4* 26.0* 23.2* 23.3*  PLT 203 223 210 235 283 284 329  MCV 88.9 88.3 87.9 86.7 87.5 86.6 86.3  MCH 28.4 27.8 28.4 28.0 27.3 27.6 28.1  MCHC 31.9 31.5 32.3 32.3 31.2 31.9 32.6  RDW 15.1 15.1 14.9 15.0 15.3 15.3 15.6*  LYMPHSABS 0.7 1.1 1.5 1.9 1.8  --   --   MONOABS 0.4 0.6 0.5 0.6 0.6  --   --   EOSABS 0.1 0.1 0.1 0.1 0.2  --   --   BASOSABS 0.0 0.0 0.0 0.0 0.0  --   --      Recent Labs  Lab 06/20/22 0629 06/21/22 0017 06/22/22 0024 06/23/22 0051 06/24/22 0026 06/25/22 0029  NA 135 135 134* 135 133* 135  K 3.7 3.4* 3.8 3.8 4.3 5.4*  CL 96* 95* 95* 96* 92* 94*  CO2 25 27 27 30 28 29   GLUCOSE 101* 89 97 107* 129* 122*  BUN  51* 35* 51* 35* 59* 84*  CREATININE 4.80* 3.41* 4.75* 3.28* 4.68* 5.70*  CALCIUM 9.1 9.0 9.3 9.0 9.1 8.9  AST 52* 44* 37 46* 39  --   ALT 33 29 27 25 24   --   ALKPHOS 65 62 71 75 67  --   BILITOT 0.4 0.4 0.3 0.1* 0.4  --   ALBUMIN 1.8* 1.7* 1.7* 1.7* 1.7*  --   MG 1.8 1.6* 1.9 1.7  --  2.4     ------------------------------------------------------------------------------------------------------------------ No results for input(s): "CHOL", "HDL", "LDLCALC", "TRIG", "CHOLHDL", "LDLDIRECT" in the last 72 hours.  No results found for: "HGBA1C" ------------------------------------------------------------------------------------------------------------------ No results for input(s): "TSH", "T4TOTAL", "T3FREE", "THYROIDAB" in the last 72 hours.  Invalid input(s): "FREET3"  Cardiac Enzymes No results for input(s): "CKMB", "TROPONINI", "MYOGLOBIN" in the last 168 hours.  Invalid input(s): "CK" ------------------------------------------------------------------------------------------------------------------    Component Value Date/Time   BNP >4,500.0 (H) 04/04/2022 0513    CBG: Recent Labs  Lab 06/24/22 1046 06/24/22 1619 06/24/22 2016 06/25/22 0401 06/25/22 0738  GLUCAP 106* 118* 127* 110* 111*     No results found for this or any previous visit (from the past 240 hour(s)).    Radiology Studies: No results found.   Marzetta Board, MD, PhD [facility]  Between 7 am - 7 pm I am available, please contact me via Amion (for emergencies) or Securechat (non urgent messages)  Between 7 pm - 7 am I am not available, please contact night coverage MD/APP via Amion

## 2022-06-26 DIAGNOSIS — J189 Pneumonia, unspecified organism: Secondary | ICD-10-CM | POA: Diagnosis not present

## 2022-06-26 LAB — GLUCOSE, CAPILLARY
Glucose-Capillary: 101 mg/dL — ABNORMAL HIGH (ref 70–99)
Glucose-Capillary: 104 mg/dL — ABNORMAL HIGH (ref 70–99)
Glucose-Capillary: 106 mg/dL — ABNORMAL HIGH (ref 70–99)
Glucose-Capillary: 108 mg/dL — ABNORMAL HIGH (ref 70–99)
Glucose-Capillary: 113 mg/dL — ABNORMAL HIGH (ref 70–99)
Glucose-Capillary: 120 mg/dL — ABNORMAL HIGH (ref 70–99)
Glucose-Capillary: 122 mg/dL — ABNORMAL HIGH (ref 70–99)
Glucose-Capillary: 85 mg/dL (ref 70–99)

## 2022-06-26 MED ORDER — SODIUM CHLORIDE 0.9 % IV SOLN
125.0000 mg | Freq: Once | INTRAVENOUS | Status: AC
  Administered 2022-06-26: 125 mg via INTRAVENOUS
  Filled 2022-06-26: qty 10

## 2022-06-26 NOTE — Progress Notes (Signed)
[facility] Progress Note   Subjective:    HD yesterday IDH so not much UF Used AVG w/o problems  Objective Vitals:   06/25/22 2336 06/25/22 2354 06/26/22 0406 06/26/22 0709  BP:  127/68 112/64 116/62  Pulse:  99 92 100  Resp:  _0 Temp:  98.9 F (37.2 C) 98 F (36.7 C) 98.4 F (36.9 C)  TempSrc:  Axillary Oral Oral  SpO2:  98% 98% 98%  Weight: 49.8 kg  48.4 kg   Height:       Physical Exam General:chronically ill appearing, cachetic, frail male in NAD Heart:RRR Lungs:CTAB anteriorly  Abdomen:soft, NTND Extremities:no LE edema Dialysis Access: TDC, LU AVG +b/t   Filed Weights   06/25/22 1825 06/25/22 2336 06/26/22 0406  Weight: 49.8 kg 49.8 kg 48.4 kg    Intake/Output Summary (Last 24 hours) at 06/26/2022 0929 Last data filed at 06/25/2022 2256 Gross per 24 hour  Intake --  Output 100 ml  Net -100 ml     Additional Objective Labs: Basic Metabolic Panel: Recent Labs  Lab 06/21/22 0017 06/22/22 0024 06/23/22 0051 06/24/22 0026 06/25/22 0029  NA 135 134* 135 133* 135  K 3.4* 3.8 3.8 4.3 5.4*  CL 95* 95* 96* 92* 94*  CO2 _1 GLUCOSE 89 97 107* 129* 122*  BUN 35* 51* 35* 59* 84*  CREATININE 3.41* 4.75* 3.28* 4.68* 5.70*  CALCIUM 9.0 9.3 9.0 9.1 8.9  PHOS 2.2* 3.5 2.4*  --   --     Liver Function Tests: Recent Labs  Lab 06/22/22 0024 06/23/22 0051 06/24/22 0026  AST 37 46* 39  ALT _2 ALKPHOS 71 75 67  BILITOT 0.3 0.1* 0.4  PROT 5.9* 6.2* 6.0*  ALBUMIN 1.7* 1.7* 1.7*    CBC: Recent Labs  Lab 06/21/22 0017 06/22/22 0024 06/23/22 0051 06/24/22 0026 06/25/22 0029  WBC 4.7 5.7 6.0 6.9 8.6  NEUTROABS 2.6 3.1 3.3  --   --   HGB 7.5* 8.2* 8.1* 7.4* 7.6*  HCT 23.2* 25.4* 26.0* 23.2* 23.3*  MCV 87.9 86.7 87.5 86.6 86.3  PLT 210 235 283 284 329    CBG: Recent Labs  Lab 06/25/22 1137 06/25/22 1650 06/26/22 0012 06/26/22 0410 06/26/22 0740  GLUCAP 102* 122* 108* 113* 122*      Studies/Results: No results found.  Medications:  feeding supplement (JEVITY 1.5 CAL/FIBER) 1,000 mL (06/24/22 1618)    aspirin  81 mg Per Tube Daily   Chlorhexidine Gluconate Cloth  6 each Topical Q0600   clopidogrel  75 mg Per Tube Daily   darbepoetin (ARANESP) injection - DIALYSIS  100 mcg Subcutaneous Q Thu-1800   diclofenac Sodium  4 g Topical Once   famotidine  20 mg Per Tube Daily   feeding supplement (PROSource TF20)  60 mL Per Tube Daily   heparin  5,000 Units Subcutaneous Q8H   heparin sodium (porcine)       hydrALAZINE  50 mg Per Tube Q8H   labetalol  300 mg Per Tube BID   leptospermum manuka honey  1 Application Topical Daily   multivitamin  1 tablet Per Tube QHS   mouth rinse  15 mL Mouth Rinse 4 times per day   thiamine (VITAMIN B1) injection  100 mg Intravenous Daily    Dialysis Orders: MWF at [facility] [address]  3.5h  48kg  2.2/5 bath  300/600  Hep none R AVG/TDC (AVG ok to use reportedly)  CXR 11/10- bilat splotchy perihilar disease, edema vs infxn, improved compared to 11/8 film   Assessment/ Plan: 1.  Resolved acute hypoxic respiratory failure: due to combination of pulmonary edema/volume overload with bronchospasm along with influenza +/- and probable HCAP.  Serial CXR's showed resolution of infiltrates. Resolved and stable on RA.  2. Altered mental status: per staff at [place] [facility] is not unusual for him not to respond verbally at the OP unit.  3.  ESRD: on HD MWF.  HD Sun/Tues/Fri this week per Thanksgiving Holiday schedule.  Next HD 11/24; TDC 2K 2L UF max, no heparin. Use AVG 4. HD access: Has AVG with successful use.  If again no issues on 11/27 prob can arrrange TDC removal prior to DC>   5.  HTN/ vol: Blood pressure good.Weights labile likely sig under dry wt. BP's not tolerating further UF. Minimal UF with HD.  6.  Secondary hyperparathyroidism: HOlding sevelamer as P < 3.0 7.  Anemia of chronic disease: Hemoglobin 76.  aranesp 170mg  weekly.  TSAT 12% will start IV Fe ferrfic gluc 2518mIV x 4d 8.  GOC - pt very frail, not interacting very much verbally. Appreciate palliative assistance. Poor prognosis.   RyRexene AgentMD  [facility] 06/26/2022,9:29 AM  LOS: 18 days

## 2022-06-26 NOTE — Progress Notes (Signed)
PROGRESS NOTE  Cody Macdonald XBD:[id number] DOB: 08/06/1960 DOA: 06/08/2022 PCP: Pcp, No   LOS: 18 days   Brief Narrative / Interim history: 61 year old male with CVA, HTN, ESRD on HD MWF, sent from his SNF due to shortness of breath and hypertensive urgency.  He was hypoxic requiring up to 10 L nasal cannula and had pulmonary edema on the chest x-ray.  He was initially in the ICU, underwent emergent dialysis and required Cleviprex infusion for hypertensive urgency.  With improvement he was transferred to the hospitalist service on 11/10.  Hospital course complicated by poor mentation, persistent encephalopathy and failed a swallow eval.  Palliative care was consulted and multiple GOC were held, he is now DNR and family wishes for PEG tube which replaced next week.  Subjective / 24h Interval events: Alert, weak.  Talking more today  Assesement and Plan: Principal Problem:   HCAP (healthcare-associated pneumonia) Active Problems:   ESRD (end stage renal disease) ([place])   Hypertensive urgency   Influenza A   Acute respiratory failure with hypoxia (HCC)   Protein-calorie malnutrition, severe  Principal problem Acute hypoxic respiratory failure due to multifocal pneumonia due to influenza A as well as superimposed acute pulmonary edema in the setting of fluid overload in a dialysis patient-respiratory status overall improving, currently he is on room air.  He completed a course of Tamiflu as well as empiric antibiotics.  He is afebrile now, WBC normal, monitor off antibiotics, he completed a 10-day course on 11/17.  Remains afebrile and otherwise stable  Active problems  Hypertensive Urgency -patient required to be in ICU on a Cleviprex drip, currently on labetalol, hydralazine with improvement in his blood pressure.  Blood pressure is stable this morning  ESRD on HD-management as per nephrology.  Hypophosphatemia, hypokalemia and hypomagnesemia -Likely to be corrected in dialysis and  this is now improved.  Continue to monitor, trend, on HD   Poor p.o. intake -Has been refusing to eat and not really eating very much.  PEG tube next week due to long holiday   H/o CVA -On Aspirin and Clopidogrel , on dysphagia 1 diet but not eating very well   Anemia of Chronic Kidney Disease -Stable   Hypoalbuminemia -noted   Acute Metabolic Encephalopathy-Probably sec to influenza A infection, overall proving but still does not engage much on my evaluation   Abnormal LFTs -minimal, improving   Underweight/ Severe Malnutrition in the Context of Chronic Illness/cachexia -on tube feeds  Pressure Ulcers, not present on admission -WOC RN consulted. Continue silicone foam to the left ear as well as leave the left medial foot wounds open to the air as well as debridement agent and antimicrobial to sacrum daily and they are recommending the patient can be turned side to side and they are not can add an air mattress.  The wound care nurse also recommend Prevalon boots in place for offloading heels but recommended the staff needing to be cautious when applying straps and a foot placement when the areas have appeared.  Scheduled Meds:  aspirin  81 mg Per Tube Daily   Chlorhexidine Gluconate Cloth  6 each Topical Q0600   clopidogrel  75 mg Per Tube Daily   darbepoetin (ARANESP) injection - DIALYSIS  100 mcg Subcutaneous Q Thu-1800   diclofenac Sodium  4 g Topical Once   famotidine  20 mg Per Tube Daily   feeding supplement (PROSource TF20)  60 mL Per Tube Daily   heparin  5,000 Units Subcutaneous Q8H  heparin sodium (porcine)       hydrALAZINE  50 mg Per Tube Q8H   labetalol  300 mg Per Tube BID   leptospermum manuka honey  1 Application Topical Daily   multivitamin  1 tablet Per Tube QHS   mouth rinse  15 mL Mouth Rinse 4 times per day   thiamine (VITAMIN B1) injection  100 mg Intravenous Daily   Continuous Infusions:  feeding supplement (JEVITY 1.5 CAL/FIBER) 1,000 mL (06/24/22 1618)    ferric gluconate (FERRLECIT) IVPB     PRN Meds:.acetaminophen, acetaminophen, albuterol, docusate sodium, guaiFENesin, heparin sodium (porcine), hydrALAZINE, hydrOXYzine, labetalol, mouth rinse, polyethylene glycol  Current Outpatient Medications  Medication Instructions   acetaminophen (TYLENOL) 650 mg, Oral, Every 6 hours PRN   albuterol (PROVENTIL) 2.5 mg, Nebulization, Every 6 hours PRN   amLODipine (NORVASC) 10 mg, Oral, Daily   aspirin EC 81 mg, Oral, Daily, Swallow whole.   atorvastatin (LIPITOR) 80 mg, Oral, Daily at bedtime   clopidogrel (PLAVIX) 75 mg, Oral, Daily   ergocalciferol (VITAMIN D2) 50,000 Units, Every Tue   feeding supplement (ENSURE ENLIVE / ENSURE PLUS) LIQD 237 mLs, Oral, 2 times daily between meals   hydrALAZINE (APRESOLINE) 50 mg, Oral, Every 8 hours   labetalol (NORMODYNE) 300 mg, Oral, 2 times daily   melatonin 3 mg, Oral, Daily at bedtime   MULTIPLE VITAMIN PO 1 tablet, Oral, Daily at bedtime   multivitamin (RENA-VIT) TABS tablet 1 tablet, Oral, Daily at bedtime   Nutritional Supplements (NEPRO PO) Oral, 2 times daily, AHR Nepro   pantoprazole (PROTONIX) 40 mg, Oral, Daily   sevelamer carbonate (RENVELA) 800 mg, Oral, 3 times daily with meals   UNABLE TO FIND 30 mLs, Oral, 2 times daily, Liquid protein     Diet Orders (From admission, onward)     Start     Ordered   06/24/22 1100  Diet clear liquid Room service appropriate? Yes; Fluid consistency: Thin  Diet effective now       Question Answer Comment  Room service appropriate? Yes   Fluid consistency: Thin      06/24/22 1100            DVT prophylaxis: heparin injection 5,000 Units Start: 06/08/22 2200 SCDs Start: 06/08/22 1543   Lab Results  Component Value Date   PLT 329 06/25/2022      Code Status: DNR  Family Communication: no family at bedside   Status is: Inpatient  Remains inpatient appropriate because: severity of illness  Level of care: Progressive  Consultants:   Palliative PCCM  Objective: Vitals:   06/25/22 2336 06/25/22 2354 06/26/22 0406 06/26/22 0709  BP:  127/68 112/64 116/62  Pulse:  99 92 100  Resp:  19 20 17   Temp:  98.9 F (37.2 C) 98 F (36.7 C) 98.4 F (36.9 C)  TempSrc:  Axillary Oral Oral  SpO2:  98% 98% 98%  Weight: 49.8 kg  48.4 kg   Height:        Intake/Output Summary (Last 24 hours) at 06/26/2022 7124 Last data filed at 06/25/2022 2256 Gross per 24 hour  Intake --  Output 100 ml  Net -100 ml    Wt Readings from Last 3 Encounters:  06/26/22 48.4 kg  04/12/22 48.2 kg  01/01/22 52.3 kg    Examination:  Constitutional: nad Respiratory: cta Cardiovascular: RRR  Data Reviewed: I have independently reviewed following labs and imaging studies   CBC Recent Labs  Lab 06/20/22 0629 06/21/22 0017 06/22/22  1025 06/23/22 0051 06/24/22 0026 06/25/22 0029  WBC 4.8 4.7 5.7 6.0 6.9 8.6  HGB 8.1* 7.5* 8.2* 8.1* 7.4* 7.6*  HCT 25.7* 23.2* 25.4* 26.0* 23.2* 23.3*  PLT 223 210 235 283 284 329  MCV 88.3 87.9 86.7 87.5 86.6 86.3  MCH 27.8 28.4 28.0 27.3 27.6 28.1  MCHC 31.5 32.3 32.3 31.2 31.9 32.6  RDW 15.1 14.9 15.0 15.3 15.3 15.6*  LYMPHSABS 1.1 1.5 1.9 1.8  --   --   MONOABS 0.6 0.5 0.6 0.6  --   --   EOSABS 0.1 0.1 0.1 0.2  --   --   BASOSABS 0.0 0.0 0.0 0.0  --   --      Recent Labs  Lab 06/20/22 0629 06/21/22 0017 06/22/22 0024 06/23/22 0051 06/24/22 0026 06/25/22 0029  NA 135 135 134* 135 133* 135  K 3.7 3.4* 3.8 3.8 4.3 5.4*  CL 96* 95* 95* 96* 92* 94*  CO2 25 27 27 30 28 29   GLUCOSE 101* 89 97 107* 129* 122*  BUN 51* 35* 51* 35* 59* 84*  CREATININE 4.80* 3.41* 4.75* 3.28* 4.68* 5.70*  CALCIUM 9.1 9.0 9.3 9.0 9.1 8.9  AST 52* 44* 37 46* 39  --   ALT 33 29 27 25 24   --   ALKPHOS 65 62 71 75 67  --   BILITOT 0.4 0.4 0.3 0.1* 0.4  --   ALBUMIN 1.8* 1.7* 1.7* 1.7* 1.7*  --   MG 1.8 1.6* 1.9 1.7  --  2.4      ------------------------------------------------------------------------------------------------------------------ No results for input(s): "CHOL", "HDL", "LDLCALC", "TRIG", "CHOLHDL", "LDLDIRECT" in the last 72 hours.  No results found for: "HGBA1C" ------------------------------------------------------------------------------------------------------------------ No results for input(s): "TSH", "T4TOTAL", "T3FREE", "THYROIDAB" in the last 72 hours.  Invalid input(s): "FREET3"  Cardiac Enzymes No results for input(s): "CKMB", "TROPONINI", "MYOGLOBIN" in the last 168 hours.  Invalid input(s): "CK" ------------------------------------------------------------------------------------------------------------------    Component Value Date/Time   BNP >4,500.0 (H) 04/04/2022 0513    CBG: Recent Labs  Lab 06/25/22 1137 06/25/22 1650 06/26/22 0012 06/26/22 0410 06/26/22 0740  GLUCAP 102* 122* 108* 113* 122*     No results found for this or any previous visit (from the past 240 hour(s)).    Radiology Studies: No results found.   Marzetta Board, MD, PhD [facility]  Between 7 am - 7 pm I am available, please contact me via Amion (for emergencies) or Securechat (non urgent messages)  Between 7 pm - 7 am I am not available, please contact night coverage MD/APP via Amion

## 2022-06-27 ENCOUNTER — Inpatient Hospital Stay (HOSPITAL_COMMUNITY): Payer: Medicaid Other

## 2022-06-27 DIAGNOSIS — J189 Pneumonia, unspecified organism: Secondary | ICD-10-CM | POA: Diagnosis not present

## 2022-06-27 LAB — COMPREHENSIVE METABOLIC PANEL
ALT: 24 U/L (ref 0–44)
AST: 44 U/L — ABNORMAL HIGH (ref 15–41)
Albumin: 1.6 g/dL — ABNORMAL LOW (ref 3.5–5.0)
Alkaline Phosphatase: 83 U/L (ref 38–126)
Anion gap: 14 (ref 5–15)
BUN: 77 mg/dL — ABNORMAL HIGH (ref 6–20)
CO2: 28 mmol/L (ref 22–32)
Calcium: 8.6 mg/dL — ABNORMAL LOW (ref 8.9–10.3)
Chloride: 92 mmol/L — ABNORMAL LOW (ref 98–111)
Creatinine, Ser: 5.03 mg/dL — ABNORMAL HIGH (ref 0.61–1.24)
GFR, Estimated: 12 mL/min — ABNORMAL LOW (ref >60)
Glucose, Bld: 91 mg/dL (ref 70–99)
Potassium: 6.6 mmol/L (ref 3.5–5.1)
Sodium: 134 mmol/L — ABNORMAL LOW (ref 135–145)
Total Bilirubin: 0.4 mg/dL (ref 0.3–1.2)
Total Protein: 6.2 g/dL — ABNORMAL LOW (ref 6.5–8.1)

## 2022-06-27 LAB — CBC
HCT: 22 % — ABNORMAL LOW (ref 39.0–52.0)
Hemoglobin: 7.1 g/dL — ABNORMAL LOW (ref 13.0–17.0)
MCH: 27.6 pg (ref 26.0–34.0)
MCHC: 32.3 g/dL (ref 30.0–36.0)
MCV: 85.6 fL (ref 80.0–100.0)
Platelets: 395 10*3/uL (ref 150–400)
RBC: 2.57 MIL/uL — ABNORMAL LOW (ref 4.22–5.81)
RDW: 15.5 % (ref 11.5–15.5)
WBC: 10.4 10*3/uL (ref 4.0–10.5)
nRBC: 0 % (ref 0.0–0.2)

## 2022-06-27 LAB — GLUCOSE, CAPILLARY
Glucose-Capillary: 95 mg/dL (ref 70–99)
Glucose-Capillary: 86 mg/dL (ref 70–99)
Glucose-Capillary: 98 mg/dL (ref 70–99)
Glucose-Capillary: 102 mg/dL — ABNORMAL HIGH (ref 70–99)
Glucose-Capillary: 98 mg/dL (ref 70–99)

## 2022-06-27 LAB — VITAMIN B1: Vitamin B1 (Thiamine): 165.8 nmol/L (ref 66.5–200.0)

## 2022-06-27 MED ORDER — SODIUM CHLORIDE 0.9 % IV SOLN
1.5000 g | Freq: Two times a day (BID) | INTRAVENOUS | Status: DC
  Administered 2022-06-27 – 2022-06-28 (×4): 1.5 g via INTRAVENOUS
  Filled 2022-06-27 (×5): qty 4

## 2022-06-27 MED ORDER — HYDRALAZINE HCL 10 MG PO TABS: 10.0000 mg | ORAL_TABLET | Freq: Three times a day (TID) | ORAL | Status: DC

## 2022-06-27 MED ORDER — SODIUM ZIRCONIUM CYCLOSILICATE 10 G PO PACK
10.0000 g | PACK | Freq: Two times a day (BID) | ORAL | Status: AC
  Administered 2022-06-27: 10 g
  Filled 2022-06-27: qty 1

## 2022-06-27 NOTE — Progress Notes (Signed)
Pharmacy Antibiotic Note  Cody Macdonald is a 61 y.o. male admitted on 06/08/2022 with pneumonia.  Pharmacy has been consulted for Unasyn dosing as patient is thought to have aspirated overnight.  Patient is ESRD on HD MWF with next HD scheduled 11/27. Most recent WBC 8.6, T100.2 overnight and patient thought to have aspirated.   Plan: Start Unasyn 1.5g q12hrs x5 days  Monitor renal function, cultures, and overall clinical picture  Height: 5\' 8"  (172.7 cm) Weight: 50 kg (110 lb 3.7 oz) IBW/kg (Calculated) : 68.4  Temp (24hrs), Avg:99.4 F (37.4 C), Min:97.5 F (36.4 C), Max:101.1 F (38.4 C)  Recent Labs  Lab 06/21/22 0017 06/22/22 0024 06/23/22 0051 06/24/22 0026 06/25/22 0029  WBC 4.7 5.7 6.0 6.9 8.6  CREATININE 3.41* 4.75* 3.28* 4.68* 5.70*    Estimated Creatinine Clearance: 9.7 mL/min (A) (by C-G formula based on SCr of 5.7 mg/dL (H)).    No Known Allergies  Antimicrobials this admission: Cefepime 11/7>>11/8, 11/13 >> 11/15, 11/17 Ceftriaxone 11/9 >> 11/13 Vancomycin 11/7, 11/13, 11/15  Unasyn 11/26>>(12/1)  Microbiology results: 11/7 flu A (+) 11/7 MRSA PCR: (-) 11/7 BCx: NG 11/13 BCx: NG 11/26 Bcx: in process    Thank you for allowing pharmacy to be a part of this patient's care.  Billey Gosling, PharmD PGY1 Pharmacy Resident 11/26/202311:06 AM

## 2022-06-27 NOTE — Progress Notes (Signed)
Potassium 6.6. MD informed

## 2022-06-27 NOTE — Progress Notes (Signed)
   06/26/22 2307  Assess: MEWS Score  Temp 100.1 F (37.8 C)  BP (!) 86/52  Resp (!) 30  SpO2 100 %  O2 Device Room Air  Patient Activity (if Appropriate) In bed  Assess: MEWS Score  MEWS Temp 0  MEWS Systolic 1  MEWS Pulse 1  MEWS RR 2  MEWS LOC 0  MEWS Score 4  MEWS Score Color Red  Assess: if the MEWS score is Yellow or Red  Were vital signs taken at a resting state? Yes  Focused Assessment Change from prior assessment (see assessment flowsheet)  Does the patient meet 2 or more of the SIRS criteria? Yes  Does the patient have a confirmed or suspected source of infection? No  Treat  Pain Scale Faces  Faces Pain Scale 0  Notify: Charge Nurse/RN  Name of Charge Nurse/RN Notified Fred  Date Charge Nurse/RN Notified 06/26/22  Time Charge Nurse/RN Notified 2313  Provider Notification  Provider Name/Title [place] Crane  Date Provider Notified 06/26/22  Time Provider Notified 2312  Method of Notification Page  Notification Reason Change in status  Provider response No new orders  Date of Provider Response 06/27/22  Time of Provider Response 0005  Notify: Rapid Response  Name of Rapid Response RN Notified mindy  Date Rapid Response Notified 06/26/22  Time Rapid Response Notified 2350  Document  Patient Outcome Other (Comment)  Progress note created (see row info) Yes  Assess: SIRS CRITERIA  SIRS Temperature  0  SIRS Pulse 1  SIRS Respirations  1  SIRS WBC 1  SIRS Score Sum  3   No change in pt mentation. Pt denies pain. Lung sounds diminished but clear. PRN medication for temperature already given.

## 2022-06-27 NOTE — Progress Notes (Addendum)
PROGRESS NOTE  Cody Macdonald IZT:[id number] DOB: 02-Mar-1961 DOA: 06/08/2022 PCP: Pcp, No   LOS: 19 days   Brief Narrative / Interim history: 61 year old male with CVA, HTN, ESRD on HD MWF, sent from his SNF due to shortness of breath and hypertensive urgency.  He was hypoxic requiring up to 10 L nasal cannula and had pulmonary edema on the chest x-ray.  He was initially in the ICU, underwent emergent dialysis and required Cleviprex infusion for hypertensive urgency.  With improvement he was transferred to the hospitalist service on 11/10.  Hospital course complicated by poor mentation, persistent encephalopathy and failed a swallow eval.  Palliative care was consulted and multiple GOC were held, he is now DNR and family wishes for PEG tube which replaced next week.  Subjective / 24h Interval events: Febrile last night to 100.8, down to 100.2 and afebrile this morning.  Also had an episode of hypotension with systolic in the 38S.  Assesement and Plan: Principal Problem:   HCAP (healthcare-associated pneumonia) Active Problems:   ESRD (end stage renal disease) ([place])   Hypertensive urgency   Influenza A   Acute respiratory failure with hypoxia (HCC)   Protein-calorie malnutrition, severe  Principal problem Acute hypoxic respiratory failure due to multifocal pneumonia due to influenza A as well as superimposed acute pulmonary edema in the setting of fluid overload in a dialysis patient-respiratory status overall improving, currently he is on room air.  He completed a course of Tamiflu as well as empiric antibiotics, finished a 10-day course on 11/17.   -Febrile last night, hypotensive.  Chest x-ray this morning with left lower lung opacity, suspicious for pneumonia.  He is at high risk for aspiration, start Unasyn for 5 days -Obtain blood cultures, CBC to evaluate leukocytosis  Active problems  Hypertensive Urgency -patient required to be in ICU on a Cleviprex drip, currently on labetalol,  hydralazine with improvement in his blood pressure.  Blood pressure is stable today  ESRD on HD-management as per nephrology.  Hypophosphatemia, hypokalemia and hypomagnesemia -Likely to be corrected in dialysis and this is now improved.  Continue to monitor, trend, on HD   Poor p.o. intake -Has been refusing to eat and not really eating very much.  PEG tube next week due to long holiday   H/o CVA -On Aspirin and Clopidogrel , on dysphagia 1 diet but not eating very well   Anemia of Chronic Kidney Disease -Stable   Hypoalbuminemia -noted   Acute Metabolic Encephalopathy-Probably sec to influenza A infection, overall proving but still does not engage much on my evaluation   Abnormal LFTs -minimal, improving   Underweight/ Severe Malnutrition in the Context of Chronic Illness/cachexia -on tube feeds  Pressure Ulcers, not present on admission -WOC RN consulted. Continue silicone foam to the left ear as well as leave the left medial foot wounds open to the air as well as debridement agent and antimicrobial to sacrum daily and they are recommending the patient can be turned side to side and they are not can add an air mattress.  The wound care nurse also recommend Prevalon boots in place for offloading heels but recommended the staff needing to be cautious when applying straps and a foot placement when the areas have appeared.  Scheduled Meds:  aspirin  81 mg Per Tube Daily   Chlorhexidine Gluconate Cloth  6 each Topical Q0600   clopidogrel  75 mg Per Tube Daily   darbepoetin (ARANESP) injection - DIALYSIS  100 mcg Subcutaneous Q Thu-1800  diclofenac Sodium  4 g Topical Once   famotidine  20 mg Per Tube Daily   feeding supplement (PROSource TF20)  60 mL Per Tube Daily   heparin  5,000 Units Subcutaneous Q8H   leptospermum manuka honey  1 Application Topical Daily   multivitamin  1 tablet Per Tube QHS   mouth rinse  15 mL Mouth Rinse 4 times per day   thiamine (VITAMIN B1) injection  100  mg Intravenous Daily   Continuous Infusions:  feeding supplement (JEVITY 1.5 CAL/FIBER) 1,000 mL (06/27/22 0940)   PRN Meds:.acetaminophen, acetaminophen, albuterol, docusate sodium, guaiFENesin, hydrALAZINE, hydrOXYzine, labetalol, mouth rinse, polyethylene glycol  Current Outpatient Medications  Medication Instructions   acetaminophen (TYLENOL) 650 mg, Oral, Every 6 hours PRN   albuterol (PROVENTIL) 2.5 mg, Nebulization, Every 6 hours PRN   amLODipine (NORVASC) 10 mg, Oral, Daily   aspirin EC 81 mg, Oral, Daily, Swallow whole.   atorvastatin (LIPITOR) 80 mg, Oral, Daily at bedtime   clopidogrel (PLAVIX) 75 mg, Oral, Daily   ergocalciferol (VITAMIN D2) 50,000 Units, Every Tue   feeding supplement (ENSURE ENLIVE / ENSURE PLUS) LIQD 237 mLs, Oral, 2 times daily between meals   hydrALAZINE (APRESOLINE) 50 mg, Oral, Every 8 hours   labetalol (NORMODYNE) 300 mg, Oral, 2 times daily   melatonin 3 mg, Oral, Daily at bedtime   MULTIPLE VITAMIN PO 1 tablet, Oral, Daily at bedtime   multivitamin (RENA-VIT) TABS tablet 1 tablet, Oral, Daily at bedtime   Nutritional Supplements (NEPRO PO) Oral, 2 times daily, AHR Nepro   pantoprazole (PROTONIX) 40 mg, Oral, Daily   sevelamer carbonate (RENVELA) 800 mg, Oral, 3 times daily with meals   UNABLE TO FIND 30 mLs, Oral, 2 times daily, Liquid protein     Diet Orders (From admission, onward)     Start     Ordered   06/24/22 1100  Diet clear liquid Room service appropriate? Yes; Fluid consistency: Thin  Diet effective now       Question Answer Comment  Room service appropriate? Yes   Fluid consistency: Thin      06/24/22 1100            DVT prophylaxis: heparin injection 5,000 Units Start: 06/08/22 2200 SCDs Start: 06/08/22 1543   Lab Results  Component Value Date   PLT 329 06/25/2022      Code Status: DNR  Family Communication: no family at bedside   Status is: Inpatient  Remains inpatient appropriate because: severity of  illness  Level of care: Progressive  Consultants:  Palliative PCCM  Objective: Vitals:   06/27/22 0100 06/27/22 0300 06/27/22 0357 06/27/22 0734  BP: (!) 100/56 109/60 (!) 111/59 133/68  Pulse: 82 92 91 98  Resp: 20 18 20 15   Temp: 97.9 F (36.6 C)  100.2 F (37.9 C) (!) 97.5 F (36.4 C)  TempSrc:   Axillary Oral  SpO2: 99% 98% 99% 100%  Weight:   50 kg   Height:        Intake/Output Summary (Last 24 hours) at 06/27/2022 1037 Last data filed at 06/27/2022 0951 Gross per 24 hour  Intake 90 ml  Output 0 ml  Net 90 ml    Wt Readings from Last 3 Encounters:  06/27/22 50 kg  04/12/22 48.2 kg  01/01/22 52.3 kg    Examination:  Constitutional: NAD Eyes: lids and conjunctivae normal, no scleral icterus ENMT: mmm Neck: normal, supple Respiratory: Diminished breath sounds, no wheezing. Cardiovascular: Regular rate and rhythm, no  murmurs / rubs / gallops. No LE edema. Abdomen: soft, no distention, no tenderness. Bowel sounds positive.   Data Reviewed: I have independently reviewed following labs and imaging studies   CBC Recent Labs  Lab 06/21/22 0017 06/22/22 0024 06/23/22 0051 06/24/22 0026 06/25/22 0029  WBC 4.7 5.7 6.0 6.9 8.6  HGB 7.5* 8.2* 8.1* 7.4* 7.6*  HCT 23.2* 25.4* 26.0* 23.2* 23.3*  PLT 210 235 283 284 329  MCV 87.9 86.7 87.5 86.6 86.3  MCH 28.4 28.0 27.3 27.6 28.1  MCHC 32.3 32.3 31.2 31.9 32.6  RDW 14.9 15.0 15.3 15.3 15.6*  LYMPHSABS 1.5 1.9 1.8  --   --   MONOABS 0.5 0.6 0.6  --   --   EOSABS 0.1 0.1 0.2  --   --   BASOSABS 0.0 0.0 0.0  --   --      Recent Labs  Lab 06/21/22 0017 06/22/22 0024 06/23/22 0051 06/24/22 0026 06/25/22 0029  NA 135 134* 135 133* 135  K 3.4* 3.8 3.8 4.3 5.4*  CL 95* 95* 96* 92* 94*  CO2 27 27 30 28 29   GLUCOSE 89 97 107* 129* 122*  BUN 35* 51* 35* 59* 84*  CREATININE 3.41* 4.75* 3.28* 4.68* 5.70*  CALCIUM 9.0 9.3 9.0 9.1 8.9  AST 44* 37 46* 39  --   ALT 29 27 25 24   --   ALKPHOS 62 71 75 67  --    BILITOT 0.4 0.3 0.1* 0.4  --   ALBUMIN 1.7* 1.7* 1.7* 1.7*  --   MG 1.6* 1.9 1.7  --  2.4     ------------------------------------------------------------------------------------------------------------------ No results for input(s): "CHOL", "HDL", "LDLCALC", "TRIG", "CHOLHDL", "LDLDIRECT" in the last 72 hours.  No results found for: "HGBA1C" ------------------------------------------------------------------------------------------------------------------ No results for input(s): "TSH", "T4TOTAL", "T3FREE", "THYROIDAB" in the last 72 hours.  Invalid input(s): "FREET3"  Cardiac Enzymes No results for input(s): "CKMB", "TROPONINI", "MYOGLOBIN" in the last 168 hours.  Invalid input(s): "CK" ------------------------------------------------------------------------------------------------------------------    Component Value Date/Time   BNP >4,500.0 (H) 04/04/2022 0513    CBG: Recent Labs  Lab 06/26/22 1957 06/26/22 2203 06/26/22 2354 06/27/22 0355 06/27/22 0800  GLUCAP 85 120* 106* 95 86     No results found for this or any previous visit (from the past 240 hour(s)).    Radiology Studies: No results found.   Marzetta Board, MD, PhD [facility]  Between 7 am - 7 pm I am available, please contact me via Amion (for emergencies) or Securechat (non urgent messages)  Between 7 pm - 7 am I am not available, please contact night coverage MD/APP via Amion

## 2022-06-27 NOTE — Progress Notes (Signed)
[facility] Progress Note   Subjective:    No issues Seen in room Doesn't have much to say T 100.2 overnight  Objective Vitals:   06/27/22 0100 06/27/22 0300 06/27/22 0357 06/27/22 0734  BP: (!) 100/56 109/60 (!) 111/59 133/68  Pulse: 82 92 91 98  Resp: 20 18 20 15   Temp: 97.9 F (36.6 C)  100.2 F (37.9 C) (!) 97.5 F (36.4 C)  TempSrc:   Axillary Oral  SpO2: 99% 98% 99% 100%  Weight:   50 kg   Height:       Physical Exam General:chronically ill appearing, cachetic, frail male in NAD Heart:RRR Lungs:CTAB anteriorly  Abdomen:soft, NTND Extremities:no LE edema Dialysis Access: TDC, LU AVG +b/t   Filed Weights   06/25/22 2336 06/26/22 0406 06/27/22 0357  Weight: 49.8 kg 48.4 kg 50 kg    Intake/Output Summary (Last 24 hours) at 06/27/2022 1004 Last data filed at 06/27/2022 0951 Gross per 24 hour  Intake 90 ml  Output 0 ml  Net 90 ml     Additional Objective Labs: Basic Metabolic Panel: Recent Labs  Lab 06/21/22 0017 06/22/22 0024 06/23/22 0051 06/24/22 0026 06/25/22 0029  NA 135 134* 135 133* 135  K 3.4* 3.8 3.8 4.3 5.4*  CL 95* 95* 96* 92* 94*  CO2 27 27 30 28 29   GLUCOSE 89 97 107* 129* 122*  BUN 35* 51* 35* 59* 84*  CREATININE 3.41* 4.75* 3.28* 4.68* 5.70*  CALCIUM 9.0 9.3 9.0 9.1 8.9  PHOS 2.2* 3.5 2.4*  --   --     Liver Function Tests: Recent Labs  Lab 06/22/22 0024 06/23/22 0051 06/24/22 0026  AST 37 46* 39  ALT 27 25 24   ALKPHOS 71 75 67  BILITOT 0.3 0.1* 0.4  PROT 5.9* 6.2* 6.0*  ALBUMIN 1.7* 1.7* 1.7*    CBC: Recent Labs  Lab 06/21/22 0017 06/22/22 0024 06/23/22 0051 06/24/22 0026 06/25/22 0029  WBC 4.7 5.7 6.0 6.9 8.6  NEUTROABS 2.6 3.1 3.3  --   --   HGB 7.5* 8.2* 8.1* 7.4* 7.6*  HCT 23.2* 25.4* 26.0* 23.2* 23.3*  MCV 87.9 86.7 87.5 86.6 86.3  PLT 210 235 283 284 329    CBG: Recent Labs  Lab 06/26/22 1957 06/26/22 2203 06/26/22 2354 06/27/22 0355 06/27/22 0800  GLUCAP 85 120* 106* 95 86      Studies/Results: No results found.  Medications:  feeding supplement (JEVITY 1.5 CAL/FIBER) 1,000 mL (06/27/22 0940)    aspirin  81 mg Per Tube Daily   Chlorhexidine Gluconate Cloth  6 each Topical Q0600   clopidogrel  75 mg Per Tube Daily   darbepoetin (ARANESP) injection - DIALYSIS  100 mcg Subcutaneous Q Thu-1800   diclofenac Sodium  4 g Topical Once   famotidine  20 mg Per Tube Daily   feeding supplement (PROSource TF20)  60 mL Per Tube Daily   heparin  5,000 Units Subcutaneous Q8H   leptospermum manuka honey  1 Application Topical Daily   multivitamin  1 tablet Per Tube QHS   mouth rinse  15 mL Mouth Rinse 4 times per day   thiamine (VITAMIN B1) injection  100 mg Intravenous Daily    Dialysis Orders: MWF at [facility] [address]  3.5h  48kg  2.2/5 bath  300/600  Hep none R AVG/TDC (AVG ok to use reportedly)    CXR 11/10- bilat splotchy perihilar disease, edema vs infxn, improved compared to 11/8 film   Assessment/ Plan: 1.  Resolved acute hypoxic respiratory failure: due to combination of pulmonary edema/volume overload with bronchospasm along with influenza +/- and probable HCAP.  Serial CXR's showed resolution of infiltrates. Resolved and stable on RA.  2. Altered mental status: per staff at [place] [facility] is not unusual for him not to respond verbally at the OP unit.  3.  ESRD: on HD MWF.  Next HD 11/27 on schedule: AVG 2K 2L UF max, no heparin. Use AVG 4. HD access: Has AVG with successful use at least twice.  If again no issues on 11/27 prob can arrrange TDC removal 5.  HTN/ vol: Blood pressure good.Weights labile likely sig under dry wt. BP's not tolerating further UF. Minimal UF with HD.  6.  Secondary hyperparathyroidism: HOlding sevelamer as P < 3.0 7.  Anemia of chronic disease: Hemoglobin 76.  aranesp 115mcg weekly.  TSAT 12% will start IV Fe ferrfic gluc 250mg  IV x 4d 8.  GOC - pt very frail, not interacting very much verbally. Appreciate palliative assistance.  Poor prognosis.   Rexene Agent, MD  [facility] 06/27/2022,10:04 AM  LOS: 19 days

## 2022-06-27 NOTE — Progress Notes (Signed)
Pt to HD via transport. CCMD informed. Report given to HD RN.

## 2022-06-28 DIAGNOSIS — J189 Pneumonia, unspecified organism: Secondary | ICD-10-CM | POA: Diagnosis not present

## 2022-06-28 LAB — GLUCOSE, CAPILLARY
Glucose-Capillary: 118 mg/dL — ABNORMAL HIGH (ref 70–99)
Glucose-Capillary: 108 mg/dL — ABNORMAL HIGH (ref 70–99)
Glucose-Capillary: 118 mg/dL — ABNORMAL HIGH (ref 70–99)
Glucose-Capillary: 115 mg/dL — ABNORMAL HIGH (ref 70–99)
Glucose-Capillary: 118 mg/dL — ABNORMAL HIGH (ref 70–99)

## 2022-06-28 MED ORDER — CEFAZOLIN SODIUM-DEXTROSE 2-4 GM/100ML-% IV SOLN
2.0000 g | INTRAVENOUS | Status: DC
  Filled 2022-06-28: qty 100

## 2022-06-28 MED ORDER — DARBEPOETIN ALFA 150 MCG/0.3ML IJ SOSY
150.0000 ug | PREFILLED_SYRINGE | INTRAMUSCULAR | Status: DC
  Administered 2022-07-01: 150 ug via SUBCUTANEOUS
  Filled 2022-06-28 (×2): qty 0.3

## 2022-06-28 MED ORDER — HEPARIN SODIUM (PORCINE) 1000 UNIT/ML IJ SOLN
INTRAMUSCULAR | Status: AC
  Filled 2022-06-28: qty 4

## 2022-06-28 NOTE — Progress Notes (Signed)
Speech Language Pathology Treatment: Dysphagia  Patient Details Name: Cody Macdonald MRN: [id number] DOB: Oct 23, 1960 Today's Date: 06/28/2022 Time: 1937-9024 SLP Time Calculation (min) (ACUTE ONLY): 10 min  Assessment / Plan / Recommendation Clinical Impression  Pt seen for limited skilled ST focusing on goals for improvement of dysphagia. Pt was sleeping soundly upon arrival of SLP, but awakened easily to verbal and gentle tactile stim. SLP explained rationale for the visit, and started to raise the head of the bed. Pt said (very clearly) "wait". When SLP tried to raise the head of the bed slightly more, he grimaced. SLP explained the need for upright positioning, but pt made no attempts to communicate again. SLP asked pt if he would like to have ice cream or water, and he closed his eyes. Attempted ice chips on his lips with no attempt to lick his lips or take the ice chip. RN informed. Apparently he is refusing several aspects of his care. SLP will continue to follow, however, he is unfortunately not making progress toward his goals.     HPI HPI: Pt is a 61 y.o. male who presented to [facility] ED 11/7 with dyspnea, hypertensive urgency, hyperkalemia and was transferred to [facility] for dialysis. CXR 11/8: Progressed bilateral multilobar airspace opacity now with early  consolidation suspected. Differential considerations include  asymmetric pulmonary edema, bilateral pneumonia, developing ARDS. Pt found to have influenza A. MBS 11/9 rec regular /thin liquids.  Cortrak placed 11/17 due to worsening mentation impacting PO intake. PMH: ESRD on HD MWF, CKD, CVA, HTN, Etoh abuse, tobacco abuse, anemia.      SLP Plan  Continue with current plan of care      Recommendations for follow up therapy are one component of a multi-disciplinary discharge planning process, led by the attending physician.  Recommendations may be updated based on patient status, additional functional criteria and insurance authorization.     Recommendations  Liquids provided via: Straw Medication Administration: Via alternative means Supervision: Trained caregiver to feed patient Compensations: Small sips/bites;Minimize environmental distractions Postural Changes and/or Swallow Maneuvers: Seated upright 90 degrees                Oral Care Recommendations: Oral care BID Follow Up Recommendations: Skilled nursing-short term rehab (<3 hours/day) Assistance recommended at discharge: Frequent or constant Supervision/Assistance SLP Visit Diagnosis: Dysphagia, unspecified (R13.10) Plan: Continue with current plan of care          Kallee Nam B. Quentin Ore, [facility], [place] Speech Language Pathologist Office: [phone]  Shonna Chock 06/28/2022, 3:13 PM

## 2022-06-28 NOTE — Progress Notes (Signed)
Received patient in bed to unit.  Alert.  Informed consent signed and in chart.   Treatment initiated: 2300 Treatment completed: 0310  Patient tolerated well.  Transported back to the room  Alert, without acute distress.  Hand-off given to patient's nurse.   Access used: catheter Access issues: venous elevated  Total UF removed: 557ml Medication(s) given: none Post HD VS: 132/64 100 28 Post HD weight: not obtained   [facility]

## 2022-06-28 NOTE — Progress Notes (Addendum)
IR was requested for G tube placement.   CT reviewed by Dr. Pascal Lux, anatomy amenable for percutaneous G tube placement.   Pt on Plavix 75 mg and ASA 81 mg, asked team to start holding Plavix tomorrow. Patient can be on ASA 81 mg if neccessary.   G tube placement is tentatively scheduled for Friday pending IR schedule.   PLAN - Check repeat blood cx, it must be no growth for at least 48 hrs - NPO/stop TF Friday MN - INR 1.3 on 06/08/22, LFT has been stable, will update on Friday - No AC/AP till G tube is done, patient can remain on ASA 81 mg if it is necessary due to hx of stroke  - currently on Unasyn till Thursday due to possible aspiration PNA, will need Ancef 2 g during procedure, order placed  - formal consult to follow    Please call IR for questions and concerns.    Armando Gang Floree Zuniga PA-C 06/28/2022 4:42 PM

## 2022-06-28 NOTE — Progress Notes (Signed)
PROGRESS NOTE  Cody Macdonald DDU:[id number] DOB: 1960-12-29 DOA: 06/08/2022 PCP: Pcp, No   LOS: 20 days   Brief Narrative / Interim history: 61 year old male with CVA, HTN, ESRD on HD MWF, sent from his SNF due to shortness of breath and hypertensive urgency.  He was hypoxic requiring up to 10 L nasal cannula and had pulmonary edema on the chest x-ray.  He was initially in the ICU, underwent emergent dialysis and required Cleviprex infusion for hypertensive urgency.  With improvement he was transferred to the hospitalist service on 11/10.  Hospital course complicated by poor mentation, persistent encephalopathy and failed a swallow eval.  Palliative care was consulted and multiple GOC were held, he is now DNR and family wishes for PEG tube which should be placed this week  Subjective / 24h Interval events: Fever curve improved.  Appears alert, comfortable, does not interact much  Assesement and Plan: Principal Problem:   HCAP (healthcare-associated pneumonia) Active Problems:   ESRD (end stage renal disease) ([place])   Hypertensive urgency   Influenza A   Acute respiratory failure with hypoxia (HCC)   Protein-calorie malnutrition, severe  Principal problem Acute hypoxic respiratory failure due to multifocal pneumonia due to influenza A as well as superimposed acute pulmonary edema in the setting of fluid overload in a dialysis patient-respiratory status overall improving, currently he is on room air.  He completed a course of Tamiflu as well as empiric antibiotics, finished a 10-day course on 11/17.   -Febrile 1/26 evening, hypotensive.  Chest x-ray 1/20-with left lower lung opacity, suspicious for pneumonia.  He is at high risk for aspiration, start Unasyn for 5 days, today's day #2 -Blood cultures are negative today, still pending.  WBC 10.4  Active problems  Hypertensive Urgency -patient required to be in ICU on a Cleviprex drip, currently on labetalol, hydralazine with improvement in  his blood pressure.  Blood pressure is stable today  ESRD on HD-management as per nephrology.  Hypophosphatemia, hypokalemia and hypomagnesemia, hyperkalemia-on dialysis   Poor p.o. intake -Has been refusing to eat and not really eating very much.  PEG tube hopefully this week   H/o CVA -On Aspirin and Clopidogrel, on dysphagia 1 diet but not eating very well   Anemia of Chronic Kidney Disease -Stable   Hypoalbuminemia -noted   Acute Metabolic Encephalopathy-Probably sec to influenza A infection, overall proving but still does not engage much on my evaluation   Abnormal LFTs -minimal, improving   Underweight/ Severe Malnutrition in the Context of Chronic Illness/cachexia -on tube feeds  Pressure Ulcers, not present on admission -WOC RN consulted. Continue silicone foam to the left ear as well as leave the left medial foot wounds open to the air as well as debridement agent and antimicrobial to sacrum daily and they are recommending the patient can be turned side to side and they are not can add an air mattress.  The wound care nurse also recommend Prevalon boots in place for offloading heels but recommended the staff needing to be cautious when applying straps and a foot placement when the areas have appeared.  Scheduled Meds:  aspirin  81 mg Per Tube Daily   Chlorhexidine Gluconate Cloth  6 each Topical Q0600   clopidogrel  75 mg Per Tube Daily   darbepoetin (ARANESP) injection - DIALYSIS  100 mcg Subcutaneous Q Thu-1800   diclofenac Sodium  4 g Topical Once   famotidine  20 mg Per Tube Daily   feeding supplement (PROSource TF20)  60 mL  Per Tube Daily   heparin  5,000 Units Subcutaneous Q8H   heparin sodium (porcine)       leptospermum manuka honey  1 Application Topical Daily   multivitamin  1 tablet Per Tube QHS   mouth rinse  15 mL Mouth Rinse 4 times per day   sodium zirconium cyclosilicate  10 g Per Tube BID   thiamine (VITAMIN B1) injection  100 mg Intravenous Daily    Continuous Infusions:  ampicillin-sulbactam (UNASYN) IV 1.5 g (06/28/22 0407)   feeding supplement (JEVITY 1.5 CAL/FIBER) 1,000 mL (06/28/22 0452)   PRN Meds:.acetaminophen, acetaminophen, albuterol, docusate sodium, guaiFENesin, heparin sodium (porcine), hydrALAZINE, hydrOXYzine, labetalol, mouth rinse, polyethylene glycol  Current Outpatient Medications  Medication Instructions   acetaminophen (TYLENOL) 650 mg, Oral, Every 6 hours PRN   albuterol (PROVENTIL) 2.5 mg, Nebulization, Every 6 hours PRN   amLODipine (NORVASC) 10 mg, Oral, Daily   aspirin EC 81 mg, Oral, Daily, Swallow whole.   atorvastatin (LIPITOR) 80 mg, Oral, Daily at bedtime   clopidogrel (PLAVIX) 75 mg, Oral, Daily   ergocalciferol (VITAMIN D2) 50,000 Units, Every Tue   feeding supplement (ENSURE ENLIVE / ENSURE PLUS) LIQD 237 mLs, Oral, 2 times daily between meals   hydrALAZINE (APRESOLINE) 50 mg, Oral, Every 8 hours   labetalol (NORMODYNE) 300 mg, Oral, 2 times daily   melatonin 3 mg, Oral, Daily at bedtime   MULTIPLE VITAMIN PO 1 tablet, Oral, Daily at bedtime   multivitamin (RENA-VIT) TABS tablet 1 tablet, Oral, Daily at bedtime   Nutritional Supplements (NEPRO PO) Oral, 2 times daily, AHR Nepro   pantoprazole (PROTONIX) 40 mg, Oral, Daily   sevelamer carbonate (RENVELA) 800 mg, Oral, 3 times daily with meals   UNABLE TO FIND 30 mLs, Oral, 2 times daily, Liquid protein     Diet Orders (From admission, onward)     Start     Ordered   06/24/22 1100  Diet clear liquid Room service appropriate? Yes; Fluid consistency: Thin  Diet effective now       Question Answer Comment  Room service appropriate? Yes   Fluid consistency: Thin      06/24/22 1100            DVT prophylaxis: heparin injection 5,000 Units Start: 06/08/22 2200 SCDs Start: 06/08/22 1543   Lab Results  Component Value Date   PLT 395 06/27/2022      Code Status: DNR  Family Communication: no family at bedside   Status is:  Inpatient  Remains inpatient appropriate because: severity of illness  Level of care: Progressive  Consultants:  Palliative PCCM  Objective: Vitals:   06/28/22 0355 06/28/22 0408 06/28/22 0448 06/28/22 0719  BP: 125/65   126/73  Pulse: (!) 101 (!) 105 (!) 103 (!) 104  Resp: 20 (!) 33 18 20  Temp: 99.4 F (37.4 C)   99.6 F (37.6 C)  TempSrc: Oral   Oral  SpO2: 99% 99% 100% 100%  Weight: 48.9 kg     Height:        Intake/Output Summary (Last 24 hours) at 06/28/2022 2956 Last data filed at 06/28/2022 0901 Gross per 24 hour  Intake 340 ml  Output 500 ml  Net -160 ml    Wt Readings from Last 3 Encounters:  06/28/22 48.9 kg  04/12/22 48.2 kg  01/01/22 52.3 kg    Examination:  Constitutional: NAD Eyes: lids and conjunctivae normal, no scleral icterus ENMT: mmm Neck: normal, supple Respiratory: clear to auscultation bilaterally, no  wheezing, no crackles. Normal respiratory effort.  Cardiovascular: Regular rate and rhythm, no murmurs / rubs / gallops. No LE edema. Abdomen: soft, no distention, no tenderness. Bowel sounds positive.  Skin: no rashes Neurologic: no focal deficits, equal strength   Data Reviewed: I have independently reviewed following labs and imaging studies   CBC Recent Labs  Lab 06/22/22 0024 06/23/22 0051 06/24/22 0026 06/25/22 0029 06/27/22 0802  WBC 5.7 6.0 6.9 8.6 10.4  HGB 8.2* 8.1* 7.4* 7.6* 7.1*  HCT 25.4* 26.0* 23.2* 23.3* 22.0*  PLT 235 283 284 329 395  MCV 86.7 87.5 86.6 86.3 85.6  MCH 28.0 27.3 27.6 28.1 27.6  MCHC 32.3 31.2 31.9 32.6 32.3  RDW 15.0 15.3 15.3 15.6* 15.5  LYMPHSABS 1.9 1.8  --   --   --   MONOABS 0.6 0.6  --   --   --   EOSABS 0.1 0.2  --   --   --   BASOSABS 0.0 0.0  --   --   --      Recent Labs  Lab 06/22/22 0024 06/23/22 0051 06/24/22 0026 06/25/22 0029 06/27/22 0802  NA 134* 135 133* 135 134*  K 3.8 3.8 4.3 5.4* 6.6*  CL 95* 96* 92* 94* 92*  CO2 27 30 28 29 28   GLUCOSE 97 107* 129* 122* 91   BUN 51* 35* 59* 84* 77*  CREATININE 4.75* 3.28* 4.68* 5.70* 5.03*  CALCIUM 9.3 9.0 9.1 8.9 8.6*  AST 37 46* 39  --  44*  ALT 27 25 24   --  24  ALKPHOS 71 75 67  --  83  BILITOT 0.3 0.1* 0.4  --  0.4  ALBUMIN 1.7* 1.7* 1.7*  --  1.6*  MG 1.9 1.7  --  2.4  --      ------------------------------------------------------------------------------------------------------------------ No results for input(s): "CHOL", "HDL", "LDLCALC", "TRIG", "CHOLHDL", "LDLDIRECT" in the last 72 hours.  No results found for: "HGBA1C" ------------------------------------------------------------------------------------------------------------------ No results for input(s): "TSH", "T4TOTAL", "T3FREE", "THYROIDAB" in the last 72 hours.  Invalid input(s): "FREET3"  Cardiac Enzymes No results for input(s): "CKMB", "TROPONINI", "MYOGLOBIN" in the last 168 hours.  Invalid input(s): "CK" ------------------------------------------------------------------------------------------------------------------    Component Value Date/Time   BNP >4,500.0 (H) 04/04/2022 0513    CBG: Recent Labs  Lab 06/27/22 1113 06/27/22 1605 06/27/22 2009 06/28/22 0351 06/28/22 0717  GLUCAP 98 102* 98 118* 108*     Recent Results (from the past 240 hour(s))  Culture, blood (Routine X 2) w Reflex to ID Panel     Status: None (Preliminary result)   Collection Time: 06/27/22  7:51 AM   Specimen: BLOOD  Result Value Ref Range Status   Specimen Description BLOOD RIGHT ANTECUBITAL  Final   Special Requests   Final    BOTTLES DRAWN AEROBIC AND ANAEROBIC Blood Culture results may not be optimal due to an inadequate volume of blood received in culture bottles   Culture   Final    NO GROWTH < 24 HOURS Performed at [facility] [address], [place], [place] [address]    Report Status PENDING  Incomplete  Culture, blood (Routine X 2) w Reflex to ID Panel     Status: None (Preliminary result)   Collection Time: 06/27/22   8:02 AM   Specimen: BLOOD RIGHT FOREARM  Result Value Ref Range Status   Specimen Description BLOOD RIGHT FOREARM  Final   Special Requests   Final    BOTTLES DRAWN AEROBIC ONLY Blood  Culture results may not be optimal due to an inadequate volume of blood received in culture bottles   Culture   Final    NO GROWTH < 24 HOURS Performed at [facility] [address] [place], [place] [address]    Report Status PENDING  Incomplete      Radiology Studies: No results found.   Marzetta Board, MD, PhD [facility]  Between 7 am - 7 pm I am available, please contact me via Amion (for emergencies) or Securechat (non urgent messages)  Between 7 pm - 7 am I am not available, please contact night coverage MD/APP via Amion

## 2022-06-28 NOTE — Progress Notes (Signed)
Pt with fever, Tylenol given, MD informed.

## 2022-06-28 NOTE — Progress Notes (Signed)
Cody Macdonald Progress Note   Subjective:   Patient seen and examined at bedside.  Tolerated dialysis well early this AM using TDC per nurses note.  Plan to use AVG with next HD.  Afebrile x 24hrs.   Objective Vitals:   06/28/22 0355 06/28/22 0408 06/28/22 0448 06/28/22 0719  BP: 125/65   126/73  Pulse: (!) 101 (!) 105 (!) 103 (!) 104  Resp: 20 (!) 33 18 20  Temp: 99.4 F (37.4 C)   99.6 F (37.6 C)  TempSrc: Oral   Oral  SpO2: 99% 99% 100% 100%  Weight: 48.9 kg     Height:       Physical Exam General:chronically ill appearing, cachetic, frail male in NAD Heart:RRR Lungs:CTAB anterolaterally  Abdomen:soft, NTND Extremities:no LE edema Dialysis Access: TDC LU AVG +b/t   Filed Weights   06/26/22 0406 06/27/22 0357 06/28/22 0355  Weight: 48.4 kg 50 kg 48.9 kg    Intake/Output Summary (Last 24 hours) at 06/28/2022 1050 Last data filed at 06/28/2022 3976 Gross per 24 hour  Intake 340 ml  Output 500 ml  Net -160 ml    Additional Objective Labs: Basic Metabolic Panel: Recent Labs  Lab 06/22/22 0024 06/23/22 0051 06/24/22 0026 06/25/22 0029 06/27/22 0802  NA 134* 135 133* 135 134*  K 3.8 3.8 4.3 5.4* 6.6*  CL 95* 96* 92* 94* 92*  CO2 27 30 28 29 28   GLUCOSE 97 107* 129* 122* 91  BUN 51* 35* 59* 84* 77*  CREATININE 4.75* 3.28* 4.68* 5.70* 5.03*  CALCIUM 9.3 9.0 9.1 8.9 8.6*  PHOS 3.5 2.4*  --   --   --    Liver Function Tests: Recent Labs  Lab 06/23/22 0051 06/24/22 0026 06/27/22 0802  AST 46* 39 44*  ALT 25 24 24   ALKPHOS 75 67 83  BILITOT 0.1* 0.4 0.4  PROT 6.2* 6.0* 6.2*  ALBUMIN 1.7* 1.7* 1.6*   CBC: Recent Labs  Lab 06/22/22 0024 06/23/22 0051 06/24/22 0026 06/25/22 0029 06/27/22 0802  WBC 5.7 6.0 6.9 8.6 10.4  NEUTROABS 3.1 3.3  --   --   --   HGB 8.2* 8.1* 7.4* 7.6* 7.1*  HCT 25.4* 26.0* 23.2* 23.3* 22.0*  MCV 86.7 87.5 86.6 86.3 85.6  PLT 235 283 284 329 395   Blood Culture    Component Value Date/Time   SDES  BLOOD RIGHT FOREARM 06/27/2022 0802   SPECREQUEST  06/27/2022 0802    BOTTLES DRAWN AEROBIC ONLY Blood Culture results may not be optimal due to an inadequate volume of blood received in culture bottles   CULT  06/27/2022 0802    NO GROWTH < 24 HOURS Performed at [facility], [address] [place], [place] [address]    REPTSTATUS PENDING 06/27/2022 0802    CBG: Recent Labs  Lab 06/27/22 1113 06/27/22 1605 06/27/22 2009 06/28/22 0351 06/28/22 0717  GLUCAP 98 102* 98 118* 108*    Studies/Results: DG CHEST PORT 1 VIEW  Result Date: 06/27/2022 CLINICAL DATA:  Fever. EXAM: PORTABLE CHEST 1 VIEW COMPARISON:  06/23/2022 and prior radiographs FINDINGS: Patient is rotated and leaning to the LEFT. A RIGHT IJ central venous catheter with tip overlying the LOWER SVC and small bore feeding tube entering the stomach with tip off the field of view again noted. Probable retrocardiac LEFT LOWER lung opacity noted suspicious for airspace disease/pneumonia. Mild bilateral interstitial opacities are again noted with probable trace RIGHT pleural effusion. There is no evidence of  pneumothorax or acute bony abnormality. The cardiomediastinal silhouette is unchanged given technique. IMPRESSION: Probable retrocardiac LEFT LOWER lung opacity suspicious for airspace disease/pneumonia. Electronically Signed   By: Margarette Canada M.D.   On: 06/27/2022 10:35    Medications:  ampicillin-sulbactam (UNASYN) IV 1.5 g (06/28/22 0407)   feeding supplement (JEVITY 1.5 CAL/FIBER) 1,000 mL (06/28/22 0452)    aspirin  81 mg Per Tube Daily   Chlorhexidine Gluconate Cloth  6 each Topical Q0600   clopidogrel  75 mg Per Tube Daily   darbepoetin (ARANESP) injection - DIALYSIS  100 mcg Subcutaneous Q Thu-1800   diclofenac Sodium  4 g Topical Once   famotidine  20 mg Per Tube Daily   feeding supplement (PROSource TF20)  60 mL Per Tube Daily   heparin  5,000 Units Subcutaneous Q8H   heparin sodium (porcine)        leptospermum manuka honey  1 Application Topical Daily   multivitamin  1 tablet Per Tube QHS   mouth rinse  15 mL Mouth Rinse 4 times per day   thiamine (VITAMIN B1) injection  100 mg Intravenous Daily    Dialysis Orders: MWF at [facility]  3.5h  48kg  2.2/5 bath  300/600  Hep none R AVG/TDC (AVG ok to use reportedly)    CXR 11/10- bilat splotchy perihilar disease, edema vs infxn, improved compared to 11/8 film   Assessment/ Plan: 1.  Resolved acute hypoxic respiratory failure: Now stable on RA. Due to combination of pulmonary edema/volume overload with bronchospasm along with influenza +/- and probable HCAP.  Serial CXR's showed resolution of infiltrates. CXR yesterday w/ Probable retrocardiac LLL opacity suspicious for airspace disease/pneumonia. ABX started. Per PMD.  2. Altered mental status: per staff at [facility] is not unusual for him not to respond verbally at the OP unit.  3.  ESRD: on HD MWF.  HD early AM d/t hyperkalemia on labs yesterday.  Plan for next HD on Wednesday 11/29 unless acute indication prior.  4. HD access: Has AVG with successful use at least twice.  Unfortunately TDC used for HD this AM.  If again no issues on 11/29 prob can arrrange TDC removal 5.  HTN/ vol: Blood pressure good.Weights labile likely sig under dry wt. BP's not tolerating further UF. Minimal UF with HD.  6.  Secondary hyperparathyroidism: Holding sevelamer as P < 3.0. Calcium in goal.   7.  Anemia of chronic disease: Hemoglobin dropping, last 7.1.  increase aranesp to 163mcg weekly.  TSAT 12% - iron course started, then held d/t concern for acute infection, will restart when off ABX.  8.  GOC - pt very frail, not interacting very much verbally. Appreciate palliative assistance. Poor prognosis.  Jen Mow, PA-C [facility] 06/28/2022,10:50 AM  LOS: 20 days

## 2022-06-29 ENCOUNTER — Inpatient Hospital Stay (HOSPITAL_COMMUNITY): Payer: Medicaid Other

## 2022-06-29 DIAGNOSIS — J189 Pneumonia, unspecified organism: Secondary | ICD-10-CM | POA: Diagnosis not present

## 2022-06-29 LAB — BASIC METABOLIC PANEL
Anion gap: 14 (ref 5–15)
BUN: 66 mg/dL — ABNORMAL HIGH (ref 6–20)
CO2: 26 mmol/L (ref 22–32)
Calcium: 8.5 mg/dL — ABNORMAL LOW (ref 8.9–10.3)
Chloride: 96 mmol/L — ABNORMAL LOW (ref 98–111)
Creatinine, Ser: 4.04 mg/dL — ABNORMAL HIGH (ref 0.61–1.24)
GFR, Estimated: 16 mL/min — ABNORMAL LOW (ref >60)
Glucose, Bld: 104 mg/dL — ABNORMAL HIGH (ref 70–99)
Potassium: 5.5 mmol/L — ABNORMAL HIGH (ref 3.5–5.1)
Sodium: 136 mmol/L (ref 135–145)

## 2022-06-29 LAB — GLUCOSE, CAPILLARY
Glucose-Capillary: 102 mg/dL — ABNORMAL HIGH (ref 70–99)
Glucose-Capillary: 114 mg/dL — ABNORMAL HIGH (ref 70–99)
Glucose-Capillary: 149 mg/dL — ABNORMAL HIGH (ref 70–99)
Glucose-Capillary: 87 mg/dL (ref 70–99)
Glucose-Capillary: 90 mg/dL (ref 70–99)
Glucose-Capillary: 98 mg/dL (ref 70–99)

## 2022-06-29 LAB — CBC
HCT: 22.6 % — ABNORMAL LOW (ref 39.0–52.0)
Hemoglobin: 7 g/dL — ABNORMAL LOW (ref 13.0–17.0)
MCH: 26.8 pg (ref 26.0–34.0)
MCHC: 31 g/dL (ref 30.0–36.0)
MCV: 86.6 fL (ref 80.0–100.0)
Platelets: 551 10*3/uL — ABNORMAL HIGH (ref 150–400)
RBC: 2.61 MIL/uL — ABNORMAL LOW (ref 4.22–5.81)
RDW: 15.8 % — ABNORMAL HIGH (ref 11.5–15.5)
WBC: 12.9 10*3/uL — ABNORMAL HIGH (ref 4.0–10.5)
nRBC: 0 % (ref 0.0–0.2)

## 2022-06-29 MED ORDER — IOHEXOL 350 MG/ML SOLN
75.0000 mL | Freq: Once | INTRAVENOUS | Status: AC | PRN
  Administered 2022-06-29: 75 mL via INTRAVENOUS

## 2022-06-29 MED ORDER — VANCOMYCIN HCL 500 MG/100ML IV SOLN
500.0000 mg | INTRAVENOUS | Status: DC
  Administered 2022-06-30 – 2022-07-03 (×2): 500 mg via INTRAVENOUS
  Filled 2022-06-29 (×4): qty 100

## 2022-06-29 MED ORDER — CHLORHEXIDINE GLUCONATE CLOTH 2 % EX PADS
6.0000 | MEDICATED_PAD | Freq: Every day | CUTANEOUS | Status: DC
  Administered 2022-06-29 – 2022-07-01 (×3): 6 via TOPICAL

## 2022-06-29 MED ORDER — JUVEN PO PACK
1.0000 | PACK | Freq: Two times a day (BID) | ORAL | Status: DC
  Administered 2022-07-01 – 2022-07-08 (×13): 1
  Filled 2022-06-29 (×15): qty 1

## 2022-06-29 MED ORDER — SODIUM CHLORIDE 0.9 % IV SOLN
1.0000 g | INTRAVENOUS | Status: AC
  Administered 2022-06-30 – 2022-07-08 (×8): 1 g via INTRAVENOUS
  Filled 2022-06-29 (×9): qty 10

## 2022-06-29 MED ORDER — SODIUM CHLORIDE 0.9 % IV SOLN
2.0000 g | Freq: Once | INTRAVENOUS | Status: AC
  Administered 2022-06-29: 2 g via INTRAVENOUS
  Filled 2022-06-29: qty 12.5

## 2022-06-29 MED ORDER — JUVEN PO PACK: 1.0000 | PACK | Freq: Two times a day (BID) | ORAL | Status: DC

## 2022-06-29 MED ORDER — NEPRO/CARBSTEADY PO LIQD: 1000.0000 mL | ORAL | Status: DC

## 2022-06-29 MED ORDER — DAKINS (1/4 STRENGTH) 0.125 % EX SOLN
Freq: Two times a day (BID) | CUTANEOUS | Status: AC
  Administered 2022-07-01 – 2022-07-02 (×2): 1 via TOPICAL
  Filled 2022-06-29 (×3): qty 473

## 2022-06-29 MED ORDER — SODIUM ZIRCONIUM CYCLOSILICATE 10 G PO PACK
10.0000 g | PACK | Freq: Once | ORAL | Status: AC
  Administered 2022-06-29: 10 g via ORAL
  Filled 2022-06-29: qty 1

## 2022-06-29 MED ORDER — NEPRO/CARBSTEADY PO LIQD
1000.0000 mL | ORAL | Status: DC
  Administered 2022-06-29 – 2022-07-03 (×4): 1000 mL
  Filled 2022-06-29 (×5): qty 1000

## 2022-06-29 MED ORDER — VANCOMYCIN HCL IN DEXTROSE 1-5 GM/200ML-% IV SOLN
1000.0000 mg | Freq: Once | INTRAVENOUS | Status: AC
  Administered 2022-06-29: 1000 mg via INTRAVENOUS
  Filled 2022-06-29: qty 200

## 2022-06-29 NOTE — Progress Notes (Signed)
Nutrition Follow-up  DOCUMENTATION CODES:   Underweight, Severe malnutrition in context of chronic illness  INTERVENTION:  Given ongoing increases in potassium, TF changed to Nepro at 38m/hr (960 ml)   60 ml ProSource TF20 once daily, each supplement provides 80 kcals and 20 grams protein.   Tube feeding regimen provides 1779 kcal, 98g protein, 697m  1 packet Juven BID per tube, each packet provides 95 calories, 2.5 grams of protein (collagen), and 9.8 grams of carbohydrate (3 grams sugar); also contains 7 grams of L-arginine and L-glutamine, 300 mg vitamin C, 15 mg vitamin E, 1.2 mcg vitamin B-12, 9.5 mg zinc, 200 mg calcium, and 1.5 g  Calcium Beta-hydroxy-Beta-methylbutyrate to support wound healing  NUTRITION DIAGNOSIS:   Severe Malnutrition related to chronic illness (ESRD on HD) as evidenced by severe muscle depletion, severe fat depletion.  Ongoing  GOAL:   Patient will meet greater than or equal to 90% of their needs  Goal met via tube feeding  MONITOR:   Diet advancement, Weight trends, Supplement acceptance, Labs  REASON FOR ASSESSMENT:   Consult Assessment of nutrition requirement/status  ASSESSMENT:   Pt with hx of ESRD on HD, hx CVA, HTN, and hx of EtOH and tobacco abuse presented to ED from SNF with SOB, hyperkalemia, and in HTN crisis.  11/17 cortrak placed 11/20 diet downgraded to NPO by ST d/t oral holding and pocketing 11/23 ST assessed, cleared for liquids as mentation allows  Pt now febrile 2/2 possible PNA.   Despite lokelma being given for high potassium, his potassium continues to trend up. TF regimen adjusted back to Nepro.  Next HD planned for tomorrow.   IR assessed pt today. Plans for PEG placement 12/1.   Spoke with RN. Pt not consuming any PO. Ordered Juven per tube to support wound healing.   Medications: pepcid, medihoney, rena-vit, thiamine 10062maily IV drips: abx  Labs: potassium 5.5, BUN 66, Cr 4.04, GFR 16, CBG's 87-149  x24 hours  I/O's: +9.5L since 11/14  Diet Order:   Diet Order             Diet NPO time specified Except for: Sips with Meds  Diet effective midnight           Diet clear liquid Room service appropriate? Yes; Fluid consistency: Thin  Diet effective now                   EDUCATION NEEDS:   Not appropriate for education at this time  Skin:  Skin Assessment: Skin Integrity Issues: Skin Integrity Issues:: Unstageable DTI: L lateral foot Stage II: per WOC: R sacrum measures 3.2 cm x 2 cm and is 100% pink Unstageable: per WOC:  Left buttock close to the anus (9 cm x 6 cm); Left ischium (9 cm x 5 cm), coccyx area (10 cm x 8 cm), and a smaller one above this that is 1 cm x 0.4 cm  Last BM:  11/26  Height:   Ht Readings from Last 1 Encounters:  06/09/22 _0  (1.727 m)    Weight:   Wt Readings from Last 1 Encounters:  06/29/22 47.9 kg    Ideal Body Weight:  70 kg  BMI:  Body mass index is 16.06 kg/m.  Estimated Nutritional Needs:   Kcal:  1700-1900 kcal/d  Protein:  80-100 g/d  Fluid:  1L+UOP  Cody Macdonald, LDN Clinical Nutrition

## 2022-06-29 NOTE — Progress Notes (Addendum)
Pharmacy Antibiotic Note  LUX MEADERS is a 61 y.o. male admitted on 06/08/2022, now with concern for infection given spiking temp overnight despite Unasyn, WBC up this am as well.  Pharmacy has been consulted for vancomycin and cefepime dosing.  Plan: Vancomycin 1000mg  x1 now then 500mg  IV every HD.  Goal pre-HD level 15-20 mcg/mL. Cefepime 2g x1 now then 1g IV Q24H.  Height: 5\' 8"  (172.7 cm) Weight: 47.9 kg (105 lb 9.6 oz) IBW/kg (Calculated) : 68.4  Temp (24hrs), Avg:99.6 F (37.6 C), Min:98.2 F (36.8 C), Max:102.1 F (38.9 C)  Recent Labs  Lab 06/23/22 0051 06/24/22 0026 06/25/22 0029 06/27/22 0802 06/29/22 0049  WBC 6.0 6.9 8.6 10.4 12.9*  CREATININE 3.28* 4.68* 5.70* 5.03* 4.04*    Estimated Creatinine Clearance: 13.2 mL/min (A) (by C-G formula based on SCr of 4.04 mg/dL (H)).    No Known Allergies   Thank you for allowing pharmacy to be a part of this patient's care.  Wynona Neat, PharmD, BCPS  06/29/2022 5:03 AM

## 2022-06-29 NOTE — Consult Note (Addendum)
[place] Nurse wound follow up Patient receiving care in [place]. Assisted with turning by primary nurse. Today I saw the patient for a re-evaluation of wounds to the sacrum, left foot, left heel that were found on 11/20.  This is what I observed: The patient has large, foul smelling unstageable wounds that are yellow/brown/slight rings of pink on the Left buttock close to the anus (9 cm x 6 cm); Left ischium (9 cm x 5 cm), coccyx area (10 cm x 8 cm), and a smaller one above this that is 1 cm x 0.4 cm).  For these I have ordered a 7 day course of bid 1/4% Dakin's solution.  The right sacrum is equivalent to a stage 2 PI and measures 3.2 cm x 2 cm and is 100% pink. There is an abrasion to the right ischial area, approximately quarter size, and 100% pink. These need foam dressings.  The entire left lateral foot is a darkened, dry DTPI.  The patient has a newly formed evolving DTPI to the Left lateral lower leg that has some fluid under the skin.  For this: Apply iodine from the swabsticks or swab pads from clean utility to the entire left lateral foot and left lateral lower leg.  Allow to dry and place the foot back into the Prevalon boot.   The right iliac crest area has a DTPI that measure 3 cm x 4 cm.  For this:Apply iodine from the swabsticks or swab pads from clean utility to right iliac crest area.  Allow to air dry, cover with a foam dressing.  I have also asked the [place] to order a standard size bed with air mattress.  Considering the rapidity with which this patient appears to have declined, it might be helpful to have discussions with the family about comfort measures.  In my opinion, these wound will not heal, but will continue to worsen.  Monitor the wound area(s) for worsening of condition such as: Signs/symptoms of infection,  Increase in size,  Development of or worsening of odor, Development of pain, or increased pain at the affected locations.  Notify the medical team if any of these  develop.  Val Riles, RN, MSN, CWOCN, CNS-BC, pager [phone]

## 2022-06-29 NOTE — Consult Note (Signed)
Chief Complaint: Patient was seen in consultation today for percutaneous gastric tube placement Chief Complaint  Patient presents with   Shortness of Breath   at the request of Dr Ardeth Sportsman   Supervising Physician: Ruthann Cancer  Patient Status: [facility] - In-pt  History of Present Illness: Cody Macdonald is a 61 y.o. male   CVA HTN ESRD Lives in SNF Encephalopathic Failed swallow study-- Dysphagia Deconditioning Malnutrition/poor po intake Need for long term care  LD Plavix 11/27 Scheduled for percutaneous gastric tube placement in IR 12/1  Past Medical History:  Diagnosis Date   Chronic kidney disease    Hypertension    Stroke [facility])     Past Surgical History:  Procedure Laterality Date   IR FLUORO GUIDE CV LINE RIGHT  12/25/2021   IR US GUIDE VASC ACCESS RIGHT  12/25/2021    Allergies: Patient has no known allergies.  Medications: Prior to Admission medications   Medication Sig Start Date End Date Taking? Authorizing Provider  acetaminophen (TYLENOL) 325 MG tablet Take 650 mg by mouth every 6 (six) hours as needed for mild pain.   Yes [provider]  albuterol (PROVENTIL) (2.5 MG/3ML) 0.083% nebulizer solution Take 3 mLs (2.5 mg total) by nebulization every 6 (six) hours as needed for wheezing or shortness of breath. 01/01/22  Yes Ghimire, Henreitta Leber, MD  amLODipine (NORVASC) 10 MG tablet Take 1 tablet (10 mg total) by mouth daily. 01/01/22  Yes Ghimire, Henreitta Leber, MD  aspirin EC 81 MG tablet Take 81 mg by mouth daily. Swallow whole.   Yes [provider]  atorvastatin (LIPITOR) 80 MG tablet Take 80 mg by mouth at bedtime. 12/22/21  Yes [provider]  clopidogrel (PLAVIX) 75 MG tablet Take 75 mg by mouth daily. 12/20/21  Yes [provider]  hydrALAZINE (APRESOLINE) 50 MG tablet Take 1 tablet (50 mg total) by mouth every 8 (eight) hours. Patient taking differently: Take 50 mg by mouth 3 (three) times daily. 01/01/22  Yes Ghimire,  Henreitta Leber, MD  labetalol (NORMODYNE) 300 MG tablet Take 1 tablet (300 mg total) by mouth 2 (two) times daily. 01/01/22  Yes Ghimire, Henreitta Leber, MD  melatonin 3 MG TABS tablet Take 3 mg by mouth at bedtime.   Yes [provider]  MULTIPLE VITAMIN PO Take 1 tablet by mouth at bedtime.   Yes [provider]  multivitamin (RENA-VIT) TABS tablet Take 1 tablet by mouth at bedtime. 01/01/22  Yes Ghimire, Henreitta Leber, MD  Nutritional Supplements (NEPRO PO) Take by mouth in the morning and at bedtime. AHR Nepro   Yes [provider]  pantoprazole (PROTONIX) 40 MG tablet Take 40 mg by mouth daily. 12/20/21  Yes [provider]  sevelamer carbonate (RENVELA) 800 MG tablet Take 1 tablet (800 mg total) by mouth 3 (three) times daily with meals. Patient taking differently: Take 1,600 mg by mouth 3 (three) times daily with meals. 01/01/22  Yes Ghimire, Henreitta Leber, MD  UNABLE TO FIND Take 30 mLs by mouth in the morning and at bedtime. Liquid protein   Yes [provider]  ergocalciferol (VITAMIN D2) 1.25 MG (50000 UT) capsule Take 50,000 Units by mouth every Tuesday. Patient not taking: Reported on 06/08/2022    [provider]  feeding supplement (ENSURE ENLIVE / ENSURE PLUS) LIQD Take 237 mLs by mouth 2 (two) times daily between meals. Patient not taking: Reported on 04/04/2022 01/01/22   Jonetta Osgood, MD  History reviewed. No pertinent family history.  Social History   Socioeconomic History   Marital status: Single    Spouse name: Not on file   Number of children: Not on file   Years of education: Not on file   Highest education level: Not on file  Occupational History   Not on file  Tobacco Use   Smoking status: Every Day    Packs/day: 0.25    Types: Cigarettes   Smokeless tobacco: Never  Substance and Sexual Activity   Alcohol use: Yes    Alcohol/week: 1.0 standard drink of alcohol    Types: 1 Cans of beer per week   Drug use: Yes    Types:  Marijuana   Sexual activity: Not on file  Other Topics Concern   Not on file  Social History Narrative   Not on file   Social Determinants of Health   Financial Resource Strain: Not on file  Food Insecurity: Not on file  Transportation Needs: Not on file  Physical Activity: Not on file  Stress: Not on file  Social Connections: Not on file    Review of Systems: A 12 point ROS discussed and pertinent positives are indicated in the HPI above.  All other systems are negative.  Vital Signs: BP 130/72   Pulse 99   Temp (!) 97.5 F (36.4 C) (Oral)   Resp 17   Ht 5\' 8"  (1.727 m)   Wt 105 lb 9.6 oz (47.9 kg)   SpO2 100%   BMI 16.06 kg/m     Physical Exam Vitals reviewed.  Cardiovascular:     Rate and Rhythm: Normal rate and regular rhythm.  Pulmonary:     Effort: Pulmonary effort is normal.     Breath sounds: No wheezing.  Abdominal:     Palpations: Abdomen is soft.  Musculoskeletal:     Comments: Follows few commands   Skin:    General: Skin is warm.  Neurological:     Comments: Spoke to son Cody Macdonald via phone He consents for procedure     Imaging: CT CHEST ABDOMEN PELVIS W CONTRAST  Result Date: 06/29/2022 CLINICAL DATA:  Sepsis. EXAM: CT CHEST, ABDOMEN, AND PELVIS WITH CONTRAST TECHNIQUE: Multidetector CT imaging of the chest, abdomen and pelvis was performed following the standard protocol during bolus administration of intravenous contrast. RADIATION DOSE REDUCTION: This exam was performed according to the departmental dose-optimization program which includes automated exposure control, adjustment of the mA and/or kV according to patient size and/or use of iterative reconstruction technique. CONTRAST:  40mL OMNIPAQUE IOHEXOL 350 MG/ML SOLN COMPARISON:  May 26, 2022. FINDINGS: CT CHEST FINDINGS Cardiovascular: No significant vascular findings. Normal heart size. No pericardial effusion. Mediastinum/Nodes: No enlarged mediastinal, hilar, or axillary lymph nodes.  Thyroid gland, trachea, and esophagus demonstrate no significant findings. Lungs/Pleura: No pneumothorax or pleural effusion is noted. Left lower lobe airspace opacity is noted concerning for infiltrate or atelectasis. 4 mm nodule is noted in right lung apex best seen on image number 41 of series 4. Minimal right basilar subsegmental atelectasis is noted. Musculoskeletal: No chest wall mass or suspicious bone lesions identified. CT ABDOMEN PELVIS FINDINGS Hepatobiliary: No focal liver abnormality is seen. No gallstones, gallbladder wall thickening, or biliary dilatation. Pancreas: Unremarkable. No pancreatic ductal dilatation or surrounding inflammatory changes. Spleen: Normal in size without focal abnormality. Adrenals/Urinary Tract: Adrenal glands are unremarkable. Kidneys are normal, without renal calculi, focal lesion, or hydronephrosis. Bladder is unremarkable. Stomach/Bowel: Distal tip of feeding tube is seen in  distal stomach. The appendix is unremarkable. Residual stool and contrast is noted throughout the nondilated colon. There is no evidence of bowel obstruction. Vascular/Lymphatic: Aortic atherosclerosis. No enlarged abdominal or pelvic lymph nodes. 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 Reproductive: Prostate gland not well visualized. Other: No abdominal wall hernia or abnormality. No abdominopelvic ascites. Musculoskeletal: Stable lucent lesions are noted in the right iliac bones which may be benign, but metastatic disease cannot be excluded. IMPRESSION: Mild left lower lobe airspace opacity is noted concerning for pneumonia or atelectasis. Minimal right basilar subsegmental atelectasis is noted. 4 mm nodule is noted in right lung apex. No follow-up needed if patient is low-risk.This recommendation follows the consensus statement: Guidelines for Management of Incidental Pulmonary Nodules Detected on CT Images: From the Fleischner Society 2017;  Radiology 2017; 284:228-243. Stable lucent lesions are noted in the right iliac bones which may be benign, but metastatic disease cannot be excluded. If there is clinical concern for metastatic disease, bone scan can be performed further evaluation. No other significant abnormality seen in the abdomen or pelvis. Aortic Atherosclerosis (ICD10-I70.0). Electronically Signed   By: Marijo Conception M.D.   On: 06/29/2022 13:40   DG CHEST PORT 1 VIEW  Result Date: 06/27/2022 CLINICAL DATA:  Fever. EXAM: PORTABLE CHEST 1 VIEW COMPARISON:  06/23/2022 and prior radiographs FINDINGS: Patient is rotated and leaning to the LEFT. A RIGHT IJ central venous catheter with tip overlying the LOWER SVC and small bore feeding tube entering the stomach with tip off the field of view again noted. Probable retrocardiac LEFT LOWER lung opacity noted suspicious for airspace disease/pneumonia. Mild bilateral interstitial opacities are again noted with probable trace RIGHT pleural effusion. There is no evidence of pneumothorax or acute bony abnormality. The cardiomediastinal silhouette is unchanged given technique. IMPRESSION: Probable retrocardiac LEFT LOWER lung opacity suspicious for airspace disease/pneumonia. Electronically Signed   By: Margarette Canada M.D.   On: 06/27/2022 10:35   DG CHEST PORT 1 VIEW  Result Date: 06/23/2022 CLINICAL DATA:  Shortness of breath EXAM: PORTABLE CHEST 1 VIEW COMPARISON:  Radiograph 06/22/2022 FINDINGS: Right neck catheter tip overlies the mid SVC. Unchanged cardiomediastinal silhouette. Feeding tube passes below the diaphragm, tip excluded by collimation. Mild perihilar predominant interstitial opacities, unchanged from prior. No new airspace disease. No pleural effusion or pneumothorax. Bones are unchanged. IMPRESSION: Mild perihilar predominant interstitial opacities, unchanged from prior, could be pulmonary edema or atypical infection. No new airspace disease. Electronically Signed   By: Maurine Simmering  M.D.   On: 06/23/2022 08:36   DG CHEST PORT 1 VIEW  Result Date: 06/22/2022 CLINICAL DATA:  Shortness of breath. EXAM: PORTABLE CHEST 1 VIEW COMPARISON:  Chest radiograph dated 06/21/2022. FINDINGS: With sided Port-A-Cath with tip over central SVC. Feeding tube extends below the diaphragm with tip beyond the inferior margin of the image. Mild diffuse interstitial prominence and perihilar streaky densities may represent edema. Developing infiltrate is not excluded clinical correlation recommended. No consolidative changes with there is no pleural effusion pneumothorax. Stable cardiac silhouette. No acute osseous pathology. IMPRESSION: Mild diffuse interstitial prominence and perihilar streaky densities may represent edema or developing infiltrate. Electronically Signed   By: Anner Crete M.D.   On: 06/22/2022 19:31   DG CHEST PORT 1 VIEW  Result Date: 06/21/2022 CLINICAL DATA:  Shortness of breath. In stage kidney disease. Hypertension. Stroke EXAM: PORTABLE CHEST 1 VIEW COMPARISON:  06/20/2022 FINDINGS: Right IJ catheter tip is in the projection  of the distal SVC. There is a feeding tube with tip below the level of the hemidiaphragms. Stable cardiomediastinal contours. Lungs appear hyperinflated. IMPRESSION: No active disease. Electronically Signed   By: Kerby Moors M.D.   On: 06/21/2022 05:47   DG CHEST PORT 1 VIEW  Result Date: 06/20/2022 CLINICAL DATA:  Short of breath EXAM: PORTABLE CHEST 1 VIEW COMPARISON:  Prior chest x-ray 06/18/2022 FINDINGS: Right IJ tunneled hemodialysis catheter. Tip overlies the mid SVC. Stable cardiac and mediastinal contours. Slightly increased interstitial airspace opacities in the perihilar distribution bilaterally consistent with edema or fluid overload. No pneumothorax. No focal airspace infiltrate. Partially imaged feeding tube. The tip lies below the diaphragm, likely within the stomach or small bowel. IMPRESSION: 1. Perihilar interstitial airspace opacities  consistent with pulmonary edema versus volume overload. 2. Well-positioned right IJ tunneled hemodialysis catheter. Electronically Signed   By: Jacqulynn Cadet M.D.   On: 06/20/2022 07:43   DG Abd Portable 1V  Result Date: 06/18/2022 CLINICAL DATA:  NG tube placement EXAM: PORTABLE ABDOMEN - 1 VIEW COMPARISON:  KUB 12/24/2021, CT abdomen/pelvis 05/26/2022 FINDINGS: The enteric catheter tip is in the distal stomach. There is enteric contrast in the colon. There is a nonobstructive bowel gas pattern. There is no definite free intraperitoneal air. There is no acute osseous abnormality. IMPRESSION: 1. Enteric catheter tip in the stomach. 2. Enteric contrast in the colon.  Nonobstructive bowel gas pattern. Electronically Signed   By: Valetta Mole M.D.   On: 06/18/2022 15:58   DG CHEST PORT 1 VIEW  Result Date: 06/18/2022 CLINICAL DATA:  [id number] SOB (shortness of breath) [id number] EXAM: PORTABLE CHEST 1 VIEW COMPARISON:  06/16/2022 chest radiograph. FINDINGS: Right internal jugular central venous catheter terminates in the middle third of the SVC. Surgical clips overlie the medial lower left chest. Stable cardiomediastinal silhouette with normal heart size. No pneumothorax. No pleural effusion. Slight prominence of the parahilar interstitial markings, slightly improved. IMPRESSION: Slight prominence of the parahilar interstitial markings, slightly improved, favor resolving mild pulmonary edema. Electronically Signed   By: Ilona Sorrel M.D.   On: 06/18/2022 08:19   DG CHEST PORT 1 VIEW  Result Date: 06/16/2022 CLINICAL DATA:  Dyspnea, cough, and congestion. End-stage renal disease. EXAM: PORTABLE CHEST 1 VIEW COMPARISON:  Chest radiograph 06/11/2022 FINDINGS: A right jugular dialysis catheter terminates over the lower SVC, unchanged. The cardiac silhouette is normal in size. Bilateral perihilar airspace opacities demonstrate further mild improvement. No sizable pleural effusion or pneumothorax is identified.  No acute osseous abnormality is seen. IMPRESSION: Further mild improvement of bilateral perihilar opacities which may reflect edema or infection. Electronically Signed   By: Logan Bores M.D.   On: 06/16/2022 08:23   DG CHEST PORT 1 VIEW  Result Date: 06/11/2022 CLINICAL DATA:  Respiratory distress, dyspnea, prior are normal chest x-ray EXAM: PORTABLE CHEST 1 VIEW COMPARISON:  06/09/2022 FINDINGS: Single frontal view of the chest demonstrates stable right internal jugular dialysis catheter. Stable cardiac silhouette. Bilateral perihilar airspace disease again identified, slightly improved since prior exam. No effusion or pneumothorax. No acute bony abnormalities. IMPRESSION: 1. Persistent but improving bilateral perihilar airspace disease, which may reflect resolving infection or edema. Electronically Signed   By: Randa Ngo M.D.   On: 06/11/2022 21:49   DG Chest Port 1 View  Result Date: 06/09/2022 CLINICAL DATA:  61 year old male with respiratory failure, sepsis. End stage renal disease. EXAM: PORTABLE CHEST 1 VIEW COMPARISON:  Portable chest 06/08/2022 and earlier. FINDINGS: Portable AP semi upright view  at 0417 hours. Stable right chest dual lumen dialysis type catheter. Larger lung volumes. But increasing confluence of perihilar and widespread right greater than left lung opacity. Multilobar involvement suspected bilaterally, and early consolidation suspected now. No superimposed pneumothorax. Small right pleural effusion appears stable. Pulmonary vascularity in the affected areas appears relatively normal. Stable mild cardiomegaly and other mediastinal contours. No acute osseous abnormality identified. Paucity of bowel gas in the upper abdomen. IMPRESSION: 1. Progressed bilateral multilobar airspace opacity now with early consolidation suspected. Differential considerations include asymmetric pulmonary edema, bilateral pneumonia, developing ARDS. 2. Stable mild cardiomegaly and small right pleural  effusion. Electronically Signed   By: Genevie Ann M.D.   On: 06/09/2022 05:03   DG Chest Port 1 View  Result Date: 06/08/2022 CLINICAL DATA:  Sepsis.  Shortness of breath. EXAM: PORTABLE CHEST 1 VIEW COMPARISON:  05/26/2022 FINDINGS: There is a right chest wall dialysis catheter with tips at the superior cavoatrial junction. Stable cardiomediastinal contours. There is a small right pleural effusion. Mild diffuse interstitial edema identified. Bilateral airspace opacities are identified which may represent areas of superimposed pneumonia or asymmetric pulmonary edema. Visualized osseous structures are unremarkable. IMPRESSION: 1. Small right pleural effusion and diffuse interstitial edema. 2. Bilateral airspace opacities compatible with superimposed pneumonia or asymmetric pulmonary edema. Electronically Signed   By: Kerby Moors M.D.   On: 06/08/2022 12:20    Labs:  CBC: Recent Labs    06/24/22 0026 06/25/22 0029 06/27/22 0802 06/29/22 0049  WBC 6.9 8.6 10.4 12.9*  HGB 7.4* 7.6* 7.1* 7.0*  HCT 23.2* 23.3* 22.0* 22.6*  PLT 284 329 395 551*    COAGS: Recent Labs    12/24/21 1846 06/08/22 1132  INR 1.1 1.3*  APTT  --  20*    BMP: Recent Labs    06/24/22 0026 06/25/22 0029 06/27/22 0802 06/29/22 0049  NA 133* 135 134* 136  K 4.3 5.4* 6.6* 5.5*  CL 92* 94* 92* 96*  CO2 28 29 28 26   GLUCOSE 129* 122* 91 104*  BUN 59* 84* 77* 66*  CALCIUM 9.1 8.9 8.6* 8.5*  CREATININE 4.68* 5.70* 5.03* 4.04*  GFRNONAA 14* 11* 12* 16*    LIVER FUNCTION TESTS: Recent Labs    06/22/22 0024 06/23/22 0051 06/24/22 0026 06/27/22 0802  BILITOT 0.3 0.1* 0.4 0.4  AST 37 46* 39 44*  ALT 27 25 24 24   ALKPHOS 71 75 67 83  PROT 5.9* 6.2* 6.0* 6.2*  ALBUMIN 1.7* 1.7* 1.7* 1.6*    TUMOR MARKERS: No results for input(s): "AFPTM", "CEA", "CA199", "CHROMGRNA" in the last 8760 hours.  Assessment and Plan:  Dysphagia-- CVA Poor intake Malnutrition Deconditioning Need for long term  care Scheduled for percutaneous gastric tube placment in IR 12/1 LD Plavix 11/27  Risks and benefits image guided gastrostomy tube placement was discussed with the patient's son Cody Macdonald via phone including, but not limited to the need for a barium enema during the procedure, bleeding, infection, peritonitis and/or damage to adjacent structures.  All questions were answered, Cody Macdonald is agreeable to proceed.  Consent signed and in chart.  Thank you for this interesting consult.  I greatly enjoyed meeting Cody Macdonald and look forward to participating in their care.  A copy of this report was sent to the requesting provider on this date.  Electronically Signed: Lavonia Drafts, PA-C 06/29/2022, 2:19 PM   I spent a total of 20 Minutes    in face to face in clinical consultation, greater than 50%  of which was counseling/coordinating care for percutaneous gastric tube placement

## 2022-06-29 NOTE — Progress Notes (Signed)
Cody Macdonald Progress Note   Subjective:   Patient seen and examined at bedside.  When asked how he is doing he states "not so good".  Did not voice any specific complaints.  T max 102.1 overnight. Winces in pain when I try to move his L arm.  Objective Vitals:   06/28/22 2100 06/29/22 0010 06/29/22 0418 06/29/22 0717  BP: 137/69 (!) 147/87 137/78 125/66  Pulse:    98  Resp:    17  Temp: 98.4 F (36.9 C) 98.2 F (36.8 C) (!) 102.1 F (38.9 C) 98.6 F (37 C)  TempSrc: Oral Oral Axillary Oral  SpO2: 100%  99% 98%  Weight:  47.9 kg    Height:       Physical Exam General:chronically ill, cachetic, frail male in NAD Heart:RRR Lungs:CTAB anterolaterally  Abdomen:soft, NTND Extremities:no LE edema Dialysis Access: LU AVG +b/t   Filed Weights   06/27/22 0357 06/28/22 0355 06/29/22 0010  Weight: 50 kg 48.9 kg 47.9 kg    Intake/Output Summary (Last 24 hours) at 06/29/2022 1023 Last data filed at 06/29/2022 1007 Gross per 24 hour  Intake 5475 ml  Output 0 ml  Net 5475 ml    Additional Objective Labs: Basic Metabolic Panel: Recent Labs  Lab 06/23/22 0051 06/24/22 0026 06/25/22 0029 06/27/22 0802 06/29/22 0049  NA 135   < > 135 134* 136  K 3.8   < > 5.4* 6.6* 5.5*  CL 96*   < > 94* 92* 96*  CO2 30   < > 29 28 26   GLUCOSE 107*   < > 122* 91 104*  BUN 35*   < > 84* 77* 66*  CREATININE 3.28*   < > 5.70* 5.03* 4.04*  CALCIUM 9.0   < > 8.9 8.6* 8.5*  PHOS 2.4*  --   --   --   --    < > = values in this interval not displayed.   Liver Function Tests: Recent Labs  Lab 06/23/22 0051 06/24/22 0026 06/27/22 0802  AST 46* 39 44*  ALT 25 24 24   ALKPHOS 75 67 83  BILITOT 0.1* 0.4 0.4  PROT 6.2* 6.0* 6.2*  ALBUMIN 1.7* 1.7* 1.6*   CBC: Recent Labs  Lab 06/23/22 0051 06/24/22 0026 06/25/22 0029 06/27/22 0802 06/29/22 0049  WBC 6.0 6.9 8.6 10.4 12.9*  NEUTROABS 3.3  --   --   --   --   HGB 8.1* 7.4* 7.6* 7.1* 7.0*  HCT 26.0* 23.2* 23.3* 22.0*  22.6*  MCV 87.5 86.6 86.3 85.6 86.6  PLT 283 284 329 395 551*   Blood Culture    Component Value Date/Time   SDES BLOOD RIGHT FOREARM 06/27/2022 0802   SPECREQUEST  06/27/2022 0802    BOTTLES DRAWN AEROBIC ONLY Blood Culture results may not be optimal due to an inadequate volume of blood received in culture bottles   CULT  06/27/2022 0802    NO GROWTH 2 DAYS Performed at [facility], [place] [address], [place], [place] [address]    REPTSTATUS PENDING 06/27/2022 0802    CBG: Recent Labs  Lab 06/28/22 1602 06/28/22 2026 06/29/22 0017 06/29/22 0428 06/29/22 0718  GLUCAP 115* 118* 87 114* 149*    Medications:  [START ON 07/02/2022]  ceFAZolin (ANCEF) IV     [START ON 06/30/2022] ceFEPime (MAXIPIME) IV     feeding supplement (JEVITY 1.5 CAL/FIBER) 1,000 mL (06/29/22 0611)   [START ON 06/30/2022] vancomycin  aspirin  81 mg Per Tube Daily   Chlorhexidine Gluconate Cloth  6 each Topical Q0600   [START ON 07/01/2022] darbepoetin (ARANESP) injection - DIALYSIS  150 mcg Subcutaneous Q Thu-1800   diclofenac Sodium  4 g Topical Once   famotidine  20 mg Per Tube Daily   feeding supplement (PROSource TF20)  60 mL Per Tube Daily   heparin  5,000 Units Subcutaneous Q8H   leptospermum manuka honey  1 Application Topical Daily   multivitamin  1 tablet Per Tube QHS   mouth rinse  15 mL Mouth Rinse 4 times per day   thiamine (VITAMIN B1) injection  100 mg Intravenous Daily    Dialysis Orders: MWF at [facility] [address]  3.5h  48kg  2.2/5 bath  300/600  Hep none R AVG/TDC (AVG ok to use reportedly)    CXR 11/10- bilat splotchy perihilar disease, edema vs infxn, improved compared to 11/8 film   Assessment/ Plan: 1.  Resolved acute hypoxic respiratory failure: Now stable on RA. Due to combination of pulmonary edema/volume overload with bronchospasm along with influenza +/- and probable HCAP.  Previously improving, CXR 11/27 w/ Probable retrocardiac LLL opacity suspicious for  airspace disease/pneumonia. ABX started. Per PMD.  2. Fever/leukocytosis - Fever 102 overnight w/increase WBC today despite ABX. BC w/NGTD.  CXR 11/27 w/possible PNA. Per PMD.  3. Altered mental status: per staff at [facility] is not unusual for him not to respond verbally at the OP unit.  4.  ESRD: on HD MWF.  HD early AM d/t hyperkalemia on labs yesterday.  Plan for next HD on Wednesday 11/29.  K 5.5 today - 1 dose lokelma given yesterday.  5. HD access: Has AVG with successful use at least twice.  Unfortunately TDC used for HD yesterday.  If again no issues on 11/29 prob can arrrange TDC removal 6.  HTN/ vol: Blood pressure good. Weights labile likely sig under dry wt. BP's not tolerating further UF. Minimal UF with HD.  7.  Secondary hyperparathyroidism: Holding sevelamer as P < 3.0. Calcium in goal.   8.  Anemia of chronic disease: Hemoglobin dropping, last 7.0. increase aranesp to 118mcg weekly.  TSAT 12% - iron course started, then held d/t concern for acute infection, will restart when off ABX.  9.  GOC - pt very frail, not interacting very much verbally. Appreciate palliative assistance. Poor prognosis. 10. Nutrition - on tube feeds - may need to change to Nepro w/recurrent hyperkalemia.  Plan for PEG tube later this week.   Cody Mow, PA-C [facility] 06/29/2022,10:23 AM  LOS: 21 days

## 2022-06-29 NOTE — Progress Notes (Signed)
   06/29/22 1507  Assess: MEWS Score  Temp 99.9 F (37.7 C)  BP (!) 146/69  MAP (mmHg) 92  Pulse Rate (!) 106  ECG Heart Rate (!) 106  Resp 17  Level of Consciousness Alert  SpO2 98 %  Assess: MEWS Score  MEWS Temp 0  MEWS Systolic 0  MEWS Pulse 1  MEWS RR 0  MEWS LOC 0  MEWS Score 1  MEWS Score Color Green  Assess: SIRS CRITERIA  SIRS Temperature  0  SIRS Pulse 1  SIRS Respirations  0  SIRS WBC 1  SIRS Score Sum  2

## 2022-06-29 NOTE — Progress Notes (Signed)
PROGRESS NOTE  Cody Macdonald EHM:[id number] DOB: 09-04-1960 DOA: 06/08/2022 PCP: Pcp, No   LOS: 21 days   Brief Narrative / Interim history: 61 year old male with CVA, HTN, ESRD on HD MWF, sent from his SNF due to shortness of breath and hypertensive urgency.  He was hypoxic requiring up to 10 L nasal cannula and had pulmonary edema on the chest x-ray.  He was initially in the ICU, underwent emergent dialysis and required Cleviprex infusion for hypertensive urgency.  With improvement he was transferred to the hospitalist service on 11/10.  Hospital course complicated by poor mentation, persistent encephalopathy and failed a swallow eval.  Palliative care was consulted and multiple GOC were held, he is now DNR and family wishes for PEG tube which should be placed later this week per IR, Plavix now on hold.  Hold aspirin as well  Subjective / 24h Interval events: Fever curve improved.  Appears alert, comfortable, does not interact much  Assesement and Plan: Principal Problem:   HCAP (healthcare-associated pneumonia) Active Problems:   ESRD (end stage renal disease) ([place])   Hypertensive urgency   Influenza A   Acute respiratory failure with hypoxia (HCC)   Protein-calorie malnutrition, severe  Principal problem Acute hypoxic respiratory failure due to multifocal pneumonia due to influenza A as well as superimposed acute pulmonary edema in the setting of fluid overload in a dialysis patient-respiratory status overall improving, currently he is on room air.  He completed a course of Tamiflu as well as empiric antibiotics, finished a 10-day course on 11/17.  -However, on 1/26 in the evening was febrile, placed on Unasyn due to suspected aspiration.  Chest x-ray with potential left lower lung opacity.  Despite being on Unasyn continues to spike temps, 102 last night.  Broaden antibiotics, obtain a CT scan of the abdomen and pelvis  Active problems  Hypertensive Urgency -patient required to be  in ICU on a Cleviprex drip, currently on labetalol, hydralazine with improvement in his blood pressure.  His blood pressures have remained stable  ESRD on HD-management as per nephrology.  Hypophosphatemia, hypokalemia and hypomagnesemia, hyperkalemia-on dialysis   Poor p.o. intake -Has been refusing to eat and not really eating very much.  PEG tube hopefully this week   H/o CVA -On Aspirin and Clopidogrel, on dysphagia 1 diet but not eating very well, Plavix on hold per IR, hold aspirin today as per IR preference   Anemia of Chronic Kidney Disease -Stable   Hypoalbuminemia -noted   Acute Metabolic Encephalopathy-overall improved but still does not engage much.   Abnormal LFTs -minimal, improving   Underweight/ Severe Malnutrition in the Context of Chronic Illness/cachexia -on tube feeds  Pressure Ulcers, not present on admission -WOC RN consulted. Continue silicone foam to the left ear as well as leave the left medial foot wounds open to the air as well as debridement agent and antimicrobial to sacrum daily and they are recommending the patient can be turned side to side and they are not can add an air mattress.  The wound care nurse also recommend Prevalon boots in place for offloading heels but recommended the staff needing to be cautious when applying straps and a foot placement when the areas have appeared.  Scheduled Meds:  aspirin  81 mg Per Tube Daily   Chlorhexidine Gluconate Cloth  6 each Topical Q0600   [START ON 07/01/2022] darbepoetin (ARANESP) injection - DIALYSIS  150 mcg Subcutaneous Q Thu-1800   diclofenac Sodium  4 g Topical Once  famotidine  20 mg Per Tube Daily   feeding supplement (PROSource TF20)  60 mL Per Tube Daily   heparin  5,000 Units Subcutaneous Q8H   leptospermum manuka honey  1 Application Topical Daily   multivitamin  1 tablet Per Tube QHS   mouth rinse  15 mL Mouth Rinse 4 times per day   thiamine (VITAMIN B1) injection  100 mg Intravenous Daily    Continuous Infusions:  [START ON 07/02/2022]  ceFAZolin (ANCEF) IV     [START ON 06/30/2022] ceFEPime (MAXIPIME) IV     feeding supplement (JEVITY 1.5 CAL/FIBER) 1,000 mL (06/29/22 0611)   [START ON 06/30/2022] vancomycin     PRN Meds:.acetaminophen, acetaminophen, albuterol, docusate sodium, guaiFENesin, hydrALAZINE, hydrOXYzine, labetalol, mouth rinse, polyethylene glycol  Current Outpatient Medications  Medication Instructions   acetaminophen (TYLENOL) 650 mg, Oral, Every 6 hours PRN   albuterol (PROVENTIL) 2.5 mg, Nebulization, Every 6 hours PRN   amLODipine (NORVASC) 10 mg, Oral, Daily   aspirin EC 81 mg, Oral, Daily, Swallow whole.   atorvastatin (LIPITOR) 80 mg, Oral, Daily at bedtime   clopidogrel (PLAVIX) 75 mg, Oral, Daily   ergocalciferol (VITAMIN D2) 50,000 Units, Every Tue   feeding supplement (ENSURE ENLIVE / ENSURE PLUS) LIQD 237 mLs, Oral, 2 times daily between meals   hydrALAZINE (APRESOLINE) 50 mg, Oral, Every 8 hours   labetalol (NORMODYNE) 300 mg, Oral, 2 times daily   melatonin 3 mg, Oral, Daily at bedtime   MULTIPLE VITAMIN PO 1 tablet, Oral, Daily at bedtime   multivitamin (RENA-VIT) TABS tablet 1 tablet, Oral, Daily at bedtime   Nutritional Supplements (NEPRO PO) Oral, 2 times daily, AHR Nepro   pantoprazole (PROTONIX) 40 mg, Oral, Daily   sevelamer carbonate (RENVELA) 800 mg, Oral, 3 times daily with meals   UNABLE TO FIND 30 mLs, Oral, 2 times daily, Liquid protein     Diet Orders (From admission, onward)     Start     Ordered   07/02/22 0001  Diet NPO time specified Except for: Sips with Meds  Diet effective midnight       Question:  Except for  Answer:  Sips with Meds   06/28/22 1649   06/24/22 1100  Diet clear liquid Room service appropriate? Yes; Fluid consistency: Thin  Diet effective now       Question Answer Comment  Room service appropriate? Yes   Fluid consistency: Thin      06/24/22 1100            DVT prophylaxis: heparin  injection 5,000 Units Start: 06/08/22 2200 SCDs Start: 06/08/22 1543   Lab Results  Component Value Date   PLT 551 (H) 06/29/2022      Code Status: DNR  Family Communication: no family at bedside, updated son over the phone  Status is: Inpatient  Remains inpatient appropriate because: severity of illness  Level of care: Progressive  Consultants:  Palliative PCCM  Objective: Vitals:   06/28/22 2100 06/29/22 0010 06/29/22 0418 06/29/22 0717  BP: 137/69 (!) 147/87 137/78 125/66  Pulse:    98  Resp:    17  Temp: 98.4 F (36.9 C) 98.2 F (36.8 C) (!) 102.1 F (38.9 C) 98.6 F (37 C)  TempSrc: Oral Oral Axillary Oral  SpO2: 100%  99% 98%  Weight:  47.9 kg    Height:        Intake/Output Summary (Last 24 hours) at 06/29/2022 1032 Last data filed at 06/29/2022 1007 Gross per 24 hour  Intake 5475 ml  Output 0 ml  Net 5475 ml    Wt Readings from Last 3 Encounters:  06/29/22 47.9 kg  04/12/22 48.2 kg  01/01/22 52.3 kg    Examination: Constitutional: NAD Eyes: lids and conjunctivae normal, no scleral icterus ENMT: mmm Neck: normal, supple Respiratory: clear to auscultation bilaterally, no wheezing, no crackles. Cardiovascular: Regular rate and rhythm, no murmurs / rubs / gallops. No LE edema. Abdomen: soft, no distention, no tenderness. Bowel sounds positive.  Skin: no rashes Neurologic: no focal deficits, equal strength  Data Reviewed: I have independently reviewed following labs and imaging studies   CBC Recent Labs  Lab 06/23/22 0051 06/24/22 0026 06/25/22 0029 06/27/22 0802 06/29/22 0049  WBC 6.0 6.9 8.6 10.4 12.9*  HGB 8.1* 7.4* 7.6* 7.1* 7.0*  HCT 26.0* 23.2* 23.3* 22.0* 22.6*  PLT 283 284 329 395 551*  MCV 87.5 86.6 86.3 85.6 86.6  MCH 27.3 27.6 28.1 27.6 26.8  MCHC 31.2 31.9 32.6 32.3 31.0  RDW 15.3 15.3 15.6* 15.5 15.8*  LYMPHSABS 1.8  --   --   --   --   MONOABS 0.6  --   --   --   --   EOSABS 0.2  --   --   --   --   BASOSABS 0.0   --   --   --   --      Recent Labs  Lab 06/23/22 0051 06/24/22 0026 06/25/22 0029 06/27/22 0802 06/29/22 0049  NA 135 133* 135 134* 136  K 3.8 4.3 5.4* 6.6* 5.5*  CL 96* 92* 94* 92* 96*  CO2 30 28 29 28 26   GLUCOSE 107* 129* 122* 91 104*  BUN 35* 59* 84* 77* 66*  CREATININE 3.28* 4.68* 5.70* 5.03* 4.04*  CALCIUM 9.0 9.1 8.9 8.6* 8.5*  AST 46* 39  --  44*  --   ALT 25 24  --  24  --   ALKPHOS 75 67  --  83  --   BILITOT 0.1* 0.4  --  0.4  --   ALBUMIN 1.7* 1.7*  --  1.6*  --   MG 1.7  --  2.4  --   --      ------------------------------------------------------------------------------------------------------------------ No results for input(s): "CHOL", "HDL", "LDLCALC", "TRIG", "CHOLHDL", "LDLDIRECT" in the last 72 hours.  No results found for: "HGBA1C" ------------------------------------------------------------------------------------------------------------------ No results for input(s): "TSH", "T4TOTAL", "T3FREE", "THYROIDAB" in the last 72 hours.  Invalid input(s): "FREET3"  Cardiac Enzymes No results for input(s): "CKMB", "TROPONINI", "MYOGLOBIN" in the last 168 hours.  Invalid input(s): "CK" ------------------------------------------------------------------------------------------------------------------    Component Value Date/Time   BNP >4,500.0 (H) 04/04/2022 0513    CBG: Recent Labs  Lab 06/28/22 1602 06/28/22 2026 06/29/22 0017 06/29/22 0428 06/29/22 0718  GLUCAP 115* 118* 87 114* 149*     Recent Results (from the past 240 hour(s))  Culture, blood (Routine X 2) w Reflex to ID Panel     Status: None (Preliminary result)   Collection Time: 06/27/22  7:51 AM   Specimen: BLOOD  Result Value Ref Range Status   Specimen Description BLOOD RIGHT ANTECUBITAL  Final   Special Requests   Final    BOTTLES DRAWN AEROBIC AND ANAEROBIC Blood Culture results may not be optimal due to an inadequate volume of blood received in culture bottles   Culture    Final    NO GROWTH 2 DAYS Performed at [facility] [address] [place], [place] [address]  Report Status PENDING  Incomplete  Culture, blood (Routine X 2) w Reflex to ID Panel     Status: None (Preliminary result)   Collection Time: 06/27/22  8:02 AM   Specimen: BLOOD RIGHT FOREARM  Result Value Ref Range Status   Specimen Description BLOOD RIGHT FOREARM  Final   Special Requests   Final    BOTTLES DRAWN AEROBIC ONLY Blood Culture results may not be optimal due to an inadequate volume of blood received in culture bottles   Culture   Final    NO GROWTH 2 DAYS Performed at [facility], [place] [address], [place], [place] [address]    Report Status PENDING  Incomplete      Radiology Studies: No results found.   Marzetta Board, MD, PhD [facility]  Between 7 am - 7 pm I am available, please contact me via Amion (for emergencies) or Securechat (non urgent messages)  Between 7 pm - 7 am I am not available, please contact night coverage MD/APP via Amion

## 2022-06-29 NOTE — Plan of Care (Signed)
  Problem: Nutrition: Goal: Adequate nutrition will be maintained Outcome: Progressing   Problem: Elimination: Goal: Will not experience complications related to bowel motility Outcome: Progressing   Problem: Pain Managment: Goal: General experience of comfort will improve Outcome: Progressing   Problem: Safety: Goal: Ability to remain free from injury will improve Outcome: Progressing   

## 2022-06-29 NOTE — Progress Notes (Signed)
Informed by Radiology new IV need to placed for patient to obtain CT ABD.

## 2022-06-30 DIAGNOSIS — J189 Pneumonia, unspecified organism: Secondary | ICD-10-CM | POA: Diagnosis not present

## 2022-06-30 LAB — SARS CORONAVIRUS 2 BY RT PCR: SARS Coronavirus 2 by RT PCR: NEGATIVE

## 2022-06-30 LAB — COMPREHENSIVE METABOLIC PANEL
ALT: 39 U/L (ref 0–44)
AST: 62 U/L — ABNORMAL HIGH (ref 15–41)
Albumin: <1.5 g/dL — ABNORMAL LOW (ref 3.5–5.0)
Alkaline Phosphatase: 117 U/L (ref 38–126)
Anion gap: 15 (ref 5–15)
BUN: 90 mg/dL — ABNORMAL HIGH (ref 6–20)
CO2: 23 mmol/L (ref 22–32)
Calcium: 8.1 mg/dL — ABNORMAL LOW (ref 8.9–10.3)
Chloride: 92 mmol/L — ABNORMAL LOW (ref 98–111)
Creatinine, Ser: 5 mg/dL — ABNORMAL HIGH (ref 0.61–1.24)
GFR, Estimated: 12 mL/min — ABNORMAL LOW (ref >60)
Glucose, Bld: 96 mg/dL (ref 70–99)
Potassium: 5.8 mmol/L — ABNORMAL HIGH (ref 3.5–5.1)
Sodium: 130 mmol/L — ABNORMAL LOW (ref 135–145)
Total Bilirubin: 0.5 mg/dL (ref 0.3–1.2)
Total Protein: 6.4 g/dL — ABNORMAL LOW (ref 6.5–8.1)

## 2022-06-30 LAB — RESPIRATORY PANEL BY PCR

## 2022-06-30 LAB — CBC
HCT: 21.8 % — ABNORMAL LOW (ref 39.0–52.0)
Hemoglobin: 7 g/dL — ABNORMAL LOW (ref 13.0–17.0)
MCH: 26.9 pg (ref 26.0–34.0)
MCHC: 32.1 g/dL (ref 30.0–36.0)
MCV: 83.8 fL (ref 80.0–100.0)
Platelets: 610 10*3/uL — ABNORMAL HIGH (ref 150–400)
RBC: 2.6 MIL/uL — ABNORMAL LOW (ref 4.22–5.81)
RDW: 16 % — ABNORMAL HIGH (ref 11.5–15.5)
WBC: 13.7 10*3/uL — ABNORMAL HIGH (ref 4.0–10.5)
nRBC: 0 % (ref 0.0–0.2)

## 2022-06-30 LAB — HEMOGLOBIN AND HEMATOCRIT, BLOOD
HCT: 22.6 % — ABNORMAL LOW (ref 39.0–52.0)
Hemoglobin: 7.2 g/dL — ABNORMAL LOW (ref 13.0–17.0)

## 2022-06-30 LAB — GLUCOSE, CAPILLARY
Glucose-Capillary: 104 mg/dL — ABNORMAL HIGH (ref 70–99)
Glucose-Capillary: 72 mg/dL (ref 70–99)
Glucose-Capillary: 88 mg/dL (ref 70–99)
Glucose-Capillary: 91 mg/dL (ref 70–99)
Glucose-Capillary: 93 mg/dL (ref 70–99)
Glucose-Capillary: 94 mg/dL (ref 70–99)

## 2022-06-30 LAB — MAGNESIUM: Magnesium: 2.6 mg/dL — ABNORMAL HIGH (ref 1.7–2.4)

## 2022-06-30 MED ORDER — PENTAFLUOROPROP-TETRAFLUOROETH EX AERO: 1.0000 | INHALATION_SPRAY | CUTANEOUS | Status: DC | PRN

## 2022-06-30 MED ORDER — ALTEPLASE 2 MG IJ SOLR: 2.0000 mg | Freq: Once | INTRAMUSCULAR | Status: DC | PRN

## 2022-06-30 MED ORDER — ANTICOAGULANT SODIUM CITRATE 4% (200MG/5ML) IV SOLN: 5.0000 mL | Status: DC | PRN

## 2022-06-30 MED ORDER — LIDOCAINE-PRILOCAINE 2.5-2.5 % EX CREA: 1.0000 | TOPICAL_CREAM | CUTANEOUS | Status: DC | PRN

## 2022-06-30 MED ORDER — HEPARIN SODIUM (PORCINE) 1000 UNIT/ML DIALYSIS: 1000.0000 [IU] | INTRAMUSCULAR | Status: DC | PRN

## 2022-06-30 MED ORDER — LIDOCAINE HCL (PF) 1 % IJ SOLN: 5.0000 mL | INTRAMUSCULAR | Status: DC | PRN

## 2022-06-30 NOTE — Progress Notes (Signed)
[facility] Progress Note   Subjective:   Patient seen and examined at bedside in dialysis.  Tells me he has had a BM.  Tolerating treatment well so far using AVG.  Denies CP, SOB, abdominal pain and n/v/d.   Objective Vitals:   06/30/22 0431 06/30/22 0715 06/30/22 1013 06/30/22 1031  BP: 138/62 139/68 (!) 143/74 (!) 147/78  Pulse: 97 99 97 100  Resp: 20 (!) 23 (!) 21 (!) 22  Temp: 98 F (36.7 C) 98.3 F (36.8 C) 98.6 F (37 C)   TempSrc: Axillary Oral Axillary   SpO2:  97% 97% 99%  Weight: 35.4 kg     Height:       Physical Exam General:chronically ill, cachetic male in NAD Heart:RRR, no mrg Lungs:CTA anteriorly, nml WOB on RA Abdomen:soft, NTND Extremities:no LE edema Dialysis Access: LU AVF +b/t, +TDC   Filed Weights   06/28/22 0355 06/29/22 0010 06/30/22 0431  Weight: 48.9 kg 47.9 kg 35.4 kg    Intake/Output Summary (Last 24 hours) at 06/30/2022 1059 Last data filed at 06/30/2022 9326 Gross per 24 hour  Intake 1156 ml  Output 0 ml  Net 1156 ml    Additional Objective Labs: Basic Metabolic Panel: Recent Labs  Lab 06/27/22 0802 06/29/22 0049 06/30/22 0051  NA 134* 136 130*  K 6.6* 5.5* 5.8*  CL 92* 96* 92*  CO2 28 26 23   GLUCOSE 91 104* 96  BUN 77* 66* 90*  CREATININE 5.03* 4.04* 5.00*  CALCIUM 8.6* 8.5* 8.1*   Liver Function Tests: Recent Labs  Lab 06/24/22 0026 06/27/22 0802 06/30/22 0051  AST 39 44* 62*  ALT 24 24 39  ALKPHOS 67 83 117  BILITOT 0.4 0.4 0.5  PROT 6.0* 6.2* 6.4*  ALBUMIN 1.7* 1.6* <1.5*   CBC: Recent Labs  Lab 06/24/22 0026 06/25/22 0029 06/27/22 0802 06/29/22 0049 06/30/22 0051  WBC 6.9 8.6 10.4 12.9* 13.7*  HGB 7.4* 7.6* 7.1* 7.0* 7.0*  HCT 23.2* 23.3* 22.0* 22.6* 21.8*  MCV 86.6 86.3 85.6 86.6 83.8  PLT 284 329 395 551* 610*   Blood Culture    Component Value Date/Time   SDES BLOOD RIGHT FOREARM 06/27/2022 0802   SPECREQUEST  06/27/2022 0802    BOTTLES DRAWN AEROBIC ONLY Blood Culture  results may not be optimal due to an inadequate volume of blood received in culture bottles   CULT  06/27/2022 0802    NO GROWTH 3 DAYS Performed at [facility], [address], [place], [place] [address]    REPTSTATUS PENDING 06/27/2022 0802   CBG: Recent Labs  Lab 06/29/22 1530 06/29/22 2010 06/30/22 0022 06/30/22 0428 06/30/22 0807  GLUCAP 98 90 72 94 93    Studies/Results: CT CHEST ABDOMEN PELVIS W CONTRAST  Result Date: 06/29/2022 CLINICAL DATA:  Sepsis. EXAM: CT CHEST, ABDOMEN, AND PELVIS WITH CONTRAST TECHNIQUE: Multidetector CT imaging of the chest, abdomen and pelvis was performed following the standard protocol during bolus administration of intravenous contrast. RADIATION DOSE REDUCTION: This exam was performed according to the departmental dose-optimization program which includes automated exposure control, adjustment of the mA and/or kV according to patient size and/or use of iterative reconstruction technique. CONTRAST:  33mL OMNIPAQUE IOHEXOL 350 MG/ML SOLN COMPARISON:  May 26, 2022. FINDINGS: CT CHEST FINDINGS Cardiovascular: No significant vascular findings. Normal heart size. No pericardial effusion. Mediastinum/Nodes: No enlarged mediastinal, hilar, or axillary lymph nodes. Thyroid gland, trachea, and esophagus demonstrate no significant findings. Lungs/Pleura: No pneumothorax or pleural effusion is  noted. Left lower lobe airspace opacity is noted concerning for infiltrate or atelectasis. 4 mm nodule is noted in right lung apex best seen on image number 41 of series 4. Minimal right basilar subsegmental atelectasis is noted. Musculoskeletal: No chest wall mass or suspicious bone lesions identified. CT ABDOMEN PELVIS FINDINGS Hepatobiliary: No focal liver abnormality is seen. No gallstones, gallbladder wall thickening, or biliary dilatation. Pancreas: Unremarkable. No pancreatic ductal dilatation or surrounding inflammatory changes. Spleen: Normal in size without  focal abnormality. Adrenals/Urinary Tract: Adrenal glands are unremarkable. Kidneys are normal, without renal calculi, focal lesion, or hydronephrosis. Bladder is unremarkable. Stomach/Bowel: Distal tip of feeding tube is seen in distal stomach. The appendix is unremarkable. Residual stool and contrast is noted throughout the nondilated colon. There is no evidence of bowel obstruction. Vascular/Lymphatic: Aortic atherosclerosis. No enlarged abdominal or pelvic lymph nodes. 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 Reproductive: Prostate gland not well visualized. Other: No abdominal wall hernia or abnormality. No abdominopelvic ascites. Musculoskeletal: Stable lucent lesions are noted in the right iliac bones which may be benign, but metastatic disease cannot be excluded. IMPRESSION: Mild left lower lobe airspace opacity is noted concerning for pneumonia or atelectasis. Minimal right basilar subsegmental atelectasis is noted. 4 mm nodule is noted in right lung apex. No follow-up needed if patient is low-risk.This recommendation follows the consensus statement: Guidelines for Management of Incidental Pulmonary Nodules Detected on CT Images: From the Fleischner Society 2017; Radiology 2017; 284:228-243. Stable lucent lesions are noted in the right iliac bones which may be benign, but metastatic disease cannot be excluded. If there is clinical concern for metastatic disease, bone scan can be performed further evaluation. No other significant abnormality seen in the abdomen or pelvis. Aortic Atherosclerosis (ICD10-I70.0). Electronically Signed   By: Marijo Conception M.D.   On: 06/29/2022 13:40    Medications:  anticoagulant sodium citrate     [START ON 07/02/2022]  ceFAZolin (ANCEF) IV     ceFEPime (MAXIPIME) IV     feeding supplement (NEPRO CARB STEADY) 40 mL/hr at 06/30/22 0347   vancomycin      Chlorhexidine Gluconate Cloth  6 each Topical Q0600    Chlorhexidine Gluconate Cloth  6 each Topical Q0600   [START ON 07/01/2022] darbepoetin (ARANESP) injection - DIALYSIS  150 mcg Subcutaneous Q Thu-1800   diclofenac Sodium  4 g Topical Once   famotidine  20 mg Per Tube Daily   feeding supplement (PROSource TF20)  60 mL Per Tube Daily   heparin  5,000 Units Subcutaneous Q8H   leptospermum manuka honey  1 Application Topical Daily   multivitamin  1 tablet Per Tube QHS   nutrition supplement (JUVEN)  1 packet Per Tube BID BM   mouth rinse  15 mL Mouth Rinse 4 times per day   sodium hypochlorite   Topical BID   thiamine (VITAMIN B1) injection  100 mg Intravenous Daily    Dialysis Orders: MWF at [facility]  3.5h  48kg  2.2/5 bath  300/600  Hep none R AVG/TDC (AVG ok to use reportedly)    CXR 11/10- bilat splotchy perihilar disease, edema vs infxn, improved compared to 11/8 film   Assessment/ Plan: 1.  Resolved acute hypoxic respiratory failure: Now stable on RA. Due to combination of pulmonary edema/volume overload with bronchospasm along with influenza +/- and probable HCAP.  Previously improving, CXR 11/27 w/ Probable retrocardiac LLL opacity suspicious for airspace disease/pneumonia. CT w/possible LLL  PNA, no other acute abnormalities. ABX started. Per PMD.  2. Fever/leukocytosis - Afebrile x 24hrs. WBC trending up today despite ABX. BC w/NGTD.  CXR 11/27 & CT 11/28 w/possible PNA. Per PMD.  3. Altered mental status: per staff at [facility] is not unusual for him not to respond verbally at the OP unit.  4.  ESRD: on HD MWF.  HD today per regular schedule. K 5.8 this AM pre HD.  5. HD access: Has AVG with successful use at least twice.  So far successful use of AVG today, if no issues will arrange TDC removal.  6.  HTN/ vol: Blood pressure good. Weights labile likely sig under dry wt. BP's not tolerating further UF. Minimal UF with HD.  7.  Secondary hyperparathyroidism: Holding sevelamer as P < 3.0. Calcium in goal.   8.  Anemia  of chronic disease: Hemoglobin dropping, last 7.0. increase aranesp to 129mcg weekly.  TSAT 12% - iron course started, then held d/t concern for acute infection, will restart when off ABX.  9.  GOC - pt very frail, not interacting very much verbally. Appreciate palliative assistance. Poor prognosis. 10. Nutrition - on tube feeds - changed to Nepro.  Plan for PEG tube placement by IR on 12/1.    Jen Mow, PA-C [facility] 06/30/2022,10:59 AM  LOS: 22 days

## 2022-06-30 NOTE — Progress Notes (Signed)
Patient back in room from dialysis ( 1L of fluid remove per report)  Vital signs obtained. Orders reviewed.

## 2022-06-30 NOTE — Progress Notes (Signed)
   06/30/22 1331  Pain Assessment  Pain Scale FLACC  Pain Score 0  Faces Pain Scale 0  Fistula / Graft Left Upper arm Arteriovenous fistula  No placement date or time found.   Placed prior to admission: Yes  Orientation: Left  Access Location: Upper arm  Access Type: Arteriovenous fistula  Site Condition No complications  Neurological  Level of Consciousness Alert  Orientation Level Oriented to person  Respiratory  Respiratory Pattern Regular  Chest Assessment Chest expansion symmetrical  Bilateral Breath Sounds Diminished  Cardiac  Cardiac Rhythm NSR   Received patient in bed to unit.  Alert and oriented.  Informed consent signed and in chart.   Treatment initiated: 1013A Treatment completed: 1331P  Patient tolerated well.  Transported back to the room  Alert, without acute distress.  Hand-off given to patient's nurse.   Access used: Yes Access issues: No  Total UF removed: 1000 Medication(s) given: Vancomycin IV Post HD VS: 98.6 (Axillary), 96, 26, 128/67, O2 98 R/A Post HD weight: [facility]

## 2022-06-30 NOTE — Progress Notes (Signed)
   06/30/22 0715  Vitals  Temp 98.3 F (36.8 C)  Temp Source Oral  BP 139/68  MAP (mmHg) 89  BP Location Right Arm  BP Method Automatic  Patient Position (if appropriate) Lying  Pulse Rate 99  Pulse Rate Source Monitor  ECG Heart Rate 99  Resp (!) 23  MEWS COLOR  MEWS Score Color Green  Oxygen Therapy  SpO2 97 %  MEWS Score  MEWS Temp 0  MEWS Systolic 0  MEWS Pulse 0  MEWS RR 1  MEWS LOC 0  MEWS Score 1

## 2022-06-30 NOTE — Progress Notes (Addendum)
PROGRESS NOTE    Cody Macdonald  MOQ:[id number] DOB: 06-06-1961 DOA: 06/08/2022 PCP: Pcp, No  Chief Complaint  Patient presents with   Shortness of Breath    Brief Narrative:  61 year old male with CVA, HTN, ESRD on HD MWF, sent from his SNF due to shortness of breath and hypertensive urgency.  He was hypoxic requiring up to 10 L nasal cannula and had pulmonary edema on the chest x-ray.  He was initially in the ICU, underwent emergent dialysis and required Cleviprex infusion for hypertensive urgency.  With improvement he was transferred to the hospitalist service on 11/10.  Hospital course complicated by poor mentation, persistent encephalopathy and failed Brindley Madarang swallow eval.  Palliative care was consulted and multiple GOC were held, he is now DNR and family wishes for PEG tube which should be placed later this week per IR, Plavix now on hold.  Hold aspirin as well   Assessment & Plan:   Principal Problem:   HCAP (healthcare-associated pneumonia) Active Problems:   ESRD (end stage renal disease) ([place])   Hypertensive urgency   Influenza Warden Buffa   Acute respiratory failure with hypoxia (HCC)   Protein-calorie malnutrition, severe  Acute hypoxic respiratory failure due to multifocal pneumonia due to influenza Jacob Chamblee as well as superimposed acute pulmonary edema in the setting of fluid overload in [facility] dialysis patient-respiratory status overall improving, currently he is on room air.  He completed Asiana Benninger course of Tamiflu as well as empiric antibiotics, finished Docie Abramovich 10-day course on 11/17.   Fever CT C/[place]/P with mild LLL airspace opacity (pneumonia vs atelectasis) Had stable lucent lesions in the right iliac bone (if concern for metastatic disease, consider bone scan) Follow RVP, covid Continue cefepime and vancomycin Cultures 11/26 [id number] With foul smelling pressure wounds noted 11/28, these maybe most likely source of fever  Hypertensive Urgency -patient required ICU level of care for Ernesha Ramone Cleviprex drip ---  currently on labetalol, hydralazine with improvement in his blood pressure.  His blood pressures have remained stable   ESRD on HD-management as per nephrology.   Hypophosphatemia, hypokalemia and hypomagnesemia, hyperkalemia-per renal   Poor p.o. intake -Has been refusing to eat and not really eating very much.  PEG tube hopefully this week, 11/30 it looks like   H/o CVA -On Aspirin and Clopidogrel, on dysphagia 1 diet but not eating very well, Plavix on hold per IR, hold aspirin today as per IR preference   Anemia of Chronic Kidney Disease  Hb gradually downtrending, chronic disease, iron def, follow   Hypoalbuminemia -noted   Acute Metabolic Encephalopathy-overall improved but still does not engage much.   Abnormal LFTs -minimal, improving   Underweight/ Severe Malnutrition in the Context of Chronic Illness/cachexia -on tube feeds   Pressure Ulcers, not present on admission See 11/28 note, reevaluation of wounds to sacrum L foot and L heel.  Has large foul smelling unstageable wounds on the L buttock close to anus, L ischium, coccyx.  7 days of dakins.  Stage 2 to R sacrum.  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  New DTPI to LL leg.  Planning for iodine.  Prevalon boot.  Right iliac crest has DTPI.  Standard size bed with air mattress.  Due to rapid decline, they're recommending discussions regarding comfort measures.  Will reengage with palliative care.      DVT prophylaxis: heparin Code Status: dnr Family Communication: none at bedside - no answer from son Disposition:   Status is: Inpatient Remains inpatient appropriate because: continued need for  inpatient care   Consultants:  Renal  Palliative IR  Procedures:  none  Antimicrobials:  Anti-infectives (From admission, onward)    Start     Dose/Rate Route Frequency Ordered Stop   07/02/22 0000  ceFAZolin (ANCEF) IVPB 2g/100 mL premix        2 g 200 mL/hr over 30 Minutes Intravenous To Radiology 06/28/22 1653 07/03/22 0000    06/30/22 2000  ceFEPIme (MAXIPIME) 1 g in sodium chloride 0.9 % 100 mL IVPB        1 g 200 mL/hr over 30 Minutes Intravenous Every 24 hours 06/29/22 0517     06/30/22 1200  vancomycin (VANCOREADY) IVPB 500 mg/100 mL        500 mg 100 mL/hr over 60 Minutes Intravenous Every M-W-F (Hemodialysis) 06/29/22 0517     06/29/22 0615  vancomycin (VANCOCIN) IVPB 1000 mg/200 mL premix        1,000 mg 200 mL/hr over 60 Minutes Intravenous  Once 06/29/22 0517 06/29/22 0712   06/29/22 0615  ceFEPIme (MAXIPIME) 2 g in sodium chloride 0.9 % 100 mL IVPB        2 g 200 mL/hr over 30 Minutes Intravenous  Once 06/29/22 0517 06/29/22 0614   06/27/22 1200  ampicillin-sulbactam (UNASYN) 1.5 g in sodium chloride 0.9 % 100 mL IVPB  Status:  Discontinued        1.5 g 200 mL/hr over 30 Minutes Intravenous Every 12 hours 06/27/22 1101 06/29/22 0501   06/15/22 0900  ceFEPIme (MAXIPIME) 2 g in sodium chloride 0.9 % 100 mL IVPB        2 g 200 mL/hr over 30 Minutes Intravenous  Once 06/15/22 0804 06/15/22 1044   06/14/22 1200  ceFEPIme (MAXIPIME) 2 g in sodium chloride 0.9 % 100 mL IVPB  Status:  Discontinued        2 g 200 mL/hr over 30 Minutes Intravenous Every M-W-F (Hemodialysis) 06/14/22 1008 06/18/22 1429   06/14/22 1200  vancomycin (VANCOREADY) IVPB 500 mg/100 mL  Status:  Discontinued        500 mg 100 mL/hr over 60 Minutes Intravenous Every M-W-F (Hemodialysis) 06/14/22 1008 06/18/22 1429   06/14/22 1100  vancomycin (VANCOCIN) IVPB 1000 mg/200 mL premix        1,000 mg 200 mL/hr over 60 Minutes Intravenous  Once 06/14/22 1000 06/14/22 1600   06/14/22 1100  ceFEPIme (MAXIPIME) 2 g in sodium chloride 0.9 % 100 mL IVPB        2 g 200 mL/hr over 30 Minutes Intravenous  Once 06/14/22 1000 06/14/22 1259   06/12/22 2000  oseltamivir (TAMIFLU) capsule 30 mg        30 mg Oral  Once 06/12/22 1440 06/12/22 2149   06/10/22 2100  oseltamivir (TAMIFLU) capsule 30 mg        30 mg Oral  Once 06/10/22 0825 06/11/22 0517    06/10/22 1030  cefTRIAXone (ROCEPHIN) 2 g in sodium chloride 0.9 % 100 mL IVPB  Status:  Discontinued        2 g 200 mL/hr over 30 Minutes Intravenous Every 24 hours 06/10/22 0945 06/14/22 1000   06/09/22 2200  ceFEPIme (MAXIPIME) 1 g in sodium chloride 0.9 % 100 mL IVPB  Status:  Discontinued        1 g 200 mL/hr over 30 Minutes Intravenous Every 24 hours 06/08/22 1611 06/10/22 0945   06/09/22 1015  oseltamivir (TAMIFLU) capsule 30 mg        30 mg  Oral  Once 06/09/22 0920 06/09/22 1045   06/08/22 1830  oseltamivir (TAMIFLU) capsule 30 mg  Status:  Discontinued        30 mg Oral  Once 06/08/22 1818 06/09/22 0920   06/08/22 1612  vancomycin variable dose per unstable renal function (pharmacist dosing)  Status:  Discontinued         Does not apply See admin instructions 06/08/22 1612 06/09/22 0920   06/08/22 1100  vancomycin (VANCOCIN) IVPB 1000 mg/200 mL premix        1,000 mg 200 mL/hr over 60 Minutes Intravenous  Once 06/08/22 1057 06/08/22 1329   06/08/22 1100  ceFEPIme (MAXIPIME) 2 g in sodium chloride 0.9 % 100 mL IVPB        2 g 200 mL/hr over 30 Minutes Intravenous  Once 06/08/22 1057 06/08/22 1225       Subjective: Difficult to understand as he was curled up and speaking into the bed No new complaints  Objective: Vitals:   06/30/22 1300 06/30/22 1331 06/30/22 1451 06/30/22 1556  BP: 102/62 128/67 130/65 (!) 147/77  Pulse: 94 98 100 100  Resp: 19 (!) 26 (!) 25 (!) 21  Temp:  98.3 F (36.8 C) 98.6 F (37 C)   TempSrc:  Axillary Axillary   SpO2: 100% 100%  100%  Weight:      Height:        Intake/Output Summary (Last 24 hours) at 06/30/2022 1737 Last data filed at 06/30/2022 1331 Gross per 24 hour  Intake 1040 ml  Output 1000 ml  Net 40 ml   Filed Weights   06/28/22 0355 06/29/22 0010 06/30/22 0431  Weight: 48.9 kg 47.9 kg 35.4 kg    Examination:  General exam: Appears calm and comfortable - he's curled up on his R side, difficult to get him to interact  with me Respiratory system: unlabored Cardiovascular system: tachy, regular Gastrointestinal system: Abdomen is nondistended, soft and nontender.  Central nervous system: Alert.  Extremities: no LEE, contractures   Data Reviewed: I have personally reviewed following labs and imaging studies  CBC: Recent Labs  Lab 06/24/22 0026 06/25/22 0029 06/27/22 0802 06/29/22 0049 06/30/22 0051  WBC 6.9 8.6 10.4 12.9* 13.7*  HGB 7.4* 7.6* 7.1* 7.0* 7.0*  HCT 23.2* 23.3* 22.0* 22.6* 21.8*  MCV 86.6 86.3 85.6 86.6 83.8  PLT 284 329 395 551* 610*    Basic Metabolic Panel: Recent Labs  Lab 06/24/22 0026 06/25/22 0029 06/27/22 0802 06/29/22 0049 06/30/22 0051  NA 133* 135 134* 136 130*  K 4.3 5.4* 6.6* 5.5* 5.8*  CL 92* 94* 92* 96* 92*  CO2 28 29 28 26 23   GLUCOSE 129* 122* 91 104* 96  BUN 59* 84* 77* 66* 90*  CREATININE 4.68* 5.70* 5.03* 4.04* 5.00*  CALCIUM 9.1 8.9 8.6* 8.5* 8.1*  MG  --  2.4  --   --  2.6*    GFR: Estimated Creatinine Clearance: 7.9 mL/min (Aeliana Spates) (by C-G formula based on SCr of 5 mg/dL (H)).  Liver Function Tests: Recent Labs  Lab 06/24/22 0026 06/27/22 0802 06/30/22 0051  AST 39 44* 62*  ALT 24 24 39  ALKPHOS 67 83 117  BILITOT 0.4 0.4 0.5  PROT 6.0* 6.2* 6.4*  ALBUMIN 1.7* 1.6* <1.5*    CBG: Recent Labs  Lab 06/30/22 0022 06/30/22 0428 06/30/22 0807 06/30/22 1449 06/30/22 1613  GLUCAP 72 94 93 91 88     Recent Results (from the past 240 hour(s))  Culture, blood (  Routine X 2) w Reflex to ID Panel     Status: None (Preliminary result)   Collection Time: 06/27/22  7:51 AM   Specimen: BLOOD  Result Value Ref Range Status   Specimen Description BLOOD RIGHT ANTECUBITAL  Final   Special Requests   Final    BOTTLES DRAWN AEROBIC AND ANAEROBIC Blood Culture results may not be optimal due to an inadequate volume of blood received in culture bottles   Culture   Final    NO GROWTH 3 DAYS Performed at [facility], [place] [address]  [place], [place] [address]    Report Status PENDING  Incomplete  Culture, blood (Routine X 2) w Reflex to ID Panel     Status: None (Preliminary result)   Collection Time: 06/27/22  8:02 AM   Specimen: BLOOD RIGHT FOREARM  Result Value Ref Range Status   Specimen Description BLOOD RIGHT FOREARM  Final   Special Requests   Final    BOTTLES DRAWN AEROBIC ONLY Blood Culture results may not be optimal due to an inadequate volume of blood received in culture bottles   Culture   Final    NO GROWTH 3 DAYS Performed at [facility], [place] [address], [place], [place] [address]    Report Status PENDING  Incomplete         Radiology Studies: CT CHEST ABDOMEN PELVIS W CONTRAST  Result Date: 06/29/2022 CLINICAL DATA:  Sepsis. EXAM: CT CHEST, ABDOMEN, AND PELVIS WITH CONTRAST TECHNIQUE: Multidetector CT imaging of the chest, abdomen and pelvis was performed following the standard protocol during bolus administration of intravenous contrast. RADIATION DOSE REDUCTION: This exam was performed according to the departmental dose-optimization program which includes automated exposure control, adjustment of the mA and/or kV according to patient size and/or use of iterative reconstruction technique. CONTRAST:  80mL OMNIPAQUE IOHEXOL 350 MG/ML SOLN COMPARISON:  May 26, 2022. FINDINGS: CT CHEST FINDINGS Cardiovascular: No significant vascular findings. Normal heart size. No pericardial effusion. Mediastinum/Nodes: No enlarged mediastinal, hilar, or axillary lymph nodes. Thyroid gland, trachea, and esophagus demonstrate no significant findings. Lungs/Pleura: No pneumothorax or pleural effusion is noted. Left lower lobe airspace opacity is noted concerning for infiltrate or atelectasis. 4 mm nodule is noted in right lung apex best seen on image number 41 of series 4. Minimal right basilar subsegmental atelectasis is noted. Musculoskeletal: No chest wall mass or suspicious bone lesions identified. CT ABDOMEN PELVIS  FINDINGS Hepatobiliary: No focal liver abnormality is seen. No gallstones, gallbladder wall thickening, or biliary dilatation. Pancreas: Unremarkable. No pancreatic ductal dilatation or surrounding inflammatory changes. Spleen: Normal in size without focal abnormality. Adrenals/Urinary Tract: Adrenal glands are unremarkable. Kidneys are normal, without renal calculi, focal lesion, or hydronephrosis. Bladder is unremarkable. Stomach/Bowel: Distal tip of feeding tube is seen in distal stomach. The appendix is unremarkable. Residual stool and contrast is noted throughout the nondilated colon. There is no evidence of bowel obstruction. Vascular/Lymphatic: Aortic atherosclerosis. No enlarged abdominal or pelvic lymph nodes. 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 Reproductive: Prostate gland not well visualized. Other: No abdominal wall hernia or abnormality. No abdominopelvic ascites. Musculoskeletal: Stable lucent lesions are noted in the right iliac bones which may be benign, but metastatic disease cannot be excluded. IMPRESSION: Mild left lower lobe airspace opacity is noted concerning for pneumonia or atelectasis. Minimal right basilar subsegmental atelectasis is noted. 4 mm nodule is noted in right lung apex. No follow-up needed if patient is  low-risk.This recommendation follows the consensus statement: Guidelines for Management of Incidental Pulmonary Nodules Detected on CT Images: From the Fleischner Society 2017; Radiology 2017; 284:228-243. Stable lucent lesions are noted in the right iliac bones which may be benign, but metastatic disease cannot be excluded. If there is clinical concern for metastatic disease, bone scan can be performed further evaluation. No other significant abnormality seen in the abdomen or pelvis. Aortic Atherosclerosis (ICD10-I70.0). Electronically Signed   By: Marijo Conception M.D.   On: 06/29/2022 13:40        Scheduled Meds:   Chlorhexidine Gluconate Cloth  6 each Topical Q0600   Chlorhexidine Gluconate Cloth  6 each Topical Q0600   [START ON 07/01/2022] darbepoetin (ARANESP) injection - DIALYSIS  150 mcg Subcutaneous Q Thu-1800   diclofenac Sodium  4 g Topical Once   famotidine  20 mg Per Tube Daily   feeding supplement (PROSource TF20)  60 mL Per Tube Daily   heparin  5,000 Units Subcutaneous Q8H   leptospermum manuka honey  1 Application Topical Daily   multivitamin  1 tablet Per Tube QHS   nutrition supplement (JUVEN)  1 packet Per Tube BID BM   mouth rinse  15 mL Mouth Rinse 4 times per day   sodium hypochlorite   Topical BID   thiamine (VITAMIN B1) injection  100 mg Intravenous Daily   Continuous Infusions:  [START ON 07/02/2022]  ceFAZolin (ANCEF) IV     ceFEPime (MAXIPIME) IV     feeding supplement (NEPRO CARB STEADY) 1,000 mL (06/30/22 1123)   vancomycin 500 mg (06/30/22 1219)     LOS: 22 days    Time spent: over 30 min    Fayrene Helper, MD [facility]   To contact the attending provider between 7A-7P or the covering provider during after hours 7P-7A, please log into the web site [email or web address] and access using universal [place] password for that web site. If you do not have the password, please call the hospital operator.  06/30/2022, 5:38 PM

## 2022-07-01 ENCOUNTER — Inpatient Hospital Stay (HOSPITAL_COMMUNITY): Payer: Medicaid Other

## 2022-07-01 DIAGNOSIS — J189 Pneumonia, unspecified organism: Secondary | ICD-10-CM | POA: Diagnosis not present

## 2022-07-01 LAB — GLUCOSE, CAPILLARY
Glucose-Capillary: 100 mg/dL — ABNORMAL HIGH (ref 70–99)
Glucose-Capillary: 105 mg/dL — ABNORMAL HIGH (ref 70–99)
Glucose-Capillary: 107 mg/dL — ABNORMAL HIGH (ref 70–99)
Glucose-Capillary: 109 mg/dL — ABNORMAL HIGH (ref 70–99)
Glucose-Capillary: 93 mg/dL (ref 70–99)
Glucose-Capillary: 95 mg/dL (ref 70–99)
Glucose-Capillary: 97 mg/dL (ref 70–99)

## 2022-07-01 LAB — CBC WITH DIFFERENTIAL/PLATELET
Abs Immature Granulocytes: 0.08 10*3/uL — ABNORMAL HIGH (ref 0.00–0.07)
Basophils Absolute: 0.1 10*3/uL (ref 0.0–0.1)
Basophils Relative: 1 %
Eosinophils Absolute: 0.4 10*3/uL (ref 0.0–0.5)
Eosinophils Relative: 3 %
HCT: 19.3 % — ABNORMAL LOW (ref 39.0–52.0)
Hemoglobin: 6.1 g/dL — CL (ref 13.0–17.0)
Immature Granulocytes: 1 %
Lymphocytes Relative: 16 %
Lymphs Abs: 2.2 10*3/uL (ref 0.7–4.0)
MCH: 27.2 pg (ref 26.0–34.0)
MCHC: 31.6 g/dL (ref 30.0–36.0)
MCV: 86.2 fL (ref 80.0–100.0)
Monocytes Absolute: 1.3 10*3/uL — ABNORMAL HIGH (ref 0.1–1.0)
Monocytes Relative: 10 %
Neutro Abs: 10 10*3/uL — ABNORMAL HIGH (ref 1.7–7.7)
Neutrophils Relative %: 69 %
Platelets: 594 10*3/uL — ABNORMAL HIGH (ref 150–400)
RBC: 2.24 MIL/uL — ABNORMAL LOW (ref 4.22–5.81)
RDW: 15.9 % — ABNORMAL HIGH (ref 11.5–15.5)
WBC: 14.1 10*3/uL — ABNORMAL HIGH (ref 4.0–10.5)
nRBC: 0 % (ref 0.0–0.2)

## 2022-07-01 LAB — HEMOGLOBIN AND HEMATOCRIT, BLOOD
HCT: 20.3 % — ABNORMAL LOW (ref 39.0–52.0)
Hemoglobin: 6.5 g/dL — CL (ref 13.0–17.0)

## 2022-07-01 LAB — COMPREHENSIVE METABOLIC PANEL
ALT: 36 U/L (ref 0–44)
AST: 58 U/L — ABNORMAL HIGH (ref 15–41)
Albumin: <1.5 g/dL — ABNORMAL LOW (ref 3.5–5.0)
Alkaline Phosphatase: 122 U/L (ref 38–126)
Anion gap: 16 — ABNORMAL HIGH (ref 5–15)
BUN: 55 mg/dL — ABNORMAL HIGH (ref 6–20)
CO2: 25 mmol/L (ref 22–32)
Calcium: 8.6 mg/dL — ABNORMAL LOW (ref 8.9–10.3)
Chloride: 94 mmol/L — ABNORMAL LOW (ref 98–111)
Creatinine, Ser: 3.36 mg/dL — ABNORMAL HIGH (ref 0.61–1.24)
GFR, Estimated: 20 mL/min — ABNORMAL LOW (ref >60)
Glucose, Bld: 97 mg/dL (ref 70–99)
Potassium: 4.9 mmol/L (ref 3.5–5.1)
Sodium: 135 mmol/L (ref 135–145)
Total Bilirubin: 0.3 mg/dL (ref 0.3–1.2)
Total Protein: 6.3 g/dL — ABNORMAL LOW (ref 6.5–8.1)

## 2022-07-01 LAB — SEDIMENTATION RATE: Sed Rate: >140 mm/hr — ABNORMAL HIGH (ref 0–16)

## 2022-07-01 LAB — PREPARE RBC (CROSSMATCH)

## 2022-07-01 LAB — MAGNESIUM: Magnesium: 2.2 mg/dL (ref 1.7–2.4)

## 2022-07-01 LAB — C-REACTIVE PROTEIN: CRP: 32.4 mg/dL — ABNORMAL HIGH (ref <1.0)

## 2022-07-01 LAB — PHOSPHORUS: Phosphorus: 3.7 mg/dL (ref 2.5–4.6)

## 2022-07-01 MED ORDER — SODIUM CHLORIDE 0.9% IV SOLUTION: Freq: Once | INTRAVENOUS | Status: AC

## 2022-07-01 MED ORDER — SODIUM CHLORIDE 0.9% IV SOLUTION: Freq: Once | INTRAVENOUS | Status: DC

## 2022-07-01 MED ORDER — DIPHENHYDRAMINE HCL 12.5 MG/5ML PO ELIX
25.0000 mg | ORAL_SOLUTION | Freq: Once | ORAL | Status: AC
  Administered 2022-07-01: 25 mg
  Filled 2022-07-01: qty 10

## 2022-07-01 MED ORDER — DIPHENHYDRAMINE HCL 25 MG PO CAPS
25.0000 mg | ORAL_CAPSULE | Freq: Once | ORAL | Status: DC
  Filled 2022-07-01: qty 1

## 2022-07-01 MED ORDER — CHLORHEXIDINE GLUCONATE CLOTH 2 % EX PADS
6.0000 | MEDICATED_PAD | Freq: Every day | CUTANEOUS | Status: DC
  Administered 2022-07-02 – 2022-07-07 (×6): 6 via TOPICAL

## 2022-07-01 MED ORDER — HEPARIN SODIUM (PORCINE) 5000 UNIT/ML IJ SOLN
5000.0000 [IU] | Freq: Three times a day (TID) | INTRAMUSCULAR | Status: DC
  Administered 2022-07-03 – 2022-07-08 (×14): 5000 [IU] via SUBCUTANEOUS
  Filled 2022-07-01 (×14): qty 1

## 2022-07-01 MED ORDER — ACETAMINOPHEN 325 MG PO TABS
650.0000 mg | ORAL_TABLET | Freq: Once | ORAL | Status: AC
  Administered 2022-07-01: 650 mg via ORAL

## 2022-07-01 MED ORDER — METRONIDAZOLE 500 MG PO TABS
500.0000 mg | ORAL_TABLET | Freq: Two times a day (BID) | ORAL | Status: AC
  Administered 2022-07-01 – 2022-07-06 (×11): 500 mg
  Filled 2022-07-01 (×10): qty 1

## 2022-07-01 NOTE — Progress Notes (Signed)
PROGRESS NOTE    Cody Macdonald  HYI:[id number] DOB: 01-08-1961 DOA: 06/08/2022 PCP: Pcp, No  Chief Complaint  Patient presents with   Shortness of Breath    Brief Narrative:  61 year old male with CVA, HTN, ESRD on HD MWF, sent from his SNF due to shortness of breath and hypertensive urgency.  He was hypoxic requiring up to 10 L nasal cannula and had pulmonary edema on the chest x-ray.  He was initially in the ICU, underwent emergent dialysis and required Cleviprex infusion for hypertensive urgency.  With improvement he was transferred to the hospitalist service on 11/10.  Hospital course complicated by poor mentation, persistent encephalopathy and failed Cody Macdonald swallow eval.  Palliative care was consulted and multiple GOC were held, he is now DNR and family wishes for PEG tube which should be placed later this week per IR, Plavix now on hold.  Hold aspirin as well   Assessment & Plan:   Principal Problem:   HCAP (healthcare-associated pneumonia) Active Problems:   ESRD (end stage renal disease) ([place])   Hypertensive urgency   Influenza Cody Macdonald   Acute respiratory failure with hypoxia (HCC)   Protein-calorie malnutrition, severe  Acute hypoxic respiratory failure due to multifocal pneumonia due to influenza Cody Macdonald as well as superimposed acute pulmonary edema in the setting of fluid overload in Cody Macdonald dialysis patient-respiratory status overall improving, currently he is on room air.  He completed Cody Macdonald course of Tamiflu as well as empiric antibiotics, finished Cody Macdonald 10-day course on 11/17.   Fever CT C/[name]/P with mild LLL airspace opacity (pneumonia vs atelectasis) Had stable lucent lesions in the right iliac bone (if concern for metastatic disease, consider bone scan) Follow RVP, covid Continue cefepime and vancomycin.  Add flagyl. Cultures 11/26 NGTDx4 Suspect fevers related to his pressure wounds.  Possibly the ones to his L foot vs his sacrum.  MRI pending.  Hypertensive Urgency -patient required ICU level  of care for Cody Macdonald Cleviprex drip --- currently on labetalol, hydralazine with improvement in his blood pressure.  His blood pressures have remained stable   ESRD on HD-management as per nephrology.   Hypophosphatemia, hypokalemia and hypomagnesemia, hyperkalemia-per renal   Poor p.o. intake -Has been refusing to eat and not really eating very much.  PEG at some point.   H/o CVA -On Aspirin and Clopidogrel, on dysphagia 1 diet but not eating very well.  Plavix and aspirin being held per IR.   Anemia of Chronic Kidney Disease  Hb gradually downtrending, chronic disease, iron def, follow S/p 1 unit pRBC due to Hb <7 this AM Another unit pending due to post transfusion H&H remaining less than 7   Hypoalbuminemia -noted   Acute Metabolic Encephalopathy-overall improved but still does not engage much.   Abnormal LFTs -minimal, improving   Underweight/ Severe Malnutrition in the Context of Chronic Illness/cachexia -on tube feeds   Pressure Ulcers, not present on admission See 11/28 note, reevaluation of wounds to sacrum L foot and L heel.  Has large foul smelling unstageable wounds on the L buttock close to anus, L ischium, coccyx.  7 days of dakins.  Stage 2 to R sacrum.  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  New DTPI to LL leg.  Planning for iodine.  Prevalon boot.  Right iliac crest has DTPI.  Standard size bed with air mattress.  Due to rapid decline, they're recommending discussions regarding comfort measures.  Will reengage with palliative care when possible.  Currently working him up for infection.  DVT prophylaxis: heparin Code Status: dnr Family Communication: none at bedside - no answer from son Disposition:   Status is: Inpatient Remains inpatient appropriate because: continued need for inpatient care   Consultants:  Renal  Palliative IR  Procedures:  none  Antimicrobials:  Anti-infectives (From admission, onward)    Start     Dose/Rate Route Frequency Ordered Stop    07/02/22 0000  ceFAZolin (ANCEF) IVPB 2g/100 mL premix        2 g 200 mL/hr over 30 Minutes Intravenous To Radiology 06/28/22 1653 07/03/22 0000   07/01/22 2200  metroNIDAZOLE (FLAGYL) tablet 500 mg        500 mg Per Tube Every 8 hours 07/01/22 1727     06/30/22 2000  ceFEPIme (MAXIPIME) 1 g in sodium chloride 0.9 % 100 mL IVPB        1 g 200 mL/hr over 30 Minutes Intravenous Every 24 hours 06/29/22 0517     06/30/22 1200  vancomycin (VANCOREADY) IVPB 500 mg/100 mL        500 mg 100 mL/hr over 60 Minutes Intravenous Every M-W-F (Hemodialysis) 06/29/22 0517     06/29/22 0615  vancomycin (VANCOCIN) IVPB 1000 mg/200 mL premix        1,000 mg 200 mL/hr over 60 Minutes Intravenous  Once 06/29/22 0517 06/29/22 0712   06/29/22 0615  ceFEPIme (MAXIPIME) 2 g in sodium chloride 0.9 % 100 mL IVPB        2 g 200 mL/hr over 30 Minutes Intravenous  Once 06/29/22 0517 06/29/22 0614   06/27/22 1200  ampicillin-sulbactam (UNASYN) 1.5 g in sodium chloride 0.9 % 100 mL IVPB  Status:  Discontinued        1.5 g 200 mL/hr over 30 Minutes Intravenous Every 12 hours 06/27/22 1101 06/29/22 0501   06/15/22 0900  ceFEPIme (MAXIPIME) 2 g in sodium chloride 0.9 % 100 mL IVPB        2 g 200 mL/hr over 30 Minutes Intravenous  Once 06/15/22 0804 06/15/22 1044   06/14/22 1200  ceFEPIme (MAXIPIME) 2 g in sodium chloride 0.9 % 100 mL IVPB  Status:  Discontinued        2 g 200 mL/hr over 30 Minutes Intravenous Every M-W-F (Hemodialysis) 06/14/22 1008 06/18/22 1429   06/14/22 1200  vancomycin (VANCOREADY) IVPB 500 mg/100 mL  Status:  Discontinued        500 mg 100 mL/hr over 60 Minutes Intravenous Every M-W-F (Hemodialysis) 06/14/22 1008 06/18/22 1429   06/14/22 1100  vancomycin (VANCOCIN) IVPB 1000 mg/200 mL premix        1,000 mg 200 mL/hr over 60 Minutes Intravenous  Once 06/14/22 1000 06/14/22 1600   06/14/22 1100  ceFEPIme (MAXIPIME) 2 g in sodium chloride 0.9 % 100 mL IVPB        2 g 200 mL/hr over 30 Minutes  Intravenous  Once 06/14/22 1000 06/14/22 1259   06/12/22 2000  oseltamivir (TAMIFLU) capsule 30 mg        30 mg Oral  Once 06/12/22 1440 06/12/22 2149   06/10/22 2100  oseltamivir (TAMIFLU) capsule 30 mg        30 mg Oral  Once 06/10/22 0825 06/11/22 0517   06/10/22 1030  cefTRIAXone (ROCEPHIN) 2 g in sodium chloride 0.9 % 100 mL IVPB  Status:  Discontinued        2 g 200 mL/hr over 30 Minutes Intravenous Every 24 hours 06/10/22 0945 06/14/22 1000   06/09/22 2200  ceFEPIme (MAXIPIME) 1 g in sodium chloride 0.9 % 100 mL IVPB  Status:  Discontinued        1 g 200 mL/hr over 30 Minutes Intravenous Every 24 hours 06/08/22 1611 06/10/22 0945   06/09/22 1015  oseltamivir (TAMIFLU) capsule 30 mg        30 mg Oral  Once 06/09/22 0920 06/09/22 1045   06/08/22 1830  oseltamivir (TAMIFLU) capsule 30 mg  Status:  Discontinued        30 mg Oral  Once 06/08/22 1818 06/09/22 0920   06/08/22 1612  vancomycin variable dose per unstable renal function (pharmacist dosing)  Status:  Discontinued         Does not apply See admin instructions 06/08/22 1612 06/09/22 0920   06/08/22 1100  vancomycin (VANCOCIN) IVPB 1000 mg/200 mL premix        1,000 mg 200 mL/hr over 60 Minutes Intravenous  Once 06/08/22 1057 06/08/22 1329   06/08/22 1100  ceFEPIme (MAXIPIME) 2 g in sodium chloride 0.9 % 100 mL IVPB        2 g 200 mL/hr over 30 Minutes Intravenous  Once 06/08/22 1057 06/08/22 1225       Subjective: No new complaints Long discussion with son and his wife about his fevers and wounds  Objective: Vitals:   07/01/22 1050 07/01/22 1124 07/01/22 1141 07/01/22 1438  BP: (!) 150/70 (!) 151/71 (!) 151/67 130/68  Pulse: (!) 105 (!) 110 (!) 109 97  Resp: (!) 25 15 16 20   Temp: 98.3 F (36.8 C) 98.2 F (36.8 C) 98.3 F (36.8 C) 98.3 F (36.8 C)  TempSrc: Oral Oral Oral Oral  SpO2: 92% 95% 95% 99%  Weight:      Height:        Intake/Output Summary (Last 24 hours) at 07/01/2022 1729 Last data filed at  07/01/2022 1438 Gross per 24 hour  Intake 1956 ml  Output --  Net 1956 ml   Filed Weights   06/28/22 0355 06/29/22 0010 06/30/22 0431  Weight: 48.9 kg 47.9 kg 35.4 kg    Examination:  General: No acute distress. Cardiovascular: RRR Lungs: unlabored Abdomen: Soft, nontender, nondistended Neurological: Alert.  Mostly lays in one position Skin: left foot with pressure injuries to bony prominences, unstageable pressure injuries to sacrum Extremities: No edema. Contractures.  Data Reviewed: I have personally reviewed following labs and imaging studies  CBC: Recent Labs  Lab 06/25/22 0029 06/27/22 0802 06/29/22 0049 06/30/22 0051 06/30/22 1821 07/01/22 0112 07/01/22 1629  WBC 8.6 10.4 12.9* 13.7*  --  14.1*  --   NEUTROABS  --   --   --   --   --  10.0*  --   HGB 7.6* 7.1* 7.0* 7.0* 7.2* 6.1* 6.5*  HCT 23.3* 22.0* 22.6* 21.8* 22.6* 19.3* 20.3*  MCV 86.3 85.6 86.6 83.8  --  86.2  --   PLT 329 395 551* 610*  --  594*  --     Basic Metabolic Panel: Recent Labs  Lab 06/25/22 0029 06/27/22 0802 06/29/22 0049 06/30/22 0051 07/01/22 0112  NA 135 134* 136 130* 135  K 5.4* 6.6* 5.5* 5.8* 4.9  CL 94* 92* 96* 92* 94*  CO2 29 28 26 23 25   GLUCOSE 122* 91 104* 96 97  BUN 84* 77* 66* 90* 55*  CREATININE 5.70* 5.03* 4.04* 5.00* 3.36*  CALCIUM 8.9 8.6* 8.5* 8.1* 8.6*  MG 2.4  --   --  2.6* 2.2  PHOS  --   --   --   --  3.7    GFR: Estimated Creatinine Clearance: 11.7 mL/min (Story Conti) (by C-G formula based on SCr of 3.36 mg/dL (H)).  Liver Function Tests: Recent Labs  Lab 06/27/22 0802 06/30/22 0051 07/01/22 0112  AST 44* 62* 58*  ALT 24 39 36  ALKPHOS 83 117 122  BILITOT 0.4 0.5 0.3  PROT 6.2* 6.4* 6.3*  ALBUMIN 1.6* <1.5* <1.5*    CBG: Recent Labs  Lab 07/01/22 0006 07/01/22 0413 07/01/22 0840 07/01/22 1046 07/01/22 1605  GLUCAP 100* 107* 109* 93 97     Recent Results (from the past 240 hour(s))  Culture, blood (Routine X 2) w Reflex to ID Panel      Status: None (Preliminary result)   Collection Time: 06/27/22  7:51 AM   Specimen: BLOOD  Result Value Ref Range Status   Specimen Description BLOOD RIGHT ANTECUBITAL  Final   Special Requests   Final    BOTTLES DRAWN AEROBIC AND ANAEROBIC Blood Culture results may not be optimal due to an inadequate volume of blood received in culture bottles   Culture   Final    NO GROWTH 4 DAYS Performed at [facility], [place] [address] [place], [place] [address]    Report Status PENDING  Incomplete  Culture, blood (Routine X 2) w Reflex to ID Panel     Status: None (Preliminary result)   Collection Time: 06/27/22  8:02 AM   Specimen: BLOOD RIGHT FOREARM  Result Value Ref Range Status   Specimen Description BLOOD RIGHT FOREARM  Final   Special Requests   Final    BOTTLES DRAWN AEROBIC ONLY Blood Culture results may not be optimal due to an inadequate volume of blood received in culture bottles   Culture   Final    NO GROWTH 4 DAYS Performed at [facility], [place] [address], [place],  [address]    Report Status PENDING  Incomplete  Respiratory (~20 pathogens) panel by PCR     Status: None   Collection Time: 06/30/22  8:10 AM   Specimen: Nasopharyngeal Swab; Respiratory  Result Value Ref Range Status   Adenovirus NOT DETECTED NOT DETECTED Final   Coronavirus 229E NOT DETECTED NOT DETECTED Final    Comment: (NOTE) The Coronavirus on the Respiratory Panel, DOES NOT test for the novel  Coronavirus (2019 nCoV)    Coronavirus HKU1 NOT DETECTED NOT DETECTED Final   Coronavirus NL63 NOT DETECTED NOT DETECTED Final   Coronavirus OC43 NOT DETECTED NOT DETECTED Final   Metapneumovirus NOT DETECTED NOT DETECTED Final   Rhinovirus / Enterovirus NOT DETECTED NOT DETECTED Final   Influenza Layan Zalenski NOT DETECTED NOT DETECTED Final   Influenza B NOT DETECTED NOT DETECTED Final   Parainfluenza Virus 1 NOT DETECTED NOT DETECTED Final   Parainfluenza Virus 2 NOT DETECTED NOT DETECTED Final    Parainfluenza Virus 3 NOT DETECTED NOT DETECTED Final   Parainfluenza Virus 4 NOT DETECTED NOT DETECTED Final   Respiratory Syncytial Virus NOT DETECTED NOT DETECTED Final   Bordetella pertussis NOT DETECTED NOT DETECTED Final   Bordetella Parapertussis NOT DETECTED NOT DETECTED Final   Chlamydophila pneumoniae NOT DETECTED NOT DETECTED Final   Mycoplasma pneumoniae NOT DETECTED NOT DETECTED Final    Comment: Performed at [facility], [place]. [address], [place],  [address]  SARS Coronavirus 2 by RT PCR (hospital order, performed in [facility] hospital lab) *cepheid single result test* Anterior Nasal Swab     Status: None   Collection Time:  06/30/22  8:10 AM   Specimen: Anterior Nasal Swab  Result Value Ref Range Status   SARS Coronavirus 2 by RT PCR NEGATIVE NEGATIVE Final    Comment: (NOTE) SARS-CoV-2 target nucleic acids are NOT DETECTED.  The SARS-CoV-2 RNA is generally detectable in upper and lower respiratory specimens during the acute phase of infection. The lowest concentration of SARS-CoV-2 viral copies this assay can detect is 250 copies / mL. Areya Lemmerman negative result does not preclude SARS-CoV-2 infection and should not be used as the sole basis for treatment or other patient management decisions.  Curley Hogen negative result may occur with improper specimen collection / handling, submission of specimen other than nasopharyngeal swab, presence of viral mutation(s) within the areas targeted by this assay, and inadequate number of viral copies (<250 copies / mL). Alton Bouknight negative result must be combined with clinical observations, patient history, and epidemiological information.  Fact Sheet for Patients:   https://www.patel.info/  Fact Sheet for Healthcare Providers: https://hall.com/  This test is not yet approved or  cleared by the [place] FDA and has been authorized for detection and/or diagnosis of SARS-CoV-2 by FDA under an  Emergency Use Authorization (EUA).  This EUA will remain in effect (meaning this test can be used) for the duration of the COVID-19 declaration under Section 564(b)(1) of the Act, 21 U.S.C. section 360bbb-3(b)(1), unless the authorization is terminated or revoked sooner.  Performed at [facility], [place] [address], [place], [place] [address]          Radiology Studies: CT FOOT LEFT WO CONTRAST  Result Date: 07/01/2022 CLINICAL DATA:  Foot wounds EXAM: CT OF THE LEFT FOOT WITHOUT CONTRAST TECHNIQUE: Multidetector CT imaging of the left foot was performed according to the standard protocol. Multiplanar CT image reconstructions were also generated. RADIATION DOSE REDUCTION: This exam was performed according to the departmental dose-optimization program which includes automated exposure control, adjustment of the mA and/or kV according to patient size and/or use of iterative reconstruction technique. COMPARISON:  None Available. FINDINGS: Bones/Joint/Cartilage Osseous structures appear diffusely demineralized. No evidence of fracture or dislocation. Mild joint space narrowing is most notable at the first MTP joint. No focal bone erosion or periosteal elevation. Ligaments Suboptimally assessed by CT. Muscles and Tendons Atrophic appearance of the musculature. No appreciable tendon abnormality by CT. Soft tissues Lack of subcutaneous fat. Possible wound in the region of the left heel and medial malleolus. Generalized subcutaneous edema. No organized fluid collection. No soft tissue gas. IMPRESSION: 1. Possible wound in the region of the left heel and medial malleolus. Generalized subcutaneous edema without organized fluid collection or soft tissue gas. 2. No acute osseous abnormality.  No CT evidence of osteomyelitis. Electronically Signed   By: Davina Poke D.O.   On: 07/01/2022 11:16        Scheduled Meds:  sodium chloride   Intravenous Once   Chlorhexidine Gluconate Cloth  6 each  Topical Q0600   Chlorhexidine Gluconate Cloth  6 each Topical Q0600   [START ON 07/02/2022] Chlorhexidine Gluconate Cloth  6 each Topical Q0600   darbepoetin (ARANESP) injection - DIALYSIS  150 mcg Subcutaneous Q Thu-1800   diclofenac Sodium  4 g Topical Once   famotidine  20 mg Per Tube Daily   feeding supplement (PROSource TF20)  60 mL Per Tube Daily   [START ON 07/03/2022] heparin  5,000 Units Subcutaneous Q8H   leptospermum manuka honey  1 Application Topical Daily   metroNIDAZOLE  500 mg Per Tube Q8H  multivitamin  1 tablet Per Tube QHS   nutrition supplement (JUVEN)  1 packet Per Tube BID BM   mouth rinse  15 mL Mouth Rinse 4 times per day   sodium hypochlorite   Topical BID   thiamine (VITAMIN B1) injection  100 mg Intravenous Daily   Continuous Infusions:  [START ON 07/02/2022]  ceFAZolin (ANCEF) IV     ceFEPime (MAXIPIME) IV Stopped (06/30/22 2350)   feeding supplement (NEPRO CARB STEADY) 40 mL/hr at 06/30/22 2056   vancomycin 500 mg (06/30/22 1219)     LOS: 23 days    Time spent: over 30 min    Fayrene Helper, MD [facility]   To contact the attending provider between 7A-7P or the covering provider during after hours 7P-7A, please log into the web site [email or web address] and access using universal [place] password for that web site. If you do not have the password, please call the hospital operator.  07/01/2022, 5:29 PM

## 2022-07-01 NOTE — Progress Notes (Signed)
Patient displays no s.s of distress or deterioration. Temp elevated, source of infection currently unknown. Several scans and labs obtained to indicate such. RR-RN made aware as well as floor coverage. Tylenol administered for fever/pain. Need to give 1 unit of blood but with fever cannot. Will continue to monitor.   07/01/22 1944  Assess: MEWS Score  Temp (!) 101.5 F (38.6 C)  BP (!) 140/64  MAP (mmHg) 87  Pulse Rate (!) 116  ECG Heart Rate (!) 118  Resp 15  SpO2 96 %  O2 Device Room Air  Patient Activity (if Appropriate) In bed  Assess: MEWS Score  MEWS Temp 2  MEWS Systolic 0  MEWS Pulse 2  MEWS RR 0  MEWS LOC 0  MEWS Score 4  MEWS Score Color Red  Assess: if the MEWS score is Yellow or Red  Were vital signs taken at a resting state? Yes  Focused Assessment No change from prior assessment  Does the patient meet 2 or more of the SIRS criteria? Yes  Does the patient have a confirmed or suspected source of infection? Yes  Provider and Rapid Response Notified? No  MEWS guidelines implemented *See Row Information* Yes  Treat  MEWS Interventions Administered prn meds/treatments  Pain Scale 0-10  Pain Score 0  Take Vital Signs  Increase Vital Sign Frequency  Red: Q 1hr X 4 then Q 4hr X 4, if remains red, continue Q 4hrs  Escalate  MEWS: Escalate Red: discuss with charge nurse/RN and provider, consider discussing with RRT  Notify: Charge Nurse/RN  Name of Charge Nurse/RN Notified Matthew, RN  Date Charge Nurse/RN Notified 07/01/22  Time Charge Nurse/RN Notified 1952  Provider Notification  Provider Name/Title J.Howerter MD  Date Provider Notified 07/01/22  Time Provider Notified 2000  Method of Notification Page  Notification Reason Change in status  Provider response Other (Comment) (awaiting response/orders)  Notify: Rapid Response  Name of Rapid Response RN Notified Eric RR-RN  Date Rapid Response Notified 07/01/22  Time Rapid Response Notified 2005  Document   Patient Outcome Stabilized after interventions  Progress note created (see row info) Yes  Assess: SIRS CRITERIA  SIRS Temperature  1  SIRS Pulse 1  SIRS Respirations  0  SIRS WBC 1  SIRS Score Sum  3

## 2022-07-01 NOTE — Progress Notes (Signed)
[facility] Progress Note   Subjective:   Patient seen and examined at bedside.  Reports he feels "bad."  Does not provide any additional details.     Objective Vitals:   07/01/22 0709 07/01/22 1050 07/01/22 1124 07/01/22 1141  BP: (!) 118/52 (!) 150/70 (!) 151/71 (!) 151/67  Pulse: 95 (!) 105 (!) 110 (!) 109  Resp: 19 (!) 25 15 16   Temp: 99.1 F (37.3 C) 98.3 F (36.8 C) 98.2 F (36.8 C) 98.3 F (36.8 C)  TempSrc: Oral Oral Oral Oral  SpO2: 95% 92% 95% 95%  Weight:      Height:       Physical Exam General:chronically ill appearing, cachetic male in NAD Heart:+tachycardia Lungs:CTA anteriorly Abdomen:soft, NTND Extremities:no LE edema Dialysis Access: LU AVG +b/t, [facility]   Filed Weights   06/28/22 0355 06/29/22 0010 06/30/22 0431  Weight: 48.9 kg 47.9 kg 35.4 kg    Intake/Output Summary (Last 24 hours) at 07/01/2022 1249 Last data filed at 07/01/2022 0945 Gross per 24 hour  Intake 1372 ml  Output 1000 ml  Net 372 ml    Additional Objective Labs: Basic Metabolic Panel: Recent Labs  Lab 06/29/22 0049 06/30/22 0051 07/01/22 0112  NA 136 130* 135  K 5.5* 5.8* 4.9  CL 96* 92* 94*  CO2 26 23 25   GLUCOSE 104* 96 97  BUN 66* 90* 55*  CREATININE 4.04* 5.00* 3.36*  CALCIUM 8.5* 8.1* 8.6*  PHOS  --   --  3.7   Liver Function Tests: Recent Labs  Lab 06/27/22 0802 06/30/22 0051 07/01/22 0112  AST 44* 62* 58*  ALT 24 39 36  ALKPHOS 83 117 122  BILITOT 0.4 0.5 0.3  PROT 6.2* 6.4* 6.3*  ALBUMIN 1.6* <1.5* <1.5*   CBC: Recent Labs  Lab 06/25/22 0029 06/27/22 0802 06/29/22 0049 06/30/22 0051 06/30/22 1821 07/01/22 0112  WBC 8.6 10.4 12.9* 13.7*  --  14.1*  NEUTROABS  --   --   --   --   --  10.0*  HGB 7.6* 7.1* 7.0* 7.0* 7.2* 6.1*  HCT 23.3* 22.0* 22.6* 21.8* 22.6* 19.3*  MCV 86.3 85.6 86.6 83.8  --  86.2  PLT 329 395 551* 610*  --  594*   Blood Culture    Component Value Date/Time   SDES BLOOD RIGHT FOREARM 06/27/2022 0802    SPECREQUEST  06/27/2022 0802    BOTTLES DRAWN AEROBIC ONLY Blood Culture results may not be optimal due to an inadequate volume of blood received in culture bottles   CULT  06/27/2022 0802    NO GROWTH 4 DAYS Performed at [facility], [place] [address] [place], [place] [address]    REPTSTATUS PENDING 06/27/2022 0802   CBG: Recent Labs  Lab 06/30/22 2012 07/01/22 0006 07/01/22 0413 07/01/22 0840 07/01/22 1046  GLUCAP 104* 100* 107* 109* 93    Studies/Results: CT FOOT LEFT WO CONTRAST  Result Date: 07/01/2022 CLINICAL DATA:  Foot wounds EXAM: CT OF THE LEFT FOOT WITHOUT CONTRAST TECHNIQUE: Multidetector CT imaging of the left foot was performed according to the standard protocol. Multiplanar CT image reconstructions were also generated. RADIATION DOSE REDUCTION: This exam was performed according to the departmental dose-optimization program which includes automated exposure control, adjustment of the mA and/or kV according to patient size and/or use of iterative reconstruction technique. COMPARISON:  None Available. FINDINGS: Bones/Joint/Cartilage Osseous structures appear diffusely demineralized. No evidence of fracture or dislocation. Mild joint space narrowing is most notable at  the first MTP joint. No focal bone erosion or periosteal elevation. Ligaments Suboptimally assessed by CT. Muscles and Tendons Atrophic appearance of the musculature. No appreciable tendon abnormality by CT. Soft tissues Lack of subcutaneous fat. Possible wound in the region of the left heel and medial malleolus. Generalized subcutaneous edema. No organized fluid collection. No soft tissue gas. IMPRESSION: 1. Possible wound in the region of the left heel and medial malleolus. Generalized subcutaneous edema without organized fluid collection or soft tissue gas. 2. No acute osseous abnormality.  No CT evidence of osteomyelitis. Electronically Signed   By: Davina Poke D.O.   On: 07/01/2022 11:16   CT CHEST  ABDOMEN PELVIS W CONTRAST  Result Date: 06/29/2022 CLINICAL DATA:  Sepsis. EXAM: CT CHEST, ABDOMEN, AND PELVIS WITH CONTRAST TECHNIQUE: Multidetector CT imaging of the chest, abdomen and pelvis was performed following the standard protocol during bolus administration of intravenous contrast. RADIATION DOSE REDUCTION: This exam was performed according to the departmental dose-optimization program which includes automated exposure control, adjustment of the mA and/or kV according to patient size and/or use of iterative reconstruction technique. CONTRAST:  57mL OMNIPAQUE IOHEXOL 350 MG/ML SOLN COMPARISON:  May 26, 2022. FINDINGS: CT CHEST FINDINGS Cardiovascular: No significant vascular findings. Normal heart size. No pericardial effusion. Mediastinum/Nodes: No enlarged mediastinal, hilar, or axillary lymph nodes. Thyroid gland, trachea, and esophagus demonstrate no significant findings. Lungs/Pleura: No pneumothorax or pleural effusion is noted. Left lower lobe airspace opacity is noted concerning for infiltrate or atelectasis. 4 mm nodule is noted in right lung apex best seen on image number 41 of series 4. Minimal right basilar subsegmental atelectasis is noted. Musculoskeletal: No chest wall mass or suspicious bone lesions identified. CT ABDOMEN PELVIS FINDINGS Hepatobiliary: No focal liver abnormality is seen. No gallstones, gallbladder wall thickening, or biliary dilatation. Pancreas: Unremarkable. No pancreatic ductal dilatation or surrounding inflammatory changes. Spleen: Normal in size without focal abnormality. Adrenals/Urinary Tract: Adrenal glands are unremarkable. Kidneys are normal, without renal calculi, focal lesion, or hydronephrosis. Bladder is unremarkable. Stomach/Bowel: Distal tip of feeding tube is seen in distal stomach. The appendix is unremarkable. Residual stool and contrast is noted throughout the nondilated colon. There is no evidence of bowel obstruction. Vascular/Lymphatic: Aortic  atherosclerosis. No enlarged abdominal or pelvic lymph nodes. 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 Reproductive: Prostate gland not well visualized. Other: No abdominal wall hernia or abnormality. No abdominopelvic ascites. Musculoskeletal: Stable lucent lesions are noted in the right iliac bones which may be benign, but metastatic disease cannot be excluded. IMPRESSION: Mild left lower lobe airspace opacity is noted concerning for pneumonia or atelectasis. Minimal right basilar subsegmental atelectasis is noted. 4 mm nodule is noted in right lung apex. No follow-up needed if patient is low-risk.This recommendation follows the consensus statement: Guidelines for Management of Incidental Pulmonary Nodules Detected on CT Images: From the Fleischner Society 2017; Radiology 2017; 284:228-243. Stable lucent lesions are noted in the right iliac bones which may be benign, but metastatic disease cannot be excluded. If there is clinical concern for metastatic disease, bone scan can be performed further evaluation. No other significant abnormality seen in the abdomen or pelvis. Aortic Atherosclerosis (ICD10-I70.0). Electronically Signed   By: Marijo Conception M.D.   On: 06/29/2022 13:40    Medications:  [START ON 07/02/2022]  ceFAZolin (ANCEF) IV     ceFEPime (MAXIPIME) IV Stopped (06/30/22 2350)   feeding supplement (NEPRO CARB STEADY) 40 mL/hr at 06/30/22 2056  vancomycin 500 mg (06/30/22 1219)    sodium chloride   Intravenous Once   Chlorhexidine Gluconate Cloth  6 each Topical Q0600   Chlorhexidine Gluconate Cloth  6 each Topical Q0600   darbepoetin (ARANESP) injection - DIALYSIS  150 mcg Subcutaneous Q Thu-1800   diclofenac Sodium  4 g Topical Once   famotidine  20 mg Per Tube Daily   feeding supplement (PROSource TF20)  60 mL Per Tube Daily   [START ON 07/03/2022] heparin  5,000 Units Subcutaneous Q8H   leptospermum manuka honey  1 Application  Topical Daily   multivitamin  1 tablet Per Tube QHS   nutrition supplement (JUVEN)  1 packet Per Tube BID BM   mouth rinse  15 mL Mouth Rinse 4 times per day   sodium hypochlorite   Topical BID   thiamine (VITAMIN B1) injection  100 mg Intravenous Daily    Dialysis Orders: MWF at [facility] Dr  3.5h  48kg  2.2/5 bath  300/600  Hep none R AVG/TDC (AVG ok to use reportedly)    CXR 11/10- bilat splotchy perihilar disease, edema vs infxn, improved compared to 11/8 film   Assessment/ Plan: 1.  Resolved acute hypoxic respiratory failure: Now stable on RA. Due to combination of pulmonary edema/volume overload with bronchospasm along with influenza +/- and probable HCAP.  Previously improving, CXR 11/27 w/ Probable retrocardiac LLL opacity suspicious for airspace disease/pneumonia. CT w/possible LLL PNA, no other acute abnormalities. ABX started. Per PMD.  2. Fever/leukocytosis - Afebrile x 24hrs. WBC trending up today despite ABX. BC w/NGTD.  CXR 11/27 & CT 11/28 w/possible PNA. Foul smelling pressure wounds. Has TDC which always has possibility of infection but BC neg. AVG working well, requested IR to remove TDC. Per PMD.  3. Altered mental status: per staff at [place] [facility] is not unusual for him not to respond verbally at the OP unit.  4.  ESRD: on HD MWF.  HD tomorrow per regular schedule. K 4.9 5. HD access: Has AVG with successful use.  Requested IR remove TDC when able.  Appreciate their assistance.  6.  HTN/ vol: Blood pressure mostly well controlled. Weights labile likely sig under dry wt. BP's not tolerating further UF. Minimal UF with HD.  7.  Secondary hyperparathyroidism: Holding sevelamer as P < 3.0. Calcium in goal.   8.  Anemia of chronic disease: Hemoglobin dropping, 6.1 today. increase aranesp to 111mcg weekly.  TSAT 12% - iron course started, then held d/t concern for acute infection, will restart when off ABX. 1 unit pRBC ordered for today.  9.  GOC - pt very frail, not  interacting very much verbally. Appreciate palliative assistance. Poor prognosis. 10. Nutrition - on tube feeds - changed to Nepro.  Plan for PEG tube placement by IR on 12/1.    Jen Mow, PA-C [facility] 07/01/2022,12:49 PM  LOS: 23 days

## 2022-07-01 NOTE — Progress Notes (Signed)
Pt's son, [name] called this RN and consent obtained over the phone to administer blood product with a second RN as witness. Blood administration consent placed in pt chart. Type and screen pending at this time.

## 2022-07-01 NOTE — Progress Notes (Addendum)
Called pt's son, [name], to obtain consent to transfuse blood this morning for hgb of 6.1. Pt's son said he was in a deep sleep and will call back. Provided [name] with this RN's direct phone number and can reach me until 7am. Notified MD of the above information.

## 2022-07-01 NOTE — Progress Notes (Signed)
Need additional images on pelvis and foot per radiologist, pt currently receiving blood and unable to come at this time, RN will call when blood is finished to get additional images.

## 2022-07-02 ENCOUNTER — Inpatient Hospital Stay (HOSPITAL_COMMUNITY): Payer: Medicaid Other

## 2022-07-02 DIAGNOSIS — E43 Unspecified severe protein-calorie malnutrition: Secondary | ICD-10-CM | POA: Diagnosis not present

## 2022-07-02 DIAGNOSIS — N186 End stage renal disease: Secondary | ICD-10-CM | POA: Diagnosis not present

## 2022-07-02 DIAGNOSIS — J189 Pneumonia, unspecified organism: Secondary | ICD-10-CM | POA: Diagnosis not present

## 2022-07-02 DIAGNOSIS — Z515 Encounter for palliative care: Secondary | ICD-10-CM | POA: Diagnosis not present

## 2022-07-02 HISTORY — PX: IR REMOVAL TUN CV CATH W/O FL: IMG2289

## 2022-07-02 LAB — BASIC METABOLIC PANEL
Anion gap: 17 — ABNORMAL HIGH (ref 5–15)
BUN: 81 mg/dL — ABNORMAL HIGH (ref 6–20)
CO2: 27 mmol/L (ref 22–32)
Calcium: 8.4 mg/dL — ABNORMAL LOW (ref 8.9–10.3)
Chloride: 91 mmol/L — ABNORMAL LOW (ref 98–111)
Creatinine, Ser: 4.43 mg/dL — ABNORMAL HIGH (ref 0.61–1.24)
GFR, Estimated: 14 mL/min — ABNORMAL LOW (ref >60)
Glucose, Bld: 97 mg/dL (ref 70–99)
Potassium: 4.9 mmol/L (ref 3.5–5.1)
Sodium: 135 mmol/L (ref 135–145)

## 2022-07-02 LAB — GLUCOSE, CAPILLARY
Glucose-Capillary: 80 mg/dL (ref 70–99)
Glucose-Capillary: 77 mg/dL (ref 70–99)
Glucose-Capillary: 101 mg/dL — ABNORMAL HIGH (ref 70–99)
Glucose-Capillary: 81 mg/dL (ref 70–99)
Glucose-Capillary: 89 mg/dL (ref 70–99)

## 2022-07-02 LAB — PROTIME-INR
INR: 1.2 (ref 0.8–1.2)
Prothrombin Time: 15 seconds (ref 11.4–15.2)

## 2022-07-02 LAB — PREPARE RBC (CROSSMATCH)

## 2022-07-02 LAB — CBC
HCT: 19.2 % — ABNORMAL LOW (ref 39.0–52.0)
Hemoglobin: 6.1 g/dL — CL (ref 13.0–17.0)
MCH: 27 pg (ref 26.0–34.0)
MCHC: 31.8 g/dL (ref 30.0–36.0)
MCV: 85 fL (ref 80.0–100.0)
Platelets: 556 10*3/uL — ABNORMAL HIGH (ref 150–400)
RBC: 2.26 MIL/uL — ABNORMAL LOW (ref 4.22–5.81)
RDW: 15.9 % — ABNORMAL HIGH (ref 11.5–15.5)
WBC: 12 10*3/uL — ABNORMAL HIGH (ref 4.0–10.5)
nRBC: 0 % (ref 0.0–0.2)

## 2022-07-02 LAB — CULTURE, BLOOD (ROUTINE X 2)
Culture: NO GROWTH
Culture: NO GROWTH

## 2022-07-02 LAB — HEMOGLOBIN AND HEMATOCRIT, BLOOD
HCT: 22.4 % — ABNORMAL LOW (ref 39.0–52.0)
Hemoglobin: 7.5 g/dL — ABNORMAL LOW (ref 13.0–17.0)

## 2022-07-02 MED ORDER — LIDOCAINE HCL 1 % IJ SOLN
INTRAMUSCULAR | Status: AC
  Filled 2022-07-02: qty 20

## 2022-07-02 MED ORDER — CHLORHEXIDINE GLUCONATE 4 % EX LIQD
CUTANEOUS | Status: AC
  Filled 2022-07-02: qty 15

## 2022-07-02 MED ORDER — CEFAZOLIN SODIUM-DEXTROSE 2-4 GM/100ML-% IV SOLN
2.0000 g | INTRAVENOUS | Status: AC
  Administered 2022-07-02: 2 g via INTRAVENOUS
  Filled 2022-07-02: qty 100

## 2022-07-02 MED ORDER — SODIUM CHLORIDE 0.9% IV SOLUTION: Freq: Once | INTRAVENOUS | Status: AC

## 2022-07-02 NOTE — Progress Notes (Addendum)
TR[facility] night cross cover note:   I was notified by patient's RN of updated hgb level this morning, which is noted to be 6.1. Patient currently asymptomatic, and without overt blood loss at this time.  Additionally, vital signs remain stable in the interval, with most recent systolic blood pressure in the 140s mmHG.  His heart rates remain in the low 100s, where they have consistently been running over the course the last day.   Per my chart review, most recent prior hemoglobin level was 6.5 at 1630 on 07/01/2022, following which the patient received transfusion of 1 unit PRBC.   Prior to that, hemoglobin was 6.1 on the morning of 07/01/2022, which had prompted transfusion of 1 unit of PRBC leading up to hemoglobin level of 6.5, as above.   While he may end up requiring more than 1 unit of additional PRBC transfusion given the nature of his hemoglobin response following most recent 1 unit PRBC, I will start with orders to transfuse 1 additional unit PRBC at this time, noting that he is due for his routine scheduled hemodialysis later today, which will provide additional opportunity for PRBC transfusion. He is without overt evidence of acute respiratory distress/volume overload following previously mentioned transfusion of 2 units PRBC over the course of the last day.   Of note, review of additional AM labs, notable for platelet count of 556, along with non-elevated INR value of 1.2.      Babs Bertin, DO Hospitalist

## 2022-07-02 NOTE — Progress Notes (Signed)
Palliative:   Cody Macdonald is lying quietly in bed.  He appears acutely/chronically ill and frail, contracted.  He is alert, making and somewhat keeping eye contact.  He has somewhat limited interaction with me today.  I believe that he can make his needs known.  There is no family at bedside at this time.  Detail face-to-face conference with bedside tech.  Cody Macdonald and I talk about removal of HD catheter, awaiting PEG tube for fever.  Overall, he has no questions at this time.  He is able to take sips of liquid from a cup.  Conference with attending, bedside nursing staff, transition of care team related to patient condition, needs, goals of care, disposition.  Plan: At this point continue to treat the treatable but no CPR or intubation.  Time for outcomes.  Anticipate return to long-term care facility, [name].  25 minutes  Cody Axe, NP Palliative medicine team Team phone [phone] Greater than 50% of this time was spent counseling and coordinating care related to the above assessment and plan.

## 2022-07-02 NOTE — Progress Notes (Signed)
Responded to consult. Noted infiltration to RUA. IV started in proximal RUA cephalic vein. No other appropriate veins for IV access. Recommend considering central line if further access is needed. RN notified.

## 2022-07-02 NOTE — Procedures (Signed)
PROCEDURE SUMMARY:  Successful removal of tunneled hemodialysis catheter.  Patient tolerated well.  EBL < 5 mL  See full dictation in Imaging for details.  Murrell Redden PA-C 07/02/2022 4:34 PM

## 2022-07-02 NOTE — Progress Notes (Signed)
Due to fevers and anemia, Dr. Florene Glen requesting G-tube placement be put on hold at this time.  [facility] will still be removed today.  Electronically Signed: Pasty Spillers, PA-C 07/02/2022, 9:31 AM

## 2022-07-02 NOTE — Progress Notes (Signed)
Hgb critical at 6.1. IV Team consulted for new site initiation due to infiltration. MD on call made aware of critical Hgb. Awaiting orders. All dressings changed, wounds cleansed, and bath and CHG wipes provided. Patient sleeping comfortably.

## 2022-07-02 NOTE — Progress Notes (Signed)
TRH night cross cover note:   I was notified by RN of patient's fever of 101.5. Other VSS, and patient without any new complaints.   Per my chart review, the patient has been experiencing intermittent objective fevers over the last several days and is currently on broad-spectrum IV antibiotics.   During today's day shift, he was ordered transfusion of 2 units PRBCs.  Per my discussions with the patient's RN this evening, prior to initiation of transfusion of the first unit PRBC, the patient was noted to have an additional objective fever, for which he received prn acetaminophen via his Cortrack, with ensuing improvement in fever noted prior to starting transfusion of 1st unit prbc.   The patient reportedly then remained afebrile throughout the time in which he was receiving transfusion of first unit PRBC, and overall remained afebrile for 5 to 6 hours following the initiation of this transfusion, before ulitmately developing an additional elevated temperature of 101.5 this evening. He has just received his next prn dose of acetaminophen administered via the Cortrack.   These intermittent objective fevers predate his PRBC transfusion and appear to be on the basis of suspected underlying infectious process.  Overall, his fevers do not appear to be transfusion related.  We will proceed with transfusion of second unit PRBC, as ordered.     Babs Bertin, DO Hospitalist

## 2022-07-02 NOTE — Progress Notes (Signed)
[facility] Progress Note   Subjective:    Seen and examined patient at bedside. No family currently is present. Patient with limited interaction; however, was able to "nod" his head with simple questions. He denied SOB and CP. Currently afebrile but still tachycardic. Noted Hgb 6.1 earlier today-s/p 1 unit PRBCs. Plan for [facility] removal by IR today then HD. Will re-check another Hgb pre-HD and will give another PRBCs if Hgb < 7.  Objective Vitals:   07/02/22 0520 07/02/22 0705 07/02/22 0800 07/02/22 1045  BP: (!) 142/64 135/60 132/60 131/67  Pulse: (!) 111 (!) 111 (!) 114 (!) 115  Resp: 10 16 16 18   Temp: 98.3 F (36.8 C) 98.4 F (36.9 C)  98.5 F (36.9 C)  TempSrc: Oral Axillary  Oral  SpO2: 99% 97% 98% 98%  Weight:      Height:       Physical Exam General:chronically ill appearing, contracted and cachetic male in NAD Heart:+tachycardia Lungs:CTA anteriorly Abdomen:soft, NTND Extremities:no LE edema Dialysis Access: LU AVG +b/t, [facility]   Filed Weights   06/29/22 0010 06/30/22 0431  Weight: 47.9 kg 35.4 kg    Intake/Output Summary (Last 24 hours) at 07/02/2022 1514 Last data filed at 07/02/2022 0800 Gross per 24 hour  Intake 1766.59 ml  Output --  Net 1766.59 ml    Additional Objective Labs: Basic Metabolic Panel: Recent Labs  Lab 06/30/22 0051 07/01/22 0112 07/02/22 0139  NA 130* 135 135  K 5.8* 4.9 4.9  CL 92* 94* 91*  CO2 23 25 27   GLUCOSE 96 97 97  BUN 90* 55* 81*  CREATININE 5.00* 3.36* 4.43*  CALCIUM 8.1* 8.6* 8.4*  PHOS  --  3.7  --    Liver Function Tests: Recent Labs  Lab 06/27/22 0802 06/30/22 0051 07/01/22 0112  AST 44* 62* 58*  ALT 24 39 36  ALKPHOS 83 117 122  BILITOT 0.4 0.5 0.3  PROT 6.2* 6.4* 6.3*  ALBUMIN 1.6* <1.5* <1.5*   No results for input(s): "LIPASE", "AMYLASE" in the last 168 hours. CBC: Recent Labs  Lab 06/27/22 0802 06/29/22 0049 06/30/22 0051 06/30/22 1821 07/01/22 0112 07/01/22 1629 07/02/22 0139   WBC 10.4 12.9* 13.7*  --  14.1*  --  12.0*  NEUTROABS  --   --   --   --  10.0*  --   --   HGB 7.1* 7.0* 7.0*   < > 6.1* 6.5* 6.1*  HCT 22.0* 22.6* 21.8*   < > 19.3* 20.3* 19.2*  MCV 85.6 86.6 83.8  --  86.2  --  85.0  PLT 395 551* 610*  --  594*  --  556*   < > = values in this interval not displayed.   Blood Culture    Component Value Date/Time   SDES BLOOD RIGHT FOREARM 06/27/2022 0802   SPECREQUEST  06/27/2022 0802    BOTTLES DRAWN AEROBIC ONLY Blood Culture results may not be optimal due to an inadequate volume of blood received in culture bottles   CULT  06/27/2022 0802    NO GROWTH 5 DAYS Performed at [facility], [place] [address], [place], [place] [address]    REPTSTATUS 07/02/2022 FINAL 06/27/2022 0802    Cardiac Enzymes: No results for input(s): "CKTOTAL", "CKMB", "CKMBINDEX", "TROPONINI" in the last 168 hours. CBG: Recent Labs  Lab 07/01/22 2012 07/01/22 2303 07/02/22 0321 07/02/22 0811 07/02/22 1046  GLUCAP 95 105* 80 77 101*   Iron Studies: No results for input(s): "IRON", "  TIBC", "TRANSFERRIN", "FERRITIN" in the last 72 hours. Lab Results  Component Value Date   INR 1.2 07/02/2022   INR 1.3 (H) 06/08/2022   INR 1.1 12/24/2021   Studies/Results: MR FOOT LEFT WO CONTRAST  Result Date: 07/02/2022 CLINICAL DATA:  Soft tissue infection suspected, foot, xray done EXAM: MRI OF THE LEFT FOOT WITHOUT CONTRAST TECHNIQUE: Multiplanar, multisequence MR imaging of the left foot was performed. No intravenous contrast was administered. COMPARISON:  CT foot 07/01/2022. FINDINGS: There is failure of fat saturation/fat inversion, severely limiting interpretation of this MRI. Bones/Joint/Cartilage There is no evidence of bony erosion or frank bony destruction. There is patchy low T1 signal in the hindfoot and midfoot, which is nonspecific, but likely related to patchy osteopenia. No marrow signal change to suggest osteomyelitis on this limited MRI. Ligaments Lisfranc  ligament appears intact. Muscles and Tendons No acute tendon tear.  Diffuse intramuscular edema. Soft tissues Diffuse soft tissue swelling. Soft tissue ulcers along the medial heel and medial forefoot adjacent to the first metatarsal head. No evidence of well-defined fluid collection on this limited MRI. There is a blister-like skin lesion overlying the dorsal medial midfoot (series 9, images 29-33). IMPRESSION: Failure of fat saturation/fat inversion, severely limiting interpretation of this MRI. Soft tissue ulcers along the medial heel and medial forefoot adjacent to the first metatarsal head, without obvious underlying osseous change to suggest osteomyelitis or soft tissue abscess on this limited MRI. Blister-like skin lesion overlying the dorsal medial midfoot noted. Electronically Signed   By: Maurine Simmering M.D.   On: 07/02/2022 08:52   MR PELVIS WO CONTRAST  Result Date: 07/02/2022 CLINICAL DATA:  Osteomyelitis suspected, pelvis, xray done r/o osteo EXAM: MRI PELVIS WITHOUT CONTRAST TECHNIQUE: Multiplanar multisequence MR imaging of the pelvis was performed. No intravenous contrast was administered. COMPARISON:  CT 06/23/2022, CT 12/25/2021 FINDINGS: There is failure of fat saturation/fat inversion despite multiple attempts, which severely limits interpretation of this MRI. Bones/Joint/Cartilage There is marrow reconversion change throughout the lower lumbar spine and pelvis. There is no obvious displaced fracture. There is well-defined sclerotic lesion within the left iliac bone seen on prior CT, nonspecific but could be a bone infarct. Lytic lesions noted on prior CT in the pelvis are poorly evaluated due to marrow reconversion changes. These lesions are unchanged since at least May 2023. There are degenerative endplate changes at J1-O8 and L5-S1. There is preserved fatty marrow signal within the coccyx and lower sacrum without obvious bony destruction. Ligaments Suboptimally assessed by CT. Muscles and  Tendons There is diffuse intramuscular edema and loss of muscle bulk. No obvious intramuscular collection on noncontrast MRI. Soft tissues There is a sacral decubitus ulcer as seen on recent CT, extending towards the surface of the coccyx and S5 vertebrae posteriorly but there are no obvious underlying osseous changes. There is a superficial right gluteal/ischial ulcer. Diffuse body wall edema. Miscellaneous Trace free fluid in the pelvis. The bladder is mildly distended with layering intraluminal material along the left aspect posteriorly. IMPRESSION: Failure of fat saturation/fat inversion despite multiple attempts, severely limiting interpretation of this MRI. Sacral decubitus ulcer at the sacrococcygeal junction without obvious underlying marrow signal change to suggest osteomyelitis or evidence of abscess on this limited MRI. Additional superficial right gluteal/ischial ulcer noted. Marrow reconversion changes in the lower lumbar spine and pelvis, as can be seen in hematologic derangement such as anemia. Diffuse intramuscular and body wall edema. Electronically Signed   By: Maurine Simmering M.D.   On: 07/02/2022 08:40  CT FOOT LEFT WO CONTRAST  Result Date: 07/01/2022 CLINICAL DATA:  Foot wounds EXAM: CT OF THE LEFT FOOT WITHOUT CONTRAST TECHNIQUE: Multidetector CT imaging of the left foot was performed according to the standard protocol. Multiplanar CT image reconstructions were also generated. RADIATION DOSE REDUCTION: This exam was performed according to the departmental dose-optimization program which includes automated exposure control, adjustment of the mA and/or kV according to patient size and/or use of iterative reconstruction technique. COMPARISON:  None Available. FINDINGS: Bones/Joint/Cartilage Osseous structures appear diffusely demineralized. No evidence of fracture or dislocation. Mild joint space narrowing is most notable at the first MTP joint. No focal bone erosion or periosteal elevation.  Ligaments Suboptimally assessed by CT. Muscles and Tendons Atrophic appearance of the musculature. No appreciable tendon abnormality by CT. Soft tissues Lack of subcutaneous fat. Possible wound in the region of the left heel and medial malleolus. Generalized subcutaneous edema. No organized fluid collection. No soft tissue gas. IMPRESSION: 1. Possible wound in the region of the left heel and medial malleolus. Generalized subcutaneous edema without organized fluid collection or soft tissue gas. 2. No acute osseous abnormality.  No CT evidence of osteomyelitis. Electronically Signed   By: Davina Poke D.O.   On: 07/01/2022 11:16    Medications:  ceFEPime (MAXIPIME) IV Stopped (07/01/22 2057)   feeding supplement (NEPRO CARB STEADY) Stopped (07/02/22 0000)   vancomycin 100 mL/hr at 07/02/22 0800    sodium chloride   Intravenous Once   chlorhexidine       Chlorhexidine Gluconate Cloth  6 each Topical Q0600   darbepoetin (ARANESP) injection - DIALYSIS  150 mcg Subcutaneous Q Thu-1800   diclofenac Sodium  4 g Topical Once   famotidine  20 mg Per Tube Daily   feeding supplement (PROSource TF20)  60 mL Per Tube Daily   [START ON 07/03/2022] heparin  5,000 Units Subcutaneous Q8H   leptospermum manuka honey  1 Application Topical Daily   metroNIDAZOLE  500 mg Per Tube BID   multivitamin  1 tablet Per Tube QHS   nutrition supplement (JUVEN)  1 packet Per Tube BID BM   mouth rinse  15 mL Mouth Rinse 4 times per day   sodium hypochlorite   Topical BID   thiamine (VITAMIN B1) injection  100 mg Intravenous Daily    Dialysis Orders: MWF at [facility] Dr 3.5h  48kg  2.2/5 bath  300/600  Hep none R AVG/TDC (AVG ok to use reportedly)   CXR 11/10- bilat splotchy perihilar disease, edema vs infxn, improved compared to 11/8 film  Assessment/Plan: 1.  Resolved acute hypoxic respiratory failure: Now stable on RA. Due to combination of pulmonary edema/volume overload with bronchospasm along with  influenza +/- and probable HCAP.  Previously improving, CXR 11/27 w/ Probable retrocardiac LLL opacity suspicious for airspace disease/pneumonia. CT w/possible LLL PNA, no other acute abnormalities. ABX started. Per PMD.  2. Fever/leukocytosis - Afebrile x 24hrs. WBC trending up today despite ABX. BC w/NGTD.  CXR 11/27 & CT 11/28 w/possible PNA. Foul smelling pressure wounds. Has TDC which always has possibility of infection but BC neg. AVG working well. IR consulted: plan for [facility] removal today. 3. Altered mental status: per staff at [facility] is not unusual for him not to respond verbally at the OP unit.  4.  ESRD: on HD MWF.  HD today per regular schedule. K 4.9 5. HD access: Has AVG with successful use.  Requested IR remove TDC when able.  Appreciate their assistance.  6.  HTN/ vol: Blood pressure mostly well controlled. Weights labile likely sig under dry wt. BP's not tolerating further UF. Minimal UF with HD.  7.  Secondary hyperparathyroidism: Holding sevelamer as P < 3.0. Calcium in goal.   8.  Anemia of chronic disease: S/p 2 units PRBCs 11/30 for Hgb 6.1. Hgb again dropped to 6.1 earlier today. S/p another 1 unit PRBC today. Aranesp raised  to 165mcg weekly yesterday.  TSAT 12% - iron course started, then held d/t concern for acute infection, will restart when off ABX. Will re-check Hgb with HD today. If Hgb is still < 7, will give another unit PRBCs. 9.  GOC - pt very frail, not interacting very much verbally. Appreciate palliative assistance. Poor prognosis. 10. Nutrition - on tube feeds - changed to Nepro.  Plan for PEG tube placement by IR on 12/1.    Tobie Poet, NP [facility] 07/02/2022,3:14 PM  LOS: 24 days

## 2022-07-02 NOTE — Progress Notes (Signed)
Pharmacy Antibiotic Note  Cody Macdonald is a 61 y.o. male admitted on 06/08/2022 on antibiotics for suspected pneumonia.  Patient has intermittently been on antibiotics since admission.  Currently on Vancomycin (d#4), Cefepime (d#4), and Flagyl (d#2).  Pharmacy has been consulted for Vancomycin and Cefepime dosing.  Pt has ESRD with usual HD days MWF.  Last HD 11/29.  Tm 101.5 last PM at time of blood administration. WBC remains elevated at 12 but improving.  BCx negative.  Tentative plan for G tube placement 12/1 by IR.  Plan: Continue Vancomycin 500mg  with each HD - MWF Continue Cefepime 1gm IV q24 Continue to follow clinical course, culture data, and antibiotic plan.  Height: 5\' 8"  (172.7 cm) Weight:  (unable to weight, bed scale is not functioning) IBW/kg (Calculated) : 68.4  Temp (24hrs), Avg:99 F (37.2 C), Min:98.1 F (36.7 C), Max:101.5 F (38.6 C)  Recent Labs  Lab 06/27/22 0802 06/29/22 0049 06/30/22 0051 07/01/22 0112 07/02/22 0139  WBC 10.4 12.9* 13.7* 14.1* 12.0*  CREATININE 5.03* 4.04* 5.00* 3.36* 4.43*    Estimated Creatinine Clearance: 8.9 mL/min (A) (by C-G formula based on SCr of 4.43 mg/dL (H)).    No Known Allergies  Antimicrobials this admission: Vanco 11/7>>11/8, 11/13 >> 11/17; 11/28>> Cefepime 11/7>>11/9, 11/13 >> 11/17; 11/28>> CTX 11/9 >> 11/12 Tamiflu 11/7 >>11/11 Unasyn 11/26>>11/27 Flagyl 11/30 >>  Dose adjustments this admission:   Microbiology results: 11/26 Bcx: negF 11/13 Bcx: negF 11/7  MRSA PCR: neg 11/7  resp pcr Flu A: positive  11/7 Bcx: negF  Thank you for allowing pharmacy to be a part of this patient's care.  [employer], Pharm.D., BCPS Clinical Pharmacist Clinical phone for 07/02/2022 from 7:30-3:00 is [phone].  **Pharmacist phone directory can be found on [email or web address] listed under [place].  07/02/2022 7:59 AM

## 2022-07-02 NOTE — Progress Notes (Signed)
PROGRESS NOTE    Cody Macdonald  CHY:[id number] DOB: 1960-09-06 DOA: 06/08/2022 PCP: Pcp, No  Chief Complaint  Patient presents with   Shortness of Breath    Brief Narrative:  61 year old male with CVA, HTN, ESRD on HD MWF, sent from his SNF due to shortness of breath and hypertensive urgency.  He was hypoxic requiring up to 10 L nasal cannula and had pulmonary edema on the chest x-ray.  He was initially in the ICU, underwent emergent dialysis and required Cleviprex infusion for hypertensive urgency.  With improvement he was transferred to the hospitalist service on 11/10.  Hospital course complicated by poor mentation, persistent encephalopathy and failed Jniya Madara swallow eval.  Palliative care was consulted and multiple GOC were held, he is now DNR and family wishes for PEG tube which should be placed later this week per IR, Plavix now on hold.  Hold aspirin as well   Assessment & Plan:   Principal Problem:   HCAP (healthcare-associated pneumonia) Active Problems:   ESRD (end stage renal disease) ([place])   Hypertensive urgency   Influenza Cody Macdonald   Acute respiratory failure with hypoxia (HCC)   Protein-calorie malnutrition, severe  Acute hypoxic respiratory failure due to multifocal pneumonia due to influenza Treysen Macdonald as well as superimposed acute pulmonary edema in the setting of fluid overload in Cody Macdonald dialysis patient-respiratory status overall improving, currently he is on room air.  He completed Cody Macdonald course of Tamiflu as well as empiric antibiotics, finished Cody Macdonald 10-day course on 11/17.   Fever CT C/Cody Macdonald/P with mild LLL airspace opacity (pneumonia vs atelectasis) Had stable lucent lesions in the right iliac bone (if concern for metastatic disease, consider bone scan) Follow RVP, covid Continue cefepime and vancomycin.  Add flagyl. Cultures 11/26 [id number] Suspect fevers related to his pressure wounds.  Possibly the ones to his L foot vs his sacrum.  MRI unfortunatley unrevealing.  Hypertensive Urgency -patient  required ICU level of care for Cody Macdonald Cleviprex drip --- currently on labetalol, hydralazine with improvement in his blood pressure.  His blood pressures have remained stable   ESRD on HD-management as per nephrology.   Hypophosphatemia, hypokalemia and hypomagnesemia, hyperkalemia-per renal   Poor p.o. intake -Has been refusing to eat and not really eating very much.  PEG at some point, holding off due to his fevers and anemia.   H/o CVA -On Aspirin and Clopidogrel, on dysphagia 1 diet but not eating very well.  Plavix and aspirin being held per IR.   Anemia of Chronic Kidney Disease  Hb gradually downtrending, chronic disease, iron def, follow S/p 3 unit pRBC (inadequate response) No obvious bleeding, will watch closely    Hypoalbuminemia -noted   Acute Metabolic Encephalopathy-overall improved but still does not engage much.   Abnormal LFTs -minimal, improving   Underweight/ Severe Malnutrition in the Context of Chronic Illness/cachexia -on tube feeds   Pressure Ulcers, not present on admission See 11/28 note, reevaluation of wounds to sacrum L foot and L heel.  Has large foul smelling unstageable wounds on the L buttock close to anus, L ischium, coccyx.  7 days of dakins.  Stage 2 to R sacrum.  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  New DTPI to LL leg.  Planning for iodine.  Prevalon boot.  Right iliac crest has DTPI.  Standard size bed with air mattress.  Due to rapid decline, they're recommending discussions regarding comfort measures.  Will reengage with palliative care when possible.  Currently working him up for infection.  DVT prophylaxis: heparin Code Status: dnr Family Communication: son 12/1 Disposition:   Status is: Inpatient Remains inpatient appropriate because: continued need for inpatient care   Consultants:  Renal  Palliative IR  Procedures:  none  Antimicrobials:  Anti-infectives (From admission, onward)    Start     Dose/Rate Route Frequency Ordered Stop    07/02/22 0800  ceFAZolin (ANCEF) IVPB 2g/100 mL premix        2 g 200 mL/hr over 30 Minutes Intravenous To Radiology 07/02/22 0041 07/02/22 0959   07/02/22 0000  ceFAZolin (ANCEF) IVPB 2g/100 mL premix  Status:  Discontinued        2 g 200 mL/hr over 30 Minutes Intravenous To Radiology 06/28/22 1653 07/02/22 0041   07/01/22 1745  metroNIDAZOLE (FLAGYL) tablet 500 mg        500 mg Per Tube 2 times daily 07/01/22 1727     06/30/22 2000  ceFEPIme (MAXIPIME) 1 g in sodium chloride 0.9 % 100 mL IVPB        1 g 200 mL/hr over 30 Minutes Intravenous Every 24 hours 06/29/22 0517     06/30/22 1200  vancomycin (VANCOREADY) IVPB 500 mg/100 mL        500 mg 100 mL/hr over 60 Minutes Intravenous Every M-W-F (Hemodialysis) 06/29/22 0517     06/29/22 0615  vancomycin (VANCOCIN) IVPB 1000 mg/200 mL premix        1,000 mg 200 mL/hr over 60 Minutes Intravenous  Once 06/29/22 0517 06/29/22 0712   06/29/22 0615  ceFEPIme (MAXIPIME) 2 g in sodium chloride 0.9 % 100 mL IVPB        2 g 200 mL/hr over 30 Minutes Intravenous  Once 06/29/22 0517 06/29/22 0614   06/27/22 1200  ampicillin-sulbactam (UNASYN) 1.5 g in sodium chloride 0.9 % 100 mL IVPB  Status:  Discontinued        1.5 g 200 mL/hr over 30 Minutes Intravenous Every 12 hours 06/27/22 1101 06/29/22 0501   06/15/22 0900  ceFEPIme (MAXIPIME) 2 g in sodium chloride 0.9 % 100 mL IVPB        2 g 200 mL/hr over 30 Minutes Intravenous  Once 06/15/22 0804 06/15/22 1044   06/14/22 1200  ceFEPIme (MAXIPIME) 2 g in sodium chloride 0.9 % 100 mL IVPB  Status:  Discontinued        2 g 200 mL/hr over 30 Minutes Intravenous Every M-W-F (Hemodialysis) 06/14/22 1008 06/18/22 1429   06/14/22 1200  vancomycin (VANCOREADY) IVPB 500 mg/100 mL  Status:  Discontinued        500 mg 100 mL/hr over 60 Minutes Intravenous Every M-W-F (Hemodialysis) 06/14/22 1008 06/18/22 1429   06/14/22 1100  vancomycin (VANCOCIN) IVPB 1000 mg/200 mL premix        1,000 mg 200 mL/hr over 60  Minutes Intravenous  Once 06/14/22 1000 06/14/22 1600   06/14/22 1100  ceFEPIme (MAXIPIME) 2 g in sodium chloride 0.9 % 100 mL IVPB        2 g 200 mL/hr over 30 Minutes Intravenous  Once 06/14/22 1000 06/14/22 1259   06/12/22 2000  oseltamivir (TAMIFLU) capsule 30 mg        30 mg Oral  Once 06/12/22 1440 06/12/22 2149   06/10/22 2100  oseltamivir (TAMIFLU) capsule 30 mg        30 mg Oral  Once 06/10/22 0825 06/11/22 0517   06/10/22 1030  cefTRIAXone (ROCEPHIN) 2 g in sodium chloride 0.9 % 100 mL IVPB  Status:  Discontinued        2 g 200 mL/hr over 30 Minutes Intravenous Every 24 hours 06/10/22 0945 06/14/22 1000   06/09/22 2200  ceFEPIme (MAXIPIME) 1 g in sodium chloride 0.9 % 100 mL IVPB  Status:  Discontinued        1 g 200 mL/hr over 30 Minutes Intravenous Every 24 hours 06/08/22 1611 06/10/22 0945   06/09/22 1015  oseltamivir (TAMIFLU) capsule 30 mg        30 mg Oral  Once 06/09/22 0920 06/09/22 1045   06/08/22 1830  oseltamivir (TAMIFLU) capsule 30 mg  Status:  Discontinued        30 mg Oral  Once 06/08/22 1818 06/09/22 0920   06/08/22 1612  vancomycin variable dose per unstable renal function (pharmacist dosing)  Status:  Discontinued         Does not apply See admin instructions 06/08/22 1612 06/09/22 0920   06/08/22 1100  vancomycin (VANCOCIN) IVPB 1000 mg/200 mL premix        1,000 mg 200 mL/hr over 60 Minutes Intravenous  Once 06/08/22 1057 06/08/22 1329   06/08/22 1100  ceFEPIme (MAXIPIME) 2 g in sodium chloride 0.9 % 100 mL IVPB        2 g 200 mL/hr over 30 Minutes Intravenous  Once 06/08/22 1057 06/08/22 1225       Subjective: Difficult to understand him, no verbalized complaints  Objective: Vitals:   07/02/22 0705 07/02/22 0800 07/02/22 1045 07/02/22 1200  BP: 135/60 132/60 131/67 133/69  Pulse: (!) 111 (!) 114 (!) 115 (!) 109  Resp: 16 16 18 15   Temp: 98.4 F (36.9 C)  98.5 F (36.9 C)   TempSrc: Axillary  Oral   SpO2: 97% 98% 98% 97%  Weight:      Height:         Intake/Output Summary (Last 24 hours) at 07/02/2022 1854 Last data filed at 07/02/2022 0800 Gross per 24 hour  Intake 1766.59 ml  Output --  Net 1766.59 ml   Filed Weights   06/29/22 0010 06/30/22 0431  Weight: 47.9 kg 35.4 kg    Examination:  General: No acute distress. Cardiovascular: RRR Lungs: unlabored Abdomen: Soft, nontender, nondistended  Neurological: difficult to understand, curled up on his R side Extremities: No clubbing or cyanosis. No edema.  Data Reviewed: I have personally reviewed following labs and imaging studies  CBC: Recent Labs  Lab 06/27/22 0802 06/29/22 0049 06/30/22 0051 06/30/22 1821 07/01/22 0112 07/01/22 1629 07/02/22 0139 07/02/22 1825  WBC 10.4 12.9* 13.7*  --  14.1*  --  12.0*  --   NEUTROABS  --   --   --   --  10.0*  --   --   --   HGB 7.1* 7.0* 7.0* 7.2* 6.1* 6.5* 6.1* 7.5*  HCT 22.0* 22.6* 21.8* 22.6* 19.3* 20.3* 19.2* 22.4*  MCV 85.6 86.6 83.8  --  86.2  --  85.0  --   PLT 395 551* 610*  --  594*  --  556*  --     Basic Metabolic Panel: Recent Labs  Lab 06/27/22 0802 06/29/22 0049 06/30/22 0051 07/01/22 0112 07/02/22 0139  NA 134* 136 130* 135 135  K 6.6* 5.5* 5.8* 4.9 4.9  CL 92* 96* 92* 94* 91*  CO2 28 26 23 25 27   GLUCOSE 91 104* 96 97 97  BUN 77* 66* 90* 55* 81*  CREATININE 5.03* 4.04* 5.00* 3.36* 4.43*  CALCIUM 8.6* 8.5* 8.1*  8.6* 8.4*  MG  --   --  2.6* 2.2  --   PHOS  --   --   --  3.7  --     GFR: Estimated Creatinine Clearance: 8.9 mL/min (Lailee Hoelzel) (by C-G formula based on SCr of 4.43 mg/dL (H)).  Liver Function Tests: Recent Labs  Lab 06/27/22 0802 06/30/22 0051 07/01/22 0112  AST 44* 62* 58*  ALT 24 39 36  ALKPHOS 83 117 122  BILITOT 0.4 0.5 0.3  PROT 6.2* 6.4* 6.3*  ALBUMIN 1.6* <1.5* <1.5*    CBG: Recent Labs  Lab 07/01/22 2303 07/02/22 0321 07/02/22 0811 07/02/22 1046 07/02/22 1732  GLUCAP 105* 80 77 101* 81     Recent Results (from the past 240 hour(s))  Culture, blood  (Routine X 2) w Reflex to ID Panel     Status: None   Collection Time: 06/27/22  7:51 AM   Specimen: BLOOD  Result Value Ref Range Status   Specimen Description BLOOD RIGHT ANTECUBITAL  Final   Special Requests   Final    BOTTLES DRAWN AEROBIC AND ANAEROBIC Blood Culture results may not be optimal due to an inadequate volume of blood received in culture bottles   Culture   Final    NO GROWTH 5 DAYS Performed at [facility], [place] [address], [place], [place] [address]    Report Status 07/02/2022 FINAL  Final  Culture, blood (Routine X 2) w Reflex to ID Panel     Status: None   Collection Time: 06/27/22  8:02 AM   Specimen: BLOOD RIGHT FOREARM  Result Value Ref Range Status   Specimen Description BLOOD RIGHT FOREARM  Final   Special Requests   Final    BOTTLES DRAWN AEROBIC ONLY Blood Culture results may not be optimal due to an inadequate volume of blood received in culture bottles   Culture   Final    NO GROWTH 5 DAYS Performed at [facility], [place] [address] [place], [place] [address]    Report Status 07/02/2022 FINAL  Final  Respiratory (~20 pathogens) panel by PCR     Status: None   Collection Time: 06/30/22  8:10 AM   Specimen: Nasopharyngeal Swab; Respiratory  Result Value Ref Range Status   Adenovirus NOT DETECTED NOT DETECTED Final   Coronavirus 229E NOT DETECTED NOT DETECTED Final    Comment: (NOTE) The Coronavirus on the Respiratory Panel, DOES NOT test for the novel  Coronavirus (2019 nCoV)    Coronavirus HKU1 NOT DETECTED NOT DETECTED Final   Coronavirus NL63 NOT DETECTED NOT DETECTED Final   Coronavirus OC43 NOT DETECTED NOT DETECTED Final   Metapneumovirus NOT DETECTED NOT DETECTED Final   Rhinovirus / Enterovirus NOT DETECTED NOT DETECTED Final   Influenza Kaina Orengo NOT DETECTED NOT DETECTED Final   Influenza B NOT DETECTED NOT DETECTED Final   Parainfluenza Virus 1 NOT DETECTED NOT DETECTED Final   Parainfluenza Virus 2 NOT DETECTED NOT DETECTED Final    Parainfluenza Virus 3 NOT DETECTED NOT DETECTED Final   Parainfluenza Virus 4 NOT DETECTED NOT DETECTED Final   Respiratory Syncytial Virus NOT DETECTED NOT DETECTED Final   Bordetella pertussis NOT DETECTED NOT DETECTED Final   Bordetella Parapertussis NOT DETECTED NOT DETECTED Final   Chlamydophila pneumoniae NOT DETECTED NOT DETECTED Final   Mycoplasma pneumoniae NOT DETECTED NOT DETECTED Final    Comment: Performed at [facility], [place] [address] [place],  [address]  SARS Coronavirus 2 by  RT PCR (hospital order, performed in [facility] hospital lab) *cepheid single result test* Anterior Nasal Swab     Status: None   Collection Time: 06/30/22  8:10 AM   Specimen: Anterior Nasal Swab  Result Value Ref Range Status   SARS Coronavirus 2 by RT PCR NEGATIVE NEGATIVE Final    Comment: (NOTE) SARS-CoV-2 target nucleic acids are NOT DETECTED.  The SARS-CoV-2 RNA is generally detectable in upper and lower respiratory specimens during the acute phase of infection. The lowest concentration of SARS-CoV-2 viral copies this assay can detect is 250 copies / mL. Dreama Kuna negative result does not preclude SARS-CoV-2 infection and should not be used as the sole basis for treatment or other patient management decisions.  Sheina Mcleish negative result may occur with improper specimen collection / handling, submission of specimen other than nasopharyngeal swab, presence of viral mutation(s) within the areas targeted by this assay, and inadequate number of viral copies (<250 copies / mL). Aboubacar Matsuo negative result must be combined with clinical observations, patient history, and epidemiological information.  Fact Sheet for Patients:   https://www.patel.info/  Fact Sheet for Healthcare Providers: https://hall.com/  This test is not yet approved or  cleared by the [place] FDA and has been authorized for detection and/or diagnosis of SARS-CoV-2 by FDA under an  Emergency Use Authorization (EUA).  This EUA will remain in effect (meaning this test can be used) for the duration of the COVID-19 declaration under Section 564(b)(1) of the Act, 21 U.S.C. section 360bbb-3(b)(1), unless the authorization is terminated or revoked sooner.  Performed at [facility], [place] [address], [place], [place] [address]          Radiology Studies: IR Removal Tun Cv Cath W/O FL  Result Date: 07/02/2022 INDICATION: End-stage renal disease on hemodialysis. Now with functional arteriovenous fistula. Request for removal of tunneled hemodialysis catheter. It was initially placed on Dec 25, 2021 by Dr. Dwaine Gale. EXAM: REMOVAL OF TUNNELED HEMODIALYSIS CATHETER MEDICATIONS: 1% lidocaine 4 mL COMPLICATIONS: None immediate. PROCEDURE: Informed written consent was obtained from the patient following an explanation of the procedure, risks, benefits and alternatives to treatment. Guerin Lashomb time out was performed prior to the initiation of the procedure. Maximal barrier sterile technique was utilized including caps, mask, sterile gowns, sterile gloves, large sterile drape, and hand hygiene. ChloraPrep was used to prep the patient's right neck, chest and existing catheter. The cuff was palpated about 2 inches above the exit site of the catheter. 1% lidocaine was injected around the cuff of the catheter . Using Shayonna Ocampo #15 blade, Fouad Taul small incision was made over the cuff. The catheter was dissected out using scissors and curved hemostats until the cuff was freed from the surrounding fibrous sheath and the catheter was removed without difficulty. Next 3-0 Vicryl suture was used to approximate on the skin only at the incision site. Sterile dressing was placed. The patient tolerated the procedure well without immediate post procedural complication. IMPRESSION: Successful removal of tunneled dialysis catheter. Procedure performed by: Gareth Eagle, PA-C Electronically Signed   By: Ruthann Cancer M.D.   On:  07/02/2022 16:46   MR FOOT LEFT WO CONTRAST  Result Date: 07/02/2022 CLINICAL DATA:  Soft tissue infection suspected, foot, xray done EXAM: MRI OF THE LEFT FOOT WITHOUT CONTRAST TECHNIQUE: Multiplanar, multisequence MR imaging of the left foot was performed. No intravenous contrast was administered. COMPARISON:  CT foot 07/01/2022. FINDINGS: There is failure of fat saturation/fat inversion, severely limiting interpretation of this MRI. Bones/Joint/Cartilage There is  no evidence of bony erosion or frank bony destruction. There is patchy low T1 signal in the hindfoot and midfoot, which is nonspecific, but likely related to patchy osteopenia. No marrow signal change to suggest osteomyelitis on this limited MRI. Ligaments Lisfranc ligament appears intact. Muscles and Tendons No acute tendon tear.  Diffuse intramuscular edema. Soft tissues Diffuse soft tissue swelling. Soft tissue ulcers along the medial heel and medial forefoot adjacent to the first metatarsal head. No evidence of well-defined fluid collection on this limited MRI. There is Jonathon Tan blister-like skin lesion overlying the dorsal medial midfoot (series 9, images 29-33). IMPRESSION: Failure of fat saturation/fat inversion, severely limiting interpretation of this MRI. Soft tissue ulcers along the medial heel and medial forefoot adjacent to the first metatarsal head, without obvious underlying osseous change to suggest osteomyelitis or soft tissue abscess on this limited MRI. Blister-like skin lesion overlying the dorsal medial midfoot noted. Electronically Signed   By: Maurine Simmering M.D.   On: 07/02/2022 08:52   MR PELVIS WO CONTRAST  Result Date: 07/02/2022 CLINICAL DATA:  Osteomyelitis suspected, pelvis, xray done r/o osteo EXAM: MRI PELVIS WITHOUT CONTRAST TECHNIQUE: Multiplanar multisequence MR imaging of the pelvis was performed. No intravenous contrast was administered. COMPARISON:  CT 06/23/2022, CT 12/25/2021 FINDINGS: There is failure of fat  saturation/fat inversion despite multiple attempts, which severely limits interpretation of this MRI. Bones/Joint/Cartilage There is marrow reconversion change throughout the lower lumbar spine and pelvis. There is no obvious displaced fracture. There is well-defined sclerotic lesion within the left iliac bone seen on prior CT, nonspecific but could be Koda Defrank bone infarct. Lytic lesions noted on prior CT in the pelvis are poorly evaluated due to marrow reconversion changes. These lesions are unchanged since at least May 2023. There are degenerative endplate changes at I6-E7 and L5-S1. There is preserved fatty marrow signal within the coccyx and lower sacrum without obvious bony destruction. Ligaments Suboptimally assessed by CT. Muscles and Tendons There is diffuse intramuscular edema and loss of muscle bulk. No obvious intramuscular collection on noncontrast MRI. Soft tissues There is Kalyna Paolella sacral decubitus ulcer as seen on recent CT, extending towards the surface of the coccyx and S5 vertebrae posteriorly but there are no obvious underlying osseous changes. There is Timmya Blazier superficial right gluteal/ischial ulcer. Diffuse body wall edema. Miscellaneous Trace free fluid in the pelvis. The bladder is mildly distended with layering intraluminal material along the left aspect posteriorly. IMPRESSION: Failure of fat saturation/fat inversion despite multiple attempts, severely limiting interpretation of this MRI. Sacral decubitus ulcer at the sacrococcygeal junction without obvious underlying marrow signal change to suggest osteomyelitis or evidence of abscess on this limited MRI. Additional superficial right gluteal/ischial ulcer noted. Marrow reconversion changes in the lower lumbar spine and pelvis, as can be seen in hematologic derangement such as anemia. Diffuse intramuscular and body wall edema. Electronically Signed   By: Maurine Simmering M.D.   On: 07/02/2022 08:40   CT FOOT LEFT WO CONTRAST  Result Date: 07/01/2022 CLINICAL  DATA:  Foot wounds EXAM: CT OF THE LEFT FOOT WITHOUT CONTRAST TECHNIQUE: Multidetector CT imaging of the left foot was performed according to the standard protocol. Multiplanar CT image reconstructions were also generated. RADIATION DOSE REDUCTION: This exam was performed according to the departmental dose-optimization program which includes automated exposure control, adjustment of the mA and/or kV according to patient size and/or use of iterative reconstruction technique. COMPARISON:  None Available. FINDINGS: Bones/Joint/Cartilage Osseous structures appear diffusely demineralized. No evidence of fracture or dislocation. Mild joint  space narrowing is most notable at the first MTP joint. No focal bone erosion or periosteal elevation. Ligaments Suboptimally assessed by CT. Muscles and Tendons Atrophic appearance of the musculature. No appreciable tendon abnormality by CT. Soft tissues Lack of subcutaneous fat. Possible wound in the region of the left heel and medial malleolus. Generalized subcutaneous edema. No organized fluid collection. No soft tissue gas. IMPRESSION: 1. Possible wound in the region of the left heel and medial malleolus. Generalized subcutaneous edema without organized fluid collection or soft tissue gas. 2. No acute osseous abnormality.  No CT evidence of osteomyelitis. Electronically Signed   By: Davina Poke D.O.   On: 07/01/2022 11:16        Scheduled Meds:  sodium chloride   Intravenous Once   chlorhexidine       Chlorhexidine Gluconate Cloth  6 each Topical Q0600   darbepoetin (ARANESP) injection - DIALYSIS  150 mcg Subcutaneous Q Thu-1800   diclofenac Sodium  4 g Topical Once   famotidine  20 mg Per Tube Daily   feeding supplement (PROSource TF20)  60 mL Per Tube Daily   [START ON 07/03/2022] heparin  5,000 Units Subcutaneous Q8H   leptospermum manuka honey  1 Application Topical Daily   lidocaine       metroNIDAZOLE  500 mg Per Tube BID   multivitamin  1 tablet Per  Tube QHS   nutrition supplement (JUVEN)  1 packet Per Tube BID BM   mouth rinse  15 mL Mouth Rinse 4 times per day   sodium hypochlorite   Topical BID   thiamine (VITAMIN B1) injection  100 mg Intravenous Daily   Continuous Infusions:  ceFEPime (MAXIPIME) IV Stopped (07/01/22 2057)   feeding supplement (NEPRO CARB STEADY) Stopped (07/02/22 0000)   vancomycin 100 mL/hr at 07/02/22 0800     LOS: 24 days    Time spent: over 30 min    Fayrene Helper, MD [facility]   To contact the attending provider between 7A-7P or the covering provider during after hours 7P-7A, please log into the web site [email or web address] and access using universal [place] password for that web site. If you do not have the password, please call the hospital operator.  07/02/2022, 6:54 PM

## 2022-07-03 DIAGNOSIS — J189 Pneumonia, unspecified organism: Secondary | ICD-10-CM | POA: Diagnosis not present

## 2022-07-03 LAB — BPAM RBC
Blood Product Expiration Date: 202312062359
Blood Product Expiration Date: 202312212359
Blood Product Expiration Date: 202312212359
ISSUE DATE / TIME: 202311301111
ISSUE DATE / TIME: 202311302124
ISSUE DATE / TIME: 202312010458
Unit Type and Rh: 5100
Unit Type and Rh: 5100
Unit Type and Rh: 5100

## 2022-07-03 LAB — COMPREHENSIVE METABOLIC PANEL
ALT: 13 U/L (ref 0–44)
AST: 38 U/L (ref 15–41)
Albumin: <1.5 g/dL — ABNORMAL LOW (ref 3.5–5.0)
Alkaline Phosphatase: 103 U/L (ref 38–126)
Anion gap: 15 (ref 5–15)
BUN: 118 mg/dL — ABNORMAL HIGH (ref 6–20)
CO2: 26 mmol/L (ref 22–32)
Calcium: 9 mg/dL (ref 8.9–10.3)
Chloride: 95 mmol/L — ABNORMAL LOW (ref 98–111)
Creatinine, Ser: 5.79 mg/dL — ABNORMAL HIGH (ref 0.61–1.24)
GFR, Estimated: 10 mL/min — ABNORMAL LOW (ref >60)
Glucose, Bld: 101 mg/dL — ABNORMAL HIGH (ref 70–99)
Potassium: 4.9 mmol/L (ref 3.5–5.1)
Sodium: 136 mmol/L (ref 135–145)
Total Bilirubin: 0.4 mg/dL (ref 0.3–1.2)
Total Protein: 6.1 g/dL — ABNORMAL LOW (ref 6.5–8.1)

## 2022-07-03 LAB — CBC WITH DIFFERENTIAL/PLATELET
Abs Immature Granulocytes: 0.08 10*3/uL — ABNORMAL HIGH (ref 0.00–0.07)
Basophils Absolute: 0.1 10*3/uL (ref 0.0–0.1)
Basophils Relative: 1 %
Eosinophils Absolute: 0.8 10*3/uL — ABNORMAL HIGH (ref 0.0–0.5)
Eosinophils Relative: 5 %
HCT: 23.6 % — ABNORMAL LOW (ref 39.0–52.0)
Hemoglobin: 7.5 g/dL — ABNORMAL LOW (ref 13.0–17.0)
Immature Granulocytes: 1 %
Lymphocytes Relative: 17 %
Lymphs Abs: 2.3 10*3/uL (ref 0.7–4.0)
MCH: 27.2 pg (ref 26.0–34.0)
MCHC: 31.8 g/dL (ref 30.0–36.0)
MCV: 85.5 fL (ref 80.0–100.0)
Monocytes Absolute: 1.3 10*3/uL — ABNORMAL HIGH (ref 0.1–1.0)
Monocytes Relative: 10 %
Neutro Abs: 9.3 10*3/uL — ABNORMAL HIGH (ref 1.7–7.7)
Neutrophils Relative %: 66 %
Platelets: 575 10*3/uL — ABNORMAL HIGH (ref 150–400)
RBC: 2.76 MIL/uL — ABNORMAL LOW (ref 4.22–5.81)
RDW: 15.9 % — ABNORMAL HIGH (ref 11.5–15.5)
WBC: 13.8 10*3/uL — ABNORMAL HIGH (ref 4.0–10.5)
nRBC: 0 % (ref 0.0–0.2)

## 2022-07-03 LAB — TYPE AND SCREEN
ABO/RH(D): O POS
Antibody Screen: NEGATIVE
Unit division: 0
Unit division: 0
Unit division: 0

## 2022-07-03 LAB — GLUCOSE, CAPILLARY
Glucose-Capillary: 83 mg/dL (ref 70–99)
Glucose-Capillary: 84 mg/dL (ref 70–99)
Glucose-Capillary: 96 mg/dL (ref 70–99)
Glucose-Capillary: 95 mg/dL (ref 70–99)

## 2022-07-03 LAB — PHOSPHORUS: Phosphorus: 5.2 mg/dL — ABNORMAL HIGH (ref 2.5–4.6)

## 2022-07-03 LAB — MAGNESIUM: Magnesium: 2.7 mg/dL — ABNORMAL HIGH (ref 1.7–2.4)

## 2022-07-03 NOTE — Progress Notes (Signed)
[facility] Progress Note   Subjective:    Seen and examined patient on HD. Tolerating UFG 1L. Current SBP 110. He opened his eyes to voice and responded "yes" but then nothing else. S/p 1 unit PRBCs yesterday (noted 3 units PRBCs given total this admission). Hgb now 7.5. S/p TDC removal yesterday by IR.  Objective Vitals:   07/03/22 0830 07/03/22 0900 07/03/22 0930 07/03/22 1000  BP: 127/64 138/61 (!) 133/59 (!) 96/50  Pulse: 100 93 98 94  Resp: 11 12 (!) 24 (!) 24  Temp:      TempSrc:      SpO2: 99% 99% 99% 97%  Weight:      Height:       Physical Exam General: chronically ill appearing, contracted and cachetic male in NAD Heart:+tachycardia Lungs:CTA anteriorly Abdomen:soft, NTND Extremities:no LE edema Dialysis Access: LU AVG +b/t, [facility]   Filed Weights   06/30/22 0431 07/03/22 0726  Weight: 35.4 kg 85 kg   No intake or output data in the 24 hours ending 07/03/22 1020  Additional Objective Labs: Basic Metabolic Panel: Recent Labs  Lab 07/01/22 0112 07/02/22 0139 07/03/22 0749  NA 135 135 136  K 4.9 4.9 4.9  CL 94* 91* 95*  CO2 25 27 26   GLUCOSE 97 97 101*  BUN 55* 81* 118*  CREATININE 3.36* 4.43* 5.79*  CALCIUM 8.6* 8.4* 9.0  PHOS 3.7  --  5.2*   Liver Function Tests: Recent Labs  Lab 06/30/22 0051 07/01/22 0112 07/03/22 0749  AST 62* 58* 38  ALT 39 36 13  ALKPHOS 117 122 103  BILITOT 0.5 0.3 0.4  PROT 6.4* 6.3* 6.1*  ALBUMIN <1.5* <1.5* <1.5*   No results for input(s): "LIPASE", "AMYLASE" in the last 168 hours. CBC: Recent Labs  Lab 06/29/22 0049 06/30/22 0051 06/30/22 1821 07/01/22 0112 07/01/22 1629 07/02/22 0139 07/02/22 1825 07/03/22 0749  WBC 12.9* 13.7*  --  14.1*  --  12.0*  --  13.8*  NEUTROABS  --   --   --  10.0*  --   --   --  9.3*  HGB 7.0* 7.0*   < > 6.1*   < > 6.1* 7.5* 7.5*  HCT 22.6* 21.8*   < > 19.3*   < > 19.2* 22.4* 23.6*  MCV 86.6 83.8  --  86.2  --  85.0  --  85.5  PLT 551* 610*  --  594*  --   556*  --  575*   < > = values in this interval not displayed.   Blood Culture    Component Value Date/Time   SDES BLOOD RIGHT FOREARM 06/27/2022 0802   SPECREQUEST  06/27/2022 0802    BOTTLES DRAWN AEROBIC ONLY Blood Culture results may not be optimal due to an inadequate volume of blood received in culture bottles   CULT  06/27/2022 0802    NO GROWTH 5 DAYS Performed at [facility], [place] [address] [place], [place] [address]    REPTSTATUS 07/02/2022 FINAL 06/27/2022 0802    Cardiac Enzymes: No results for input(s): "CKTOTAL", "CKMB", "CKMBINDEX", "TROPONINI" in the last 168 hours. CBG: Recent Labs  Lab 07/02/22 0811 07/02/22 1046 07/02/22 1732 07/02/22 2052 07/03/22 0443  GLUCAP 77 101* 81 89 83   Iron Studies: No results for input(s): "IRON", "TIBC", "TRANSFERRIN", "FERRITIN" in the last 72 hours. Lab Results  Component Value Date   INR 1.2 07/02/2022   INR 1.3 (H) 06/08/2022   INR 1.1 12/24/2021  Studies/Results: IR Removal Tun Cv Cath W/O FL  Result Date: 07/02/2022 INDICATION: End-stage renal disease on hemodialysis. Now with functional arteriovenous fistula. Request for removal of tunneled hemodialysis catheter. It was initially placed on Dec 25, 2021 by Dr. Dwaine Gale. EXAM: REMOVAL OF TUNNELED HEMODIALYSIS CATHETER MEDICATIONS: 1% lidocaine 4 mL COMPLICATIONS: None immediate. PROCEDURE: Informed written consent was obtained from the patient following an explanation of the procedure, risks, benefits and alternatives to treatment. A time out was performed prior to the initiation of the procedure. Maximal barrier sterile technique was utilized including caps, mask, sterile gowns, sterile gloves, large sterile drape, and hand hygiene. ChloraPrep was used to prep the patient's right neck, chest and existing catheter. The cuff was palpated about 2 inches above the exit site of the catheter. 1% lidocaine was injected around the cuff of the catheter . Using a #15 blade, a small  incision was made over the cuff. The catheter was dissected out using scissors and curved hemostats until the cuff was freed from the surrounding fibrous sheath and the catheter was removed without difficulty. Next 3-0 Vicryl suture was used to approximate on the skin only at the incision site. Sterile dressing was placed. The patient tolerated the procedure well without immediate post procedural complication. IMPRESSION: Successful removal of tunneled dialysis catheter. Procedure performed by: Gareth Eagle, PA-C Electronically Signed   By: Ruthann Cancer M.D.   On: 07/02/2022 16:46   MR FOOT LEFT WO CONTRAST  Result Date: 07/02/2022 CLINICAL DATA:  Soft tissue infection suspected, foot, xray done EXAM: MRI OF THE LEFT FOOT WITHOUT CONTRAST TECHNIQUE: Multiplanar, multisequence MR imaging of the left foot was performed. No intravenous contrast was administered. COMPARISON:  CT foot 07/01/2022. FINDINGS: There is failure of fat saturation/fat inversion, severely limiting interpretation of this MRI. Bones/Joint/Cartilage There is no evidence of bony erosion or frank bony destruction. There is patchy low T1 signal in the hindfoot and midfoot, which is nonspecific, but likely related to patchy osteopenia. No marrow signal change to suggest osteomyelitis on this limited MRI. Ligaments Lisfranc ligament appears intact. Muscles and Tendons No acute tendon tear.  Diffuse intramuscular edema. Soft tissues Diffuse soft tissue swelling. Soft tissue ulcers along the medial heel and medial forefoot adjacent to the first metatarsal head. No evidence of well-defined fluid collection on this limited MRI. There is a blister-like skin lesion overlying the dorsal medial midfoot (series 9, images 29-33). IMPRESSION: Failure of fat saturation/fat inversion, severely limiting interpretation of this MRI. Soft tissue ulcers along the medial heel and medial forefoot adjacent to the first metatarsal head, without obvious underlying osseous  change to suggest osteomyelitis or soft tissue abscess on this limited MRI. Blister-like skin lesion overlying the dorsal medial midfoot noted. Electronically Signed   By: Maurine Simmering M.D.   On: 07/02/2022 08:52   MR PELVIS WO CONTRAST  Result Date: 07/02/2022 CLINICAL DATA:  Osteomyelitis suspected, pelvis, xray done r/o osteo EXAM: MRI PELVIS WITHOUT CONTRAST TECHNIQUE: Multiplanar multisequence MR imaging of the pelvis was performed. No intravenous contrast was administered. COMPARISON:  CT 06/23/2022, CT 12/25/2021 FINDINGS: There is failure of fat saturation/fat inversion despite multiple attempts, which severely limits interpretation of this MRI. Bones/Joint/Cartilage There is marrow reconversion change throughout the lower lumbar spine and pelvis. There is no obvious displaced fracture. There is well-defined sclerotic lesion within the left iliac bone seen on prior CT, nonspecific but could be a bone infarct. Lytic lesions noted on prior CT in the pelvis are poorly evaluated due to marrow  reconversion changes. These lesions are unchanged since at least May 2023. There are degenerative endplate changes at C1-U3 and L5-S1. There is preserved fatty marrow signal within the coccyx and lower sacrum without obvious bony destruction. Ligaments Suboptimally assessed by CT. Muscles and Tendons There is diffuse intramuscular edema and loss of muscle bulk. No obvious intramuscular collection on noncontrast MRI. Soft tissues There is a sacral decubitus ulcer as seen on recent CT, extending towards the surface of the coccyx and S5 vertebrae posteriorly but there are no obvious underlying osseous changes. There is a superficial right gluteal/ischial ulcer. Diffuse body wall edema. Miscellaneous Trace free fluid in the pelvis. The bladder is mildly distended with layering intraluminal material along the left aspect posteriorly. IMPRESSION: Failure of fat saturation/fat inversion despite multiple attempts, severely  limiting interpretation of this MRI. Sacral decubitus ulcer at the sacrococcygeal junction without obvious underlying marrow signal change to suggest osteomyelitis or evidence of abscess on this limited MRI. Additional superficial right gluteal/ischial ulcer noted. Marrow reconversion changes in the lower lumbar spine and pelvis, as can be seen in hematologic derangement such as anemia. Diffuse intramuscular and body wall edema. Electronically Signed   By: Maurine Simmering M.D.   On: 07/02/2022 08:40   CT FOOT LEFT WO CONTRAST  Result Date: 07/01/2022 CLINICAL DATA:  Foot wounds EXAM: CT OF THE LEFT FOOT WITHOUT CONTRAST TECHNIQUE: Multidetector CT imaging of the left foot was performed according to the standard protocol. Multiplanar CT image reconstructions were also generated. RADIATION DOSE REDUCTION: This exam was performed according to the departmental dose-optimization program which includes automated exposure control, adjustment of the mA and/or kV according to patient size and/or use of iterative reconstruction technique. COMPARISON:  None Available. FINDINGS: Bones/Joint/Cartilage Osseous structures appear diffusely demineralized. No evidence of fracture or dislocation. Mild joint space narrowing is most notable at the first MTP joint. No focal bone erosion or periosteal elevation. Ligaments Suboptimally assessed by CT. Muscles and Tendons Atrophic appearance of the musculature. No appreciable tendon abnormality by CT. Soft tissues Lack of subcutaneous fat. Possible wound in the region of the left heel and medial malleolus. Generalized subcutaneous edema. No organized fluid collection. No soft tissue gas. IMPRESSION: 1. Possible wound in the region of the left heel and medial malleolus. Generalized subcutaneous edema without organized fluid collection or soft tissue gas. 2. No acute osseous abnormality.  No CT evidence of osteomyelitis. Electronically Signed   By: Davina Poke D.O.   On: 07/01/2022  11:16    Medications:  ceFEPime (MAXIPIME) IV Stopped (07/03/22 0040)   feeding supplement (NEPRO CARB STEADY) Stopped (07/02/22 0000)   vancomycin 500 mg (07/03/22 0959)    sodium chloride   Intravenous Once   Chlorhexidine Gluconate Cloth  6 each Topical Q0600   darbepoetin (ARANESP) injection - DIALYSIS  150 mcg Subcutaneous Q Thu-1800   diclofenac Sodium  4 g Topical Once   famotidine  20 mg Per Tube Daily   feeding supplement (PROSource TF20)  60 mL Per Tube Daily   heparin  5,000 Units Subcutaneous Q8H   leptospermum manuka honey  1 Application Topical Daily   metroNIDAZOLE  500 mg Per Tube BID   multivitamin  1 tablet Per Tube QHS   nutrition supplement (JUVEN)  1 packet Per Tube BID BM   mouth rinse  15 mL Mouth Rinse 4 times per day   sodium hypochlorite   Topical BID   thiamine (VITAMIN B1) injection  100 mg Intravenous Daily    Dialysis  Orders: MWF at [facility] Dr 3.5h  48kg  2.2/5 bath  300/600  Hep none R AVG/TDC (AVG ok to use reportedly)   CXR 11/10- bilat splotchy perihilar disease, edema vs infxn, improved compared to 11/8 film  Assessment/Plan: 1.  Resolved acute hypoxic respiratory failure: Now stable on RA. Due to combination of pulmonary edema/volume overload with bronchospasm along with influenza +/- and probable HCAP.  Previously improving, CXR 11/27 w/ Probable retrocardiac LLL opacity suspicious for airspace disease/pneumonia. CT w/possible LLL PNA, no other acute abnormalities. ABX started. Per PMD.  2. Fever/leukocytosis - Afebrile x 24hrs. WBC trending up today despite ABX. BC w/NGTD.  CXR 11/27 & CT 11/28 w/possible PNA. Foul smelling pressure wounds. Has TDC which always has possibility of infection but BC neg. AVG working well. IR consulted: s/p TDC removal yesterday. 3. Altered mental status: per staff at [facility] is not unusual for him not to respond verbally at the OP unit.  4.  ESRD: on HD MWF. We had to work around yesterday's [facility]  removal so HD scheduled for today. Will resume back to MWF schedule next week. 5. HD access: Has AVG with successful use.  See #2.  Appreciate their assistance.  6.  HTN/ vol: Blood pressure mostly well controlled. Weights labile likely sig under dry wt. BP's not tolerating high UF. Minimal UF with HD.  7.  Secondary hyperparathyroidism: Holding sevelamer as P < 3.0. Calcium in goal.   8.  Anemia of chronic disease: S/p 1 unit PRBCs 12/1 (total 3 units this admission). Aranesp raised  to 185mcg weekly 12/1.  TSAT 12% - iron course started, then held d/t concern for acute infection, will restart when off ABX. Hgb now 7.5. Transfuse for Hgb < 7. 9.  GOC - pt very frail, not interacting very much verbally. Appreciate palliative assistance. Poor prognosis. 10. Nutrition - on tube feeds - changed to Nepro.  Plan for PEG tube placement by IR on 12/1.    Tobie Poet, NP [facility] 07/03/2022,10:20 AM  LOS: 25 days

## 2022-07-03 NOTE — Progress Notes (Addendum)
Palliative: Chart review completed.  Attempted to see Mr. Cody Macdonald this morning but he was off the floor in HD.   PMT to continue to follow.  No charge [name], NP Palliative medicine team Team phone [phone] Greater than 50% of this time was spent counseling and coordinating care related to the above and plan.

## 2022-07-03 NOTE — Progress Notes (Signed)
   07/03/22 1139  Vitals  Temp 98.3 F (36.8 C)  Temp Source Axillary  BP (!) 149/73  MAP (mmHg) 91  BP Location Right Arm  BP Method Automatic  Patient Position (if appropriate) Lying  Pulse Rate 100  Pulse Rate Source Monitor  Resp (!) 21  Oxygen Therapy  SpO2 99 %  O2 Device Room Air  Post Treatment  Dialyzer Clearance Lightly streaked  Duration of HD Treatment -hour(s) 3.25 hour(s)  Liters Processed 76.8  Fluid Removed (mL) 900 mL  Tolerated HD Treatment Yes  Post-Hemodialysis Comments tx complete, no adverse events, minimal verbal responses, however nods head at times, vital signs stable, IV vanc given last hour of tx.  AVG/AVF Arterial Site Held (minutes) 5 minutes  AVG/AVF Venous Site Held (minutes) 5 minutes  Fistula / Graft Left Upper arm Arteriovenous fistula  No placement date or time found.   Placed prior to admission: Yes  Orientation: Left  Access Location: Upper arm  Access Type: Arteriovenous fistula  Site Condition No complications  Fistula / Graft Assessment Thrill;Bruit;Present  Status Deaccessed

## 2022-07-03 NOTE — Progress Notes (Signed)
PROGRESS NOTE    Cody Macdonald  ZJQ:[id number] DOB: [birth year] DOA: 06/08/2022 PCP: Pcp, No  Chief Complaint  Patient presents with   Shortness of Breath    Brief Narrative:  61 year old male with CVA, HTN, ESRD on HD MWF, sent from his SNF due to shortness of breath and hypertensive urgency.  He was hypoxic requiring up to 10 L nasal cannula and had pulmonary edema on the chest x-ray.  He was initially in the ICU, underwent emergent dialysis and required Cleviprex infusion for hypertensive urgency.  With improvement he was transferred to the hospitalist service on 11/10.  Hospital course complicated by poor mentation, persistent encephalopathy and failed [name] swallow eval.  Palliative care was consulted and multiple GOC were held, he is now DNR and family wishes for PEG tube which should be placed later this week per IR, Plavix now on hold.  Hold aspirin as well   Assessment & Plan:   Principal Problem:   HCAP (healthcare-associated pneumonia) Active Problems:   ESRD (end stage renal disease) ([place])   Hypertensive urgency   Influenza Raygen Dahm   Acute respiratory failure with hypoxia (HCC)   Protein-calorie malnutrition, severe  Acute hypoxic respiratory failure due to multifocal pneumonia due to influenza [name] as well as superimposed acute pulmonary edema in the setting of fluid overload in Cody Macdonald dialysis patient-respiratory status overall improving, currently he is on room air.  He completed [name] course of Tamiflu as well as empiric antibiotics, finished Brycelyn Gambino 10-day course on 11/17.   Fever Last fever 12/1 CT C/[name]/P with mild LLL airspace opacity (pneumonia vs atelectasis) Had stable lucent lesions in the right iliac bone (if concern for metastatic disease, consider bone scan) Follow RVP, covid Continue cefepime and vancomycin.  Add flagyl. Cultures 11/26 [id number] Will see if surgery thinks any options for sacral wounds Suspect fevers related to his pressure wounds.  Possibly the ones to his L foot vs  his sacrum.  MRI unfortunatley unrevealing.  Hypertensive Urgency -patient required ICU level of care for [name] Cleviprex drip --- currently on labetalol, hydralazine with improvement in his blood pressure.  His blood pressures have remained stable   ESRD on HD-management as per nephrology.   Hypophosphatemia, hypokalemia and hypomagnesemia, hyperkalemia-per renal   Poor p.o. intake -Has been refusing to eat and not really eating very much.  PEG at some point, holding off due to his fevers and anemia.   H/o CVA -On Aspirin and Clopidogrel, on dysphagia 1 diet but not eating very well.  Plavix and aspirin being held per IR.   Anemia of Chronic Kidney Disease  Hb gradually downtrending, chronic disease, iron def, follow S/p 3 unit pRBC (inadequate response, but now stable) No obvious bleeding, will watch closely    Hypoalbuminemia -noted   Acute Metabolic Encephalopathy-overall improved but still does not engage much.   Abnormal LFTs -minimal, improving   Underweight/ Severe Malnutrition in the Context of Chronic Illness/cachexia -on tube feeds   Pressure Ulcers, not present on admission See 11/28 note, reevaluation of wounds to sacrum L foot and L heel.  Has large foul smelling unstageable wounds on the L buttock close to anus, L ischium, coccyx.  7 days of dakins.  Stage 2 to R sacrum.  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  New DTPI to LL leg.  Planning for iodine.  Prevalon boot.  Right iliac crest has DTPI.  Standard size bed with air mattress.  Due to rapid decline, they're recommending discussions regarding comfort measures.  Will reengage with palliative care when possible.  Currently working him up for infection.      DVT prophylaxis: heparin Code Status: dnr Family Communication: son 12/1 Disposition:   Status is: Inpatient Remains inpatient appropriate because: continued need for inpatient care   Consultants:  Renal  Palliative IR  Procedures:  none  Antimicrobials:   Anti-infectives (From admission, onward)    Start     Dose/Rate Route Frequency Ordered Stop   07/02/22 0800  ceFAZolin (ANCEF) IVPB 2g/100 mL premix        2 g 200 mL/hr over 30 Minutes Intravenous To Radiology 07/02/22 0041 07/03/22 0342   07/02/22 0000  ceFAZolin (ANCEF) IVPB 2g/100 mL premix  Status:  Discontinued        2 g 200 mL/hr over 30 Minutes Intravenous To Radiology 06/28/22 1653 07/02/22 0041   07/01/22 1745  metroNIDAZOLE (FLAGYL) tablet 500 mg        500 mg Per Tube 2 times daily 07/01/22 1727     06/30/22 2000  ceFEPIme (MAXIPIME) 1 g in sodium chloride 0.9 % 100 mL IVPB        1 g 200 mL/hr over 30 Minutes Intravenous Every 24 hours 06/29/22 0517     06/30/22 1200  vancomycin (VANCOREADY) IVPB 500 mg/100 mL        500 mg 100 mL/hr over 60 Minutes Intravenous Every M-W-F (Hemodialysis) 06/29/22 0517     06/29/22 0615  vancomycin (VANCOCIN) IVPB 1000 mg/200 mL premix        1,000 mg 200 mL/hr over 60 Minutes Intravenous  Once 06/29/22 0517 06/29/22 0712   06/29/22 0615  ceFEPIme (MAXIPIME) 2 g in sodium chloride 0.9 % 100 mL IVPB        2 g 200 mL/hr over 30 Minutes Intravenous  Once 06/29/22 0517 06/29/22 0614   06/27/22 1200  ampicillin-sulbactam (UNASYN) 1.5 g in sodium chloride 0.9 % 100 mL IVPB  Status:  Discontinued        1.5 g 200 mL/hr over 30 Minutes Intravenous Every 12 hours 06/27/22 1101 06/29/22 0501   06/15/22 0900  ceFEPIme (MAXIPIME) 2 g in sodium chloride 0.9 % 100 mL IVPB        2 g 200 mL/hr over 30 Minutes Intravenous  Once 06/15/22 0804 06/15/22 1044   06/14/22 1200  ceFEPIme (MAXIPIME) 2 g in sodium chloride 0.9 % 100 mL IVPB  Status:  Discontinued        2 g 200 mL/hr over 30 Minutes Intravenous Every M-W-F (Hemodialysis) 06/14/22 1008 06/18/22 1429   06/14/22 1200  vancomycin (VANCOREADY) IVPB 500 mg/100 mL  Status:  Discontinued        500 mg 100 mL/hr over 60 Minutes Intravenous Every M-W-F (Hemodialysis) 06/14/22 1008 06/18/22 1429    06/14/22 1100  vancomycin (VANCOCIN) IVPB 1000 mg/200 mL premix        1,000 mg 200 mL/hr over 60 Minutes Intravenous  Once 06/14/22 1000 06/14/22 1600   06/14/22 1100  ceFEPIme (MAXIPIME) 2 g in sodium chloride 0.9 % 100 mL IVPB        2 g 200 mL/hr over 30 Minutes Intravenous  Once 06/14/22 1000 06/14/22 1259   06/12/22 2000  oseltamivir (TAMIFLU) capsule 30 mg        30 mg Oral  Once 06/12/22 1440 06/12/22 2149   06/10/22 2100  oseltamivir (TAMIFLU) capsule 30 mg        30 mg Oral  Once 06/10/22 0825 06/11/22  2130   06/10/22 1030  cefTRIAXone (ROCEPHIN) 2 g in sodium chloride 0.9 % 100 mL IVPB  Status:  Discontinued        2 g 200 mL/hr over 30 Minutes Intravenous Every 24 hours 06/10/22 0945 06/14/22 1000   06/09/22 2200  ceFEPIme (MAXIPIME) 1 g in sodium chloride 0.9 % 100 mL IVPB  Status:  Discontinued        1 g 200 mL/hr over 30 Minutes Intravenous Every 24 hours 06/08/22 1611 06/10/22 0945   06/09/22 1015  oseltamivir (TAMIFLU) capsule 30 mg        30 mg Oral  Once 06/09/22 0920 06/09/22 1045   06/08/22 1830  oseltamivir (TAMIFLU) capsule 30 mg  Status:  Discontinued        30 mg Oral  Once 06/08/22 1818 06/09/22 0920   06/08/22 1612  vancomycin variable dose per unstable renal function (pharmacist dosing)  Status:  Discontinued         Does not apply See admin instructions 06/08/22 1612 06/09/22 0920   06/08/22 1100  vancomycin (VANCOCIN) IVPB 1000 mg/200 mL premix        1,000 mg 200 mL/hr over 60 Minutes Intravenous  Once 06/08/22 1057 06/08/22 1329   06/08/22 1100  ceFEPIme (MAXIPIME) 2 g in sodium chloride 0.9 % 100 mL IVPB        2 g 200 mL/hr over 30 Minutes Intravenous  Once 06/08/22 1057 06/08/22 1225       Subjective: Difficult to understand  Objective: Vitals:   07/03/22 1139 07/03/22 1215 07/03/22 1700 07/03/22 1707  BP: (!) 149/73 (!) 147/86 (!) 152/73 (!) 152/73  Pulse: 100 (!) 104 (!) 112 (!) 112  Resp: (!) 21 16 20 18   Temp: 98.3 F (36.8 C) 98 F  (36.7 C) 98.8 F (37.1 C)   TempSrc: Axillary Oral Oral   SpO2: 99% 97% 96% 94%  Weight: 85 kg     Height:        Intake/Output Summary (Last 24 hours) at 07/03/2022 1932 Last data filed at 07/03/2022 1230 Gross per 24 hour  Intake 130 ml  Output 900 ml  Net -770 ml   Filed Weights   06/30/22 0431 07/03/22 0726 07/03/22 1139  Weight: 35.4 kg 85 kg 85 kg    Examination:  General: No acute distress. Appears comfortable. Cardiovascular: RRR Lungs: unlabored Abdomen: Soft, nontender, nondistended - NG in place Neurological: doesn't say much today, contractures Skin: deferred decub eval today, missed dressing change with RN. Extremities: No clubbing or cyanosis. No edema Data Reviewed: I have personally reviewed following labs and imaging studies  CBC: Recent Labs  Lab 06/29/22 0049 06/30/22 0051 06/30/22 1821 07/01/22 0112 07/01/22 1629 07/02/22 0139 07/02/22 1825 07/03/22 0749  WBC 12.9* 13.7*  --  14.1*  --  12.0*  --  13.8*  NEUTROABS  --   --   --  10.0*  --   --   --  9.3*  HGB 7.0* 7.0*   < > 6.1* 6.5* 6.1* 7.5* 7.5*  HCT 22.6* 21.8*   < > 19.3* 20.3* 19.2* 22.4* 23.6*  MCV 86.6 83.8  --  86.2  --  85.0  --  85.5  PLT 551* 610*  --  594*  --  556*  --  575*   < > = values in this interval not displayed.    Basic Metabolic Panel: Recent Labs  Lab 06/29/22 0049 06/30/22 0051 07/01/22 0112 07/02/22 0139 07/03/22 8657  NA 136 130* 135 135 136  K 5.5* 5.8* 4.9 4.9 4.9  CL 96* 92* 94* 91* 95*  CO2 26 23 25 27 26   GLUCOSE 104* 96 97 97 101*  BUN 66* 90* 55* 81* 118*  CREATININE 4.04* 5.00* 3.36* 4.43* 5.79*  CALCIUM 8.5* 8.1* 8.6* 8.4* 9.0  MG  --  2.6* 2.2  --  2.7*  PHOS  --   --  3.7  --  5.2*    GFR: Estimated Creatinine Clearance: 14.4 mL/min (Jaquavian Firkus) (by C-G formula based on SCr of 5.79 mg/dL (H)).  Liver Function Tests: Recent Labs  Lab 06/27/22 0802 06/30/22 0051 07/01/22 0112 07/03/22 0749  AST 44* 62* 58* 38  ALT 24 39 36 13  ALKPHOS  83 117 122 103  BILITOT 0.4 0.5 0.3 0.4  PROT 6.2* 6.4* 6.3* 6.1*  ALBUMIN 1.6* <1.5* <1.5* <1.5*    CBG: Recent Labs  Lab 07/02/22 1732 07/02/22 2052 07/03/22 0443 07/03/22 1224 07/03/22 1551  GLUCAP 81 89 83 84 96     Recent Results (from the past 240 hour(s))  Culture, blood (Routine X 2) w Reflex to ID Panel     Status: None   Collection Time: 06/27/22  7:51 AM   Specimen: BLOOD  Result Value Ref Range Status   Specimen Description BLOOD RIGHT ANTECUBITAL  Final   Special Requests   Final    BOTTLES DRAWN AEROBIC AND ANAEROBIC Blood Culture results may not be optimal due to an inadequate volume of blood received in culture bottles   Culture   Final    NO GROWTH 5 DAYS Performed at [facility], [place] [address], [place], [place] [address]    Report Status 07/02/2022 FINAL  Final  Culture, blood (Routine X 2) w Reflex to ID Panel     Status: None   Collection Time: 06/27/22  8:02 AM   Specimen: BLOOD RIGHT FOREARM  Result Value Ref Range Status   Specimen Description BLOOD RIGHT FOREARM  Final   Special Requests   Final    BOTTLES DRAWN AEROBIC ONLY Blood Culture results may not be optimal due to an inadequate volume of blood received in culture bottles   Culture   Final    NO GROWTH 5 DAYS Performed at [facility], [place] [address] [place], [place] [address]    Report Status 07/02/2022 FINAL  Final  Respiratory (~20 pathogens) panel by PCR     Status: None   Collection Time: 06/30/22  8:10 AM   Specimen: Nasopharyngeal Swab; Respiratory  Result Value Ref Range Status   Adenovirus NOT DETECTED NOT DETECTED Final   Coronavirus 229E NOT DETECTED NOT DETECTED Final    Comment: (NOTE) The Coronavirus on the Respiratory Panel, DOES NOT test for the novel  Coronavirus (2019 nCoV)    Coronavirus HKU1 NOT DETECTED NOT DETECTED Final   Coronavirus NL63 NOT DETECTED NOT DETECTED Final   Coronavirus OC43 NOT DETECTED NOT DETECTED Final   Metapneumovirus NOT  DETECTED NOT DETECTED Final   Rhinovirus / Enterovirus NOT DETECTED NOT DETECTED Final   Influenza Chianti Goh NOT DETECTED NOT DETECTED Final   Influenza B NOT DETECTED NOT DETECTED Final   Parainfluenza Virus 1 NOT DETECTED NOT DETECTED Final   Parainfluenza Virus 2 NOT DETECTED NOT DETECTED Final   Parainfluenza Virus 3 NOT DETECTED NOT DETECTED Final   Parainfluenza Virus 4 NOT DETECTED NOT DETECTED Final   Respiratory Syncytial Virus NOT DETECTED NOT DETECTED Final  Bordetella pertussis NOT DETECTED NOT DETECTED Final   Bordetella Parapertussis NOT DETECTED NOT DETECTED Final   Chlamydophila pneumoniae NOT DETECTED NOT DETECTED Final   Mycoplasma pneumoniae NOT DETECTED NOT DETECTED Final    Comment: Performed at [facility], [place] [address] [place], [place] [address]  SARS Coronavirus 2 by RT PCR (hospital order, performed in [facility] hospital lab) *cepheid single result test* Anterior Nasal Swab     Status: None   Collection Time: 06/30/22  8:10 AM   Specimen: Anterior Nasal Swab  Result Value Ref Range Status   SARS Coronavirus 2 by RT PCR NEGATIVE NEGATIVE Final    Comment: (NOTE) SARS-CoV-2 target nucleic acids are NOT DETECTED.  The SARS-CoV-2 RNA is generally detectable in upper and lower respiratory specimens during the acute phase of infection. The lowest concentration of SARS-CoV-2 viral copies this assay can detect is 250 copies / mL. Nira Visscher negative result does not preclude SARS-CoV-2 infection and should not be used as the sole basis for treatment or other patient management decisions.  Greogory Cornette negative result may occur with improper specimen collection / handling, submission of specimen other than nasopharyngeal swab, presence of viral mutation(s) within the areas targeted by this assay, and inadequate number of viral copies (<250 copies / mL). Norvell Ureste negative result must be combined with clinical observations, patient history, and epidemiological information.  Fact Sheet for  Patients:   https://www.patel.info/  Fact Sheet for Healthcare Providers: https://hall.com/  This test is not yet approved or  cleared by the [place] FDA and has been authorized for detection and/or diagnosis of SARS-CoV-2 by FDA under an Emergency Use Authorization (EUA).  This EUA will remain in effect (meaning this test can be used) for the duration of the COVID-19 declaration under Section 564(b)(1) of the Act, 21 U.S.C. section 360bbb-3(b)(1), unless the authorization is terminated or revoked sooner.  Performed at [facility], [place] [address], [place], [place] [address]          Radiology Studies: IR Removal Tun Cv Cath W/O FL  Result Date: 07/02/2022 INDICATION: End-stage renal disease on hemodialysis. Now with functional arteriovenous fistula. Request for removal of tunneled hemodialysis catheter. It was initially placed on Dec 25, 2021 by Dr. Dwaine Gale. EXAM: REMOVAL OF TUNNELED HEMODIALYSIS CATHETER MEDICATIONS: 1% lidocaine 4 mL COMPLICATIONS: None immediate. PROCEDURE: Informed written consent was obtained from the patient following an explanation of the procedure, risks, benefits and alternatives to treatment. Izick Gasbarro time out was performed prior to the initiation of the procedure. Maximal barrier sterile technique was utilized including caps, mask, sterile gowns, sterile gloves, large sterile drape, and hand hygiene. ChloraPrep was used to prep the patient's right neck, chest and existing catheter. The cuff was palpated about 2 inches above the exit site of the catheter. 1% lidocaine was injected around the cuff of the catheter . Using Emmabelle Fear #15 blade, Tiant Peixoto small incision was made over the cuff. The catheter was dissected out using scissors and curved hemostats until the cuff was freed from the surrounding fibrous sheath and the catheter was removed without difficulty. Next 3-0 Vicryl suture was used to approximate on the skin only at the  incision site. Sterile dressing was placed. The patient tolerated the procedure well without immediate post procedural complication. IMPRESSION: Successful removal of tunneled dialysis catheter. Procedure performed by: Gareth Eagle, PA-C Electronically Signed   By: Ruthann Cancer M.D.   On: 07/02/2022 16:46   MR FOOT LEFT WO CONTRAST  Result Date: 07/02/2022  CLINICAL DATA:  Soft tissue infection suspected, foot, xray done EXAM: MRI OF THE LEFT FOOT WITHOUT CONTRAST TECHNIQUE: Multiplanar, multisequence MR imaging of the left foot was performed. No intravenous contrast was administered. COMPARISON:  CT foot 07/01/2022. FINDINGS: There is failure of fat saturation/fat inversion, severely limiting interpretation of this MRI. Bones/Joint/Cartilage There is no evidence of bony erosion or frank bony destruction. There is patchy low T1 signal in the hindfoot and midfoot, which is nonspecific, but likely related to patchy osteopenia. No marrow signal change to suggest osteomyelitis on this limited MRI. Ligaments Lisfranc ligament appears intact. Muscles and Tendons No acute tendon tear.  Diffuse intramuscular edema. Soft tissues Diffuse soft tissue swelling. Soft tissue ulcers along the medial heel and medial forefoot adjacent to the first metatarsal head. No evidence of well-defined fluid collection on this limited MRI. There is Windle Huebert blister-like skin lesion overlying the dorsal medial midfoot (series 9, images 29-33). IMPRESSION: Failure of fat saturation/fat inversion, severely limiting interpretation of this MRI. Soft tissue ulcers along the medial heel and medial forefoot adjacent to the first metatarsal head, without obvious underlying osseous change to suggest osteomyelitis or soft tissue abscess on this limited MRI. Blister-like skin lesion overlying the dorsal medial midfoot noted. Electronically Signed   By: Maurine Simmering M.D.   On: 07/02/2022 08:52   MR PELVIS WO CONTRAST  Result Date: 07/02/2022 CLINICAL DATA:   Osteomyelitis suspected, pelvis, xray done r/o osteo EXAM: MRI PELVIS WITHOUT CONTRAST TECHNIQUE: Multiplanar multisequence MR imaging of the pelvis was performed. No intravenous contrast was administered. COMPARISON:  CT 06/23/2022, CT 12/25/2021 FINDINGS: There is failure of fat saturation/fat inversion despite multiple attempts, which severely limits interpretation of this MRI. Bones/Joint/Cartilage There is marrow reconversion change throughout the lower lumbar spine and pelvis. There is no obvious displaced fracture. There is well-defined sclerotic lesion within the left iliac bone seen on prior CT, nonspecific but could be Alden Bensinger bone infarct. Lytic lesions noted on prior CT in the pelvis are poorly evaluated due to marrow reconversion changes. These lesions are unchanged since at least May 2023. There are degenerative endplate changes at J8-A4 and L5-S1. There is preserved fatty marrow signal within the coccyx and lower sacrum without obvious bony destruction. Ligaments Suboptimally assessed by CT. Muscles and Tendons There is diffuse intramuscular edema and loss of muscle bulk. No obvious intramuscular collection on noncontrast MRI. Soft tissues There is Maximus Hoffert sacral decubitus ulcer as seen on recent CT, extending towards the surface of the coccyx and S5 vertebrae posteriorly but there are no obvious underlying osseous changes. There is Camdan Burdi superficial right gluteal/ischial ulcer. Diffuse body wall edema. Miscellaneous Trace free fluid in the pelvis. The bladder is mildly distended with layering intraluminal material along the left aspect posteriorly. IMPRESSION: Failure of fat saturation/fat inversion despite multiple attempts, severely limiting interpretation of this MRI. Sacral decubitus ulcer at the sacrococcygeal junction without obvious underlying marrow signal change to suggest osteomyelitis or evidence of abscess on this limited MRI. Additional superficial right gluteal/ischial ulcer noted. Marrow reconversion  changes in the lower lumbar spine and pelvis, as can be seen in hematologic derangement such as anemia. Diffuse intramuscular and body wall edema. Electronically Signed   By: Maurine Simmering M.D.   On: 07/02/2022 08:40        Scheduled Meds:  sodium chloride   Intravenous Once   Chlorhexidine Gluconate Cloth  6 each Topical Q0600   darbepoetin (ARANESP) injection - DIALYSIS  150 mcg Subcutaneous Q Thu-1800   diclofenac Sodium  4 g Topical Once   famotidine  20 mg Per Tube Daily   feeding supplement (PROSource TF20)  60 mL Per Tube Daily   heparin  5,000 Units Subcutaneous Q8H   metroNIDAZOLE  500 mg Per Tube BID   multivitamin  1 tablet Per Tube QHS   nutrition supplement (JUVEN)  1 packet Per Tube BID BM   mouth rinse  15 mL Mouth Rinse 4 times per day   sodium hypochlorite   Topical BID   thiamine (VITAMIN B1) injection  100 mg Intravenous Daily   Continuous Infusions:  ceFEPime (MAXIPIME) IV Stopped (07/03/22 0040)   feeding supplement (NEPRO CARB STEADY) 1,000 mL (07/03/22 1747)   vancomycin Stopped (07/03/22 1126)     LOS: 25 days    Time spent: over 30 min    Fayrene Helper, MD [facility]   To contact the attending provider between 7A-7P or the covering provider during after hours 7P-7A, please log into the web site [email or web address] and access using universal  password for that web site. If you do not have the password, please call the hospital operator.  07/03/2022, 7:32 PM

## 2022-07-04 DIAGNOSIS — Z515 Encounter for palliative care: Secondary | ICD-10-CM | POA: Diagnosis not present

## 2022-07-04 DIAGNOSIS — E43 Unspecified severe protein-calorie malnutrition: Secondary | ICD-10-CM | POA: Diagnosis not present

## 2022-07-04 DIAGNOSIS — Z7189 Other specified counseling: Secondary | ICD-10-CM | POA: Diagnosis not present

## 2022-07-04 DIAGNOSIS — J189 Pneumonia, unspecified organism: Secondary | ICD-10-CM | POA: Diagnosis not present

## 2022-07-04 LAB — BASIC METABOLIC PANEL
Anion gap: 13 (ref 5–15)
BUN: 69 mg/dL — ABNORMAL HIGH (ref 6–20)
CO2: 29 mmol/L (ref 22–32)
Calcium: 9.1 mg/dL (ref 8.9–10.3)
Chloride: 95 mmol/L — ABNORMAL LOW (ref 98–111)
Creatinine, Ser: 3.11 mg/dL — ABNORMAL HIGH (ref 0.61–1.24)
GFR, Estimated: 22 mL/min — ABNORMAL LOW (ref >60)
Glucose, Bld: 84 mg/dL (ref 70–99)
Potassium: 4.3 mmol/L (ref 3.5–5.1)
Sodium: 137 mmol/L (ref 135–145)

## 2022-07-04 LAB — GLUCOSE, CAPILLARY
Glucose-Capillary: 82 mg/dL (ref 70–99)
Glucose-Capillary: 86 mg/dL (ref 70–99)
Glucose-Capillary: 85 mg/dL (ref 70–99)
Glucose-Capillary: 88 mg/dL (ref 70–99)
Glucose-Capillary: 104 mg/dL — ABNORMAL HIGH (ref 70–99)
Glucose-Capillary: 95 mg/dL (ref 70–99)

## 2022-07-04 LAB — CBC
HCT: 23.9 % — ABNORMAL LOW (ref 39.0–52.0)
Hemoglobin: 7.8 g/dL — ABNORMAL LOW (ref 13.0–17.0)
MCH: 27.6 pg (ref 26.0–34.0)
MCHC: 32.6 g/dL (ref 30.0–36.0)
MCV: 84.5 fL (ref 80.0–100.0)
Platelets: 515 10*3/uL — ABNORMAL HIGH (ref 150–400)
RBC: 2.83 MIL/uL — ABNORMAL LOW (ref 4.22–5.81)
RDW: 15.9 % — ABNORMAL HIGH (ref 11.5–15.5)
WBC: 12.9 10*3/uL — ABNORMAL HIGH (ref 4.0–10.5)
nRBC: 0 % (ref 0.0–0.2)

## 2022-07-04 LAB — PHOSPHORUS: Phosphorus: 3.5 mg/dL (ref 2.5–4.6)

## 2022-07-04 LAB — MAGNESIUM: Magnesium: 2.2 mg/dL (ref 1.7–2.4)

## 2022-07-04 MED ORDER — LIDOCAINE-PRILOCAINE 2.5-2.5 % EX CREA: 1.0000 | TOPICAL_CREAM | CUTANEOUS | Status: DC | PRN

## 2022-07-04 MED ORDER — HEPARIN SODIUM (PORCINE) 1000 UNIT/ML DIALYSIS: 1000.0000 [IU] | INTRAMUSCULAR | Status: DC | PRN

## 2022-07-04 MED ORDER — ALTEPLASE 2 MG IJ SOLR: 2.0000 mg | Freq: Once | INTRAMUSCULAR | Status: DC | PRN

## 2022-07-04 MED ORDER — LIDOCAINE HCL (PF) 1 % IJ SOLN: 5.0000 mL | INTRAMUSCULAR | Status: DC | PRN

## 2022-07-04 MED ORDER — MEDIHONEY WOUND/BURN DRESSING EX PSTE
1.0000 | PASTE | Freq: Every day | CUTANEOUS | Status: DC
  Administered 2022-07-04 – 2022-07-08 (×5): 1 via TOPICAL
  Filled 2022-07-04 (×2): qty 44

## 2022-07-04 MED ORDER — PENTAFLUOROPROP-TETRAFLUOROETH EX AERO: 1.0000 | INHALATION_SPRAY | CUTANEOUS | Status: DC | PRN

## 2022-07-04 NOTE — Progress Notes (Addendum)
[facility] Progress Note   Subjective:    Seen and examined patient at bedside. In bed and appears comfortable. Opens eyes to voice but not interacting. Tolerated yesterday's HD with net UF 960ml. Next HD 12/4.  Objective Vitals:   07/04/22 0013 07/04/22 0557 07/04/22 0724 07/04/22 1035  BP: (!) 161/107 119/78 (!) 136/90 (!) 140/71  Pulse: (!) 109 (!) 117 (!) 112 (!) 106  Resp: 20 20 17 16   Temp: 98.3 F (36.8 C)  98.6 F (37 C) 99.1 F (37.3 C)  TempSrc: Oral Oral Oral Oral  SpO2: 97% 97% 96% 98%  Weight: 85 kg     Height:       Physical Exam General: chronically ill appearing, contracted and cachetic male in NAD Heart:+tachycardia Lungs:CTA anteriorly Abdomen:soft, NTND Extremities:no LE edema Dialysis Access: LU AVG +b/t, [facility]  Filed Weights   07/03/22 0726 07/03/22 1139 07/04/22 0013  Weight: 85 kg 85 kg 85 kg    Intake/Output Summary (Last 24 hours) at 07/04/2022 1402 Last data filed at 07/04/2022 3295 Gross per 24 hour  Intake 130 ml  Output --  Net 130 ml    Additional Objective Labs: Basic Metabolic Panel: Recent Labs  Lab 07/01/22 0112 07/02/22 0139 07/03/22 0749 07/04/22 0029  NA 135 135 136 137  K 4.9 4.9 4.9 4.3  CL 94* 91* 95* 95*  CO2 25 27 26 29   GLUCOSE 97 97 101* 84  BUN 55* 81* 118* 69*  CREATININE 3.36* 4.43* 5.79* 3.11*  CALCIUM 8.6* 8.4* 9.0 9.1  PHOS 3.7  --  5.2* 3.5   Liver Function Tests: Recent Labs  Lab 06/30/22 0051 07/01/22 0112 07/03/22 0749  AST 62* 58* 38  ALT 39 36 13  ALKPHOS 117 122 103  BILITOT 0.5 0.3 0.4  PROT 6.4* 6.3* 6.1*  ALBUMIN <1.5* <1.5* <1.5*   No results for input(s): "LIPASE", "AMYLASE" in the last 168 hours. CBC: Recent Labs  Lab 06/30/22 0051 06/30/22 1821 07/01/22 0112 07/01/22 1629 07/02/22 0139 07/02/22 1825 07/03/22 0749 07/04/22 0029  WBC 13.7*  --  14.1*  --  12.0*  --  13.8* 12.9*  NEUTROABS  --   --  10.0*  --   --   --  9.3*  --   HGB 7.0*   < > 6.1*   < >  6.1* 7.5* 7.5* 7.8*  HCT 21.8*   < > 19.3*   < > 19.2* 22.4* 23.6* 23.9*  MCV 83.8  --  86.2  --  85.0  --  85.5 84.5  PLT 610*  --  594*  --  556*  --  575* 515*   < > = values in this interval not displayed.   Blood Culture    Component Value Date/Time   SDES BLOOD RIGHT FOREARM 06/27/2022 0802   SPECREQUEST  06/27/2022 0802    BOTTLES DRAWN AEROBIC ONLY Blood Culture results may not be optimal due to an inadequate volume of blood received in culture bottles   CULT  06/27/2022 0802    NO GROWTH 5 DAYS Performed at [facility], [place] [address] [place], [place] [address]    REPTSTATUS 07/02/2022 FINAL 06/27/2022 0802    Cardiac Enzymes: No results for input(s): "CKTOTAL", "CKMB", "CKMBINDEX", "TROPONINI" in the last 168 hours. CBG: Recent Labs  Lab 07/03/22 2303 07/04/22 0107 07/04/22 0556 07/04/22 0722 07/04/22 1037  GLUCAP 95 82 86 85 88   Iron Studies: No results for input(s): "IRON", "TIBC", "TRANSFERRIN", "  FERRITIN" in the last 72 hours. Lab Results  Component Value Date   INR 1.2 07/02/2022   INR 1.3 (H) 06/08/2022   INR 1.1 12/24/2021   Studies/Results: IR Removal Tun Cv Cath W/O FL  Result Date: 07/02/2022 INDICATION: End-stage renal disease on hemodialysis. Now with functional arteriovenous fistula. Request for removal of tunneled hemodialysis catheter. It was initially placed on Dec 25, 2021 by Dr. Dwaine Gale. EXAM: REMOVAL OF TUNNELED HEMODIALYSIS CATHETER MEDICATIONS: 1% lidocaine 4 mL COMPLICATIONS: None immediate. PROCEDURE: Informed written consent was obtained from the patient following an explanation of the procedure, risks, benefits and alternatives to treatment. A time out was performed prior to the initiation of the procedure. Maximal barrier sterile technique was utilized including caps, mask, sterile gowns, sterile gloves, large sterile drape, and hand hygiene. ChloraPrep was used to prep the patient's right neck, chest and existing catheter. The cuff  was palpated about 2 inches above the exit site of the catheter. 1% lidocaine was injected around the cuff of the catheter . Using a #15 blade, a small incision was made over the cuff. The catheter was dissected out using scissors and curved hemostats until the cuff was freed from the surrounding fibrous sheath and the catheter was removed without difficulty. Next 3-0 Vicryl suture was used to approximate on the skin only at the incision site. Sterile dressing was placed. The patient tolerated the procedure well without immediate post procedural complication. IMPRESSION: Successful removal of tunneled dialysis catheter. Procedure performed by: Gareth Eagle, PA-C Electronically Signed   By: Ruthann Cancer M.D.   On: 07/02/2022 16:46    Medications:  ceFEPime (MAXIPIME) IV 1 g (07/04/22 0000)   feeding supplement (NEPRO CARB STEADY) 1,000 mL (07/03/22 1747)   vancomycin Stopped (07/03/22 1126)    sodium chloride   Intravenous Once   Chlorhexidine Gluconate Cloth  6 each Topical Q0600   darbepoetin (ARANESP) injection - DIALYSIS  150 mcg Subcutaneous Q Thu-1800   diclofenac Sodium  4 g Topical Once   famotidine  20 mg Per Tube Daily   feeding supplement (PROSource TF20)  60 mL Per Tube Daily   heparin  5,000 Units Subcutaneous Q8H   leptospermum manuka honey  1 Application Topical Daily   metroNIDAZOLE  500 mg Per Tube BID   multivitamin  1 tablet Per Tube QHS   nutrition supplement (JUVEN)  1 packet Per Tube BID BM   mouth rinse  15 mL Mouth Rinse 4 times per day   sodium hypochlorite   Topical BID   thiamine (VITAMIN B1) injection  100 mg Intravenous Daily    Dialysis Orders: MWF at [facility] 3.5h  48kg  2.2/5 bath  300/600  Hep none R AVG/TDC (AVG ok to use reportedly)   CXR 11/10- bilat splotchy perihilar disease, edema vs infxn, improved compared to 11/8 film  Assessment/Plan: 1.  Resolved acute hypoxic respiratory failure: Now stable on RA. Due to combination of pulmonary  edema/volume overload with bronchospasm along with influenza +/- and probable HCAP.  Previously improving, CXR 11/27 w/ Probable retrocardiac LLL opacity suspicious for airspace disease/pneumonia. CT w/possible LLL PNA, no other acute abnormalities. ABX started. Per PMD.  2. Fever/leukocytosis - Afebrile x 24hrs. WBC trending up today despite ABX. BC w/NGTD.  CXR 11/27 & CT 11/28 w/possible PNA. Foul smelling pressure wounds. Has TDC which always has possibility of infection but BC neg. AVG working well. IR consulted: TDC removed 07/02/22.  3. Altered mental status: per staff at [facility]  HD is not unusual for him not to respond verbally at the OP unit.  4.  ESRD: on HD MWF. Next HD 12/4 with minimal UF. 5. HD access: Has AVG with successful use.  See #2.  Appreciate their assistance.  6.  HTN/ vol: Blood pressure mostly well controlled. Weights labile likely sig under dry wt. BP's not tolerating high UF. Minimal UF with HD.  7.  Secondary hyperparathyroidism: Holding sevelamer as P < 3.0. Calcium in goal.   8.  Anemia of chronic disease: S/p 1 unit PRBCs 12/1 (total 3 units this admission). Aranesp raised  to 165mcg weekly 12/1.  TSAT 12% - iron course started, then held d/t concern for acute infection, will restart when off ABX. Hgb now 7.8. Transfuse for Hgb < 7. 9.  Decubitus wounds - followed by wound care. General surgery consulted. 10. Nutrition - on tube feeds - changed to Nepro.  Plan for PEG tube placement by IR on 12/1.   11.GOC - pt very frail, not interacting very much verbally. Pt has been declining significantly per the palliative care team's assessment and the prognosis remains poor. I do not think that doing more procedures will do much more than prolong his suffering.  Palliative care is working closely with the family, appreciate their efforts.   Cody Poet, NP [facility] 07/04/2022,2:02 PM  LOS: 26 days   Pt seen, examined and agree w assess/plan as above  with additions as indicated.  [facility] 07/04/2022, 3:53 PM

## 2022-07-04 NOTE — Progress Notes (Signed)
Patient ID: Cody Macdonald, male   DOB: [birth year], 61 y.o.   MRN: [id number]      Received request for consultation regarding decubitus ulcers involving perineum, sacral area, and buttocks.  Chart reviewed.  Notes from [facility] nursing reviewed.  Palliative care consultation ongoing.  Goals of care to be established by family and palliative care.  Appear to be moving towards comfort measures.  Would like to examine patient with [place] on Monday.  Please call general surgery service on Monday, 12/4, and coordinate wound evaluation with [place] nursing visit.  Thank you,  tmg  Armandina Gemma, MD [facility] [place] [facility] Office: [phone]

## 2022-07-04 NOTE — Progress Notes (Signed)
Palliative:   Cody Macdonald is lying quietly in bed.  He appears acutely/chronically ill and frail, contracted.  He will briefly speak to me today.  Although bedside nursing staff shares that Cody Macdonald interacts with them, he at times, does not interact with providers.  I believe that he may be able to make his basic needs known.  There is no family at bedside at this time.  Due to his prolonged acute/chronic illness, we will rely on his son, Cody Macdonald, as his healthcare surrogate.   I asked Cody Macdonald how he is doing, and he is noncommittal.  We talk about his wounds, and he in essence, shuts down.  Bedside nursing staff is present and we adjust him in the bed.  Bedside staff states that they have not seen family members present for the last few days.  Face-to-face conference with nephrology, bedside nursing staff and bedside tech related to patient condition, needs, goals of care.  Call to son, Cody Macdonald.  We talk in detail about Cody Macdonald acute and chronic health concerns.  Cody Macdonald is able to tell me that his father had a stroke in April and has lived in a facility since May.  He shares that prior to this hospital stay Cody Macdonald was able to self propel somewhat in a wheelchair, self transfer.  We talk about the marked declines that he is experienced while hospitalized.  Cody Macdonald states that he is discussing goals of care with Cody Macdonald and is respecting what his father does and does not want.  I shared that they are facing some "hard choices" soon.  Cody Macdonald shares that his mother and wife support him in decision making.  Conference with attending, surgery, bedside nursing staff, transition of care team related to patient condition, needs, goals of care, disposition.  Plan: Continue to treat the treatable but no CPR or intubation.  Continues to be agreeable to PEG tube placement.  Understands that surgery consult is pending.  Ultimate goal is to return to long-term care facility.  40 minutes  [name],  NP Palliative Medicine Team  Team phone [phone] Greater than 50%% of this time was spent counseling and coordinating care related to the above assessment and plan.

## 2022-07-04 NOTE — Progress Notes (Addendum)
PROGRESS NOTE    Cody Macdonald  WSF:[id number] DOB: Jun 27, 1961 DOA: 06/08/2022 PCP: Pcp, No  Chief Complaint  Patient presents with   Shortness of Breath    Brief Narrative:  61 year old male with CVA, HTN, ESRD on HD MWF, sent from his SNF due to shortness of breath and hypertensive urgency.  He was hypoxic requiring up to 10 L nasal cannula and had pulmonary edema on the chest x-ray.  He was initially in the ICU, underwent emergent dialysis and required Cleviprex infusion for hypertensive urgency.  With improvement he was transferred to the hospitalist service on 11/10.  Hospital course complicated by poor mentation, persistent encephalopathy and failed Sylvester Minton swallow eval.  Palliative care was consulted and multiple GOC were held, he is now DNR and family wishes for PEG tube.  Hospitalization complicated by fevers and worsening pressure wounds.  Pressure wounds thought to be source of infection.  Fevers currently improved.  PEG was delayed due to fevers and anemia.   Currently pending PEG placement once he's more stable.  I asked surgery to evaluate his pressure wounds.    See below for additional details  Assessment & Plan:   Principal Problem:   HCAP (healthcare-associated pneumonia) Active Problems:   ESRD (end stage renal disease) ([place])   Hypertensive urgency   Influenza Bayyinah Dukeman   Acute respiratory failure with hypoxia (HCC)   Protein-calorie malnutrition, severe  Acute hypoxic respiratory failure due to multifocal pneumonia due to influenza Cynthya Yam as well as superimposed acute pulmonary edema in the setting of fluid overload in Danie Diehl dialysis patient-respiratory status overall improving, currently he is on room air.  He completed Juwon Scripter course of Tamiflu as well as empiric antibiotics, finished Lillionna Nabi 10-day course on 11/17.   Fever Last fever 12/1 CT C/Jaysin Gayler/P with mild LLL airspace opacity (pneumonia vs atelectasis) Had stable lucent lesions in the right iliac bone (if concern for metastatic disease,  consider bone scan) Follow RVP, covid Continue cefepime and vancomycin.  Add flagyl. Cultures 11/26 [id number] Requested surgery to eval wounds, they requested to be notified Monday to coordinate with [place] nursing visit Consider discussion with ortho Suspect fevers related to his pressure wounds.  Fevers have improved while he's been on abx.  MRI unrevealing - though limited.  See images of wounds in chart today 12/3.  Hypertensive Urgency -patient required ICU level of care for Fisher Hargadon Cleviprex drip --- currently BP improved - has labetalol and hydralazine prn    ESRD on HD-management as per nephrology. Tunneled dialysis catheter removed on 12/1   Hypophosphatemia, hypokalemia and hypomagnesemia, hyperkalemia-per renal   Poor p.o. intake -Has been refusing to eat and not really eating very much.  PEG at some point - this was canceled on Friday 12/1 due to his fevers and anemia.   H/o CVA -On Aspirin and Clopidogrel, on dysphagia 1 diet but not eating very well.  Plavix and aspirin being held per IR (will continue to hold with unclear plan for possible peg)   Anemia of Chronic Kidney Disease  Hb gradually downtrending, chronic disease, iron def, follow S/p 3 unit pRBC (inadequate response, but now stable) No obvious bleeding, will watch closely  Holding plavix and aspirin above for now   Hypoalbuminemia -noted   Acute Metabolic Encephalopathy-overall improved but still does not engage much.   Abnormal LFTs -minimal, improving   Underweight/ Severe Malnutrition in the Context of Chronic Illness/cachexia -on tube feeds   Pressure Ulcers, not present on admission See 11/28 note, reevaluation of wounds  to sacrum L foot and L heel.  Has large foul smelling unstageable wounds on the L buttock close to anus, L ischium, coccyx.  7 days of dakins.  Stage 2 to R sacrum.  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  New DTPI to LL leg.  Planning for iodine.  Prevalon boot.  Right iliac crest has DTPI.  Standard size  bed with air mattress.  Due to rapid decline, they're recommending discussions regarding comfort measures.  Palliative following    DVT prophylaxis: heparin Code Status: dnr Family Communication: son 12/1 Disposition:   Status is: Inpatient Remains inpatient appropriate because: continued need for inpatient care   Consultants:  Renal  Palliative IR  Procedures:  12/1 IMPRESSION: Successful removal of tunneled dialysis catheter.  Antimicrobials:  Anti-infectives (From admission, onward)    Start     Dose/Rate Route Frequency Ordered Stop   07/02/22 0800  ceFAZolin (ANCEF) IVPB 2g/100 mL premix        2 g 200 mL/hr over 30 Minutes Intravenous To Radiology 07/02/22 0041 07/03/22 0342   07/02/22 0000  ceFAZolin (ANCEF) IVPB 2g/100 mL premix  Status:  Discontinued        2 g 200 mL/hr over 30 Minutes Intravenous To Radiology 06/28/22 1653 07/02/22 0041   07/01/22 1745  metroNIDAZOLE (FLAGYL) tablet 500 mg        500 mg Per Tube 2 times daily 07/01/22 1727     06/30/22 2000  ceFEPIme (MAXIPIME) 1 g in sodium chloride 0.9 % 100 mL IVPB        1 g 200 mL/hr over 30 Minutes Intravenous Every 24 hours 06/29/22 0517     06/30/22 1200  vancomycin (VANCOREADY) IVPB 500 mg/100 mL        500 mg 100 mL/hr over 60 Minutes Intravenous Every M-W-F (Hemodialysis) 06/29/22 0517     06/29/22 0615  vancomycin (VANCOCIN) IVPB 1000 mg/200 mL premix        1,000 mg 200 mL/hr over 60 Minutes Intravenous  Once 06/29/22 0517 06/29/22 0712   06/29/22 0615  ceFEPIme (MAXIPIME) 2 g in sodium chloride 0.9 % 100 mL IVPB        2 g 200 mL/hr over 30 Minutes Intravenous  Once 06/29/22 0517 06/29/22 0614   06/27/22 1200  ampicillin-sulbactam (UNASYN) 1.5 g in sodium chloride 0.9 % 100 mL IVPB  Status:  Discontinued        1.5 g 200 mL/hr over 30 Minutes Intravenous Every 12 hours 06/27/22 1101 06/29/22 0501   06/15/22 0900  ceFEPIme (MAXIPIME) 2 g in sodium chloride 0.9 % 100 mL IVPB        2 g 200  mL/hr over 30 Minutes Intravenous  Once 06/15/22 0804 06/15/22 1044   06/14/22 1200  ceFEPIme (MAXIPIME) 2 g in sodium chloride 0.9 % 100 mL IVPB  Status:  Discontinued        2 g 200 mL/hr over 30 Minutes Intravenous Every M-W-F (Hemodialysis) 06/14/22 1008 06/18/22 1429   06/14/22 1200  vancomycin (VANCOREADY) IVPB 500 mg/100 mL  Status:  Discontinued        500 mg 100 mL/hr over 60 Minutes Intravenous Every M-W-F (Hemodialysis) 06/14/22 1008 06/18/22 1429   06/14/22 1100  vancomycin (VANCOCIN) IVPB 1000 mg/200 mL premix        1,000 mg 200 mL/hr over 60 Minutes Intravenous  Once 06/14/22 1000 06/14/22 1600   06/14/22 1100  ceFEPIme (MAXIPIME) 2 g in sodium chloride 0.9 % 100 mL  IVPB        2 g 200 mL/hr over 30 Minutes Intravenous  Once 06/14/22 1000 06/14/22 1259   06/12/22 2000  oseltamivir (TAMIFLU) capsule 30 mg        30 mg Oral  Once 06/12/22 1440 06/12/22 2149   06/10/22 2100  oseltamivir (TAMIFLU) capsule 30 mg        30 mg Oral  Once 06/10/22 0825 06/11/22 0517   06/10/22 1030  cefTRIAXone (ROCEPHIN) 2 g in sodium chloride 0.9 % 100 mL IVPB  Status:  Discontinued        2 g 200 mL/hr over 30 Minutes Intravenous Every 24 hours 06/10/22 0945 06/14/22 1000   06/09/22 2200  ceFEPIme (MAXIPIME) 1 g in sodium chloride 0.9 % 100 mL IVPB  Status:  Discontinued        1 g 200 mL/hr over 30 Minutes Intravenous Every 24 hours 06/08/22 1611 06/10/22 0945   06/09/22 1015  oseltamivir (TAMIFLU) capsule 30 mg        30 mg Oral  Once 06/09/22 0920 06/09/22 1045   06/08/22 1830  oseltamivir (TAMIFLU) capsule 30 mg  Status:  Discontinued        30 mg Oral  Once 06/08/22 1818 06/09/22 0920   06/08/22 1612  vancomycin variable dose per unstable renal function (pharmacist dosing)  Status:  Discontinued         Does not apply See admin instructions 06/08/22 1612 06/09/22 0920   06/08/22 1100  vancomycin (VANCOCIN) IVPB 1000 mg/200 mL premix        1,000 mg 200 mL/hr over 60 Minutes Intravenous   Once 06/08/22 1057 06/08/22 1329   06/08/22 1100  ceFEPIme (MAXIPIME) 2 g in sodium chloride 0.9 % 100 mL IVPB        2 g 200 mL/hr over 30 Minutes Intravenous  Once 06/08/22 1057 06/08/22 1225       Subjective: No complaints today Uncomfortable as we move him  Objective: Vitals:   07/04/22 0724 07/04/22 1035 07/04/22 1531 07/04/22 1707  BP: (!) 136/90 (!) 140/71 (!) 153/74 (!) 145/72  Pulse: (!) 112 (!) 106 (!) 114 (!) 110  Resp: 17 16 18 20   Temp: 98.6 F (37 C) 99.1 F (37.3 C) 99.3 F (37.4 C)   TempSrc: Oral Oral Oral   SpO2: 96% 98% 95% 96%  Weight:      Height:        Intake/Output Summary (Last 24 hours) at 07/04/2022 2015 Last data filed at 07/04/2022 8295 Gross per 24 hour  Intake 130 ml  Output --  Net 130 ml   Filed Weights   07/03/22 0726 07/03/22 1139 07/04/22 0013  Weight: 85 kg 85 kg 85 kg    Examination:  General: No acute distress. Cardiovascular: RRR Lungs: unlabored Abdomen: Soft, nontender, nondistended Neurological: alert, contractures Skin: see documented photos Extremities: No clubbing or cyanosis. No edema.  Data Reviewed: I have personally reviewed following labs and imaging studies  CBC: Recent Labs  Lab 06/30/22 0051 06/30/22 1821 07/01/22 0112 07/01/22 1629 07/02/22 0139 07/02/22 1825 07/03/22 0749 07/04/22 0029  WBC 13.7*  --  14.1*  --  12.0*  --  13.8* 12.9*  NEUTROABS  --   --  10.0*  --   --   --  9.3*  --   HGB 7.0*   < > 6.1* 6.5* 6.1* 7.5* 7.5* 7.8*  HCT 21.8*   < > 19.3* 20.3* 19.2* 22.4* 23.6* 23.9*  MCV  83.8  --  86.2  --  85.0  --  85.5 84.5  PLT 610*  --  594*  --  556*  --  575* 515*   < > = values in this interval not displayed.    Basic Metabolic Panel: Recent Labs  Lab 06/30/22 0051 07/01/22 0112 07/02/22 0139 07/03/22 0749 07/04/22 0029  NA 130* 135 135 136 137  K 5.8* 4.9 4.9 4.9 4.3  CL 92* 94* 91* 95* 95*  CO2 23 25 27 26 29   GLUCOSE 96 97 97 101* 84  BUN 90* 55* 81* 118* 69*   CREATININE 5.00* 3.36* 4.43* 5.79* 3.11*  CALCIUM 8.1* 8.6* 8.4* 9.0 9.1  MG 2.6* 2.2  --  2.7* 2.2  PHOS  --  3.7  --  5.2* 3.5    GFR: Estimated Creatinine Clearance: 26.8 mL/min (Gini Caputo) (by C-G formula based on SCr of 3.11 mg/dL (H)).  Liver Function Tests: Recent Labs  Lab 06/30/22 0051 07/01/22 0112 07/03/22 0749  AST 62* 58* 38  ALT 39 36 13  ALKPHOS 117 122 103  BILITOT 0.5 0.3 0.4  PROT 6.4* 6.3* 6.1*  ALBUMIN <1.5* <1.5* <1.5*    CBG: Recent Labs  Lab 07/04/22 0107 07/04/22 0556 07/04/22 0722 07/04/22 1037 07/04/22 1538  GLUCAP 82 86 85 88 104*     Recent Results (from the past 240 hour(s))  Culture, blood (Routine X 2) w Reflex to ID Panel     Status: None   Collection Time: 06/27/22  7:51 AM   Specimen: BLOOD  Result Value Ref Range Status   Specimen Description BLOOD RIGHT ANTECUBITAL  Final   Special Requests   Final    BOTTLES DRAWN AEROBIC AND ANAEROBIC Blood Culture results may not be optimal due to an inadequate volume of blood received in culture bottles   Culture   Final    NO GROWTH 5 DAYS Performed at [facility], [place] [address] [place], [place] [address]    Report Status 07/02/2022 FINAL  Final  Culture, blood (Routine X 2) w Reflex to ID Panel     Status: None   Collection Time: 06/27/22  8:02 AM   Specimen: BLOOD RIGHT FOREARM  Result Value Ref Range Status   Specimen Description BLOOD RIGHT FOREARM  Final   Special Requests   Final    BOTTLES DRAWN AEROBIC ONLY Blood Culture results may not be optimal due to an inadequate volume of blood received in culture bottles   Culture   Final    NO GROWTH 5 DAYS Performed at [facility], [place] [address] [place], [place] [address]    Report Status 07/02/2022 FINAL  Final  Respiratory (~20 pathogens) panel by PCR     Status: None   Collection Time: 06/30/22  8:10 AM   Specimen: Nasopharyngeal Swab; Respiratory  Result Value Ref Range Status   Adenovirus NOT DETECTED NOT  DETECTED Final   Coronavirus 229E NOT DETECTED NOT DETECTED Final    Comment: (NOTE) The Coronavirus on the Respiratory Panel, DOES NOT test for the novel  Coronavirus (2019 nCoV)    Coronavirus HKU1 NOT DETECTED NOT DETECTED Final   Coronavirus NL63 NOT DETECTED NOT DETECTED Final   Coronavirus OC43 NOT DETECTED NOT DETECTED Final   Metapneumovirus NOT DETECTED NOT DETECTED Final   Rhinovirus / Enterovirus NOT DETECTED NOT DETECTED Final   Influenza Tiger Spieker NOT DETECTED NOT DETECTED Final   Influenza B NOT DETECTED NOT DETECTED Final  Parainfluenza Virus 1 NOT DETECTED NOT DETECTED Final   Parainfluenza Virus 2 NOT DETECTED NOT DETECTED Final   Parainfluenza Virus 3 NOT DETECTED NOT DETECTED Final   Parainfluenza Virus 4 NOT DETECTED NOT DETECTED Final   Respiratory Syncytial Virus NOT DETECTED NOT DETECTED Final   Bordetella pertussis NOT DETECTED NOT DETECTED Final   Bordetella Parapertussis NOT DETECTED NOT DETECTED Final   Chlamydophila pneumoniae NOT DETECTED NOT DETECTED Final   Mycoplasma pneumoniae NOT DETECTED NOT DETECTED Final    Comment: Performed at [facility], [place] [address], [place], [place] [address]  SARS Coronavirus 2 by RT PCR (hospital order, performed in [facility] hospital lab) *cepheid single result test* Anterior Nasal Swab     Status: None   Collection Time: 06/30/22  8:10 AM   Specimen: Anterior Nasal Swab  Result Value Ref Range Status   SARS Coronavirus 2 by RT PCR NEGATIVE NEGATIVE Final    Comment: (NOTE) SARS-CoV-2 target nucleic acids are NOT DETECTED.  The SARS-CoV-2 RNA is generally detectable in upper and lower respiratory specimens during the acute phase of infection. The lowest concentration of SARS-CoV-2 viral copies this assay can detect is 250 copies / mL. Otho Michalik negative result does not preclude SARS-CoV-2 infection and should not be used as the sole basis for treatment or other patient management decisions.  Beonka Amesquita negative result may occur  with improper specimen collection / handling, submission of specimen other than nasopharyngeal swab, presence of viral mutation(s) within the areas targeted by this assay, and inadequate number of viral copies (<250 copies / mL). Gevorg Brum negative result must be combined with clinical observations, patient history, and epidemiological information.  Fact Sheet for Patients:   https://www.patel.info/  Fact Sheet for Healthcare Providers: https://hall.com/  This test is not yet approved or  cleared by the [place] FDA and has been authorized for detection and/or diagnosis of SARS-CoV-2 by FDA under an Emergency Use Authorization (EUA).  This EUA will remain in effect (meaning this test can be used) for the duration of the COVID-19 declaration under Section 564(b)(1) of the Act, 21 U.S.C. section 360bbb-3(b)(1), unless the authorization is terminated or revoked sooner.  Performed at [facility], [place] [address] [place], [place] [address]          Radiology Studies: No results found.      Scheduled Meds:  sodium chloride   Intravenous Once   Chlorhexidine Gluconate Cloth  6 each Topical Q0600   darbepoetin (ARANESP) injection - DIALYSIS  150 mcg Subcutaneous Q Thu-1800   diclofenac Sodium  4 g Topical Once   famotidine  20 mg Per Tube Daily   feeding supplement (PROSource TF20)  60 mL Per Tube Daily   heparin  5,000 Units Subcutaneous Q8H   leptospermum manuka honey  1 Application Topical Daily   metroNIDAZOLE  500 mg Per Tube BID   multivitamin  1 tablet Per Tube QHS   nutrition supplement (JUVEN)  1 packet Per Tube BID BM   mouth rinse  15 mL Mouth Rinse 4 times per day   sodium hypochlorite   Topical BID   thiamine (VITAMIN B1) injection  100 mg Intravenous Daily   Continuous Infusions:  ceFEPime (MAXIPIME) IV 1 g (07/04/22 0000)   feeding supplement (NEPRO CARB STEADY) 1,000 mL (07/03/22 1747)   vancomycin Stopped  (07/03/22 1126)     LOS: 26 days    Time spent: over 30 min    Fayrene Helper, MD [facility]  To contact the attending provider between 7A-7P or the covering provider during after hours 7P-7A, please log into the web site [email or web address] and access using universal [place] password for that web site. If you do not have the password, please call the hospital operator.  07/04/2022, 8:15 PM

## 2022-07-05 DIAGNOSIS — Z7189 Other specified counseling: Secondary | ICD-10-CM | POA: Diagnosis not present

## 2022-07-05 DIAGNOSIS — Z515 Encounter for palliative care: Secondary | ICD-10-CM | POA: Diagnosis not present

## 2022-07-05 DIAGNOSIS — J189 Pneumonia, unspecified organism: Secondary | ICD-10-CM | POA: Diagnosis not present

## 2022-07-05 DIAGNOSIS — E43 Unspecified severe protein-calorie malnutrition: Secondary | ICD-10-CM | POA: Diagnosis not present

## 2022-07-05 LAB — RENAL FUNCTION PANEL
Albumin: <1.5 g/dL — ABNORMAL LOW (ref 3.5–5.0)
Anion gap: 15 (ref 5–15)
BUN: 107 mg/dL — ABNORMAL HIGH (ref 6–20)
CO2: 26 mmol/L (ref 22–32)
Calcium: 9.3 mg/dL (ref 8.9–10.3)
Chloride: 94 mmol/L — ABNORMAL LOW (ref 98–111)
Creatinine, Ser: 4.38 mg/dL — ABNORMAL HIGH (ref 0.61–1.24)
GFR, Estimated: 15 mL/min — ABNORMAL LOW (ref >60)
Glucose, Bld: 93 mg/dL (ref 70–99)
Phosphorus: 3.8 mg/dL (ref 2.5–4.6)
Potassium: 4.2 mmol/L (ref 3.5–5.1)
Sodium: 135 mmol/L (ref 135–145)

## 2022-07-05 LAB — CBC WITH DIFFERENTIAL/PLATELET
Abs Immature Granulocytes: 0.11 10*3/uL — ABNORMAL HIGH (ref 0.00–0.07)
Basophils Absolute: 0.1 10*3/uL (ref 0.0–0.1)
Basophils Relative: 1 %
Eosinophils Absolute: 0.9 10*3/uL — ABNORMAL HIGH (ref 0.0–0.5)
Eosinophils Relative: 7 %
HCT: 24.1 % — ABNORMAL LOW (ref 39.0–52.0)
Hemoglobin: 7.9 g/dL — ABNORMAL LOW (ref 13.0–17.0)
Immature Granulocytes: 1 %
Lymphocytes Relative: 19 %
Lymphs Abs: 2.5 10*3/uL (ref 0.7–4.0)
MCH: 27.7 pg (ref 26.0–34.0)
MCHC: 32.8 g/dL (ref 30.0–36.0)
MCV: 84.6 fL (ref 80.0–100.0)
Monocytes Absolute: 1.4 10*3/uL — ABNORMAL HIGH (ref 0.1–1.0)
Monocytes Relative: 11 %
Neutro Abs: 7.9 10*3/uL — ABNORMAL HIGH (ref 1.7–7.7)
Neutrophils Relative %: 61 %
Platelets: 511 10*3/uL — ABNORMAL HIGH (ref 150–400)
RBC: 2.85 MIL/uL — ABNORMAL LOW (ref 4.22–5.81)
RDW: 15.9 % — ABNORMAL HIGH (ref 11.5–15.5)
WBC: 12.9 10*3/uL — ABNORMAL HIGH (ref 4.0–10.5)
nRBC: 0 % (ref 0.0–0.2)

## 2022-07-05 LAB — GLUCOSE, CAPILLARY
Glucose-Capillary: 107 mg/dL — ABNORMAL HIGH (ref 70–99)
Glucose-Capillary: 108 mg/dL — ABNORMAL HIGH (ref 70–99)
Glucose-Capillary: 85 mg/dL (ref 70–99)
Glucose-Capillary: 89 mg/dL (ref 70–99)
Glucose-Capillary: 95 mg/dL (ref 70–99)
Glucose-Capillary: 96 mg/dL (ref 70–99)

## 2022-07-05 LAB — VANCOMYCIN, RANDOM: Vancomycin Rm: 9 ug/mL

## 2022-07-05 MED ORDER — BANATROL TF EN LIQD
60.0000 mL | Freq: Two times a day (BID) | ENTERAL | Status: DC
  Administered 2022-07-05 – 2022-07-08 (×4): 60 mL
  Filled 2022-07-05 (×4): qty 60

## 2022-07-05 MED ORDER — PROSOURCE TF20 ENFIT COMPATIBL EN LIQD
60.0000 mL | Freq: Three times a day (TID) | ENTERAL | Status: DC
  Administered 2022-07-05 – 2022-07-08 (×6): 60 mL
  Filled 2022-07-05 (×5): qty 60

## 2022-07-05 MED ORDER — THIAMINE MONONITRATE 100 MG PO TABS
100.0000 mg | ORAL_TABLET | Freq: Every day | ORAL | Status: DC
  Administered 2022-07-06 – 2022-07-08 (×2): 100 mg
  Filled 2022-07-05 (×3): qty 1

## 2022-07-05 MED ORDER — NEPRO/CARBSTEADY PO LIQD
1000.0000 mL | ORAL | Status: DC
  Administered 2022-07-05 – 2022-07-08 (×3): 1000 mL
  Filled 2022-07-05 (×3): qty 1000

## 2022-07-05 MED ORDER — PENTAFLUOROPROP-TETRAFLUOROETH EX AERO: 1.0000 | INHALATION_SPRAY | CUTANEOUS | Status: DC | PRN

## 2022-07-05 MED ORDER — LIDOCAINE HCL (PF) 1 % IJ SOLN: 5.0000 mL | INTRAMUSCULAR | Status: DC | PRN

## 2022-07-05 MED ORDER — LIDOCAINE-PRILOCAINE 2.5-2.5 % EX CREA: 1.0000 | TOPICAL_CREAM | CUTANEOUS | Status: DC | PRN

## 2022-07-05 MED ORDER — OXYCODONE HCL 5 MG/5ML PO SOLN: 5.0000 mg | Freq: Four times a day (QID) | ORAL | Status: DC | PRN

## 2022-07-05 MED ORDER — VANCOMYCIN HCL 750 MG/150ML IV SOLN
750.0000 mg | INTRAVENOUS | Status: DC
  Administered 2022-07-05: 750 mg via INTRAVENOUS
  Filled 2022-07-05 (×2): qty 150

## 2022-07-05 NOTE — Progress Notes (Signed)
PROGRESS NOTE    Cody Macdonald  FGH:[id number] DOB: [birth year] DOA: 06/08/2022 PCP: Pcp, No    Brief Narrative:  61 year old male with CVA, HTN, ESRD on HD MWF, sent from his SNF due to shortness of breath and hypertensive urgency.  He was hypoxic requiring up to 10 L nasal cannula and had pulmonary edema on the chest x-ray.  He was initially in the ICU, underwent emergent dialysis and required Cleviprex infusion for hypertensive urgency.  With improvement he was transferred to the hospitalist service on 11/10.  Hospital course complicated by poor mentation, persistent encephalopathy and failed swallow eval.  Palliative care was consulted and multiple GOC were held, he is now DNR and family wishes for PEG tube.   Hospitalization complicated by fevers and worsening pressure wounds.  Pressure wounds thought to be source of infection.  Fevers currently improved.  PEG was delayed due to fevers and anemia.    Assessment & Plan:   Acute hypoxic respiratory failure due to multifocal pneumonia due to influenza A as well as superimposed acute pulmonary edema in the setting of fluid overload in a dialysis patient Respiratory status overall improving, currently he is on room air.  He completed a course of Tamiflu as well as empiric antibiotics, finished a 10-day course on 11/17.  With recurrence of fever, investigations including CT chest on pelvis with mild left lower lobe airspace opacity that is pneumonia versus atelectasis stable lucent lesion on the right iliac bone On third round of antibiotic therapy and currently remains on cefepime and vancomycin and Flagyl. We will continue antibiotics until PEG tube placement next few days. MRI pelvis 12/1 without any evidence of osteomyelitis or underlying collection. MRI left foot with soft tissue ulcers, no osseous changes to suggest osteomyelitis or soft tissue abscesses.  Hypertensive Urgency -patient required ICU level of care for a Cleviprex drip  --- currently BP improved - has labetalol and hydralazine prn    ESRD on HD-management as per nephrology. Tunneled dialysis catheter removed on 12/1.  Now getting dialysis through AV graft.   Hypophosphatemia, hypokalemia and hypomagnesemia, hyperkalemia-per renal.  Adequate.   Poor p.o. intake -Has been refusing to eat and not really eating very much.  PEG tube was postponed, will ask IR to reconsider PEG tube as patient and family wishes to continue.   H/o CVA -On Aspirin and Clopidogrel, on dysphagia 1 diet but not eating very well.  Plavix and aspirin being held per IR (will continue to hold with unclear plan for possible peg)   Anemia of Chronic Kidney Disease  Hb gradually downtrending, chronic disease, iron def, follow S/p 3 unit pRBC (inadequate response, but now stable) No obvious bleeding, will watch closely  Holding plavix and aspirin above for now   Hypoalbuminemia -noted   Acute Metabolic Encephalopathy-overall improved but still does not engage much.   Abnormal LFTs -minimal, improving   Underweight/ Severe Malnutrition in the Context of Chronic Illness/cachexia -on tube feeds   Pressure Ulcers, not present on admission See 11/28 note, reevaluation of wounds to sacrum L foot and L heel.  Has large foul smelling unstageable wounds on the L buttock close to anus, L ischium, coccyx.  . Stage 2 to R sacrum.  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  New DTPI to LL leg.  Planning for iodine.  Prevalon boot.  Right iliac crest has DTPI.  Standard size bed with air mattress.  Due to rapid decline, they're recommending discussions regarding comfort measures.  Palliative following.  Seen by surgery today.  Recommended conservative management, local wound care and hydrotherapy. Surgical debridement is not going to be beneficial as patient is bedbound and there will be no any other intervention to heal the wound.  Goal of care: Severe medical debility.  Bedbound.  Contractures.  Multiple  pressure ulcers not amenable to surgery or healing.  Frailty and debility failure to thrive.  Comfort care and hospice appropriate. Multiple goal of care discussions, palliative care is involved. DNR/DNI. Family wished for PEG tube feeding, IR on board. Back to nursing home after PEG tube placement.   DVT prophylaxis: heparin injection 5,000 Units Start: 07/03/22 0600 SCDs Start: 06/08/22 1543   Code Status: DNR Family Communication: None at the bedside. Disposition Plan: Status is: Inpatient Remains inpatient appropriate because: Unable to eat     Consultants:  Critical care General surgery Palliative  Procedures:  Multiple procedures as above.  Antimicrobials:  Multiple rounds of antibiotics.  Currently on cefepime vancomycin and Flagyl   Subjective: Patient seen and examined.  Came back from dialysis.  Does not respond.  Spastic extremities.  resists movement.  Objective: Vitals:   07/05/22 1231 07/05/22 1252 07/05/22 1400 07/05/22 1412  BP: 132/83 132/72 (!) 150/80   Pulse: (!) 109 (!) 109 (!) 113   Resp: (!) 29 (!) 31 (!) 28 (!) 28  Temp:  98.1 F (36.7 C)  97.8 F (36.6 C)  TempSrc:  Axillary  Oral  SpO2: 99% 96% 98%   Weight:  83 kg    Height:        Intake/Output Summary (Last 24 hours) at 07/05/2022 1427 Last data filed at 07/05/2022 1252 Gross per 24 hour  Intake --  Output -0.1 ml  Net 0.1 ml   Filed Weights   07/05/22 0001 07/05/22 0900 07/05/22 1252  Weight: 85 kg 84 kg 83 kg    Examination:  General: Sick looking, cachectic, functional paraplegia, contracted extremities. Cardiovascular: S1-S2 normal.  Tachycardic. Respiratory: On room air.  Mildly tachypneic after procedure. Gastrointestinal: Nontender.  Soft.  Some voluntary guarding present. Ext: Contracted extremities.  Multiple decubitus ulcers as noted. Neuro: Alert, lethargic, does not follow any commands.  Does not have any spontaneous movements.     Data Reviewed: I have  personally reviewed following labs and imaging studies  CBC: Recent Labs  Lab 07/01/22 0112 07/01/22 1629 07/02/22 0139 07/02/22 1825 07/03/22 0749 07/04/22 0029 07/05/22 0049  WBC 14.1*  --  12.0*  --  13.8* 12.9* 12.9*  NEUTROABS 10.0*  --   --   --  9.3*  --  7.9*  HGB 6.1*   < > 6.1* 7.5* 7.5* 7.8* 7.9*  HCT 19.3*   < > 19.2* 22.4* 23.6* 23.9* 24.1*  MCV 86.2  --  85.0  --  85.5 84.5 84.6  PLT 594*  --  556*  --  575* 515* 511*   < > = values in this interval not displayed.   Basic Metabolic Panel: Recent Labs  Lab 06/30/22 0051 07/01/22 0112 07/02/22 0139 07/03/22 0749 07/04/22 0029 07/05/22 0049  NA 130* 135 135 136 137 135  K 5.8* 4.9 4.9 4.9 4.3 4.2  CL 92* 94* 91* 95* 95* 94*  CO2 23 25 27 26 29 26   GLUCOSE 96 97 97 101* 84 93  BUN 90* 55* 81* 118* 69* 107*  CREATININE 5.00* 3.36* 4.43* 5.79* 3.11* 4.38*  CALCIUM 8.1* 8.6* 8.4* 9.0 9.1 9.3  MG 2.6* 2.2  --  2.7* 2.2  --  PHOS  --  3.7  --  5.2* 3.5 3.8   GFR: Estimated Creatinine Clearance: 18.8 mL/min (A) (by C-G formula based on SCr of 4.38 mg/dL (H)). Liver Function Tests: Recent Labs  Lab 06/30/22 0051 07/01/22 0112 07/03/22 0749 07/05/22 0049  AST 62* 58* 38  --   ALT 39 36 13  --   ALKPHOS 117 122 103  --   BILITOT 0.5 0.3 0.4  --   PROT 6.4* 6.3* 6.1*  --   ALBUMIN <1.5* <1.5* <1.5* <1.5*   No results for input(s): "LIPASE", "AMYLASE" in the last 168 hours. No results for input(s): "AMMONIA" in the last 168 hours. Coagulation Profile: Recent Labs  Lab 07/02/22 0139  INR 1.2   Cardiac Enzymes: No results for input(s): "CKTOTAL", "CKMB", "CKMBINDEX", "TROPONINI" in the last 168 hours. BNP (last 3 results) No results for input(s): "PROBNP" in the last 8760 hours. HbA1C: No results for input(s): "HGBA1C" in the last 72 hours. CBG: Recent Labs  Lab 07/04/22 2057 07/05/22 0005 07/05/22 0600 07/05/22 0717 07/05/22 1357  GLUCAP 95 95 107* 96 85   Lipid Profile: No results for  input(s): "CHOL", "HDL", "LDLCALC", "TRIG", "CHOLHDL", "LDLDIRECT" in the last 72 hours. Thyroid Function Tests: No results for input(s): "TSH", "T4TOTAL", "FREET4", "T3FREE", "THYROIDAB" in the last 72 hours. Anemia Panel: No results for input(s): "VITAMINB12", "FOLATE", "FERRITIN", "TIBC", "IRON", "RETICCTPCT" in the last 72 hours. Sepsis Labs: No results for input(s): "PROCALCITON", "LATICACIDVEN" in the last 168 hours.  Recent Results (from the past 240 hour(s))  Culture, blood (Routine X 2) w Reflex to ID Panel     Status: None   Collection Time: 06/27/22  7:51 AM   Specimen: BLOOD  Result Value Ref Range Status   Specimen Description BLOOD RIGHT ANTECUBITAL  Final   Special Requests   Final    BOTTLES DRAWN AEROBIC AND ANAEROBIC Blood Culture results may not be optimal due to an inadequate volume of blood received in culture bottles   Culture   Final    NO GROWTH 5 DAYS Performed at [facility], [place] [address], [place], [place] [address]    Report Status 07/02/2022 FINAL  Final  Culture, blood (Routine X 2) w Reflex to ID Panel     Status: None   Collection Time: 06/27/22  8:02 AM   Specimen: BLOOD RIGHT FOREARM  Result Value Ref Range Status   Specimen Description BLOOD RIGHT FOREARM  Final   Special Requests   Final    BOTTLES DRAWN AEROBIC ONLY Blood Culture results may not be optimal due to an inadequate volume of blood received in culture bottles   Culture   Final    NO GROWTH 5 DAYS Performed at [facility], [place] [address], [place], [place] [address]    Report Status 07/02/2022 FINAL  Final  Respiratory (~20 pathogens) panel by PCR     Status: None   Collection Time: 06/30/22  8:10 AM   Specimen: Nasopharyngeal Swab; Respiratory  Result Value Ref Range Status   Adenovirus NOT DETECTED NOT DETECTED Final   Coronavirus 229E NOT DETECTED NOT DETECTED Final    Comment: (NOTE) The Coronavirus on the Respiratory Panel, DOES NOT test for the novel   Coronavirus (2019 nCoV)    Coronavirus HKU1 NOT DETECTED NOT DETECTED Final   Coronavirus NL63 NOT DETECTED NOT DETECTED Final   Coronavirus OC43 NOT DETECTED NOT DETECTED Final   Metapneumovirus NOT DETECTED NOT DETECTED Final   Rhinovirus /  Enterovirus NOT DETECTED NOT DETECTED Final   Influenza A NOT DETECTED NOT DETECTED Final   Influenza B NOT DETECTED NOT DETECTED Final   Parainfluenza Virus 1 NOT DETECTED NOT DETECTED Final   Parainfluenza Virus 2 NOT DETECTED NOT DETECTED Final   Parainfluenza Virus 3 NOT DETECTED NOT DETECTED Final   Parainfluenza Virus 4 NOT DETECTED NOT DETECTED Final   Respiratory Syncytial Virus NOT DETECTED NOT DETECTED Final   Bordetella pertussis NOT DETECTED NOT DETECTED Final   Bordetella Parapertussis NOT DETECTED NOT DETECTED Final   Chlamydophila pneumoniae NOT DETECTED NOT DETECTED Final   Mycoplasma pneumoniae NOT DETECTED NOT DETECTED Final    Comment: Performed at [facility], [place] [address] [place], [place] [address]  SARS Coronavirus 2 by RT PCR (hospital order, performed in [facility] hospital lab) *cepheid single result test* Anterior Nasal Swab     Status: None   Collection Time: 06/30/22  8:10 AM   Specimen: Anterior Nasal Swab  Result Value Ref Range Status   SARS Coronavirus 2 by RT PCR NEGATIVE NEGATIVE Final    Comment: (NOTE) SARS-CoV-2 target nucleic acids are NOT DETECTED.  The SARS-CoV-2 RNA is generally detectable in upper and lower respiratory specimens during the acute phase of infection. The lowest concentration of SARS-CoV-2 viral copies this assay can detect is 250 copies / mL. A negative result does not preclude SARS-CoV-2 infection and should not be used as the sole basis for treatment or other patient management decisions.  A negative result may occur with improper specimen collection / handling, submission of specimen other than nasopharyngeal swab, presence of viral mutation(s) within the areas targeted  by this assay, and inadequate number of viral copies (<250 copies / mL). A negative result must be combined with clinical observations, patient history, and epidemiological information.  Fact Sheet for Patients:   https://www.patel.info/  Fact Sheet for Healthcare Providers: https://hall.com/  This test is not yet approved or  cleared by the [place] FDA and has been authorized for detection and/or diagnosis of SARS-CoV-2 by FDA under an Emergency Use Authorization (EUA).  This EUA will remain in effect (meaning this test can be used) for the duration of the COVID-19 declaration under Section 564(b)(1) of the Act, 21 U.S.C. section 360bbb-3(b)(1), unless the authorization is terminated or revoked sooner.  Performed at [facility], [place] [address], [place], [place] [address]          Radiology Studies: No results found.      Scheduled Meds:  Chlorhexidine Gluconate Cloth  6 each Topical Q0600   darbepoetin (ARANESP) injection - DIALYSIS  150 mcg Subcutaneous Q Thu-1800   famotidine  20 mg Per Tube Daily   feeding supplement (PROSource TF20)  60 mL Per Tube Daily   heparin  5,000 Units Subcutaneous Q8H   leptospermum manuka honey  1 Application Topical Daily   metroNIDAZOLE  500 mg Per Tube BID   multivitamin  1 tablet Per Tube QHS   nutrition supplement (JUVEN)  1 packet Per Tube BID BM   mouth rinse  15 mL Mouth Rinse 4 times per day   sodium hypochlorite   Topical BID   [START ON 07/06/2022] thiamine  100 mg Per Tube Daily   Continuous Infusions:  ceFEPime (MAXIPIME) IV 1 g (07/04/22 2237)   feeding supplement (NEPRO CARB STEADY) 1,000 mL (07/03/22 1747)   vancomycin 750 mg (07/05/22 1137)     LOS: 27 days    Time spent: 35  minutes    Barb Merino, MD [facility] Pager [phone]

## 2022-07-05 NOTE — Progress Notes (Signed)
Pharmacy Antibiotic Note  Cody Macdonald is a 61 y.o. male admitted on 06/08/2022 on antibiotics for suspected pneumonia.  Patient has intermittently been on antibiotics since admission. Antibiotics escalated on 11/28 with concern for infection given spiking temp over night despite Unasyn. Currently on day #7 Vancomycin and Cefepime and day # 5 Flagyl per tube. Pharmacy has been consulted for Vancomycin and Cefepime dosing.   Pre-dialysis Vanc level is 9 mcg/ml on Vanc 500 mg IV MWF after HD. Goal is 15-25 mcg/ml  Plan: Increase Vancomycin to 750 mg IV MWF after HD. Continue Cefepime 1gm IV q24h - dosing in pm, to be given after HD on HD days Continues Metronidazole 500 mg per tube BID Topical wound care and hydrotherapy per surgery evaluation. Will follow up clinical progress and antibiotic plans.  Height: 5\' 8"  (172.7 cm) Weight: 84 kg (185 lb 3 oz) IBW/kg (Calculated) : 68.4  Temp (24hrs), Avg:98.5 F (36.9 C), Min:98.1 F (36.7 C), Max:99.3 F (37.4 C)  Recent Labs  Lab 07/01/22 0112 07/02/22 0139 07/03/22 0749 07/04/22 0029 07/05/22 0049 07/05/22 0957  WBC 14.1* 12.0* 13.8* 12.9* 12.9*  --   CREATININE 3.36* 4.43* 5.79* 3.11* 4.38*  --   VANCORANDOM  --   --   --   --   --  9    Estimated Creatinine Clearance: 18.9 mL/min (A) (by C-G formula based on SCr of 4.38 mg/dL (H)).    No Known Allergies  Antimicrobials this admission: Vancomycin 11/7>>11/8, 11/13 >> 11/17; 11/28>> Cefepime 11/7>>11/9, 11/13 >> 11/17; 11/28>> Ceftriaxone 11/9 >> 11/12 Tamiflu 11/7 >>11/11 Unasyn 11/26>>11/27 Metronidazole per tube 11/30 >> Cefazolin x 1 on 12/1 Medihoney to sacral wound daily 11/20 >> Dakins to buttocks wounds 11/28>>12/4  Dose adjustments this admission: 11/17: pre-dialysis vanc level = 16 mcg/ml - continued Vanc 500 mg after HD - course ended 11/17 - new Vanc course 11/28: 12/4: pre-dialysis vancomycin level = 9 mcg/ml.  Dose incr 500>750 mg   Microbiology  results: 11/7  Flu A: positive  11/7 COVID: negative 11/7  MRSA PCR: negative 11/7 blood: negative 11/13 blood: negative 11/26 blood: negative 11/29 COVID and respiratory panel: negative  Thank you for allowing pharmacy to be a part of this patient's care.  Arty Baumgartner, [place] 07/05/2022 10:46 AM

## 2022-07-05 NOTE — Progress Notes (Signed)
Physical Therapy Wound Treatment Patient Details  Name: Cody Macdonald MRN: [id number] Date of Birth: 09/22/1960  Today's Date: 07/05/2022 Time: 4098-1191 Time Calculation (min): 64 min  Subjective  Subjective Assessment Patient and Family Stated Goals: None stated Prior Treatments: dressing changes  Pain Score:  Facial Pain Score 4/10 [place] with rolling side to side   Wound Assessment  Pressure Injury 06/21/22 Coccyx Mid Deep Tissue Pressure Injury - Purple or maroon localized area of discolored intact skin or blood-filled blister due to damage of underlying soft tissue from pressure and/or shear. (Active)  Wound Image   07/05/22 1700  Dressing Type Foam - Lift dressing to assess site every shift;Gauze (Comment);Honey 07/05/22 1700  Dressing Changed 07/05/22 1700  Dressing Change Frequency Twice a day 07/05/22 1700  State of Healing Eschar 07/05/22 1700  Site / Wound Assessment Black;Brown 07/05/22 1700  % Wound base Black/Eschar 100% 07/05/22 1700  Peri-wound Assessment Erythema (non-blanchable);Denuded;Pink 07/05/22 1700  Wound Length (cm) 7.2 cm 07/05/22 1700  Wound Width (cm) 7.4 cm 07/05/22 1700  Wound Surface Area (cm^2) 53.28 cm^2 07/05/22 1700  Margins Unattached edges (unapproximated) 07/05/22 1700  Drainage Amount Scant 07/05/22 1700  Drainage Description Serous 07/05/22 1700  Treatment Irrigation;Packing (Dry gauze) 07/05/22 1700     Wound Image   07/05/22 1700  Dressing Type Foam - Lift dressing to assess site every shift;Gauze (Comment);Honey 07/05/22 1700  Dressing Changed 07/05/22 1700  Dressing Change Frequency Twice a day 07/05/22 1700  State of Healing Eschar 07/05/22 1700  Site / Wound Assessment Brown;Black 07/05/22 1700  % Wound base Black/Eschar 100% 07/05/22 1700  Wound Length (cm) 7.7 cm 07/05/22 1700  Wound Width (cm) 4.5 cm 07/05/22 1700  Wound Surface Area (cm^2) 34.65 cm^2 07/05/22 1700  Margins Unattached edges (unapproximated) 07/05/22 1700   Drainage Amount Scant 07/05/22 1700  Drainage Description Serous 07/05/22 1700  Treatment Irrigation;Packing (Dry gauze);Other (Comment) 07/05/22 1700     Pressure Injury 07/05/22 Ischial tuberosity Right Unstageable - Full thickness tissue loss in which the base of the injury is covered by slough (yellow, tan, gray, green or brown) and/or eschar (tan, brown or black) in the wound bed. R ischium (Active)  Dressing Type Foam - Lift dressing to assess site every shift;Honey;Gauze (Comment);Barrier Film (skin prep);Other (Comment) 07/05/22 1708  Dressing Change Frequency Twice a day 07/05/22 1708  State of Healing Eschar 07/05/22 1708  Site / Wound Assessment Black;Brown 07/05/22 1708  % Wound base Black/Eschar 100% 07/05/22 1708  Peri-wound Assessment Erythema (non-blanchable);Pink 07/05/22 1708  Wound Length (cm) 4.4 cm 07/05/22 1708  Wound Width (cm) 3.9 cm 07/05/22 1708  Wound Surface Area (cm^2) 17.16 cm^2 07/05/22 1708  Margins Unattached edges (unapproximated) 07/05/22 1708  Drainage Amount Scant 07/05/22 1708  Drainage Description Serous 07/05/22 1708  Treatment Irrigation;Packing (Dry gauze) 07/05/22 1708     Pressure Injury 07/05/22 Buttocks Left Unstageable - Full thickness tissue loss in which the base of the injury is covered by slough (yellow, tan, gray, green or brown) and/or eschar (tan, brown or black) in the wound bed. L illium (Active)  Wound Image   07/05/22 1708  Dressing Type Foam - Lift dressing to assess site every shift;Honey;Gauze (Comment) 07/05/22 1708  Dressing Changed 07/05/22 1708  Dressing Change Frequency Twice a day 07/05/22 1708  State of Healing Eschar 07/05/22 1708  Site / Wound Assessment Black;Brown 07/05/22 1708  % Wound base Black/Eschar 100% 07/05/22 1708  Peri-wound Assessment Pink 07/05/22 1708  Wound Length (cm) 8.6 cm  07/05/22 1708  Wound Width (cm) 7.5 cm 07/05/22 1708  Wound Surface Area (cm^2) 64.5 cm^2 07/05/22 1708  Margins Unattached  edges (unapproximated) 07/05/22 1708  Drainage Amount Scant 07/05/22 1708  Drainage Description Serous 07/05/22 1708  Treatment Irrigation;Packing (Dry gauze);Other (Comment) 07/05/22 1708        Wound Assessment and Plan  Wound Therapy - Assess/Plan/Recommendations Wound Therapy - Clinical Statement: Pt is cachectic, chronically ill and has bilateral LE contractures. This admission has had Cortrak placed and is awaiting PEG tube placement. Pt currently has multiple unstageble wounds on most of the bony prominences of the hip, (sacrum, bilateral ischium and L ilium. Pt has been deemed not a surgical candidate for debridement and has been referred to PT Wound Therapy for wound treatment. Treatment today consisted of eschar scoring and dressing with Medihoney for autolytic debridement of his wounds. Palliative RN in room at end of session inquired into treatment plan. PT and RN agreed that given pt poor prognosis for wound healing, that protection provided by intact eschar over bony prominence may be more beneficial than debridement of the areas. RN to speak to family about a "more gentle" approach to these wounds. PT will sign off on wound therapy at this time. Please reorder if pt/family request more aggressive treatment for wound healing. Wound Therapy - Functional Problem List: decreased nutrition, contractures, decreased mobilization and HD Factors Delaying/Impairing Wound Healing: Diabetes Mellitus, Incontinence, Infection - systemic/local, Immobility, Multiple medical problems Hydrotherapy Plan: Debridement, Dressing change, Patient/family education Wound Therapy - Frequency:  (2x/wk) Wound Therapy - Current Recommendations: Other (comment) (Palliative) Wound Therapy - Follow Up Recommendations: dressing changes by RN  Wound Therapy Goals- Improve the function of patient's integumentary system by progressing the wound(s) through the phases of wound healing (inflammation - proliferation -  remodeling) by: Wound Therapy Goals - Improve the function of patient's integumentary system by progressing the wound(s) through the phases of wound healing by: Increase Granulation Tissue to: 5% Increase Granulation Tissue - Progress: Goal set today Goals/treatment plan/discharge plan were made with and agreed upon by patient/family: No, Patient unable to participate in goals/treatment/discharge plan and family unavailable Wound Therapy - Potential for Goals: Poor  Goals will be updated until maximal potential achieved or discharge criteria met.  Discharge criteria: when goals achieved, discharge from hospital, MD decision/surgical intervention, no progress towards goals, refusal/missing three consecutive treatments without notification or medical reason.  GP     Charges PT Wound Care Charges $ Wound Debridement each add'l 20 sqcm: 7 $PT Hydrotherapy Visit: 2 Visits       [place] 07/05/2022, 5:38 PM

## 2022-07-05 NOTE — Progress Notes (Signed)
   07/05/22 1300  Pain Assessment  Pain Scale Faces  Pain Score 0  Neurological  Level of Consciousness Alert  Orientation Level Oriented to person  Respiratory  Respiratory Pattern Regular;Unlabored  Chest Assessment Chest expansion symmetrical  Bilateral Breath Sounds Diminished  Cardiac  Cardiac Rhythm ST  Ectopy Unifocal PVC's  Ectopy Frequency Frequent   Received patient in bed to unit.  Alert and oriented.  Informed consent signed and in chart.   Treatment initiated: 740a Treatment completed: 1300p  Patient tolerated well.  Transported back to the room  Alert, without acute distress.  Hand-off given to patient's nurse.   Access used: Yes Access issues: No  Total UF removed: 0.1 Medication(s) given: Vancomycin 750 Post HD VS: 98.1(axillary), 109, 29, 132/72, O2 96% R/A Post HD weight: 83.0kg   [facility]

## 2022-07-05 NOTE — Progress Notes (Signed)
   07/05/22 0001  Assess: MEWS Score  Temp 98.3 F (36.8 C)  BP (!) 161/79  Pulse Rate (!) 111 (just cleaned up pt, pt is a total care)  Resp (!) 22  SpO2 96 %  O2 Device Room Air  Assess: MEWS Score  MEWS Temp 0  MEWS Systolic 0  MEWS Pulse 2  MEWS RR 1  MEWS LOC 0  MEWS Score 3  MEWS Score Color Yellow  Assess: if the MEWS score is Yellow or Red  Were vital signs taken at a resting state? No  Focused Assessment No change from prior assessment  Does the patient meet 2 or more of the SIRS criteria? No  Does the patient have a confirmed or suspected source of infection? Yes  MEWS guidelines implemented *See Row Information* No, previously yellow, continue vital signs every 4 hours  Document  Patient Outcome Stabilized after interventions  Progress note created (see row info) Yes  Assess: SIRS CRITERIA  SIRS Temperature  0  SIRS Pulse 1  SIRS Respirations  1  SIRS WBC 1  SIRS Score Sum  3

## 2022-07-05 NOTE — Consult Note (Signed)
Cody Macdonald [birth year]  [id number].    Requesting MD: Dr. Barb Merino Chief Complaint/Reason for Consult: multiple wounds  HPI:  This is a 61 yo black male who will not participate in his exam.  He will not talk to me or move for me.  History as per his chart.  He had a CVA earlier this year and has been in SNF since that time.  Appears the patient has had overall decline in his health and is essentially bedbound and has acquired multiple wounds all over his body.  We have been asked to assess.  ROS: ROS: unable as he will not speak to me  History reviewed. No pertinent family history.  Past Medical History:  Diagnosis Date   Chronic kidney disease    Hypertension    Stroke [facility])     Past Surgical History:  Procedure Laterality Date   IR FLUORO GUIDE CV LINE RIGHT  12/25/2021   IR REMOVAL TUN CV CATH W/O FL  07/02/2022   IR US GUIDE VASC ACCESS RIGHT  12/25/2021    Social History:  reports that he has been smoking cigarettes. He has been smoking an average of .25 packs per day. He has never used smokeless tobacco. He reports current alcohol use of about 1.0 standard drink of alcohol per week. He reports current drug use. Drug: Marijuana.  Allergies: No Known Allergies  Medications Prior to Admission  Medication Sig Dispense Refill   acetaminophen (TYLENOL) 325 MG tablet Take 650 mg by mouth every 6 (six) hours as needed for mild pain.     albuterol (PROVENTIL) (2.5 MG/3ML) 0.083% nebulizer solution Take 3 mLs (2.5 mg total) by nebulization every 6 (six) hours as needed for wheezing or shortness of breath. 75 mL 12   amLODipine (NORVASC) 10 MG tablet Take 1 tablet (10 mg total) by mouth daily. 30 tablet    aspirin EC 81 MG tablet Take 81 mg by mouth daily. Swallow whole.     atorvastatin (LIPITOR) 80 MG tablet Take 80 mg by mouth at bedtime.     clopidogrel (PLAVIX) 75 MG tablet Take 75 mg by mouth daily.     hydrALAZINE (APRESOLINE) 50 MG tablet Take 1 tablet (50 mg  total) by mouth every 8 (eight) hours. (Patient taking differently: Take 50 mg by mouth 3 (three) times daily.)     labetalol (NORMODYNE) 300 MG tablet Take 1 tablet (300 mg total) by mouth 2 (two) times daily.     melatonin 3 MG TABS tablet Take 3 mg by mouth at bedtime.     MULTIPLE VITAMIN PO Take 1 tablet by mouth at bedtime.     multivitamin (RENA-VIT) TABS tablet Take 1 tablet by mouth at bedtime.  0   Nutritional Supplements (NEPRO PO) Take by mouth in the morning and at bedtime. AHR Nepro     pantoprazole (PROTONIX) 40 MG tablet Take 40 mg by mouth daily.     sevelamer carbonate (RENVELA) 800 MG tablet Take 1 tablet (800 mg total) by mouth 3 (three) times daily with meals. (Patient taking differently: Take 1,600 mg by mouth 3 (three) times daily with meals.)     UNABLE TO FIND Take 30 mLs by mouth in the morning and at bedtime. Liquid protein     ergocalciferol (VITAMIN D2) 1.25 MG (50000 UT) capsule Take 50,000 Units by mouth every Tuesday. (Patient not taking: Reported on 06/08/2022)     feeding supplement (ENSURE ENLIVE / ENSURE PLUS) LIQD  Take 237 mLs by mouth 2 (two) times daily between meals. (Patient not taking: Reported on 04/04/2022) 237 mL 12     Physical Exam: Blood pressure (!) 160/85, pulse (!) 117, temperature 98.3 F (36.8 C), temperature source Oral, resp. rate (!) 28, height 5\' 8"  (1.727 m), weight 85 kg, SpO2 98 %. Gen: NAD, laying in bed, chronically ill and wasting features noted Skin: left ischial wound and sacral wound noted.  He would not roll for me to see his right side.  Wounds all with adherent dry eschar present.  No evidence of infection.       Results for orders placed or performed during the hospital encounter of 06/08/22 (from the past 48 hour(s))  Glucose, capillary     Status: None   Collection Time: 07/03/22 12:24 PM  Result Value Ref Range   Glucose-Capillary 84 70 - 99 mg/dL    Comment: Glucose reference range applies only to samples taken after  fasting for at least 8 hours.  Glucose, capillary     Status: None   Collection Time: 07/03/22  3:51 PM  Result Value Ref Range   Glucose-Capillary 96 70 - 99 mg/dL    Comment: Glucose reference range applies only to samples taken after fasting for at least 8 hours.  Glucose, capillary     Status: None   Collection Time: 07/03/22 11:03 PM  Result Value Ref Range   Glucose-Capillary 95 70 - 99 mg/dL    Comment: Glucose reference range applies only to samples taken after fasting for at least 8 hours.   Comment 1 Notify RN    Comment 2 Document in Chart   Basic metabolic panel     Status: Abnormal   Collection Time: 07/04/22 12:29 AM  Result Value Ref Range   Sodium 137 135 - 145 mmol/L   Potassium 4.3 3.5 - 5.1 mmol/L   Chloride 95 (L) 98 - 111 mmol/L   CO2 29 22 - 32 mmol/L   Glucose, Bld 84 70 - 99 mg/dL    Comment: Glucose reference range applies only to samples taken after fasting for at least 8 hours.   BUN 69 (H) 6 - 20 mg/dL   Creatinine, Ser 3.11 (H) 0.61 - 1.24 mg/dL   Calcium 9.1 8.9 - 10.3 mg/dL   GFR, Estimated 22 (L) >60 mL/min    Comment: (NOTE) Calculated using the CKD-EPI Creatinine Equation (2021)    Anion gap 13 5 - 15    Comment: Performed at [facility] [address], [place], [place] [address]  CBC     Status: Abnormal   Collection Time: 07/04/22 12:29 AM  Result Value Ref Range   WBC 12.9 (H) 4.0 - 10.5 K/uL   RBC 2.83 (L) 4.22 - 5.81 MIL/uL   Hemoglobin 7.8 (L) 13.0 - 17.0 g/dL   HCT 23.9 (L) 39.0 - 52.0 %   MCV 84.5 80.0 - 100.0 fL   MCH 27.6 26.0 - 34.0 pg   MCHC 32.6 30.0 - 36.0 g/dL   RDW 15.9 (H) 11.5 - 15.5 %   Platelets 515 (H) 150 - 400 K/uL   nRBC 0.0 0.0 - 0.2 %    Comment: Performed at [facility] [address] [place], [place] [address]  Magnesium     Status: None   Collection Time: 07/04/22 12:29 AM  Result Value Ref Range   Magnesium 2.2 1.7 - 2.4 mg/dL    Comment: Performed at [facility], [address] [place], [place] [address]  Phosphorus     Status: None   Collection Time: 07/04/22 12:29 AM  Result Value Ref Range   Phosphorus 3.5 2.5 - 4.6 mg/dL    Comment: Performed at [facility], [place] [address] [place], [place] [address]  Glucose, capillary     Status: None   Collection Time: 07/04/22  1:07 AM  Result Value Ref Range   Glucose-Capillary 82 70 - 99 mg/dL    Comment: Glucose reference range applies only to samples taken after fasting for at least 8 hours.   Comment 1 Notify RN    Comment 2 Document in Chart   Glucose, capillary     Status: None   Collection Time: 07/04/22  5:56 AM  Result Value Ref Range   Glucose-Capillary 86 70 - 99 mg/dL    Comment: Glucose reference range applies only to samples taken after fasting for at least 8 hours.   Comment 1 Notify RN    Comment 2 Document in Chart   Glucose, capillary     Status: None   Collection Time: 07/04/22  7:22 AM  Result Value Ref Range   Glucose-Capillary 85 70 - 99 mg/dL    Comment: Glucose reference range applies only to samples taken after fasting for at least 8 hours.  Glucose, capillary     Status: None   Collection Time: 07/04/22 10:37 AM  Result Value Ref Range   Glucose-Capillary 88 70 - 99 mg/dL    Comment: Glucose reference range applies only to samples taken after fasting for at least 8 hours.  Glucose, capillary     Status: Abnormal   Collection Time: 07/04/22  3:38 PM  Result Value Ref Range   Glucose-Capillary 104 (H) 70 - 99 mg/dL    Comment: Glucose reference range applies only to samples taken after fasting for at least 8 hours.  Glucose, capillary     Status: None   Collection Time: 07/04/22  8:57 PM  Result Value Ref Range   Glucose-Capillary 95 70 - 99 mg/dL    Comment: Glucose reference range applies only to samples taken after fasting for at least 8 hours.  Glucose, capillary     Status: None   Collection Time: 07/05/22 12:05 AM  Result Value Ref Range   Glucose-Capillary 95 70 - 99  mg/dL    Comment: Glucose reference range applies only to samples taken after fasting for at least 8 hours.  Renal function panel     Status: Abnormal   Collection Time: 07/05/22 12:49 AM  Result Value Ref Range   Sodium 135 135 - 145 mmol/L   Potassium 4.2 3.5 - 5.1 mmol/L   Chloride 94 (L) 98 - 111 mmol/L   CO2 26 22 - 32 mmol/L   Glucose, Bld 93 70 - 99 mg/dL    Comment: Glucose reference range applies only to samples taken after fasting for at least 8 hours.   BUN 107 (H) 6 - 20 mg/dL   Creatinine, Ser 4.38 (H) 0.61 - 1.24 mg/dL   Calcium 9.3 8.9 - 10.3 mg/dL   Phosphorus 3.8 2.5 - 4.6 mg/dL   Albumin <1.5 (L) 3.5 - 5.0 g/dL   GFR, Estimated 15 (L) >60 mL/min    Comment: (NOTE) Calculated using the CKD-EPI Creatinine Equation (2021)    Anion gap 15 5 - 15    Comment: Performed at [facility] [address] [place], [place] [address]  CBC with Differential/Platelet     Status: Abnormal   Collection Time: 07/05/22 12:49 AM  Result Value Ref Range   WBC 12.9 (H) 4.0 - 10.5 K/uL   RBC 2.85 (L) 4.22 - 5.81 MIL/uL   Hemoglobin 7.9 (L) 13.0 - 17.0 g/dL   HCT 24.1 (L) 39.0 - 52.0 %   MCV 84.6 80.0 - 100.0 fL   MCH 27.7 26.0 - 34.0 pg   MCHC 32.8 30.0 - 36.0 g/dL   RDW 15.9 (H) 11.5 - 15.5 %   Platelets 511 (H) 150 - 400 K/uL   nRBC 0.0 0.0 - 0.2 %   Neutrophils Relative % 61 %   Neutro Abs 7.9 (H) 1.7 - 7.7 K/uL   Lymphocytes Relative 19 %   Lymphs Abs 2.5 0.7 - 4.0 K/uL   Monocytes Relative 11 %   Monocytes Absolute 1.4 (H) 0.1 - 1.0 K/uL   Eosinophils Relative 7 %   Eosinophils Absolute 0.9 (H) 0.0 - 0.5 K/uL   Basophils Relative 1 %   Basophils Absolute 0.1 0.0 - 0.1 K/uL   Immature Granulocytes 1 %   Abs Immature Granulocytes 0.11 (H) 0.00 - 0.07 K/uL    Comment: Performed at [facility], [address] [place], [place] [address]  Glucose, capillary     Status: Abnormal   Collection Time: 07/05/22  6:00 AM  Result Value Ref Range   Glucose-Capillary  107 (H) 70 - 99 mg/dL    Comment: Glucose reference range applies only to samples taken after fasting for at least 8 hours.   Comment 1 Notify RN    Comment 2 Document in Chart   Glucose, capillary     Status: None   Collection Time: 07/05/22  7:17 AM  Result Value Ref Range   Glucose-Capillary 96 70 - 99 mg/dL    Comment: Glucose reference range applies only to samples taken after fasting for at least 8 hours.   No results found.    Assessment/Plan Multiple unstageable wounds This is a chronically ill-appearing male with multiple wounds on his sacrum, ischial tuberosities, and legs secondary to immobility.  Given his overall condition and state of being, I would not recommend aggressive surgical intervention in the operating room.  These eschar are dry and adherent.  They are not amenable to bedside debridement at this time.  With that being said, even if they were, he will likely never heal any of these wounds as he does not mobilize and his nutrition is clearly very poor.  Would recommend conservative management if they family does not want to pursue full comfort care.  Medihoney and PT hydrotherapy with dressing changes.  Ultimately, the patient appears that he would benefit from full comfort measures, but will defer this to PCM/primary service.  No role for surgical intervention.  We are available if needed.   I reviewed Consultant palliative care, nephrology notes, hospitalist notes, last 24 h vitals and pain scores, last 48 h intake and output, last 24 h labs and trends, and last 24 h imaging results.  Henreitta Cea, [facility] Surgery 07/05/2022, 8:39 AM Please see Amion for pager number during day hours 7:00am-4:30pm or 7:00am -11:30am on weekends

## 2022-07-05 NOTE — Progress Notes (Signed)
[facility] Progress Note   Subjective:    Seen and examined patient at bedside while on HD. In bed and appears comfortable. Opens eyes to voice but not interacting.   Objective Vitals:   07/05/22 1130 07/05/22 1200 07/05/22 1231 07/05/22 1252  BP: 120/80 128/75 132/83 132/72  Pulse: (!) 110 (!) 109 (!) 109 (!) 109  Resp: 20 (!) 29 (!) 29 (!) 31  Temp:    98.1 F (36.7 C)  TempSrc:    Axillary  SpO2: 98% 98% 99% 96%  Weight:    83 kg  Height:       Physical Exam General: chronically ill appearing, contracted and cachetic male in NAD Heart:+tachycardia Lungs:CTA anteriorly Abdomen:soft, NTND Extremities:no LE edema Dialysis Access: LU AVG +b/t in use  Filed Weights   07/05/22 0001 07/05/22 0900 07/05/22 1252  Weight: 85 kg 84 kg 83 kg    Intake/Output Summary (Last 24 hours) at 07/05/2022 1401 Last data filed at 07/05/2022 1252 Gross per 24 hour  Intake --  Output -0.1 ml  Net 0.1 ml     Additional Objective Labs: Basic Metabolic Panel: Recent Labs  Lab 07/03/22 0749 07/04/22 0029 07/05/22 0049  NA 136 137 135  K 4.9 4.3 4.2  CL 95* 95* 94*  CO2 26 29 26   GLUCOSE 101* 84 93  BUN 118* 69* 107*  CREATININE 5.79* 3.11* 4.38*  CALCIUM 9.0 9.1 9.3  PHOS 5.2* 3.5 3.8    Liver Function Tests: Recent Labs  Lab 06/30/22 0051 07/01/22 0112 07/03/22 0749 07/05/22 0049  AST 62* 58* 38  --   ALT 39 36 13  --   ALKPHOS 117 122 103  --   BILITOT 0.5 0.3 0.4  --   PROT 6.4* 6.3* 6.1*  --   ALBUMIN <1.5* <1.5* <1.5* <1.5*    No results for input(s): "LIPASE", "AMYLASE" in the last 168 hours. CBC: Recent Labs  Lab 07/01/22 0112 07/01/22 1629 07/02/22 0139 07/02/22 1825 07/03/22 0749 07/04/22 0029 07/05/22 0049  WBC 14.1*  --  12.0*  --  13.8* 12.9* 12.9*  NEUTROABS 10.0*  --   --   --  9.3*  --  7.9*  HGB 6.1*   < > 6.1*   < > 7.5* 7.8* 7.9*  HCT 19.3*   < > 19.2*   < > 23.6* 23.9* 24.1*  MCV 86.2  --  85.0  --  85.5 84.5 84.6   PLT 594*  --  556*  --  575* 515* 511*   < > = values in this interval not displayed.    Blood Culture    Component Value Date/Time   SDES BLOOD RIGHT FOREARM 06/27/2022 0802   SPECREQUEST  06/27/2022 0802    BOTTLES DRAWN AEROBIC ONLY Blood Culture results may not be optimal due to an inadequate volume of blood received in culture bottles   CULT  06/27/2022 0802    NO GROWTH 5 DAYS Performed at [facility], [place] [address] [place], [place] [address]    REPTSTATUS 07/02/2022 FINAL 06/27/2022 0802    Cardiac Enzymes: No results for input(s): "CKTOTAL", "CKMB", "CKMBINDEX", "TROPONINI" in the last 168 hours. CBG: Recent Labs  Lab 07/04/22 2057 07/05/22 0005 07/05/22 0600 07/05/22 0717 07/05/22 1357  GLUCAP 95 95 107* 96 85    Iron Studies: No results for input(s): "IRON", "TIBC", "TRANSFERRIN", "FERRITIN" in the last 72 hours. Lab Results  Component Value Date   INR 1.2 07/02/2022  INR 1.3 (H) 06/08/2022   INR 1.1 12/24/2021   Studies/Results: No results found.  Medications:  ceFEPime (MAXIPIME) IV 1 g (07/04/22 2237)   feeding supplement (NEPRO CARB STEADY) 1,000 mL (07/03/22 1747)   vancomycin 750 mg (07/05/22 1137)    Chlorhexidine Gluconate Cloth  6 each Topical Q0600   darbepoetin (ARANESP) injection - DIALYSIS  150 mcg Subcutaneous Q Thu-1800   famotidine  20 mg Per Tube Daily   feeding supplement (PROSource TF20)  60 mL Per Tube Daily   heparin  5,000 Units Subcutaneous Q8H   leptospermum manuka honey  1 Application Topical Daily   metroNIDAZOLE  500 mg Per Tube BID   multivitamin  1 tablet Per Tube QHS   nutrition supplement (JUVEN)  1 packet Per Tube BID BM   mouth rinse  15 mL Mouth Rinse 4 times per day   sodium hypochlorite   Topical BID   [START ON 07/06/2022] thiamine  100 mg Per Tube Daily    Dialysis Orders: MWF at [facility] Dr 3.5h  48kg  2.2/5 bath  300/600  Hep none R AVG/TDC (AVG ok to use reportedly)   CXR 11/10- bilat  splotchy perihilar disease, edema vs infxn, improved compared to 11/8 film  Assessment/Plan: 1.  Resolved acute hypoxic respiratory failure: Now stable on RA. Due to combination of pulmonary edema/volume overload with bronchospasm along with influenza +/- and probable HCAP.  Previously improving, CXR 11/27 w/ Probable retrocardiac LLL opacity suspicious for airspace disease/pneumonia. CT w/possible LLL PNA, no other acute abnormalities. ABX started. Per PMD.  2. Fever/leukocytosis - Afebrile now.  Mild leukocytosis. BC w/NGTD.  CXR 11/27 & CT 11/28 w/possible PNA. Foul smelling pressure wounds.  Socorro removed 07/02/22.  3. Altered mental status: per staff at [facility] is not unusual for him not to respond verbally at the OP unit.  4.  ESRD: on HD MWF. HD today minimal UF 5.  HTN/ vol: Blood pressure mostly well controlled. Weights labile likely sig under dry wt. BP's not tolerating high UF. Minimal UF with HD.  6.  Secondary hyperparathyroidism: Holding sevelamer as P < 3.0. Calcium in goal.   7.  Anemia of chronic disease: S/p 1 unit PRBCs 12/1 (total 3 units this admission). Aranesp raised  to 147mcg weekly 12/1.  TSAT 12% - iron course started, then held d/t concern for acute infection, will restart when off ABX. Hgb now 7.8. Transfuse for Hgb < 7. 9. Nutrition - on tube feeds - changed to Nepro.  Plan for PEG tube placement by IR on 12/1.   10.GOC - pt very frail, not interacting very much verbally. Pall care involved.   Jannifer Hick MD [facility] Pager [phone]

## 2022-07-05 NOTE — Progress Notes (Signed)
Interventional Radiology Brief Note:  Patient previously assessed for gastrostomy tube placement.  Consult completed 06/29/22.  DAPT held for possible procedure.  Unfortunately developed fever, elevated WBC.  Tube placement deferred for possible infectious work-up.  Patient does have multiple open wounds for which he was evaluated by surgery.  No surgical debridement planned at this time.  He is on abx therapy.  WBC stable between 12-13K.  Palliative Care is following and have indicated family wishes to proceed with placement. Anticipate proceeding mid-late week if clinically remains stable.   Brynda Greathouse, MS RD PA-C

## 2022-07-05 NOTE — Progress Notes (Signed)
Palliative: Chart review completed.  Attempted to see Mr. Chui but he was off the floor in HD.  Good conference with son Jefm Miles via telephone yesterday.  He was to come in and see Mr. Gentile that afternoon.  He was tearful during our discussions.  Unfortunately, it seems that Mr. Catlin has continued to worsen during his lengthy hospital stay.  He is not showing meaningful improvements.  Patient and son have been agreeable to PEG tube placement, this was held due to fevers.  There is a possibility that PEG tube will be placed this week. Mr. Berrios continues to have contractions, making his care difficult.   Return later in the day to find Mr. Phommachanh at bedside with physical therapy present doing wound care.  Mr. Wyndham does not engage with me although I believe that he does understand what I am saying and what is happening.  I encouraged him to discuss his choices with his son.  We talked about gentle wound care.  Conference with attending, bedside nursing staff, transition of care team related to patient condition, needs, goals of care, disposition.  Plan: At this point continue to treat the treatable but no CPR to patient.  At this point, no surgical intervention for wounds.  Time for outcomes.  Return to long-term care facility where he has been since May of this year after his stroke.  It would not be surprising if, in the next few months, Mr. Haslam is unable to sit in a chair for HD.  He would benefit from "gentle" wound care.  75 minutes  [name], NP Palliative medicine team Team phone [phone] Greater than 50% of this time was spent counseling and coordinating care related to the above assessed and plan.

## 2022-07-05 NOTE — Progress Notes (Signed)
Physician Assistant, Stephania Fragmin, told RN Clarene Critchley to keep pt.on 3.5 hours instead of switching pt. To 3 hours treatment.

## 2022-07-05 NOTE — Progress Notes (Signed)
Nutrition Follow-up  DOCUMENTATION CODES:   Underweight, Severe malnutrition in context of chronic illness  INTERVENTION:  - Increase TF rate to 50 mL/hr + TF20 Prosource TID. This will provide the pt with 2364 kcals, 157 gm protein and 872 mL free water.   - Continue Juven BID.   - Add Banatrol BID.   NUTRITION DIAGNOSIS:   Severe Malnutrition related to chronic illness (ESRD on HD) as evidenced by severe muscle depletion, severe fat depletion.  GOAL:   Patient will meet greater than or equal to 90% of their needs  MONITOR:   Diet advancement, Weight trends, Supplement acceptance, Labs  REASON FOR ASSESSMENT:   Consult Assessment of nutrition requirement/status  ASSESSMENT:   Pt with hx of ESRD on HD, hx CVA, HTN, and hx of EtOH and tobacco abuse presented to ED from SNF with SOB, hyperkalemia, and in HTN crisis.  Meds reviewed. Labs reviewed.    Pt continues with Cortrak in place receiving Nepro @ 40 mL/hr + TF20 Prosource. RD will modify needs to go based off IBW. Pt's wounds are not healing. Pt would likely benefit from added calories and protein. Will modify goal rate to 50 mL/hr and increase TF20 Prosource to TID. RN reports that the pt has been tolerating TF but did have one episode of diarrhea. Will also add Banatrol TF BID. Will continue to monitor TF tolerance. Could not place PEG today due to infection and fever.   Diet Order:   Diet Order             Diet NPO time specified Except for: Sips with Meds  Diet effective midnight                   EDUCATION NEEDS:   Not appropriate for education at this time  Skin:  Skin Assessment: Skin Integrity Issues: Skin Integrity Issues:: Unstageable DTI: L lateral foot Stage II: per WOC: R sacrum measures 3.2 cm x 2 cm and is 100% pink Unstageable: per WOC:  Left buttock close to the anus (9 cm x 6 cm); Left ischium (9 cm x 5 cm), coccyx area (10 cm x 8 cm), and a smaller one above this that is 1 cm x 0.4  cm  Last BM:  07/05/22  Height:   Ht Readings from Last 1 Encounters:  06/09/22 5\' 8"  (1.727 m)    Weight:   Wt Readings from Last 1 Encounters:  07/05/22 83 kg    Ideal Body Weight:  70 kg  BMI:  Body mass index is 27.82 kg/m.  Estimated Nutritional Needs:   Kcal:  2100-2450 kcals  Protein:  140-175 gm  Fluid:  1000 mL + Urine output  Wandy Bossler Graciela Husbands, RD, LDN, CNSC.

## 2022-07-06 DIAGNOSIS — Z66 Do not resuscitate: Secondary | ICD-10-CM | POA: Diagnosis not present

## 2022-07-06 DIAGNOSIS — J189 Pneumonia, unspecified organism: Secondary | ICD-10-CM | POA: Diagnosis not present

## 2022-07-06 DIAGNOSIS — Z7189 Other specified counseling: Secondary | ICD-10-CM | POA: Diagnosis not present

## 2022-07-06 DIAGNOSIS — Z515 Encounter for palliative care: Secondary | ICD-10-CM | POA: Diagnosis not present

## 2022-07-06 DIAGNOSIS — A419 Sepsis, unspecified organism: Secondary | ICD-10-CM | POA: Diagnosis not present

## 2022-07-06 LAB — GLUCOSE, CAPILLARY
Glucose-Capillary: 102 mg/dL — ABNORMAL HIGH (ref 70–99)
Glucose-Capillary: 105 mg/dL — ABNORMAL HIGH (ref 70–99)
Glucose-Capillary: 107 mg/dL — ABNORMAL HIGH (ref 70–99)
Glucose-Capillary: 108 mg/dL — ABNORMAL HIGH (ref 70–99)
Glucose-Capillary: 111 mg/dL — ABNORMAL HIGH (ref 70–99)
Glucose-Capillary: 99 mg/dL (ref 70–99)

## 2022-07-06 MED ORDER — CEFAZOLIN SODIUM-DEXTROSE 2-4 GM/100ML-% IV SOLN: 2.0000 g | INTRAVENOUS | Status: AC

## 2022-07-06 NOTE — Consult Note (Signed)
[facility] Nurse wound follow up Wound type: skin failure and multiple pressure injuries Rapid decline in patient skin; feel this is indicative of skin failure in this patient Measurement:see nursing flow sheets Left lateral foot; multiple DTPIs dark purple/or blood filled Lateral malleolus; Unstageable PI; eschar Sacrum; Unstageable PI; eschar soft/brown/yellow Right ileac creast; Unstageable PI; eschar soft brown/yellow Left medial foot: multiple DTPIs; dark purple/or blood filled Left heel; Unstageable PI; eschar Left medial malleolus; Unstageable PI; eschar  Left lateral calf; partial thickness wounds; Stage 2 PI; pink Left knee; DTPI; dark purple Right lateral calf; 2 partial thickness wounds; Stage 2 PI; pink; distal wound 80% pink/20% yellow  Additionally consider this patient may have an arterial component to his wounds because of the rapid decline of the LEs  Wound bed:see above  Drainage (amount, consistency, odor) see nursing notes and PT notes Periwound:contractures and bedbound Dressing procedure/placement/frequency: General surgery has ordered PT for hydrotherapy; not sure this is a viable treatment at this point because the eschar is adherent.  Continue Medihoney for wounds as ordered Continue to attempt to keep LE wounds stable using betadine daily and offloading as much as possible.  At this point Prevalon boots may not be beneficial. Will ask staff to offload with pillows   Low air loss mattress in place for pressure redistribution and moisture management Comfort care for his wounds would be in the patient's best interest, do not feel any of his wounds will heal in the patient's current state. Rapid decline of patient over last 2 weeks since I saw patient last. Wounds most likely will continue to worsen and opening wounds may make patient more likely for pain and suffering.   [facility] Nurse team will follow with you and see patient within 10 days for wound assessments.  Please notify  [facility] nurses of any acute changes in the wounds or any new areas of concern [place] MSN, [place], [place], [place]

## 2022-07-06 NOTE — Progress Notes (Signed)
[facility] Progress Note   Subjective:    Seen and examined patient at bedside. In bed and appears comfortable. Opens eyes to voice but not interacting.  No new issues per chart.   Objective Vitals:   07/06/22 0600 07/06/22 0700 07/06/22 0800 07/06/22 0930  BP: (!) 146/73 (!) 142/69 (!) 145/65 133/68  Pulse: (!) 114 (!) 115 (!) 111 (!) 115  Resp: (!) 23 (!) 33 20 20  Temp:   99.3 F (37.4 C)   TempSrc:   Oral   SpO2: 96% 97% 98% 98%  Weight:      Height:       Physical Exam General: chronically ill appearing, contracted and cachetic male in NAD Heart:+tachycardia Lungs:CTA anteriorly Abdomen:soft, NTND Extremities:no LE edema Dialysis Access: LU AVG +b/t   Filed Weights   07/05/22 0900 07/05/22 1252 07/06/22 0016  Weight: 84 kg 83 kg 83 kg    Intake/Output Summary (Last 24 hours) at 07/06/2022 1002 Last data filed at 07/06/2022 0400 Gross per 24 hour  Intake 2853.43 ml  Output -0.1 ml  Net 2853.53 ml     Additional Objective Labs: Basic Metabolic Panel: Recent Labs  Lab 07/03/22 0749 07/04/22 0029 07/05/22 0049  NA 136 137 135  K 4.9 4.3 4.2  CL 95* 95* 94*  CO2 _0 GLUCOSE 101* 84 93  BUN 118* 69* 107*  CREATININE 5.79* 3.11* 4.38*  CALCIUM 9.0 9.1 9.3  PHOS 5.2* 3.5 3.8    Liver Function Tests: Recent Labs  Lab 06/30/22 0051 07/01/22 0112 07/03/22 0749 07/05/22 0049  AST 62* 58* 38  --   ALT 39 36 13  --   ALKPHOS 117 122 103  --   BILITOT 0.5 0.3 0.4  --   PROT 6.4* 6.3* 6.1*  --   ALBUMIN <1.5* <1.5* <1.5* <1.5*    No results for input(s): "LIPASE", "AMYLASE" in the last 168 hours. CBC: Recent Labs  Lab 07/01/22 0112 07/01/22 1629 07/02/22 0139 07/02/22 1825 07/03/22 0749 07/04/22 0029 07/05/22 0049  WBC 14.1*  --  12.0*  --  13.8* 12.9* 12.9*  NEUTROABS 10.0*  --   --   --  9.3*  --  7.9*  HGB 6.1*   < > 6.1*   < > 7.5* 7.8* 7.9*  HCT 19.3*   < > 19.2*   < > 23.6* 23.9* 24.1*  MCV 86.2  --  85.0  --   85.5 84.5 84.6  PLT 594*  --  556*  --  575* 515* 511*   < > = values in this interval not displayed.    Blood Culture    Component Value Date/Time   SDES BLOOD RIGHT FOREARM 06/27/2022 0802   SPECREQUEST  06/27/2022 0802    BOTTLES DRAWN AEROBIC ONLY Blood Culture results may not be optimal due to an inadequate volume of blood received in culture bottles   CULT  06/27/2022 0802    NO GROWTH 5 DAYS Performed at [facility], [place] [address], [place],  [address]    REPTSTATUS 07/02/2022 FINAL 06/27/2022 0802    Cardiac Enzymes: No results for input(s): "CKTOTAL", "CKMB", "CKMBINDEX", "TROPONINI" in the last 168 hours. CBG: Recent Labs  Lab 07/05/22 1556 07/05/22 2011 07/06/22 0018 07/06/22 0400 07/06/22 0753  GLUCAP 89 108* 99 105* 102*    Iron Studies: No results for input(s): "IRON", "TIBC", "TRANSFERRIN", "FERRITIN" in the last 72 hours. Lab Results  Component Value Date   INR  1.2 07/02/2022   INR 1.3 (H) 06/08/2022   INR 1.1 12/24/2021   Studies/Results: No results found.  Medications:  ceFEPime (MAXIPIME) IV Stopped (07/05/22 2155)   feeding supplement (NEPRO CARB STEADY) 50 mL/hr at 07/06/22 0400   vancomycin Stopped (07/05/22 1821)    Chlorhexidine Gluconate Cloth  6 each Topical Q0600   darbepoetin (ARANESP) injection - DIALYSIS  150 mcg Subcutaneous Q Thu-1800   famotidine  20 mg Per Tube Daily   feeding supplement (PROSource TF20)  60 mL Per Tube TID   fiber supplement (BANATROL TF)  60 mL Per Tube BID   heparin  5,000 Units Subcutaneous Q8H   leptospermum manuka honey  1 Application Topical Daily   metroNIDAZOLE  500 mg Per Tube BID   multivitamin  1 tablet Per Tube QHS   nutrition supplement (JUVEN)  1 packet Per Tube BID BM   mouth rinse  15 mL Mouth Rinse 4 times per day   thiamine  100 mg Per Tube Daily    Dialysis Orders: MWF at [facility] Dr 3.5h  48kg  2.2/5 bath  300/600  Hep none R AVG/TDC (AVG ok to use reportedly)    CXR 11/10- bilat splotchy perihilar disease, edema vs infxn, improved compared to 11/8 film  Assessment/Plan: 1.  Resolved acute hypoxic respiratory failure: Now stable on RA. Due to combination of pulmonary edema/volume overload with bronchospasm along with influenza +/- and probable HCAP.  Abx per primary.  2. Fever/leukocytosis - Afebrile now.  Mild leukocytosis. BC w/NGTD.  CXR 11/27 & CT 11/28 w/possible PNA. Foul smelling pressure wounds.  [place] removed 07/02/22.  3. Altered mental status: per staff at [facility] is not unusual for him not to respond verbally at the OP unit.  4.  ESRD: on HD MWF. HD next tomorrow 5.  HTN/ vol: Blood pressure mostly well controlled. minimal UF due to low po intake.   6.  Secondary hyperparathyroidism: Holding sevelamer as P < 3.0. Calcium in goal.   7.  Anemia of chronic disease: S/p 1 unit PRBCs 12/1 (total 3 units this admission). Aranesp raised  to 167mg weekly 12/1.  TSAT 12% - iron course started, then held d/t concern for acute infection, will restart when off ABX. Hgb now 7s. Transfuse for Hgb < 7. 9. Nutrition - on tube feeds - changed to Nepro.  Plan for PEG tube placement by IR on 12/5.  10.GOC - pt very frail, not interacting very much verbally. Pall care involved - met w son 12/4 - at this point DNR, cont other care, plans for PEG tube this admission then return to long term care facility with ongoing dialysis fo rnow.   LJannifer HickMD [facility] Pager [phone]

## 2022-07-06 NOTE — Progress Notes (Addendum)
SLP Cancellation/Discharge Note  Patient Details Name: ABHIJAY MORRISS MRN: [id number] DOB: 08-16-1960   Cancelled treatment:       Reason Eval/Treat Not Completed: Medical issues which prohibited therapy  Discussed pt status with RN. Pt is minimally interactive, and may have PEG placed tomorrow with DC to SNF after that. Pt has not made progress toward ST goals (PO readiness, follow strategies), and has had decline in status in general. ST will sign off at this time. Please reconsult when/if pt improves and is willing and able to participate in skilled ST intervention, and/or indicates willingness/ability to try PO.  Jaclene Bartelt B. Quentin Ore, [facility], [place] Speech Language Pathologist Office: [phone]  Shonna Chock 07/06/2022, 11:22 AM

## 2022-07-06 NOTE — Progress Notes (Signed)
Orthopedic Tech Progress Note Patient Details:  TOREZ BEAUREGARD May 28, 1961 [id number]  Ortho Devices Type of Ortho Device: Velcro wrist forearm splint Ortho Device/Splint Location: LUE Ortho Device/Splint Interventions: Ordered, Application, Adjustment   Post Interventions Patient Tolerated: Fair, Well Instructions Provided: Care of device  Janit Pagan 07/06/2022, 10:41 AM

## 2022-07-06 NOTE — Progress Notes (Signed)
Tube feed stopped per order for procedure in AM- pt NPO/

## 2022-07-06 NOTE — Progress Notes (Signed)
Daily Progress Note   Patient Name: Cody Macdonald       Date: 07/06/2022 DOB: [birth year]  Age: 61 y.o. MRN#: [id number] Attending Physician: Barb Merino, MD Primary Care Physician: Pcp, No Admit Date: 06/08/2022 Length of Stay: 28 days  Reason for Consultation/Follow-up: Establishing goals of care  HPI/Patient Profile:  CVA, HTN, ESRD on HD MWF, sent from his SNF due to shortness of breath and hypertensive urgency.  He was hypoxic requiring up to 10 L nasal cannula and had pulmonary edema on the chest x-ray. Hospitalization complicated by fevers and worsening pressure wounds.  Pressure wounds thought to be source of infection.  Fevers currently improved.  PEG was delayed due to fevers and anemia.  Remains in very poor clinical status.  Did not agree for comfort care and hospice that was recommended.  PMT consulted for [place] conversations.  Subjective:   Subjective: Chart Reviewed. Updates received. Patient Assessed. Created space and opportunity for patient  and family to explore thoughts and feelings regarding current medical situation.  Today's Discussion: Today met with the patient at the bedside.  While he does open his eyes to his voice and touch, makes eye contact, he is not interactive and does not answer questions or shake his head to attempt to answer questions.  He does not appear uncomfortable at this time.  I spoke with the patient's nurse she states that he is generally okay unless he is turned in the bed or otherwise disturbed.  I called and spoke with the patient's son to touch base about goals and plan moving forward.  He states the plan is to place a PEG tube/feeding tube but they had to hold off on this due to fevers.  I informed him that the most recent hospitalist note indicates that they may try to do this tomorrow.  We also had an extensive discussion about his wounds and the fact that they are unlikely to heal.  Because of this it has been recommended for  conservative wound care and to avoid traumatic/painful wound care as it is not likely to make a difference in the overall long-term picture.  He seems to agree.  He states that the plan is he knows it is for his dad to discharge back to long-term care facility after the PEG is placed.  I discussed given his multiple chronic comorbidities and his difficult hospitalization that I would recommend outpatient palliative care follow him in the long-term care setting.  I explained they can follow along as his illness progresses and help support the patient and family as other health issues arise or worsen.  He is in agreement for this.  I provided contact information for if he or his mom have any further questions.  I informed him I am off tomorrow but would check in on Thursday when I am back.  In the interim he can call and leave a message with our team if further needs before then.  I provided emotional and general support through therapeutic listening, empathy, sharing of stories, and other techniques. I answered all questions and addressed all concerns to the best of my ability.  Review of Systems  Unable to perform ROS: Mental status change    Objective:   Vital Signs:  BP 136/71 (BP Location: Right Arm)   Pulse (!) 108   Temp 100 F (37.8 C) (Oral)   Resp 20   Ht _0  (1.727 m)   Wt 83 kg   SpO2 98%  BMI 27.82 kg/m   Physical Exam: Physical Exam Vitals and nursing note reviewed.  Constitutional:      General: He is sleeping. He is not in acute distress. HENT:     Head: Normocephalic and atraumatic.  Pulmonary:     Effort: Pulmonary effort is normal. No respiratory distress.  Skin:    General: Skin is warm and dry.  Neurological:     Mental Status: He is easily aroused.     Comments: Not interactive, unable to assess orientation     Palliative Assessment/Data: 20%    Existing Vynca/ACP Documentation: None  Assessment & Plan:   Impression: Present on Admission:   HCAP (healthcare-associated pneumonia)  ESRD (end stage renal disease) ([place])  61 year old male with multiple chronic comorbidities and acute presentation as described above.  At this point goals established have been no CPR, no surgery.  Family has elected for PEG tube placement due to poor nutritional intake (currently has a core track).  PEG delayed due to fevers, plans to possibly place tomorrow.  He has multiple wounds unlikely to heal due to his bedbound status and contractures.  Surgery recommended conservative measures.  Goal is for gentle wound care that is not overtly distressful.  Overall plan for discharge back to long-term care facility where he lives once PEG tube is placed and he is safe for discharge.  I have recommended, and the patient's son has accepted, referral for outpatient palliative care.  Overall prognosis is poor.  SUMMARY OF RECOMMENDATIONS   Remain DNR No surgery PEG tube placement per family wishes when fevers abated Gentle wound care/conservative treatment of wounds TOC consult for outpatient palliative care Continue to treat the treatable Continue to support patient and family PMT will follow-up on Thursday 12/7 Please call us if any urgent needs before then.  Symptom Management:  Per primary team PMT is available to assist as needed  Code Status: DNR  Prognosis: Unable to determine  Discharge Planning:  Back to LTC with outpatient palliative  Discussed with: Patient's family, medical team, nursing team, [facility] team  Thank you for allowing [place] to participate in the care of Cody Macdonald PMT will continue to support holistically.  Time Total: 50 min  Visit consisted of counseling and education dealing with the complex and emotionally intense issues of symptom management and palliative care in the setting of serious and potentially life-threatening illness. Greater than 50%  of this time was spent counseling and coordinating care related to the above  assessment and plan.  Walden Field, NP Palliative Medicine Team  Team Phone # [phone] (Nights/Weekends)  03/31/2021, 8:17 AM

## 2022-07-06 NOTE — Progress Notes (Signed)
PROGRESS NOTE    Cody Macdonald  GYJ:[id number] DOB: November 29, 1960 DOA: 06/08/2022 PCP: Pcp, No    Brief Narrative:  61 year old male with CVA, HTN, ESRD on HD MWF, sent from his SNF due to shortness of breath and hypertensive urgency.  He was hypoxic requiring up to 10 L nasal cannula and had pulmonary edema on the chest x-ray.  He was initially in the ICU, underwent emergent dialysis and required Cleviprex infusion for hypertensive urgency.  With improvement he was transferred to the hospitalist service on 11/10.  Hospital course complicated by poor mentation, persistent encephalopathy and failed swallow eval.  Palliative care was consulted and multiple GOC were held, he is now DNR and family wishes for PEG tube.   Hospitalization complicated by fevers and worsening pressure wounds.  Pressure wounds thought to be source of infection.  Fevers currently improved.  PEG was delayed due to fevers and anemia.  Remains in very poor clinical status.  Did not agree for comfort care and hospice that was recommended.   Assessment & Plan:   Acute hypoxic respiratory failure due to multifocal pneumonia due to influenza A as well as superimposed acute pulmonary edema in the setting of fluid overload in a dialysis patient Respiratory status overall improving, currently he is on room air.  He completed a course of Tamiflu as well as empiric antibiotics, finished a 10-day course on 11/17.  With recurrence of fever, investigations including CT chest on pelvis with mild left lower lobe airspace opacity that is pneumonia versus atelectasis stable lucent lesion on the right iliac bone On third round of antibiotic therapy and currently remains on cefepime and vancomycin and Flagyl. We will continue antibiotics until PEG tube placement tomorrow. MRI pelvis 12/1 without any evidence of osteomyelitis or underlying collection. MRI left foot with soft tissue ulcers, no osseous changes to suggest osteomyelitis or soft  tissue abscesses.  Hypertensive Urgency -patient required ICU level of care for a Cleviprex drip --- currently BP improved - has labetalol and hydralazine prn    ESRD on HD-management as per nephrology. Tunneled dialysis catheter removed on 12/1.  Now getting dialysis through AV graft.   Hypophosphatemia, hypokalemia and hypomagnesemia, hyperkalemia-per renal.  Adequate.   Poor p.o. intake -Has been refusing to eat and not really eating very much.  Currently has Dobbhoff tube feeding.  Planning for PEG tube placement tomorrow.   H/o CVA -On Aspirin and Clopidogrel, on dysphagia 1 diet but not eating very well.  Plavix and aspirin being held per IR.    Anemia of Chronic Kidney Disease  Hb gradually downtrending, chronic disease, iron def, follow S/p 3 unit pRBC (inadequate response, but now stable) No obvious bleeding, will watch closely  Holding plavix and aspirin above for now   Hypoalbuminemia -noted   Acute Metabolic Encephalopathy-overall improved but still does not engage much.   Abnormal LFTs -minimal, improving   Underweight/ Severe Malnutrition in the Context of Chronic Illness/cachexia -on tube feeds   Pressure Ulcers, not present on admission See 11/28 note, reevaluation of wounds to sacrum L foot and L heel.  Has large foul smelling unstageable wounds on the L buttock close to anus, L ischium, coccyx.  . Stage 2 to R sacrum.  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  New DTPI to LL leg.  Planning for iodine.  Prevalon boot.  Right iliac crest has DTPI.  Standard size bed with air mattress.  Due to rapid decline, they're recommending discussions regarding comfort measures.  Palliative  following. Seen by surgery today.  Recommended conservative management, local wound care and hydrotherapy. Surgical debridement is not going to be beneficial as patient is bedbound and there will be no any other intervention to heal the wound.  Goal of care: Severe medical debility.  Bedbound.   Contractures.  Multiple pressure ulcers not amenable to surgery or healing.  Frailty and debility failure to thrive.  Comfort care and hospice appropriate. Multiple goal of care discussions, palliative care is involved. DNR/DNI. Family wished for PEG tube feeding, IR on board. Back to nursing home after PEG tube placement.   DVT prophylaxis: heparin injection 5,000 Units Start: 07/03/22 0600 SCDs Start: 06/08/22 1543   Code Status: DNR Family Communication: None at the bedside. Disposition Plan: Status is: Inpatient Remains inpatient appropriate because: Unable to eat     Consultants:  Critical care General surgery Palliative  Procedures:  Multiple procedures as above.  Antimicrobials:  Multiple rounds of antibiotics.  Currently on cefepime vancomycin and Flagyl   Subjective: Patient seen and examined.  No overnight events.  Does not engage in conversation.  Nursing assistant tells me that he does mouth some words with her when he is in good mood.  Objective: Vitals:   07/06/22 0700 07/06/22 0800 07/06/22 0930 07/06/22 1238  BP: (!) 142/69 (!) 145/65 133/68 136/71  Pulse: (!) 115 (!) 111 (!) 115 (!) 108  Resp: (!) 33 20 20 20   Temp:  99.3 F (37.4 C)  100 F (37.8 C)  TempSrc:  Oral  Oral  SpO2: 97% 98% 98% 98%  Weight:      Height:        Intake/Output Summary (Last 24 hours) at 07/06/2022 1246 Last data filed at 07/06/2022 0816 Gross per 24 hour  Intake 2883.43 ml  Output -0.1 ml  Net 2883.53 ml    Filed Weights   07/05/22 0900 07/05/22 1252 07/06/22 0016  Weight: 84 kg 83 kg 83 kg    Examination:  General: Sick looking, cachectic, paraplegia, contracted extremities. Cardiovascular: S1-S2 normal.  Tachycardic. Respiratory: On room air.  Mildly tachypneic after procedure. Gastrointestinal: Nontender.  Soft.  Some voluntary guarding present. Ext: Contracted extremities.  Multiple decubitus ulcers as noted. Neuro: Alert, lethargic, does not follow any  commands.  Does not have any spontaneous movements.     Data Reviewed: I have personally reviewed following labs and imaging studies  CBC: Recent Labs  Lab 07/01/22 0112 07/01/22 1629 07/02/22 0139 07/02/22 1825 07/03/22 0749 07/04/22 0029 07/05/22 0049  WBC 14.1*  --  12.0*  --  13.8* 12.9* 12.9*  NEUTROABS 10.0*  --   --   --  9.3*  --  7.9*  HGB 6.1*   < > 6.1* 7.5* 7.5* 7.8* 7.9*  HCT 19.3*   < > 19.2* 22.4* 23.6* 23.9* 24.1*  MCV 86.2  --  85.0  --  85.5 84.5 84.6  PLT 594*  --  556*  --  575* 515* 511*   < > = values in this interval not displayed.    Basic Metabolic Panel: Recent Labs  Lab 06/30/22 0051 07/01/22 0112 07/02/22 0139 07/03/22 0749 07/04/22 0029 07/05/22 0049  NA 130* 135 135 136 137 135  K 5.8* 4.9 4.9 4.9 4.3 4.2  CL 92* 94* 91* 95* 95* 94*  CO2 23 25 27 26 29 26   GLUCOSE 96 97 97 101* 84 93  BUN 90* 55* 81* 118* 69* 107*  CREATININE 5.00* 3.36* 4.43* 5.79* 3.11* 4.38*  CALCIUM  8.1* 8.6* 8.4* 9.0 9.1 9.3  MG 2.6* 2.2  --  2.7* 2.2  --   PHOS  --  3.7  --  5.2* 3.5 3.8    GFR: Estimated Creatinine Clearance: 18.8 mL/min (A) (by C-G formula based on SCr of 4.38 mg/dL (H)). Liver Function Tests: Recent Labs  Lab 06/30/22 0051 07/01/22 0112 07/03/22 0749 07/05/22 0049  AST 62* 58* 38  --   ALT 39 36 13  --   ALKPHOS 117 122 103  --   BILITOT 0.5 0.3 0.4  --   PROT 6.4* 6.3* 6.1*  --   ALBUMIN <1.5* <1.5* <1.5* <1.5*    No results for input(s): "LIPASE", "AMYLASE" in the last 168 hours. No results for input(s): "AMMONIA" in the last 168 hours. Coagulation Profile: Recent Labs  Lab 07/02/22 0139  INR 1.2    Cardiac Enzymes: No results for input(s): "CKTOTAL", "CKMB", "CKMBINDEX", "TROPONINI" in the last 168 hours. BNP (last 3 results) No results for input(s): "PROBNP" in the last 8760 hours. HbA1C: No results for input(s): "HGBA1C" in the last 72 hours. CBG: Recent Labs  Lab 07/05/22 2011 07/06/22 0018 07/06/22 0400  07/06/22 0753 07/06/22 1106  GLUCAP 108* 99 105* 102* 107*    Lipid Profile: No results for input(s): "CHOL", "HDL", "LDLCALC", "TRIG", "CHOLHDL", "LDLDIRECT" in the last 72 hours. Thyroid Function Tests: No results for input(s): "TSH", "T4TOTAL", "FREET4", "T3FREE", "THYROIDAB" in the last 72 hours. Anemia Panel: No results for input(s): "VITAMINB12", "FOLATE", "FERRITIN", "TIBC", "IRON", "RETICCTPCT" in the last 72 hours. Sepsis Labs: No results for input(s): "PROCALCITON", "LATICACIDVEN" in the last 168 hours.  Recent Results (from the past 240 hour(s))  Culture, blood (Routine X 2) w Reflex to ID Panel     Status: None   Collection Time: 06/27/22  7:51 AM   Specimen: BLOOD  Result Value Ref Range Status   Specimen Description BLOOD RIGHT ANTECUBITAL  Final   Special Requests   Final    BOTTLES DRAWN AEROBIC AND ANAEROBIC Blood Culture results may not be optimal due to an inadequate volume of blood received in culture bottles   Culture   Final    NO GROWTH 5 DAYS Performed at [facility], [place] [address] [place], [place] [address]    Report Status 07/02/2022 FINAL  Final  Culture, blood (Routine X 2) w Reflex to ID Panel     Status: None   Collection Time: 06/27/22  8:02 AM   Specimen: BLOOD RIGHT FOREARM  Result Value Ref Range Status   Specimen Description BLOOD RIGHT FOREARM  Final   Special Requests   Final    BOTTLES DRAWN AEROBIC ONLY Blood Culture results may not be optimal due to an inadequate volume of blood received in culture bottles   Culture   Final    NO GROWTH 5 DAYS Performed at [facility], [place] [address], [place], [place] [address]    Report Status 07/02/2022 FINAL  Final  Respiratory (~20 pathogens) panel by PCR     Status: None   Collection Time: 06/30/22  8:10 AM   Specimen: Nasopharyngeal Swab; Respiratory  Result Value Ref Range Status   Adenovirus NOT DETECTED NOT DETECTED Final   Coronavirus 229E NOT DETECTED NOT DETECTED Final     Comment: (NOTE) The Coronavirus on the Respiratory Panel, DOES NOT test for the novel  Coronavirus (2019 nCoV)    Coronavirus HKU1 NOT DETECTED NOT DETECTED Final   Coronavirus NL63 NOT DETECTED  NOT DETECTED Final   Coronavirus OC43 NOT DETECTED NOT DETECTED Final   Metapneumovirus NOT DETECTED NOT DETECTED Final   Rhinovirus / Enterovirus NOT DETECTED NOT DETECTED Final   Influenza A NOT DETECTED NOT DETECTED Final   Influenza B NOT DETECTED NOT DETECTED Final   Parainfluenza Virus 1 NOT DETECTED NOT DETECTED Final   Parainfluenza Virus 2 NOT DETECTED NOT DETECTED Final   Parainfluenza Virus 3 NOT DETECTED NOT DETECTED Final   Parainfluenza Virus 4 NOT DETECTED NOT DETECTED Final   Respiratory Syncytial Virus NOT DETECTED NOT DETECTED Final   Bordetella pertussis NOT DETECTED NOT DETECTED Final   Bordetella Parapertussis NOT DETECTED NOT DETECTED Final   Chlamydophila pneumoniae NOT DETECTED NOT DETECTED Final   Mycoplasma pneumoniae NOT DETECTED NOT DETECTED Final    Comment: Performed at [facility], [place] [address] [place], [place] [address]  SARS Coronavirus 2 by RT PCR (hospital order, performed in [facility] hospital lab) *cepheid single result test* Anterior Nasal Swab     Status: None   Collection Time: 06/30/22  8:10 AM   Specimen: Anterior Nasal Swab  Result Value Ref Range Status   SARS Coronavirus 2 by RT PCR NEGATIVE NEGATIVE Final    Comment: (NOTE) SARS-CoV-2 target nucleic acids are NOT DETECTED.  The SARS-CoV-2 RNA is generally detectable in upper and lower respiratory specimens during the acute phase of infection. The lowest concentration of SARS-CoV-2 viral copies this assay can detect is 250 copies / mL. A negative result does not preclude SARS-CoV-2 infection and should not be used as the sole basis for treatment or other patient management decisions.  A negative result may occur with improper specimen collection / handling, submission of specimen  other than nasopharyngeal swab, presence of viral mutation(s) within the areas targeted by this assay, and inadequate number of viral copies (<250 copies / mL). A negative result must be combined with clinical observations, patient history, and epidemiological information.  Fact Sheet for Patients:   https://www.patel.info/  Fact Sheet for Healthcare Providers: https://hall.com/  This test is not yet approved or  cleared by the [place] FDA and has been authorized for detection and/or diagnosis of SARS-CoV-2 by FDA under an Emergency Use Authorization (EUA).  This EUA will remain in effect (meaning this test can be used) for the duration of the COVID-19 declaration under Section 564(b)(1) of the Act, 21 U.S.C. section 360bbb-3(b)(1), unless the authorization is terminated or revoked sooner.  Performed at [facility], [place] [address], [place], [place] [address]          Radiology Studies: No results found.      Scheduled Meds:  Chlorhexidine Gluconate Cloth  6 each Topical Q0600   darbepoetin (ARANESP) injection - DIALYSIS  150 mcg Subcutaneous Q Thu-1800   famotidine  20 mg Per Tube Daily   feeding supplement (PROSource TF20)  60 mL Per Tube TID   fiber supplement (BANATROL TF)  60 mL Per Tube BID   heparin  5,000 Units Subcutaneous Q8H   leptospermum manuka honey  1 Application Topical Daily   metroNIDAZOLE  500 mg Per Tube BID   multivitamin  1 tablet Per Tube QHS   nutrition supplement (JUVEN)  1 packet Per Tube BID BM   mouth rinse  15 mL Mouth Rinse 4 times per day   thiamine  100 mg Per Tube Daily   Continuous Infusions:  [START ON 07/07/2022]  ceFAZolin (ANCEF) IV     ceFEPime (MAXIPIME)  IV Stopped (07/05/22 2155)   feeding supplement (NEPRO CARB STEADY) 1,000 mL (07/06/22 1027)   vancomycin Stopped (07/05/22 1821)     LOS: 28 days    Time spent: 35 minutes    Barb Merino, MD [facility] Pager [phone]

## 2022-07-06 NOTE — Progress Notes (Signed)
   07/06/22 0930  Assess: MEWS Score  BP 133/68  Pulse Rate (!) 115  Resp 20  SpO2 98 %  O2 Device Room Air  Assess: MEWS Score  MEWS Temp 0  MEWS Systolic 0  MEWS Pulse 2  MEWS RR 0  MEWS LOC 0  MEWS Score 2  MEWS Score Color Yellow  Treat  Pain Scale 0-10  Pain Score 0  Take Vital Signs  Increase Vital Sign Frequency  Yellow: Q 2hr X 2 then Q 4hr X 2, if remains yellow, continue Q 4hrs  Escalate  MEWS: Escalate Yellow: discuss with charge nurse/RN and consider discussing with provider and RRT  Notify: Charge Nurse/RN  Name of Charge Nurse/RN Notified velia, RN  Date Charge Nurse/RN Notified 07/06/22  Time Charge Nurse/RN Notified [phone]  Document  Patient Outcome Stabilized after interventions  Assess: SIRS CRITERIA  SIRS Temperature  0  SIRS Pulse 1  SIRS Respirations  0  SIRS WBC 1  SIRS Score Sum  2

## 2022-07-07 ENCOUNTER — Inpatient Hospital Stay (HOSPITAL_COMMUNITY): Payer: Medicaid Other

## 2022-07-07 HISTORY — PX: IR GASTROSTOMY TUBE MOD SED: IMG625

## 2022-07-07 LAB — RENAL FUNCTION PANEL
Albumin: <1.5 g/dL — ABNORMAL LOW (ref 3.5–5.0)
Anion gap: 16 — ABNORMAL HIGH (ref 5–15)
BUN: 120 mg/dL — ABNORMAL HIGH (ref 6–20)
CO2: 26 mmol/L (ref 22–32)
Calcium: 10.1 mg/dL (ref 8.9–10.3)
Chloride: 97 mmol/L — ABNORMAL LOW (ref 98–111)
Creatinine, Ser: 4.11 mg/dL — ABNORMAL HIGH (ref 0.61–1.24)
GFR, Estimated: 16 mL/min — ABNORMAL LOW (ref >60)
Glucose, Bld: 116 mg/dL — ABNORMAL HIGH (ref 70–99)
Phosphorus: 2.8 mg/dL (ref 2.5–4.6)
Potassium: 3.8 mmol/L (ref 3.5–5.1)
Sodium: 139 mmol/L (ref 135–145)

## 2022-07-07 LAB — CBC
HCT: 22.7 % — ABNORMAL LOW (ref 39.0–52.0)
Hemoglobin: 7.3 g/dL — ABNORMAL LOW (ref 13.0–17.0)
MCH: 27.5 pg (ref 26.0–34.0)
MCHC: 32.2 g/dL (ref 30.0–36.0)
MCV: 85.7 fL (ref 80.0–100.0)
Platelets: 459 10*3/uL — ABNORMAL HIGH (ref 150–400)
RBC: 2.65 MIL/uL — ABNORMAL LOW (ref 4.22–5.81)
RDW: 15.9 % — ABNORMAL HIGH (ref 11.5–15.5)
WBC: 14 10*3/uL — ABNORMAL HIGH (ref 4.0–10.5)
nRBC: 0 % (ref 0.0–0.2)

## 2022-07-07 LAB — GLUCOSE, CAPILLARY
Glucose-Capillary: 129 mg/dL — ABNORMAL HIGH (ref 70–99)
Glucose-Capillary: 75 mg/dL (ref 70–99)
Glucose-Capillary: 77 mg/dL (ref 70–99)
Glucose-Capillary: 84 mg/dL (ref 70–99)
Glucose-Capillary: 85 mg/dL (ref 70–99)
Glucose-Capillary: 88 mg/dL (ref 70–99)
Glucose-Capillary: 91 mg/dL (ref 70–99)

## 2022-07-07 LAB — HEPATITIS B SURFACE ANTIGEN: Hepatitis B Surface Ag: NONREACTIVE

## 2022-07-07 MED ORDER — MIDAZOLAM HCL 2 MG/2ML IJ SOLN
INTRAMUSCULAR | Status: AC
  Filled 2022-07-07: qty 2

## 2022-07-07 MED ORDER — GLUCAGON HCL (RDNA) 1 MG IJ SOLR
INTRAMUSCULAR | Status: AC | PRN
  Administered 2022-07-07: .5 mg via INTRAVENOUS

## 2022-07-07 MED ORDER — GLUCAGON HCL RDNA (DIAGNOSTIC) 1 MG IJ SOLR
INTRAMUSCULAR | Status: AC
  Filled 2022-07-07: qty 1

## 2022-07-07 MED ORDER — CEFAZOLIN SODIUM-DEXTROSE 2-4 GM/100ML-% IV SOLN
INTRAVENOUS | Status: AC
  Administered 2022-07-07: 2 g via INTRAVENOUS
  Filled 2022-07-07: qty 100

## 2022-07-07 MED ORDER — MIDAZOLAM HCL 2 MG/2ML IJ SOLN
INTRAMUSCULAR | Status: AC | PRN
  Administered 2022-07-07: .5 mg via INTRAVENOUS
  Administered 2022-07-07: 1 mg via INTRAVENOUS

## 2022-07-07 MED ORDER — VANCOMYCIN HCL IN DEXTROSE 1-5 GM/200ML-% IV SOLN
1000.0000 mg | Freq: Once | INTRAVENOUS | Status: AC
  Administered 2022-07-08: 1000 mg via INTRAVENOUS
  Filled 2022-07-07: qty 200

## 2022-07-07 MED ORDER — FENTANYL CITRATE (PF) 100 MCG/2ML IJ SOLN
INTRAMUSCULAR | Status: AC
  Filled 2022-07-07: qty 2

## 2022-07-07 MED ORDER — FENTANYL CITRATE (PF) 100 MCG/2ML IJ SOLN
INTRAMUSCULAR | Status: AC | PRN
  Administered 2022-07-07 (×2): 25 ug via INTRAVENOUS

## 2022-07-07 MED ORDER — LIDOCAINE HCL 1 % IJ SOLN
INTRAMUSCULAR | Status: AC
  Administered 2022-07-07: 10 mL
  Filled 2022-07-07: qty 20

## 2022-07-07 MED ORDER — IOHEXOL 300 MG/ML  SOLN
50.0000 mL | Freq: Once | INTRAMUSCULAR | Status: AC | PRN
  Administered 2022-07-07: 20 mL

## 2022-07-07 NOTE — Progress Notes (Signed)
Pt  transported to hemodialysis via bed.

## 2022-07-07 NOTE — Progress Notes (Signed)
[facility] PROGRESS NOTE  AKSHAT MINEHART (DOB: [birth year]) LOV:[id number] PCP: Pcp, No  Brief Narrative: 61 year old male with CVA, HTN, ESRD on HD MWF, sent from his SNF due to shortness of breath and hypertensive urgency.  He was hypoxic requiring up to 10 L nasal cannula and had pulmonary edema on the chest x-ray.  He was initially in the ICU, underwent emergent dialysis and required Cleviprex infusion for hypertensive urgency.  With improvement he was transferred to the hospitalist service on 11/10.  Hospital course complicated by poor mentation, persistent encephalopathy and failed swallow eval. Palliative care was consulted and multiple GOC were held, he is now DNR and family wishes for PEG tube.   Hospitalization complicated by fevers and worsening pressure wounds.  Pressure wounds thought to be source of infection.  Fevers currently improved.  PEG was delayed due to fevers and anemia.  Remains in very poor clinical status.  Did not agree for comfort care and hospice that was recommended. PEG was ultimately placed 12/6 facilitating discharge back to nursing facility 12/7.  Subjective: Nonverbal, makes eye contact, some movement/shaking head no to questions about pain. No other interaction really. No events overnight, PEG went well.  Objective: BP 135/75 (BP Location: Right Arm)   Pulse (!) 102   Temp 98.3 F (36.8 C) (Oral)   Resp (!) 21   Ht 5\' 8"  (1.727 m)   Wt 85 kg   SpO2 95%   BMI 28.49 kg/m   Gen: Cachectic, contracted male in no acute distress Pulm: Nonlabored, clear on room air  CV: Regular borderline tachycardia. GI: Soft, NT, ND, +BS. PEG site dressing c/d/i Neuro: Alert, unable to examine though has extremity contractures  Skin: Left arm AVG +thrill. pressure injuries not reexamined today.   Assessment & Plan: Acute hypoxic respiratory failure due to multifocal pneumonia due to influenza A as well as superimposed acute pulmonary edema in the setting of fluid  overload in a dialysis patient Respiratory status overall improving, currently he is on room air.  He completed a course of Tamiflu as well as empiric antibiotics, finished a 10-day course on 11/17.  With recurrence of fever, investigations including CT chest on pelvis with mild left lower lobe airspace opacity that is pneumonia versus atelectasis stable lucent lesion on the right iliac bone On third round of antibiotic therapy and currently remains on cefepime and vancomycin and Flagyl. Blood cultures NGTD. Tmax 100.28F. Leukocytosis continues, suspect inflammation related to pressure injuries. Has been treated x7 days (11/29 - 12/5). DC further antibiotics for now.  MRI pelvis 12/1 without any evidence of osteomyelitis or underlying collection. MRI left foot with soft tissue ulcers, no osseous changes to suggest osteomyelitis or soft tissue abscesses.   Hypertensive Urgency -patient required ICU level of care for a Cleviprex drip --- currently BP improved - has labetalol and hydralazine prn    ESRD on HD-management as per nephrology. - Tunneled dialysis catheter removed on 12/1.  - Now getting dialysis through AV graft.   Hypophosphatemia, hypokalemia and hypomagnesemia, hyperkalemia-per renal.  Adequate.   Poor p.o. intake -Has been refusing to eat and not really eating very much.  Currently has Dobbhoff tube feeding.  Planning for PEG tube placement tomorrow.   H/o CVA -On Aspirin and Clopidogrel, on dysphagia 1 diet but not eating very well.   - Plavix and aspirin being held per IR. Will plan to restart 12/7.   Anemia of Chronic Kidney Disease: S/p 3 unit pRBC. No obvious bleeding, will  watch closely. Holding plavix and aspirin above for now. - ESA per nephrology, recheck H/H in AM.   Hypoalbuminemia -noted, undetectably low.   Acute Metabolic Encephalopathy-overall improved but still does not engage much.   Abnormal LFTs -minimal, improving   Underweight/ Severe Malnutrition in  the Context of Chronic Illness/cachexia:  - Tube feeds initiate 12/7.   Pressure Ulcers, not present on admission See 11/28 note, reevaluation of wounds to sacrum L foot and L heel.  Has large foul smelling unstageable wounds on the L buttock close to anus, L ischium, coccyx. Stage 2 to R sacrum.  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  New DTPI to LL leg. Right iliac crest has DTPI.  - Planning for iodine.  Prevalon boot.  - Standard size bed with air mattress. - Surgery consulted, advised against surgery, consider hydrotherapy. Wound care RN and medical team feel the eschars currently in place are largely protective and since debriding these wounds would not lead to eventual healing, in fact would only cause more pain, we will treat noninvasively at this time. - Comfort measures were recommended but currently declined. Palliative following and should continue to follow at facility.     Goal of care: Severe medical debility.  Bedbound.  Contractures.  Multiple pressure ulcers not amenable to surgery or healing.  Frailty and debility failure to thrive.  Comfort care and hospice appropriate. Multiple goal of care discussions, palliative care is involved. DNR/DNI. Family wished for PEG tube feeding, placed 12/6 by IR Dr. Annamaria Boots. Cleared for full use starting 12/7. Back to nursing home after PEG tube placement.    Patrecia Pour, MD [facility] [email or web address] 07/07/2022, 1:57 PM

## 2022-07-07 NOTE — Progress Notes (Signed)
Report received from outgoing RN, pt off unit to procedure. Delia Heady RN

## 2022-07-07 NOTE — Progress Notes (Signed)
[facility] Progress Note   Subjective:    Seen and examined patient at bedside. In bed and appears comfortable. Had PEG placed this AM.  Opens eyes to voice but not interacting.  No new issues per chart.   Objective Vitals:   07/07/22 0840 07/07/22 0845 07/07/22 0850 07/07/22 0905  BP: (!) 158/90 (!) 164/89 (!) 156/89 137/74  Pulse: (!) 108 (!) 110 (!) 110 (!) 107  Resp: (!) 31 (!) 32 (!) 34 (!) 33  Temp:      TempSrc:      SpO2: 99% 100% 98% 94%  Weight:      Height:       Physical Exam General: chronically ill appearing, contracted and cachetic male in NAD Heart:+tachycardia Lungs:CTA anteriorly Abdomen:soft, NTND Extremities:no LE edema Dialysis Access: LU AVG +b/t   Filed Weights   07/05/22 1252 07/06/22 0016 07/07/22 0420  Weight: 83 kg 83 kg 85 kg    Intake/Output Summary (Last 24 hours) at 07/07/2022 0920 Last data filed at 07/07/2022 0606 Gross per 24 hour  Intake 1210.95 ml  Output --  Net 1210.95 ml     Additional Objective Labs: Basic Metabolic Panel: Recent Labs  Lab 07/04/22 0029 07/05/22 0049 07/07/22 0021  NA 137 135 139  K 4.3 4.2 3.8  CL 95* 94* 97*  CO2 _0 GLUCOSE 84 93 116*  BUN 69* 107* 120*  CREATININE 3.11* 4.38* 4.11*  CALCIUM 9.1 9.3 10.1  PHOS 3.5 3.8 2.8    Liver Function Tests: Recent Labs  Lab 07/01/22 0112 07/03/22 0749 07/05/22 0049 07/07/22 0021  AST 58* 38  --   --   ALT 36 13  --   --   ALKPHOS 122 103  --   --   BILITOT 0.3 0.4  --   --   PROT 6.3* 6.1*  --   --   ALBUMIN <1.5* <1.5* <1.5* <1.5*    No results for input(s): "LIPASE", "AMYLASE" in the last 168 hours. CBC: Recent Labs  Lab 07/01/22 0112 07/01/22 1629 07/02/22 0139 07/02/22 1825 07/03/22 0749 07/04/22 0029 07/05/22 0049 07/07/22 0021  WBC 14.1*  --  12.0*  --  13.8* 12.9* 12.9* 14.0*  NEUTROABS 10.0*  --   --   --  9.3*  --  7.9*  --   HGB 6.1*   < > 6.1*   < > 7.5* 7.8* 7.9* 7.3*  HCT 19.3*   < > 19.2*   < >  23.6* 23.9* 24.1* 22.7*  MCV 86.2  --  85.0  --  85.5 84.5 84.6 85.7  PLT 594*  --  556*  --  575* 515* 511* 459*   < > = values in this interval not displayed.    Blood Culture    Component Value Date/Time   SDES BLOOD RIGHT FOREARM 06/27/2022 0802   SPECREQUEST  06/27/2022 0802    BOTTLES DRAWN AEROBIC ONLY Blood Culture results may not be optimal due to an inadequate volume of blood received in culture bottles   CULT  06/27/2022 0802    NO GROWTH 5 DAYS Performed at [facility], [place] [address] [place], [place] [address]    REPTSTATUS 07/02/2022 FINAL 06/27/2022 0802    Cardiac Enzymes: No results for input(s): "CKTOTAL", "CKMB", "CKMBINDEX", "TROPONINI" in the last 168 hours. CBG: Recent Labs  Lab 07/06/22 1559 07/06/22 2017 07/07/22 0001 07/07/22 0416 07/07/22 0738  GLUCAP 111* 108* 129* 85 91  Iron Studies: No results for input(s): "IRON", "TIBC", "TRANSFERRIN", "FERRITIN" in the last 72 hours. Lab Results  Component Value Date   INR 1.2 07/02/2022   INR 1.3 (H) 06/08/2022   INR 1.1 12/24/2021   Studies/Results: No results found.  Medications:  ceFEPime (MAXIPIME) IV Stopped (07/06/22 2027)   feeding supplement (NEPRO CARB STEADY) Stopped (07/07/22 0001)   vancomycin Stopped (07/05/22 1821)    Chlorhexidine Gluconate Cloth  6 each Topical Q0600   darbepoetin (ARANESP) injection - DIALYSIS  150 mcg Subcutaneous Q Thu-1800   famotidine  20 mg Per Tube Daily   feeding supplement (PROSource TF20)  60 mL Per Tube TID   fiber supplement (BANATROL TF)  60 mL Per Tube BID   heparin  5,000 Units Subcutaneous Q8H   leptospermum manuka honey  1 Application Topical Daily   metroNIDAZOLE  500 mg Per Tube BID   multivitamin  1 tablet Per Tube QHS   nutrition supplement (JUVEN)  1 packet Per Tube BID BM   mouth rinse  15 mL Mouth Rinse 4 times per day   thiamine  100 mg Per Tube Daily    Dialysis Orders: MWF at [facility] [address] 3.5h  48kg  2.2/5 bath   300/600  Hep none R AVG/TDC (AVG ok to use reportedly)   CXR 11/10- bilat splotchy perihilar disease, edema vs infxn, improved compared to 11/8 film  Assessment/Plan: 1.  Resolved acute hypoxic respiratory failure: Now stable on RA. Due to combination of pulmonary edema/volume overload with bronchospasm along with influenza +/- and probable HCAP.  Abx per primary.  2. Fever/leukocytosis - Afebrile now.  Mild leukocytosis. BC w/NGTD.  CXR 11/27 & CT 11/28 w/possible PNA. Foul smelling pressure wounds.  [place] removed 07/02/22.  3. Altered mental status: per staff at [facility] is not unusual for him not to respond verbally at the OP unit.  4.  ESRD: on HD MWF. HD next today. 5.  HTN/ vol: Blood pressure mostly well controlled. minimal UF due to low po intake - follow after tube feeds started.   6.  Secondary hyperparathyroidism: Holding sevelamer as P < 3.0. Calcium in goal.   7.  Anemia of chronic disease: S/p 1 unit PRBCs 12/1 (total 3 units this admission). Aranesp raised  to 147mg weekly 12/1.  TSAT 12% - iron course started, then held d/t concern for acute infection, will restart when off ABX. Hgb now 7s. Transfuse for Hgb < 7. 9. Nutrition - on tube feeds - changed to Nepro.  PEG tube placement by IR on 12/6.  10.GOC - pt very frail, not interacting very much verbally. Pall care involved - met w son 12/4 - at this point DNR, cont other care, plans for PEG tube this admission then return to long term care facility with ongoing dialysis for now.   LJannifer HickMD [facility] Pager [phone]

## 2022-07-07 NOTE — Progress Notes (Signed)
IR called, patient going down for procedure soon. Transport just arrived.

## 2022-07-07 NOTE — Progress Notes (Signed)
Pt back to room from procedure. Report received and per report, pt PEG tube should not be used until in 4 hours time for medications and feedings in 24 hrs. Pt stable and PEG site has clean, dry gauze dsg. PEG assessed to be clamped. Will continue to monitor pt closely. Delia Heady RN   07/07/22 0931  Vitals  Temp 98.3 F (36.8 C)  Temp Source Oral  BP (!) 143/72  MAP (mmHg) 93  BP Location Right Arm  BP Method Automatic  Patient Position (if appropriate) Lying  Pulse Rate (!) 106  Pulse Rate Source Monitor  ECG Heart Rate (!) 106  Resp (!) 31  MEWS COLOR  MEWS Score Color Yellow  Oxygen Therapy  SpO2 93 %  O2 Device Room Air  MEWS Score  MEWS Temp 0  MEWS Systolic 0  MEWS Pulse 1  MEWS RR 2  MEWS LOC 0  MEWS Score 3

## 2022-07-07 NOTE — Procedures (Signed)
Interventional Radiology Procedure Note  Procedure: fluoro 20 fr Gtube    Complications: None  Estimated Blood Loss:  0  Findings: Confirmed in the stomach Full use tomorrow    Tamera Punt, MD

## 2022-07-08 LAB — GLUCOSE, CAPILLARY
Glucose-Capillary: 69 mg/dL — ABNORMAL LOW (ref 70–99)
Glucose-Capillary: 73 mg/dL (ref 70–99)
Glucose-Capillary: 89 mg/dL (ref 70–99)
Glucose-Capillary: 95 mg/dL (ref 70–99)

## 2022-07-08 MED ORDER — JUVEN PO PACK: 1.0000 | PACK | Freq: Two times a day (BID) | ORAL | 0 refills | Status: AC

## 2022-07-08 MED ORDER — DEXTROSE 50 % IV SOLN: 25.0000 g | Freq: Once | INTRAVENOUS | Status: AC

## 2022-07-08 MED ORDER — ATORVASTATIN CALCIUM 80 MG PO TABS: 80.0000 mg | ORAL_TABLET | Freq: Every day | ORAL | Status: AC

## 2022-07-08 MED ORDER — PROSOURCE TF20 ENFIT COMPATIBL EN LIQD: 60.0000 mL | Freq: Three times a day (TID) | ENTERAL | Status: AC

## 2022-07-08 MED ORDER — NEPRO/CARBSTEADY PO LIQD: 1000.0000 mL | ORAL | 0 refills | Status: AC

## 2022-07-08 MED ORDER — CLOPIDOGREL BISULFATE 75 MG PO TABS: 75.0000 mg | ORAL_TABLET | Freq: Every day | ORAL | Status: AC

## 2022-07-08 MED ORDER — BANATROL TF EN LIQD: 60.0000 mL | Freq: Two times a day (BID) | ENTERAL | Status: AC

## 2022-07-08 MED ORDER — DEXTROSE 50 % IV SOLN
INTRAVENOUS | Status: AC
  Administered 2022-07-08: 50 mL
  Filled 2022-07-08: qty 50

## 2022-07-08 MED ORDER — SEVELAMER CARBONATE 800 MG PO TABS: 800.0000 mg | ORAL_TABLET | Freq: Three times a day (TID) | ORAL | Status: AC

## 2022-07-08 MED ORDER — ACETAMINOPHEN 325 MG PO TABS: 650.0000 mg | ORAL_TABLET | Freq: Four times a day (QID) | ORAL | Status: AC | PRN

## 2022-07-08 MED ORDER — ASPIRIN 81 MG PO CHEW: 81.0000 mg | CHEWABLE_TABLET | Freq: Every day | ORAL | Status: AC

## 2022-07-08 MED ORDER — FAMOTIDINE 20 MG PO TABS: 20.0000 mg | ORAL_TABLET | Freq: Every day | ORAL | Status: AC

## 2022-07-08 MED ORDER — MEDIHONEY WOUND/BURN DRESSING EX PSTE: 1.0000 | PASTE | Freq: Every day | CUTANEOUS | Status: AC

## 2022-07-08 MED ORDER — MELATONIN 3 MG PO TABS: 3.0000 mg | ORAL_TABLET | Freq: Every day | ORAL | 0 refills | Status: AC

## 2022-07-08 MED ORDER — RENA-VITE PO TABS: 1.0000 | ORAL_TABLET | Freq: Every day | ORAL | 0 refills | Status: AC

## 2022-07-08 MED ORDER — VITAMIN B-1 100 MG PO TABS: 100.0000 mg | ORAL_TABLET | Freq: Every day | ORAL | Status: AC

## 2022-07-08 NOTE — Progress Notes (Signed)
Referring Physician(s): Dr. Bonner Puna  Supervising Physician: Jacqulynn Cadet  Patient Status:  [facility] - In-pt  Chief Complaint: Dysphagia  Subjective: Nonverbal, resting comfortably.  G-tube placed by Dr. Annamaria Boots yesterday.   Allergies: Patient has no known allergies.  Medications: Prior to Admission medications   Medication Sig Start Date End Date Taking? Authorizing Provider  albuterol (PROVENTIL) (2.5 MG/3ML) 0.083% nebulizer solution Take 3 mLs (2.5 mg total) by nebulization every 6 (six) hours as needed for wheezing or shortness of breath. 01/01/22  Yes Ghimire, Henreitta Leber, MD  amLODipine (NORVASC) 10 MG tablet Take 1 tablet (10 mg total) by mouth daily. 01/01/22  Yes Ghimire, Henreitta Leber, MD  aspirin (ASPIRIN CHILDRENS) 81 MG chewable tablet Place 1 tablet (81 mg total) into feeding tube daily. 07/08/22 07/08/23 Yes Patrecia Pour, MD  aspirin EC 81 MG tablet Take 81 mg by mouth daily. Swallow whole.   Yes [provider]  hydrALAZINE (APRESOLINE) 50 MG tablet Take 1 tablet (50 mg total) by mouth every 8 (eight) hours. Patient taking differently: Take 50 mg by mouth 3 (three) times daily. 01/01/22  Yes Ghimire, Henreitta Leber, MD  labetalol (NORMODYNE) 300 MG tablet Take 1 tablet (300 mg total) by mouth 2 (two) times daily. 01/01/22  Yes Ghimire, Henreitta Leber, MD  MULTIPLE VITAMIN PO Take 1 tablet by mouth at bedtime.   Yes [provider]  Nutritional Supplements (NEPRO PO) Take by mouth in the morning and at bedtime. AHR Nepro   Yes [provider]  pantoprazole (PROTONIX) 40 MG tablet Take 40 mg by mouth daily. 12/20/21  Yes [provider]  UNABLE TO FIND Take 30 mLs by mouth in the morning and at bedtime. Liquid protein   Yes [provider]  acetaminophen (TYLENOL) 325 MG tablet Place 2 tablets (650 mg total) into feeding tube every 6 (six) hours as needed for mild pain. 07/08/22   Patrecia Pour, MD  atorvastatin (LIPITOR) 80 MG tablet Place 1 tablet  (80 mg total) into feeding tube at bedtime. 07/08/22   Patrecia Pour, MD  clopidogrel (PLAVIX) 75 MG tablet Place 1 tablet (75 mg total) into feeding tube daily. 07/08/22   Patrecia Pour, MD  ergocalciferol (VITAMIN D2) 1.25 MG (50000 UT) capsule Take 50,000 Units by mouth every Tuesday. Patient not taking: Reported on 06/08/2022    [provider]  famotidine (PEPCID) 20 MG tablet Place 1 tablet (20 mg total) into feeding tube daily. 07/09/22   Patrecia Pour, MD  feeding supplement (ENSURE ENLIVE / ENSURE PLUS) LIQD Take 237 mLs by mouth 2 (two) times daily between meals. Patient not taking: Reported on 04/04/2022 01/01/22   Jonetta Osgood, MD  fiber supplement, BANATROL TF, liquid Place 60 mLs into feeding tube 2 (two) times daily. 07/08/22   Patrecia Pour, MD  leptospermum manuka honey (MEDIHONEY) PSTE paste Apply 1 Application topically daily. Apply to sacral wound daily Apply thin layer (3 mm) to wound. 07/09/22   Patrecia Pour, MD  melatonin 3 MG TABS tablet Place 1 tablet (3 mg total) into feeding tube at bedtime. 07/08/22   Patrecia Pour, MD  multivitamin (RENA-VIT) TABS tablet Place 1 tablet into feeding tube at bedtime. 07/08/22   Patrecia Pour, MD  nutrition supplement, JUVEN, Fanny Dance) PACK Place 1 packet into feeding tube 2 (two) times daily between meals. 07/08/22   Patrecia Pour, MD  Nutritional Supplements (FEEDING SUPPLEMENT, NEPRO CARB STEADY,) LIQD Place 1,000  mLs into feeding tube continuous. 87ml/hr 07/08/22   Patrecia Pour, MD  Protein (FEEDING SUPPLEMENT, PROSOURCE TF20,) liquid Place 60 mLs into feeding tube 3 (three) times daily. 07/08/22   Patrecia Pour, MD  sevelamer carbonate (RENVELA) 800 MG tablet Place 1 tablet (800 mg total) into feeding tube 3 (three) times daily with meals. 07/08/22   Patrecia Pour, MD  thiamine (VITAMIN B-1) 100 MG tablet Place 1 tablet (100 mg total) into feeding tube daily. 07/09/22   Patrecia Pour, MD     Vital Signs: BP (!) 122/59 (BP Location:  Right Wrist)   Pulse 97   Temp 98.2 F (36.8 C) (Oral)   Resp 20   Ht 5\' 8"  (1.727 m)   Wt 187 lb 6.3 oz (85 kg)   SpO2 100%   BMI 28.49 kg/m   Physical Exam NAD, alert Abdomen: G-tube intact.  Site without oozing or drainage.  TFs already infusing without issue.  No grimace to palpation.   Imaging: IR GASTROSTOMY TUBE MOD SED  Result Date: 07/07/2022 INDICATION: Pneumonia, dysphagia EXAM: FLUOROSCOPIC [place] Date:  07/07/2022 07/07/2022 9:07 am Radiologist:  M. Daryll Brod, MD Guidance:  Fluoroscopic MEDICATIONS: Ancef 2 g; Antibiotics were administered within 1 hour of the procedure. Glucagon 0.5 mg IV ANESTHESIA/SEDATION: Versed 1.5 mg IV; Fentanyl 50 mcg IV Moderate Sedation Time:  11 The patient was continuously monitored during the procedure by the interventional radiology nurse under my direct supervision. CONTRAST:  50mL OMNIPAQUE IOHEXOL 300 MG/ML SOLN - administered into the gastric lumen. FLUOROSCOPY: Fluoroscopy Time: 2 minutes 24 seconds (6 mGy). COMPLICATIONS: None immediate. PROCEDURE: Informed consent was obtained from the patient following explanation of the procedure, risks, benefits and alternatives. The patient understands, agrees and consents for the procedure. All questions were addressed. A time out was performed. Maximal barrier sterile technique utilized including caps, mask, sterile gowns, sterile gloves, large sterile drape, hand hygiene, and betadine prep. The left upper quadrant was sterilely prepped and draped. An oral gastric catheter was inserted into the stomach under fluoroscopy. The existing nasogastric feeding tube was removed. Air was injected into the stomach for insufflation and visualization under fluoroscopy. The air distended stomach was confirmed beneath the anterior abdominal wall in the frontal and lateral projections. Under sterile conditions and local anesthesia, a 36 gauge trocar needle was utilized to access the stomach  percutaneously beneath the left subcostal margin. Needle position was confirmed within the stomach under biplane fluoroscopy. Contrast injection confirmed position also. A single T tack was deployed for gastropexy. Over an Amplatz guide wire, a 9-French sheath was inserted into the stomach. A snare device was utilized to capture the oral gastric catheter. The snare device was pulled retrograde from the stomach up the esophagus and out the oropharynx. The 20-French pull-through gastrostomy was connected to the snare device and pulled antegrade through the oropharynx down the esophagus into the stomach and then through the percutaneous tract external to the patient. The gastrostomy was assembled externally. Contrast injection confirms position in the stomach. Images were obtained for documentation. The patient tolerated procedure well. No immediate complication. IMPRESSION: Fluoroscopic insertion of a 20-French "pull-through" gastrostomy. Electronically Signed   By: Jerilynn Mages.  Shick M.D.   On: 07/07/2022 11:09    Labs:  CBC: Recent Labs    07/03/22 0749 07/04/22 0029 07/05/22 0049 07/07/22 0021  WBC 13.8* 12.9* 12.9* 14.0*  HGB 7.5* 7.8* 7.9* 7.3*  HCT 23.6* 23.9* 24.1* 22.7*  PLT 575* 515* 511*  459*    COAGS: Recent Labs    12/24/21 1846 06/08/22 1132 07/02/22 0139  INR 1.1 1.3* 1.2  APTT  --  20*  --     BMP: Recent Labs    07/03/22 0749 07/04/22 0029 07/05/22 0049 07/07/22 0021  NA 136 137 135 139  K 4.9 4.3 4.2 3.8  CL 95* 95* 94* 97*  CO2 26 29 26 26   GLUCOSE 101* 84 93 116*  BUN 118* 69* 107* 120*  CALCIUM 9.0 9.1 9.3 10.1  CREATININE 5.79* 3.11* 4.38* 4.11*  GFRNONAA 10* 22* 15* 16*    LIVER FUNCTION TESTS: Recent Labs    06/27/22 0802 06/30/22 0051 07/01/22 0112 07/03/22 0749 07/05/22 0049 07/07/22 0021  BILITOT 0.4 0.5 0.3 0.4  --   --   AST 44* 62* 58* 38  --   --   ALT 24 39 36 13  --   --   ALKPHOS 83 117 122 103  --   --   PROT 6.2* 6.4* 6.3* 6.1*  --    --   ALBUMIN 1.6* <1.5* <1.5* <1.5* <1.5* <1.5*    Assessment and Plan: Dysphagia s/p G-tube placement 07/08/22 by Dr. Annamaria Boots Patient assessed at bedside after G-tube placement in IR yesterday.  TFs currently infusing without noted issue.  Site intact.  Abdominal binder in place for protection due to mental status.  Continue to use tube as needed.   Electronically Signed: Docia Barrier, PA 07/08/2022, 12:15 PM   I spent a total of 15 Minutes at the the patient's bedside AND on the patient's hospital floor or unit, greater than 50% of which was counseling/coordinating care for dysphagia.

## 2022-07-08 NOTE — NC FL2 (Addendum)
[place] LEVEL OF CARE FORM     IDENTIFICATION  Patient Name: Cody Macdonald Birthdate: Dec 01, 1960 Sex: male Admission Date (Current Location): 06/08/2022  [facility] and [place] Number:  Herbalist and Address:  [facility], [place] [address], [place], [place] [address]      Provider Number: [id number]  Attending Physician Name and Address:  Patrecia Pour, MD  Relative Name and Phone Number:  [name] [phone])  [phone] [facility])    Current Level of Care: Hospital Recommended Level of Care: [place] Prior Approval Number:    Date Approved/Denied:   PASRR Number: [id number]  Discharge Plan: SNF    Current Diagnoses: Patient Active Problem List   Diagnosis Date Noted   Protein-calorie malnutrition, severe 06/12/2022   Influenza A 06/09/2022   Acute respiratory failure with hypoxia ([place]) 06/09/2022   HCAP (healthcare-associated pneumonia) 06/08/2022   Hypertensive urgency 06/08/2022   Hypertension 04/05/2022   Hyperkalemia 04/05/2022   Pulmonary edema 04/05/2022   Volume overload 04/04/2022   Malnutrition of moderate degree 12/26/2021   ESRD (end stage renal disease) ([place]) 12/24/2021   Symptomatic anemia 12/24/2021   History of stroke 12/24/2021   Tobacco abuse 12/24/2021   Alcohol abuse 12/24/2021   Hypoalbuminemia 12/24/2021   AKI (acute kidney injury) ([place]) 12/24/2021   Abdominal distention 12/24/2021   Leg edema 12/24/2021    Orientation RESPIRATION BLADDER Height & Weight     Self  Normal Continent Weight: 187 lb 6.3 oz (85 kg) Height:  5\' 8"  (172.7 cm)  BEHAVIORAL SYMPTOMS/MOOD NEUROLOGICAL BOWEL NUTRITION STATUS      Incontinent Diet, Feeding tube (Tube feeds, see DC summary)  AMBULATORY STATUS COMMUNICATION OF NEEDS Skin Pressure injuries: Ischial tuberosity, right unstageable; ischial tuberosity, left, unstageable; buttocks left unstageable; right knee, stage 1; ischial tuberosity,  right, stage 2, buttocks, right, unstageable; buttocks left stage 2; heel left stage 2; foot left proximal deep tissue pressure injury; left ankle; coccyx mild deep tissue   Extensive Assist Verbally PU Stage and Appropriate Care                       Personal Care Assistance Level of Assistance  Feeding, Bathing, Dressing Bathing Assistance: Maximum assistance Feeding assistance: Maximum assistance Dressing Assistance: Maximum assistance     Functional Limitations Info  Sight, Hearing, Speech Sight Info: Impaired Hearing Info: Adequate Speech Info: Adequate    SPECIAL CARE FACTORS FREQUENCY                       Contractures Contractures Info: Not present    Additional Factors Info  Code Status, Allergies Code Status Info: DNR Allergies Info: no known allergies           Current Medications (07/08/2022):  This is the current hospital active medication list Current Facility-Administered Medications  Medication Dose Route Frequency Provider Last Rate Last Admin   acetaminophen (TYLENOL) suppository 650 mg  650 mg Rectal Q6H PRN Adefeso, Olad[place], DO   650 mg at 06/14/22 0232   acetaminophen (TYLENOL) tablet 650 mg  650 mg Per Tube Q6H PRN Raiford Noble Latif, DO   650 mg at 07/06/22 2140   albuterol (PROVENTIL) (2.5 MG/3ML) 0.083% nebulizer solution 2.5 mg  2.5 mg Nebulization Q3H PRN Shearon Stalls, Rahul P, PA-C   2.5 mg at 06/13/22 1250   Chlorhexidine Gluconate Cloth 2 % PADS 6 each  6 each Topical Q0600 Penninger, Ria Comment, [place]  6 each at 07/07/22 0518   Darbepoetin Alfa (ARANESP) injection 150 mcg  150 mcg Subcutaneous Q Thu-1800 Penninger, [place], [place]   150 mcg at 07/01/22 1935   docusate sodium (COLACE) capsule 100 mg  100 mg Oral BID PRN Germain Osgood, PA-C   100 mg at 06/23/22 1004   famotidine (PEPCID) tablet 20 mg  20 mg Per Tube Daily Raiford Noble Latif, DO   20 mg at 07/08/22 0932   feeding supplement (NEPRO CARB STEADY) liquid 1,000 mL  1,000 mL Per Tube  Continuous Barb Merino, MD 50 mL/hr at 07/08/22 0931 1,000 mL at 07/08/22 0931   feeding supplement (PROSource TF20) liquid 60 mL  60 mL Per Tube TID Barb Merino, MD   60 mL at 07/08/22 0931   fiber supplement (BANATROL TF) liquid 60 mL  60 mL Per Tube BID Barb Merino, MD   60 mL at 07/08/22 0931   guaiFENesin (ROBITUSSIN) 100 MG/5ML liquid 5 mL  5 mL Oral Q4H PRN Opyd, Ilene Qua, MD   5 mL at 06/21/22 0643   heparin injection 5,000 Units  5,000 Units Subcutaneous Q8H Monia Sabal, PA-C   5,000 Units at 07/08/22 0031   hydrALAZINE (APRESOLINE) injection 10-40 mg  10-40 mg Intravenous Q4H PRN Shearon Stalls, Rahul P, PA-C   20 mg at 06/09/22 0257   hydrOXYzine (ATARAX) 10 MG/5ML syrup 25 mg  25 mg Per Tube TID PRN Raiford Noble Latif, DO   25 mg at 06/23/22 2227   labetalol (NORMODYNE) injection 10 mg  10 mg Intravenous Q10 min PRN Carmin Muskrat, MD   10 mg at 06/08/22 2041   leptospermum manuka honey ([place]) paste 1 Application  1 Application Topical Daily Elodia Florence., MD   1 Application at 60/73/71 0941   multivitamin (RENA-VIT) tablet 1 tablet  1 tablet Per Tube QHS Raiford Noble [place], DO   1 tablet at 07/06/22 2140   nutrition supplement (JUVEN) (JUVEN) powder packet 1 packet  1 packet Per Tube BID BM Caren Griffins, MD   1 packet at 07/08/22 0932   Oral care mouth rinse  15 mL Mouth Rinse 4 times per day Brand Males, MD   15 mL at 07/08/22 0809   Oral care mouth rinse  15 mL Mouth Rinse PRN Brand Males, MD       oxyCODONE (ROXICODONE) 5 MG/5ML solution 5 mg  5 mg Per Tube Q6H PRN Barb Merino, MD       polyethylene glycol (MIRALAX / GLYCOLAX) packet 17 g  17 g Per Tube Daily PRN Alfredia Ferguson, Omair Latif, DO   17 g at 06/23/22 1003   thiamine (VITAMIN B1) tablet 100 mg  100 mg Per Tube Daily Barb Merino, MD   100 mg at 07/08/22 0932     Discharge Medications: Please see discharge summary for a list of discharge medications.  Relevant Imaging  Results:  Relevant Lab Results:   Additional Information SSn: [id number].  dialysis  [employer], LCSW

## 2022-07-08 NOTE — Progress Notes (Signed)
Patient back from hemodialysis

## 2022-07-08 NOTE — Progress Notes (Signed)
D/C order noted. Contacted [facility] in [place] to advise clinic of pt's d/c today and that pt should resume care tomorrow. Clinic also advised pt had a PEG placed this admission. Clinic has access to [facility] epic to obtain clinicals for continuation of care.   Melven Sartorius Renal Navigator [phone]

## 2022-07-08 NOTE — Progress Notes (Signed)
[facility] Progress Note   Subjective:    Seen and examined patient at bedside. In bed and appears comfortable. Had PEG placed yest AM.  Opens eyes to voice but not interacting.  No new issues per chart.   Objective Vitals:   07/07/22 2347 07/08/22 0025 07/08/22 0345 07/08/22 0705  BP: 127/68 130/61 (!) 130/58 (!) 122/59  Pulse: 93 94 96 97  Resp: (!) _0 Temp:  97.6 F (36.4 C) 97.8 F (36.6 C) 98.2 F (36.8 C)  TempSrc:  Oral Oral Oral  SpO2: 100% 100% 99% 100%  Weight:      Height:       Physical Exam General: chronically ill appearing, contracted and cachetic male in NAD Heart:+tachycardia Lungs:CTA anteriorly Abdomen:soft, NTND Extremities:no LE edema Dialysis Access: LU AVG +b/t   Filed Weights   07/05/22 1252 07/06/22 0016 07/07/22 0420  Weight: 83 kg 83 kg 85 kg    Intake/Output Summary (Last 24 hours) at 07/08/2022 1009 Last data filed at 07/07/2022 2337 Gross per 24 hour  Intake --  Output 800 ml  Net -800 ml     Additional Objective Labs: Basic Metabolic Panel: Recent Labs  Lab 07/04/22 0029 07/05/22 0049 07/07/22 0021  NA 137 135 139  K 4.3 4.2 3.8  CL 95* 94* 97*  CO2 _1 GLUCOSE 84 93 116*  BUN 69* 107* 120*  CREATININE 3.11* 4.38* 4.11*  CALCIUM 9.1 9.3 10.1  PHOS 3.5 3.8 2.8    Liver Function Tests: Recent Labs  Lab 07/03/22 0749 07/05/22 0049 07/07/22 0021  AST 38  --   --   ALT 13  --   --   ALKPHOS 103  --   --   BILITOT 0.4  --   --   PROT 6.1*  --   --   ALBUMIN <1.5* <1.5* <1.5*    No results for input(s): "LIPASE", "AMYLASE" in the last 168 hours. CBC: Recent Labs  Lab 07/02/22 0139 07/02/22 1825 07/03/22 0749 07/04/22 0029 07/05/22 0049 07/07/22 0021  WBC 12.0*  --  13.8* 12.9* 12.9* 14.0*  NEUTROABS  --   --  9.3*  --  7.9*  --   HGB 6.1*   < > 7.5* 7.8* 7.9* 7.3*  HCT 19.2*   < > 23.6* 23.9* 24.1* 22.7*  MCV 85.0  --  85.5 84.5 84.6 85.7  PLT 556*  --  575* 515* 511*  459*   < > = values in this interval not displayed.    Blood Culture    Component Value Date/Time   SDES BLOOD RIGHT FOREARM 06/27/2022 0802   SPECREQUEST  06/27/2022 0802    BOTTLES DRAWN AEROBIC ONLY Blood Culture results may not be optimal due to an inadequate volume of blood received in culture bottles   CULT  06/27/2022 0802    NO GROWTH 5 DAYS Performed at [facility], [place] [address] [place], [place] [address]    REPTSTATUS 07/02/2022 FINAL 06/27/2022 0802    Cardiac Enzymes: No results for input(s): "CKTOTAL", "CKMB", "CKMBINDEX", "TROPONINI" in the last 168 hours. CBG: Recent Labs  Lab 07/07/22 1617 07/07/22 1956 07/08/22 0024 07/08/22 0344 07/08/22 0821  GLUCAP 75 77 73 89 69*    Iron Studies: No results for input(s): "IRON", "TIBC", "TRANSFERRIN", "FERRITIN" in the last 72 hours. Lab Results  Component Value Date   INR 1.2 07/02/2022   INR 1.3 (H) 06/08/2022   INR 1.1 12/24/2021  Studies/Results: IR GASTROSTOMY TUBE MOD SED  Result Date: 07/07/2022 INDICATION: Pneumonia, dysphagia EXAM: FLUOROSCOPIC [place] Date:  07/07/2022 07/07/2022 9:07 am Radiologist:  M. Daryll Brod, MD Guidance:  Fluoroscopic MEDICATIONS: Ancef 2 g; Antibiotics were administered within 1 hour of the procedure. Glucagon 0.5 mg IV ANESTHESIA/SEDATION: Versed 1.5 mg IV; Fentanyl 50 mcg IV Moderate Sedation Time:  11 The patient was continuously monitored during the procedure by the interventional radiology nurse under my direct supervision. CONTRAST:  61m OMNIPAQUE IOHEXOL 300 MG/ML SOLN - administered into the gastric lumen. FLUOROSCOPY: Fluoroscopy Time: 2 minutes 24 seconds (6 mGy). COMPLICATIONS: None immediate. PROCEDURE: Informed consent was obtained from the patient following explanation of the procedure, risks, benefits and alternatives. The patient understands, agrees and consents for the procedure. All questions were addressed. A time out was  performed. Maximal barrier sterile technique utilized including caps, mask, sterile gowns, sterile gloves, large sterile drape, hand hygiene, and betadine prep. The left upper quadrant was sterilely prepped and draped. An oral gastric catheter was inserted into the stomach under fluoroscopy. The existing nasogastric feeding tube was removed. Air was injected into the stomach for insufflation and visualization under fluoroscopy. The air distended stomach was confirmed beneath the anterior abdominal wall in the frontal and lateral projections. Under sterile conditions and local anesthesia, a 127gauge trocar needle was utilized to access the stomach percutaneously beneath the left subcostal margin. Needle position was confirmed within the stomach under biplane fluoroscopy. Contrast injection confirmed position also. A single T tack was deployed for gastropexy. Over an Amplatz guide wire, a 9-French sheath was inserted into the stomach. A snare device was utilized to capture the oral gastric catheter. The snare device was pulled retrograde from the stomach up the esophagus and out the oropharynx. The 20-French pull-through gastrostomy was connected to the snare device and pulled antegrade through the oropharynx down the esophagus into the stomach and then through the percutaneous tract external to the patient. The gastrostomy was assembled externally. Contrast injection confirms position in the stomach. Images were obtained for documentation. The patient tolerated procedure well. No immediate complication. IMPRESSION: Fluoroscopic insertion of a 20-French "pull-through" gastrostomy. Electronically Signed   By: MJerilynn Mages  Shick M.D.   On: 07/07/2022 11:09    Medications:  feeding supplement (NEPRO CARB STEADY) 1,000 mL (07/08/22 0931)    Chlorhexidine Gluconate Cloth  6 each Topical Q0600   darbepoetin (ARANESP) injection - DIALYSIS  150 mcg Subcutaneous Q Thu-1800   famotidine  20 mg Per Tube Daily   feeding supplement  (PROSource TF20)  60 mL Per Tube TID   fiber supplement (BANATROL TF)  60 mL Per Tube BID   heparin  5,000 Units Subcutaneous Q8H   leptospermum manuka honey  1 Application Topical Daily   multivitamin  1 tablet Per Tube QHS   nutrition supplement (JUVEN)  1 packet Per Tube BID BM   mouth rinse  15 mL Mouth Rinse 4 times per day   thiamine  100 mg Per Tube Daily    [facility]  [facility]  2.2/5 bath  300/600  Hep none R AVG/TDC (AVG ok to use reportedly)   CXR 11/10- bilat splotchy perihilar disease, edema vs infxn, improved compared to 11/8 film  Assessment/Plan: 1.  Resolved acute hypoxic respiratory failure: Now stable on RA. Due to combination of pulmonary edema/volume overload with bronchospasm along with influenza +/- and probable HCAP.  Abx per primary.  2. Fever/leukocytosis - Afebrile  now.  Mild leukocytosis. BC w/NGTD.  CXR 11/27 & CT 11/28 w/possible PNA. Foul smelling pressure wounds.  [place] removed 07/02/22.  3. Altered mental status: per staff at [facility] is not unusual for him not to respond verbally at the OP unit.  4.  ESRD: on HD MWF. HD next tomorrow. 5.  HTN/ vol: Blood pressure mostly well controlled. minimal UF due to low po intake - follow UF needs after tube feeds started.   6.  Secondary hyperparathyroidism: Holding sevelamer as P < 3.0. Calcium in goal.   7.  Anemia of chronic disease: S/p 1 unit PRBCs 12/1 (total 3 units this admission). Aranesp raised  to 18mg weekly 12/1.  TSAT 12% - iron course started, then held d/t concern for acute infection, will restart when off ABX. Hgb now 7s. Transfuse for Hgb < 7. 9. Nutrition - on tube feeds - changed to Nepro.  PEG tube placement by IR on 12/6.  10.GOC - pt very frail, not interacting very much verbally. Pall care involved - met w son 12/4 - at this point DNR, cont other care, plans for PEG tube this admission then return to long term care facility with ongoing dialysis for now.    LJannifer HickMD [facility] Pager [phone]

## 2022-07-08 NOTE — Discharge Summary (Addendum)
Physician Discharge Summary   Patient: Cody Macdonald MRN: [id number] DOB: Jan 25, 1961  Admit date:     06/08/2022  Discharge date: 07/08/22  Discharge Physician: Patrecia Pour   PCP: Pcp, No   Recommendations at discharge:  Please monitor BP closely. His home norvasc 10mg , hydralazine 50mg  TID and labetalol 200mg  BID has been held and BPs are normal for many days. We will "discontinue" these at discharge, but would encourage reinitiation as needed at facility.  Continue palliative care following at facility. Note patient is DNR. Continue to offload pressure injuries. Continue tube feeds thru PEG placed 12/6. Continue routine HD Follow up regularly with MD at SNF.  Discharge Diagnoses: Principal Problem:   HCAP (healthcare-associated pneumonia) Active Problems:   ESRD (end stage renal disease) ([place])   Hypertensive urgency   Influenza A   Acute respiratory failure with hypoxia (HCC)   Protein-calorie malnutrition, severe  Hospital Course: 61 year old male with CVA, HTN, ESRD on HD MWF, sent from his SNF due to shortness of breath and hypertensive urgency.  He was hypoxic requiring up to 10 L nasal cannula and had pulmonary edema on the chest x-ray.  He was initially in the ICU, underwent emergent dialysis and required Cleviprex infusion for hypertensive urgency.  With improvement he was transferred to the hospitalist service on 11/10.  Hospital course complicated by poor mentation, persistent encephalopathy and failed swallow eval. Palliative care was consulted and multiple GOC were held, he is now DNR and family wishes for PEG tube.   Hospitalization complicated by fevers and worsening pressure wounds.  Pressure wounds thought to be source of infection.  Fevers currently improved.  PEG was delayed due to fevers and anemia.  Remains in very poor clinical status.  Did not agree for comfort care and hospice that was recommended. PEG was ultimately placed 12/6 facilitating discharge back to  nursing facility 12/7.  Assessment and Plan: Acute hypoxic respiratory failure due to multifocal pneumonia due to influenza A as well as superimposed acute pulmonary edema in the setting of fluid overload in a dialysis patient Respiratory status overall improving, currently he is on room air.  He completed a course of Tamiflu as well as empiric antibiotics, finished a 10-day course on 11/17.  With recurrence of fever, investigations including CT chest on pelvis with mild left lower lobe airspace opacity that is pneumonia versus atelectasis stable lucent lesion on the right iliac bone On third round of antibiotic therapy and currently remains on cefepime and vancomycin and Flagyl. Blood cultures NGTD. Tmax 100.80F. Leukocytosis continues, suspect inflammation related to pressure injuries. Has been treated x7 days (11/29 - 12/5). DC further antibiotics for now.  MRI pelvis 12/1 without any evidence of osteomyelitis or underlying collection. MRI left foot with soft tissue ulcers, no osseous changes to suggest osteomyelitis or soft tissue abscesses.   Hypertensive urgency -patient required ICU level of care for a Cleviprex drip, has improved significantly however. His home norvasc 10mg , hydralazine 50mg  TID and labetalol 200mg  BID has been held and BPs are normal. We will "discontinue" these at discharge, but would encourage reinitiation as needed at facility.    ESRD on HD-management as per nephrology. - Tunneled dialysis catheter removed on 12/1.  - Now getting dialysis through AV graft.   Hypophosphatemia, hypokalemia and hypomagnesemia, hyperkalemia-per renal.  Adequate.   Poor p.o. intake -Has been refusing to eat and not really eating very much.  Currently has Dobbhoff tube feeding.  Planning for PEG tube placement tomorrow.  H/o CVA -On Aspirin and Clopidogrel, on dysphagia 1 diet but not eating very well.   - Plavix and aspirin being held per IR. Will plan to restart 12/7.   Anemia of  Chronic Kidney Disease: S/p 3 unit pRBC. No obvious bleeding, will watch closely. Holding plavix and aspirin above for now. - ESA per nephrology, recheck H/H in AM.   Hypoalbuminemia -noted, undetectably low.   Acute Metabolic Encephalopathy-overall improved but still does not engage much.   Abnormal LFTs -minimal, improving   Underweight/ Severe Malnutrition in the Context of Chronic Illness/cachexia:  - Tube feeds initiated 12/7, will continue.   Pressure Ulcers, not present on admission See 11/28 note, reevaluation of wounds to sacrum L foot and L heel.  Has large foul smelling unstageable wounds on the L buttock close to anus, L ischium, coccyx. Stage 2 to R sacrum.  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  New DTPI to LL leg. Right iliac crest has DTPI.  - Planning for iodine.  Prevalon boot.  - Standard size bed with air mattress. - Surgery consulted, advised against surgery, consider hydrotherapy. Wound care RN and medical team feel the eschars currently in place are largely protective and since debriding these wounds would not lead to eventual healing, in fact would only cause more pain, we will treat noninvasively at this time. - Comfort measures were recommended but currently declined. Palliative following and should continue to follow at facility.     Goal of care: Severe medical debility.  Bedbound.  Contractures.  Multiple pressure ulcers not amenable to surgery or healing.  Frailty and debility failure to thrive.  Comfort care and hospice appropriate. Multiple goal of care discussions, palliative care is involved. DNR/DNI. Family wished for PEG tube feeding, placed 12/6 by IR Dr. Annamaria Boots. Cleared for full use starting 12/7. Back to nursing home after PEG tube placement.    Active Pressure Injury/Wound(s)     Pressure Ulcer  Duration          Pressure Injury 06/21/22 Ankle Left;Medial Deep Tissue Pressure Injury - Purple or maroon localized area of discolored intact skin or  blood-filled blister due to damage of underlying soft tissue from pressure and/or shear. dark purple, non blanchable 17 days   Pressure Injury 06/21/22 Ankle Left;Proximal Deep Tissue Pressure Injury - Purple or maroon localized area of discolored intact skin or blood-filled blister due to damage of underlying soft tissue from pressure and/or shear. dark purple non blanchable 17 days   Pressure Injury 06/21/22 Coccyx Mid Deep Tissue Pressure Injury - Purple or maroon localized area of discolored intact skin or blood-filled blister due to damage of underlying soft tissue from pressure and/or shear. 17 days   Pressure Injury 06/21/22 Foot Left;Medial;Distal Deep Tissue Pressure Injury - Purple or maroon localized area of discolored intact skin or blood-filled blister due to damage of underlying soft tissue from pressure and/or shear. non blanchable area, dark 17 days   Pressure Injury 06/21/22 Foot Left;Proximal Deep Tissue Pressure Injury - Purple or maroon localized area of discolored intact skin or blood-filled blister due to damage of underlying soft tissue from pressure and/or shear. dark purple non blanchable 17 days   Pressure Injury 06/21/22 Heel Left Stage 2 -  Partial thickness loss of dermis presenting as a shallow open injury with a red, pink wound bed without slough. 17 days   Pressure Injury 06/24/22 Buttocks Left Stage 2 -  Partial thickness loss of dermis presenting as a shallow open injury with a  red, pink wound bed without slough. diffuse stage two areas that extend from left buttocks to part of sacrum to scrotum to rectu 14 days   Pressure Injury 06/29/22 Buttocks Right Unstageable - Full thickness tissue loss in which the base of the injury is covered by slough (yellow, tan, gray, green or brown) and/or eschar (tan, brown or black) in the wound bed. 9 days   Pressure Injury 06/29/22 Ischial tuberosity Right Stage 2 -  Partial thickness loss of dermis presenting as a shallow open injury with  a red, pink wound bed without slough. 9 days   Pressure Injury 06/30/22 Knee Right;Left Stage 1 -  Intact skin with non-blanchable redness of a localized area usually over a bony prominence. 7 days   Pressure Injury 07/05/22 Buttocks Left Unstageable - Full thickness tissue loss in which the base of the injury is covered by slough (yellow, tan, gray, green or brown) and/or eschar (tan, brown or black) in the wound bed. L illium 2 days   Pressure Injury 07/05/22 Ischial tuberosity Left Unstageable - Full thickness tissue loss in which the base of the injury is covered by slough (yellow, tan, gray, green or brown) and/or eschar (tan, brown or black) in the wound bed. L ischial 2 days   Pressure Injury 07/05/22 Ischial tuberosity Right Unstageable - Full thickness tissue loss in which the base of the injury is covered by slough (yellow, tan, gray, green or brown) and/or eschar (tan, brown or black) in the wound bed. R ischium 2 days           Consultants: PCCM, nephrology, palliative care, wound care, IR, general surgery Procedures performed:  12/6 Dr. Annamaria Boots: fluoro 20 fr Gtube   12/1: Successful removal of tunneled hemodialysis catheter.  Disposition: Long term care facility Diet recommendation: NPO, tube feeds as above DISCHARGE MEDICATION: Allergies as of 07/08/2022   No Known Allergies      Medication List     STOP taking these medications    amLODipine 10 MG tablet Commonly known as: NORVASC   aspirin EC 81 MG tablet Replaced by: aspirin 81 MG chewable tablet   ergocalciferol 1.25 MG (50000 UT) capsule Commonly known as: VITAMIN D2   feeding supplement Liqd Replaced by: nutrition supplement (JUVEN) Pack You also have another medication with the same name that you need to continue taking as instructed.   hydrALAZINE 50 MG tablet Commonly known as: APRESOLINE   labetalol 300 MG tablet Commonly known as: NORMODYNE   MULTIPLE VITAMIN PO   pantoprazole 40 MG  tablet Commonly known as: PROTONIX   UNABLE TO FIND       TAKE these medications    acetaminophen 325 MG tablet Commonly known as: TYLENOL Place 2 tablets (650 mg total) into feeding tube every 6 (six) hours as needed for mild pain. What changed: how to take this   albuterol (2.5 MG/3ML) 0.083% nebulizer solution Commonly known as: PROVENTIL Take 3 mLs (2.5 mg total) by nebulization every 6 (six) hours as needed for wheezing or shortness of breath.   aspirin 81 MG chewable tablet Commonly known as: Aspirin Childrens Place 1 tablet (81 mg total) into feeding tube daily. Replaces: aspirin EC 81 MG tablet   atorvastatin 80 MG tablet Commonly known as: LIPITOR Place 1 tablet (80 mg total) into feeding tube at bedtime. What changed: how to take this   clopidogrel 75 MG tablet Commonly known as: PLAVIX Place 1 tablet (75 mg total) into feeding tube daily. What changed:  how to take this   famotidine 20 MG tablet Commonly known as: PEPCID Place 1 tablet (20 mg total) into feeding tube daily. Start taking on: July 09, 2022   feeding supplement (PROSource TF20) liquid Place 60 mLs into feeding tube 3 (three) times daily.   fiber supplement (BANATROL TF) liquid Place 60 mLs into feeding tube 2 (two) times daily.   leptospermum manuka honey Pste paste Apply 1 Application topically daily. Apply to sacral wound daily Apply thin layer (3 mm) to wound. Start taking on: July 09, 2022   melatonin 3 MG Tabs tablet Place 1 tablet (3 mg total) into feeding tube at bedtime. What changed: how to take this   multivitamin Tabs tablet Place 1 tablet into feeding tube at bedtime. What changed: how to take this   nutrition supplement (JUVEN) Pack Place 1 packet into feeding tube 2 (two) times daily between meals. What changed: You were already taking a medication with the same name, and this prescription was added. Make sure you understand how and when to take each. Replaces:  feeding supplement Liqd   feeding supplement (NEPRO CARB STEADY) Liqd Place 1,000 mLs into feeding tube continuous. 2ml/hr What changed:  how much to take how to take this when to take this additional instructions Another medication with the same name was removed. Continue taking this medication, and follow the directions you see here.   sevelamer carbonate 800 MG tablet Commonly known as: RENVELA Place 1 tablet (800 mg total) into feeding tube 3 (three) times daily with meals. What changed: how to take this   thiamine 100 MG tablet Commonly known as: Vitamin B-1 Place 1 tablet (100 mg total) into feeding tube daily. Start taking on: July 09, 2022        Discharge Exam: Danley Danker Weights   07/05/22 1252 07/06/22 0016 07/07/22 0420  Weight: 83 kg 83 kg 85 kg  BP (!) 122/59 (BP Location: Right Wrist)   Pulse 97   Temp 98.2 F (36.8 C) (Oral)   Resp 20   Ht 5\' 8"  (1.727 m)   Wt 85 kg   SpO2 100%   BMI 28.49 kg/m   Tracks with eyes, alert, occasional grunts to some questions but no verbalizations. Severely contracted and cachectic with foam pad dressings on multiple wounds without surrounding erythema. Clear and nonlabored on room air. RRR, no edema. PEG site is c/d/i  Condition at discharge: stable  The results of significant diagnostics from this hospitalization (including imaging, microbiology, ancillary and laboratory) are listed below for reference.   Imaging Studies: IR GASTROSTOMY TUBE MOD SED  Result Date: 07/07/2022 INDICATION: Pneumonia, dysphagia EXAM: FLUOROSCOPIC [place] Date:  07/07/2022 07/07/2022 9:07 am Radiologist:  M. Daryll Brod, MD Guidance:  Fluoroscopic MEDICATIONS: Ancef 2 g; Antibiotics were administered within 1 hour of the procedure. Glucagon 0.5 mg IV ANESTHESIA/SEDATION: Versed 1.5 mg IV; Fentanyl 50 mcg IV Moderate Sedation Time:  11 The patient was continuously monitored during the procedure by the interventional  radiology nurse under my direct supervision. CONTRAST:  21mL OMNIPAQUE IOHEXOL 300 MG/ML SOLN - administered into the gastric lumen. FLUOROSCOPY: Fluoroscopy Time: 2 minutes 24 seconds (6 mGy). COMPLICATIONS: None immediate. PROCEDURE: Informed consent was obtained from the patient following explanation of the procedure, risks, benefits and alternatives. The patient understands, agrees and consents for the procedure. All questions were addressed. A time out was performed. Maximal barrier sterile technique utilized including caps, mask, sterile gowns, sterile gloves, large sterile drape, hand hygiene,  and betadine prep. The left upper quadrant was sterilely prepped and draped. An oral gastric catheter was inserted into the stomach under fluoroscopy. The existing nasogastric feeding tube was removed. Air was injected into the stomach for insufflation and visualization under fluoroscopy. The air distended stomach was confirmed beneath the anterior abdominal wall in the frontal and lateral projections. Under sterile conditions and local anesthesia, a 56 gauge trocar needle was utilized to access the stomach percutaneously beneath the left subcostal margin. Needle position was confirmed within the stomach under biplane fluoroscopy. Contrast injection confirmed position also. A single T tack was deployed for gastropexy. Over an Amplatz guide wire, a 9-French sheath was inserted into the stomach. A snare device was utilized to capture the oral gastric catheter. The snare device was pulled retrograde from the stomach up the esophagus and out the oropharynx. The 20-French pull-through gastrostomy was connected to the snare device and pulled antegrade through the oropharynx down the esophagus into the stomach and then through the percutaneous tract external to the patient. The gastrostomy was assembled externally. Contrast injection confirms position in the stomach. Images were obtained for documentation. The patient  tolerated procedure well. No immediate complication. IMPRESSION: Fluoroscopic insertion of a 20-French "pull-through" gastrostomy. Electronically Signed   By: Jerilynn Mages.  Shick M.D.   On: 07/07/2022 11:09   IR Removal Tun Cv Cath W/O FL  Result Date: 07/02/2022 INDICATION: End-stage renal disease on hemodialysis. Now with functional arteriovenous fistula. Request for removal of tunneled hemodialysis catheter. It was initially placed on Dec 25, 2021 by Dr. Dwaine Gale. EXAM: REMOVAL OF TUNNELED HEMODIALYSIS CATHETER MEDICATIONS: 1% lidocaine 4 mL COMPLICATIONS: None immediate. PROCEDURE: Informed written consent was obtained from the patient following an explanation of the procedure, risks, benefits and alternatives to treatment. A time out was performed prior to the initiation of the procedure. Maximal barrier sterile technique was utilized including caps, mask, sterile gowns, sterile gloves, large sterile drape, and hand hygiene. ChloraPrep was used to prep the patient's right neck, chest and existing catheter. The cuff was palpated about 2 inches above the exit site of the catheter. 1% lidocaine was injected around the cuff of the catheter . Using a #15 blade, a small incision was made over the cuff. The catheter was dissected out using scissors and curved hemostats until the cuff was freed from the surrounding fibrous sheath and the catheter was removed without difficulty. Next 3-0 Vicryl suture was used to approximate on the skin only at the incision site. Sterile dressing was placed. The patient tolerated the procedure well without immediate post procedural complication. IMPRESSION: Successful removal of tunneled dialysis catheter. Procedure performed by: Gareth Eagle, PA-C Electronically Signed   By: Ruthann Cancer M.D.   On: 07/02/2022 16:46   MR FOOT LEFT WO CONTRAST  Result Date: 07/02/2022 CLINICAL DATA:  Soft tissue infection suspected, foot, xray done EXAM: MRI OF THE LEFT FOOT WITHOUT CONTRAST TECHNIQUE:  Multiplanar, multisequence MR imaging of the left foot was performed. No intravenous contrast was administered. COMPARISON:  CT foot 07/01/2022. FINDINGS: There is failure of fat saturation/fat inversion, severely limiting interpretation of this MRI. Bones/Joint/Cartilage There is no evidence of bony erosion or frank bony destruction. There is patchy low T1 signal in the hindfoot and midfoot, which is nonspecific, but likely related to patchy osteopenia. No marrow signal change to suggest osteomyelitis on this limited MRI. Ligaments Lisfranc ligament appears intact. Muscles and Tendons No acute tendon tear.  Diffuse intramuscular edema. Soft tissues Diffuse soft tissue swelling. Soft tissue  ulcers along the medial heel and medial forefoot adjacent to the first metatarsal head. No evidence of well-defined fluid collection on this limited MRI. There is a blister-like skin lesion overlying the dorsal medial midfoot (series 9, images 29-33). IMPRESSION: Failure of fat saturation/fat inversion, severely limiting interpretation of this MRI. Soft tissue ulcers along the medial heel and medial forefoot adjacent to the first metatarsal head, without obvious underlying osseous change to suggest osteomyelitis or soft tissue abscess on this limited MRI. Blister-like skin lesion overlying the dorsal medial midfoot noted. Electronically Signed   By: Maurine Simmering M.D.   On: 07/02/2022 08:52   MR PELVIS WO CONTRAST  Result Date: 07/02/2022 CLINICAL DATA:  Osteomyelitis suspected, pelvis, xray done r/o osteo EXAM: MRI PELVIS WITHOUT CONTRAST TECHNIQUE: Multiplanar multisequence MR imaging of the pelvis was performed. No intravenous contrast was administered. COMPARISON:  CT 06/23/2022, CT 12/25/2021 FINDINGS: There is failure of fat saturation/fat inversion despite multiple attempts, which severely limits interpretation of this MRI. Bones/Joint/Cartilage There is marrow reconversion change throughout the lower lumbar spine and  pelvis. There is no obvious displaced fracture. There is well-defined sclerotic lesion within the left iliac bone seen on prior CT, nonspecific but could be a bone infarct. Lytic lesions noted on prior CT in the pelvis are poorly evaluated due to marrow reconversion changes. These lesions are unchanged since at least May 2023. There are degenerative endplate changes at Y1-V4 and L5-S1. There is preserved fatty marrow signal within the coccyx and lower sacrum without obvious bony destruction. Ligaments Suboptimally assessed by CT. Muscles and Tendons There is diffuse intramuscular edema and loss of muscle bulk. No obvious intramuscular collection on noncontrast MRI. Soft tissues There is a sacral decubitus ulcer as seen on recent CT, extending towards the surface of the coccyx and S5 vertebrae posteriorly but there are no obvious underlying osseous changes. There is a superficial right gluteal/ischial ulcer. Diffuse body wall edema. Miscellaneous Trace free fluid in the pelvis. The bladder is mildly distended with layering intraluminal material along the left aspect posteriorly. IMPRESSION: Failure of fat saturation/fat inversion despite multiple attempts, severely limiting interpretation of this MRI. Sacral decubitus ulcer at the sacrococcygeal junction without obvious underlying marrow signal change to suggest osteomyelitis or evidence of abscess on this limited MRI. Additional superficial right gluteal/ischial ulcer noted. Marrow reconversion changes in the lower lumbar spine and pelvis, as can be seen in hematologic derangement such as anemia. Diffuse intramuscular and body wall edema. Electronically Signed   By: Maurine Simmering M.D.   On: 07/02/2022 08:40   CT FOOT LEFT WO CONTRAST  Result Date: 07/01/2022 CLINICAL DATA:  Foot wounds EXAM: CT OF THE LEFT FOOT WITHOUT CONTRAST TECHNIQUE: Multidetector CT imaging of the left foot was performed according to the standard protocol. Multiplanar CT image  reconstructions were also generated. RADIATION DOSE REDUCTION: This exam was performed according to the departmental dose-optimization program which includes automated exposure control, adjustment of the mA and/or kV according to patient size and/or use of iterative reconstruction technique. COMPARISON:  None Available. FINDINGS: Bones/Joint/Cartilage Osseous structures appear diffusely demineralized. No evidence of fracture or dislocation. Mild joint space narrowing is most notable at the first MTP joint. No focal bone erosion or periosteal elevation. Ligaments Suboptimally assessed by CT. Muscles and Tendons Atrophic appearance of the musculature. No appreciable tendon abnormality by CT. Soft tissues Lack of subcutaneous fat. Possible wound in the region of the left heel and medial malleolus. Generalized subcutaneous edema. No organized fluid collection. No soft tissue  gas. IMPRESSION: 1. Possible wound in the region of the left heel and medial malleolus. Generalized subcutaneous edema without organized fluid collection or soft tissue gas. 2. No acute osseous abnormality.  No CT evidence of osteomyelitis. Electronically Signed   By: Davina Poke D.O.   On: 07/01/2022 11:16   CT CHEST ABDOMEN PELVIS W CONTRAST  Result Date: 06/29/2022 CLINICAL DATA:  Sepsis. EXAM: CT CHEST, ABDOMEN, AND PELVIS WITH CONTRAST TECHNIQUE: Multidetector CT imaging of the chest, abdomen and pelvis was performed following the standard protocol during bolus administration of intravenous contrast. RADIATION DOSE REDUCTION: This exam was performed according to the departmental dose-optimization program which includes automated exposure control, adjustment of the mA and/or kV according to patient size and/or use of iterative reconstruction technique. CONTRAST:  57mL OMNIPAQUE IOHEXOL 350 MG/ML SOLN COMPARISON:  May 26, 2022. FINDINGS: CT CHEST FINDINGS Cardiovascular: No significant vascular findings. Normal heart size. No  pericardial effusion. Mediastinum/Nodes: No enlarged mediastinal, hilar, or axillary lymph nodes. Thyroid gland, trachea, and esophagus demonstrate no significant findings. Lungs/Pleura: No pneumothorax or pleural effusion is noted. Left lower lobe airspace opacity is noted concerning for infiltrate or atelectasis. 4 mm nodule is noted in right lung apex best seen on image number 41 of series 4. Minimal right basilar subsegmental atelectasis is noted. Musculoskeletal: No chest wall mass or suspicious bone lesions identified. CT ABDOMEN PELVIS FINDINGS Hepatobiliary: No focal liver abnormality is seen. No gallstones, gallbladder wall thickening, or biliary dilatation. Pancreas: Unremarkable. No pancreatic ductal dilatation or surrounding inflammatory changes. Spleen: Normal in size without focal abnormality. Adrenals/Urinary Tract: Adrenal glands are unremarkable. Kidneys are normal, without renal calculi, focal lesion, or hydronephrosis. Bladder is unremarkable. Stomach/Bowel: Distal tip of feeding tube is seen in distal stomach. The appendix is unremarkable. Residual stool and contrast is noted throughout the nondilated colon. There is no evidence of bowel obstruction. Vascular/Lymphatic: Aortic atherosclerosis. No enlarged abdominal or pelvic lymph nodes. 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 Reproductive: Prostate gland not well visualized. Other: No abdominal wall hernia or abnormality. No abdominopelvic ascites. Musculoskeletal: Stable lucent lesions are noted in the right iliac bones which may be benign, but metastatic disease cannot be excluded. IMPRESSION: Mild left lower lobe airspace opacity is noted concerning for pneumonia or atelectasis. Minimal right basilar subsegmental atelectasis is noted. 4 mm nodule is noted in right lung apex. No follow-up needed if patient is low-risk.This recommendation follows the consensus statement: Guidelines for  Management of Incidental Pulmonary Nodules Detected on CT Images: From the Fleischner Society 2017; Radiology 2017; 284:228-243. Stable lucent lesions are noted in the right iliac bones which may be benign, but metastatic disease cannot be excluded. If there is clinical concern for metastatic disease, bone scan can be performed further evaluation. No other significant abnormality seen in the abdomen or pelvis. Aortic Atherosclerosis (ICD10-I70.0). Electronically Signed   By: Marijo Conception M.D.   On: 06/29/2022 13:40   DG CHEST PORT 1 VIEW  Result Date: 06/27/2022 CLINICAL DATA:  Fever. EXAM: PORTABLE CHEST 1 VIEW COMPARISON:  06/23/2022 and prior radiographs FINDINGS: Patient is rotated and leaning to the LEFT. A RIGHT IJ central venous catheter with tip overlying the LOWER SVC and small bore feeding tube entering the stomach with tip off the field of view again noted. Probable retrocardiac LEFT LOWER lung opacity noted suspicious for airspace disease/pneumonia. Mild bilateral interstitial opacities are again noted with probable trace RIGHT pleural effusion. There is no evidence of  pneumothorax or acute bony abnormality. The cardiomediastinal silhouette is unchanged given technique. IMPRESSION: Probable retrocardiac LEFT LOWER lung opacity suspicious for airspace disease/pneumonia. Electronically Signed   By: Margarette Canada M.D.   On: 06/27/2022 10:35   DG CHEST PORT 1 VIEW  Result Date: 06/23/2022 CLINICAL DATA:  Shortness of breath EXAM: PORTABLE CHEST 1 VIEW COMPARISON:  Radiograph 06/22/2022 FINDINGS: Right neck catheter tip overlies the mid SVC. Unchanged cardiomediastinal silhouette. Feeding tube passes below the diaphragm, tip excluded by collimation. Mild perihilar predominant interstitial opacities, unchanged from prior. No new airspace disease. No pleural effusion or pneumothorax. Bones are unchanged. IMPRESSION: Mild perihilar predominant interstitial opacities, unchanged from prior, could be  pulmonary edema or atypical infection. No new airspace disease. Electronically Signed   By: Maurine Simmering M.D.   On: 06/23/2022 08:36   DG CHEST PORT 1 VIEW  Result Date: 06/22/2022 CLINICAL DATA:  Shortness of breath. EXAM: PORTABLE CHEST 1 VIEW COMPARISON:  Chest radiograph dated 06/21/2022. FINDINGS: With sided Port-A-Cath with tip over central SVC. Feeding tube extends below the diaphragm with tip beyond the inferior margin of the image. Mild diffuse interstitial prominence and perihilar streaky densities may represent edema. Developing infiltrate is not excluded clinical correlation recommended. No consolidative changes with there is no pleural effusion pneumothorax. Stable cardiac silhouette. No acute osseous pathology. IMPRESSION: Mild diffuse interstitial prominence and perihilar streaky densities may represent edema or developing infiltrate. Electronically Signed   By: Anner Crete M.D.   On: 06/22/2022 19:31   DG CHEST PORT 1 VIEW  Result Date: 06/21/2022 CLINICAL DATA:  Shortness of breath. In stage kidney disease. Hypertension. Stroke EXAM: PORTABLE CHEST 1 VIEW COMPARISON:  06/20/2022 FINDINGS: Right IJ catheter tip is in the projection of the distal SVC. There is a feeding tube with tip below the level of the hemidiaphragms. Stable cardiomediastinal contours. Lungs appear hyperinflated. IMPRESSION: No active disease. Electronically Signed   By: Kerby Moors M.D.   On: 06/21/2022 05:47   DG CHEST PORT 1 VIEW  Result Date: 06/20/2022 CLINICAL DATA:  Short of breath EXAM: PORTABLE CHEST 1 VIEW COMPARISON:  Prior chest x-ray 06/18/2022 FINDINGS: Right IJ tunneled hemodialysis catheter. Tip overlies the mid SVC. Stable cardiac and mediastinal contours. Slightly increased interstitial airspace opacities in the perihilar distribution bilaterally consistent with edema or fluid overload. No pneumothorax. No focal airspace infiltrate. Partially imaged feeding tube. The tip lies below the  diaphragm, likely within the stomach or small bowel. IMPRESSION: 1. Perihilar interstitial airspace opacities consistent with pulmonary edema versus volume overload. 2. Well-positioned right IJ tunneled hemodialysis catheter. Electronically Signed   By: Jacqulynn Cadet M.D.   On: 06/20/2022 07:43   DG Abd Portable 1V  Result Date: 06/18/2022 CLINICAL DATA:  NG tube placement EXAM: PORTABLE ABDOMEN - 1 VIEW COMPARISON:  K[facility] 12/24/2021, CT abdomen/pelvis 05/26/2022 FINDINGS: The enteric catheter tip is in the distal stomach. There is enteric contrast in the colon. There is a nonobstructive bowel gas pattern. There is no definite free intraperitoneal air. There is no acute osseous abnormality. IMPRESSION: 1. Enteric catheter tip in the stomach. 2. Enteric contrast in the colon.  Nonobstructive bowel gas pattern. Electronically Signed   By: Valetta Mole M.D.   On: 06/18/2022 15:58   DG CHEST PORT 1 VIEW  Result Date: 06/18/2022 CLINICAL DATA:  [id number] SOB (shortness of breath) [id number] EXAM: PORTABLE CHEST 1 VIEW COMPARISON:  06/16/2022 chest radiograph. FINDINGS: Right internal jugular central venous catheter terminates in the middle third of the SVC.  Surgical clips overlie the medial lower left chest. Stable cardiomediastinal silhouette with normal heart size. No pneumothorax. No pleural effusion. Slight prominence of the parahilar interstitial markings, slightly improved. IMPRESSION: Slight prominence of the parahilar interstitial markings, slightly improved, favor resolving mild pulmonary edema. Electronically Signed   By: Ilona Sorrel M.D.   On: 06/18/2022 08:19   DG CHEST PORT 1 VIEW  Result Date: 06/16/2022 CLINICAL DATA:  Dyspnea, cough, and congestion. End-stage renal disease. EXAM: PORTABLE CHEST 1 VIEW COMPARISON:  Chest radiograph 06/11/2022 FINDINGS: A right jugular dialysis catheter terminates over the lower SVC, unchanged. The cardiac silhouette is normal in size. Bilateral perihilar  airspace opacities demonstrate further mild improvement. No sizable pleural effusion or pneumothorax is identified. No acute osseous abnormality is seen. IMPRESSION: Further mild improvement of bilateral perihilar opacities which may reflect edema or infection. Electronically Signed   By: Logan Bores M.D.   On: 06/16/2022 08:23   DG CHEST PORT 1 VIEW  Result Date: 06/11/2022 CLINICAL DATA:  Respiratory distress, dyspnea, prior are normal chest x-ray EXAM: PORTABLE CHEST 1 VIEW COMPARISON:  06/09/2022 FINDINGS: Single frontal view of the chest demonstrates stable right internal jugular dialysis catheter. Stable cardiac silhouette. Bilateral perihilar airspace disease again identified, slightly improved since prior exam. No effusion or pneumothorax. No acute bony abnormalities. IMPRESSION: 1. Persistent but improving bilateral perihilar airspace disease, which may reflect resolving infection or edema. Electronically Signed   By: Randa Ngo M.D.   On: 06/11/2022 21:49   DG Chest Port 1 View  Result Date: 06/09/2022 CLINICAL DATA:  61 year old male with respiratory failure, sepsis. End stage renal disease. EXAM: PORTABLE CHEST 1 VIEW COMPARISON:  Portable chest 06/08/2022 and earlier. FINDINGS: Portable AP semi upright view at 0417 hours. Stable right chest dual lumen dialysis type catheter. Larger lung volumes. But increasing confluence of perihilar and widespread right greater than left lung opacity. Multilobar involvement suspected bilaterally, and early consolidation suspected now. No superimposed pneumothorax. Small right pleural effusion appears stable. Pulmonary vascularity in the affected areas appears relatively normal. Stable mild cardiomegaly and other mediastinal contours. No acute osseous abnormality identified. Paucity of bowel gas in the upper abdomen. IMPRESSION: 1. Progressed bilateral multilobar airspace opacity now with early consolidation suspected. Differential considerations include  asymmetric pulmonary edema, bilateral pneumonia, developing ARDS. 2. Stable mild cardiomegaly and small right pleural effusion. Electronically Signed   By: Genevie Ann M.D.   On: 06/09/2022 05:03   DG Chest Port 1 View  Result Date: 06/08/2022 CLINICAL DATA:  Sepsis.  Shortness of breath. EXAM: PORTABLE CHEST 1 VIEW COMPARISON:  05/26/2022 FINDINGS: There is a right chest wall dialysis catheter with tips at the superior cavoatrial junction. Stable cardiomediastinal contours. There is a small right pleural effusion. Mild diffuse interstitial edema identified. Bilateral airspace opacities are identified which may represent areas of superimposed pneumonia or asymmetric pulmonary edema. Visualized osseous structures are unremarkable. IMPRESSION: 1. Small right pleural effusion and diffuse interstitial edema. 2. Bilateral airspace opacities compatible with superimposed pneumonia or asymmetric pulmonary edema. Electronically Signed   By: Kerby Moors M.D.   On: 06/08/2022 12:20    Microbiology: Results for orders placed or performed during the hospital encounter of 06/08/22  Culture, blood (Routine x 2)     Status: None   Collection Time: 06/08/22 11:24 AM   Specimen: BLOOD RIGHT HAND  Result Value Ref Range Status   Specimen Description   Final    BLOOD RIGHT HAND Performed at [facility]  [address], [place], [place] [address]    Special Requests   Final    BOTTLES DRAWN AEROBIC AND ANAEROBIC Blood Culture adequate volume Performed at [facility] [address], [place], [place] [address]    Culture   Final    NO GROWTH 5 DAYS Performed at [facility], [place] [address] [place] [address] [place], [place] [address]    Report Status 06/13/2022 FINAL  Final  Culture, blood (Routine x 2)     Status: None   Collection Time: 06/08/22 11:24 AM   Specimen: Right Antecubital; Blood  Result Value Ref Range Status   Specimen Description   Final    RIGHT ANTECUBITAL  BLOOD Performed at [facility], [place] [address], [place], [place] [address]    Special Requests   Final    BOTTLES DRAWN AEROBIC AND ANAEROBIC Blood Culture adequate volume Performed at [facility] [address], [place], [place] [address]    Culture   Final    NO GROWTH 5 DAYS Performed at [facility], [place] [address] [place], [place] [address]    Report Status 06/13/2022 FINAL  Final  Resp Panel by RT-PCR (Flu A&B, Covid) Anterior Nasal Swab     Status: Abnormal   Collection Time: 06/08/22 11:36 AM   Specimen: Anterior Nasal Swab  Result Value Ref Range Status   SARS Coronavirus 2 by RT PCR NEGATIVE NEGATIVE Final    Comment: (NOTE) SARS-CoV-2 target nucleic acids are NOT DETECTED.  The SARS-CoV-2 RNA is generally detectable in upper respiratory specimens during the acute phase of infection. The lowest concentration of SARS-CoV-2 viral copies this assay can detect is 138 copies/mL. A negative result does not preclude SARS-Cov-2 infection and should not be used as the sole basis for treatment or other patient management decisions. A negative result may occur with  improper specimen collection/handling, submission of specimen other than nasopharyngeal swab, presence of viral mutation(s) within the areas targeted by this assay, and inadequate number of viral copies(<138 copies/mL). A negative result must be combined with clinical observations, patient history, and epidemiological information. The expected result is Negative.  Fact Sheet for Patients:  EntrepreneurPulse.com.au  Fact Sheet for Healthcare Providers:  IncredibleEmployment.be  This test is no t yet approved or cleared by the [place] FDA and  has been authorized for detection and/or diagnosis of SARS-CoV-2 by FDA under an Emergency Use Authorization (EUA). This EUA will remain  in effect (meaning this test can be used) for the duration of  the COVID-19 declaration under Section 564(b)(1) of the Act, 21 U.S.C.section 360bbb-3(b)(1), unless the authorization is terminated  or revoked sooner.       Influenza A by PCR POSITIVE (A) NEGATIVE Final   Influenza B by PCR NEGATIVE NEGATIVE Final    Comment: (NOTE) The Xpert Xpress SARS-CoV-2/FLU/RSV plus assay is intended as an aid in the diagnosis of influenza from Nasopharyngeal swab specimens and should not be used as a sole basis for treatment. Nasal washings and aspirates are unacceptable for Xpert Xpress SARS-CoV-2/FLU/RSV testing.  Fact Sheet for Patients: EntrepreneurPulse.com.au  Fact Sheet for Healthcare Providers: IncredibleEmployment.be  This test is not yet approved or cleared by the [place] FDA and has been authorized for detection and/or diagnosis of SARS-CoV-2 by FDA under an Emergency Use Authorization (EUA). This EUA will remain in effect (meaning this test can be used) for the duration of the COVID-19 declaration under Section 564(b)(1) of the Act, 21 U.S.C. section  360bbb-3(b)(1), unless the authorization is terminated or revoked.  Performed at [facility], [place] [address], [place], [place] [address]   MRSA Next Gen by PCR, Nasal     Status: None   Collection Time: 06/08/22  7:01 PM   Specimen: Nasal Mucosa; Nasal Swab  Result Value Ref Range Status   MRSA by PCR Next Gen NOT DETECTED NOT DETECTED Final    Comment: (NOTE) The GeneXpert MRSA Assay (FDA approved for NASAL specimens only), is one component of a comprehensive MRSA colonization surveillance program. It is not intended to diagnose MRSA infection nor to guide or monitor treatment for MRSA infections. Test performance is not FDA approved in patients less than 46 years old. Performed at [facility], [place] [address] [place], [place] [address]   Culture, blood (Routine X 2) w Reflex to ID Panel     Status: None    Collection Time: 06/14/22 10:26 AM   Specimen: BLOOD  Result Value Ref Range Status   Specimen Description   Final    BLOOD BLOOD RIGHT ARM AEROBIC BOTTLE ONLY ANAEROBIC BOTTLE ONLY   Special Requests   Final    BOTTLES DRAWN AEROBIC AND ANAEROBIC Blood Culture adequate volume   Culture   Final    NO GROWTH 5 DAYS Performed at [facility], [place] [address] [place], [place] [address]    Report Status 06/19/2022 FINAL  Final  Culture, blood (Routine X 2) w Reflex to ID Panel     Status: None   Collection Time: 06/14/22 10:26 AM   Specimen: BLOOD RIGHT ARM  Result Value Ref Range Status   Specimen Description   Final    BLOOD RIGHT ARM AEROBIC BOTTLE ONLY ANAEROBIC BOTTLE ONLY   Special Requests   Final    BOTTLES DRAWN AEROBIC AND ANAEROBIC Blood Culture adequate volume   Culture   Final    NO GROWTH 5 DAYS Performed at [facility], [place] [address], [place], [place] [address]    Report Status 06/19/2022 FINAL  Final  Culture, blood (Routine X 2) w Reflex to ID Panel     Status: None   Collection Time: 06/27/22  7:51 AM   Specimen: BLOOD  Result Value Ref Range Status   Specimen Description BLOOD RIGHT ANTECUBITAL  Final   Special Requests   Final    BOTTLES DRAWN AEROBIC AND ANAEROBIC Blood Culture results may not be optimal due to an inadequate volume of blood received in culture bottles   Culture   Final    NO GROWTH 5 DAYS Performed at [facility], [place] [address] [place], [place] [address]    Report Status 07/02/2022 FINAL  Final  Culture, blood (Routine X 2) w Reflex to ID Panel     Status: None   Collection Time: 06/27/22  8:02 AM   Specimen: BLOOD RIGHT FOREARM  Result Value Ref Range Status   Specimen Description BLOOD RIGHT FOREARM  Final   Special Requests   Final    BOTTLES DRAWN AEROBIC ONLY Blood Culture results may not be optimal due to an inadequate volume of blood received in culture bottles   Culture   Final    NO GROWTH 5 DAYS Performed  at [facility], [place] [address], [place], [place] [address]    Report Status 07/02/2022 FINAL  Final  Respiratory (~20 pathogens) panel by PCR     Status: None   Collection Time: 06/30/22  8:10 AM   Specimen: Nasopharyngeal Swab; Respiratory  Result Value Ref Range Status   Adenovirus NOT DETECTED NOT DETECTED Final   Coronavirus 229E NOT DETECTED NOT DETECTED Final    Comment: (NOTE) The Coronavirus on the Respiratory Panel, DOES NOT test for the novel  Coronavirus (2019 nCoV)    Coronavirus HKU1 NOT DETECTED NOT DETECTED Final   Coronavirus NL63 NOT DETECTED NOT DETECTED Final   Coronavirus OC43 NOT DETECTED NOT DETECTED Final   Metapneumovirus NOT DETECTED NOT DETECTED Final   Rhinovirus / Enterovirus NOT DETECTED NOT DETECTED Final   Influenza A NOT DETECTED NOT DETECTED Final   Influenza B NOT DETECTED NOT DETECTED Final   Parainfluenza Virus 1 NOT DETECTED NOT DETECTED Final   Parainfluenza Virus 2 NOT DETECTED NOT DETECTED Final   Parainfluenza Virus 3 NOT DETECTED NOT DETECTED Final   Parainfluenza Virus 4 NOT DETECTED NOT DETECTED Final   Respiratory Syncytial Virus NOT DETECTED NOT DETECTED Final   Bordetella pertussis NOT DETECTED NOT DETECTED Final   Bordetella Parapertussis NOT DETECTED NOT DETECTED Final   Chlamydophila pneumoniae NOT DETECTED NOT DETECTED Final   Mycoplasma pneumoniae NOT DETECTED NOT DETECTED Final    Comment: Performed at [facility], [name]. [address], [place], [place] [address]  SARS Coronavirus 2 by RT PCR (hospital order, performed in [facility] hospital lab) *cepheid single result test* Anterior Nasal Swab     Status: None   Collection Time: 06/30/22  8:10 AM   Specimen: Anterior Nasal Swab  Result Value Ref Range Status   SARS Coronavirus 2 by RT PCR NEGATIVE NEGATIVE Final    Comment: (NOTE) SARS-CoV-2 target nucleic acids are NOT DETECTED.  The SARS-CoV-2 RNA is generally detectable in upper and lower respiratory specimens  during the acute phase of infection. The lowest concentration of SARS-CoV-2 viral copies this assay can detect is 250 copies / mL. A negative result does not preclude SARS-CoV-2 infection and should not be used as the sole basis for treatment or other patient management decisions.  A negative result may occur with improper specimen collection / handling, submission of specimen other than nasopharyngeal swab, presence of viral mutation(s) within the areas targeted by this assay, and inadequate number of viral copies (<250 copies / mL). A negative result must be combined with clinical observations, patient history, and epidemiological information.  Fact Sheet for Patients:   https://www.patel.info/  Fact Sheet for Healthcare Providers: https://hall.com/  This test is not yet approved or  cleared by the [place] FDA and has been authorized for detection and/or diagnosis of SARS-CoV-2 by FDA under an Emergency Use Authorization (EUA).  This EUA will remain in effect (meaning this test can be used) for the duration of the COVID-19 declaration under Section 564(b)(1) of the Act, 21 U.S.C. section 360bbb-3(b)(1), unless the authorization is terminated or revoked sooner.  Performed at [facility], [place] [address] [place],  [address]     Labs: CBC: Recent Labs  Lab 07/02/22 0139 07/02/22 1825 07/03/22 0749 07/04/22 0029 07/05/22 0049 07/07/22 0021  WBC 12.0*  --  13.8* 12.9* 12.9* 14.0*  NEUTROABS  --   --  9.3*  --  7.9*  --   HGB 6.1* 7.5* 7.5* 7.8* 7.9* 7.3*  HCT 19.2* 22.4* 23.6* 23.9* 24.1* 22.7*  MCV 85.0  --  85.5 84.5 84.6 85.7  PLT 556*  --  575* 515* 511* 098*   Basic Metabolic Panel: Recent Labs  Lab 07/02/22 0139 07/03/22  9967 07/04/22 0029 07/05/22 0049 07/07/22 0021  NA 135 136 137 135 139  K 4.9 4.9 4.3 4.2 3.8  CL 91* 95* 95* 94* 97*  CO2 27 26 29 26 26   GLUCOSE 97 101* 84 93 116*  BUN 81*  118* 69* 107* 120*  CREATININE 4.43* 5.79* 3.11* 4.38* 4.11*  CALCIUM 8.4* 9.0 9.1 9.3 10.1  MG  --  2.7* 2.2  --   --   PHOS  --  5.2* 3.5 3.8 2.8   Liver Function Tests: Recent Labs  Lab 07/03/22 0749 07/05/22 0049 07/07/22 0021  AST 38  --   --   ALT 13  --   --   ALKPHOS 103  --   --   BILITOT 0.4  --   --   PROT 6.1*  --   --   ALBUMIN <1.5* <1.5* <1.5*   CBG: Recent Labs  Lab 07/07/22 1956 07/08/22 0024 07/08/22 0344 07/08/22 0821 07/08/22 1151  GLUCAP 77 73 89 69* 95    Discharge time spent: greater than 30 minutes.  Signed: Patrecia Pour, MD [facility] 07/08/2022

## 2022-07-08 NOTE — Plan of Care (Signed)
  Problem: Education: Goal: Knowledge of General Education information will improve Description: Including pain rating scale, medication(s)/side effects and non-pharmacologic comfort measures Outcome: Adequate for Discharge   Problem: Health Behavior/Discharge Planning: Goal: Ability to manage health-related needs will improve Outcome: Adequate for Discharge   Problem: Clinical Measurements: Goal: Ability to maintain clinical measurements within normal limits will improve Outcome: Adequate for Discharge Goal: Will remain free from infection Outcome: Adequate for Discharge Goal: Diagnostic test results will improve Outcome: Adequate for Discharge Goal: Respiratory complications will improve Outcome: Adequate for Discharge Goal: Cardiovascular complication will be avoided Outcome: Adequate for Discharge   Problem: Activity: Goal: Risk for activity intolerance will decrease Outcome: Adequate for Discharge   Problem: Nutrition: Goal: Adequate nutrition will be maintained Outcome: Adequate for Discharge   Problem: Coping: Goal: Level of anxiety will decrease Outcome: Adequate for Discharge   Problem: Elimination: Goal: Will not experience complications related to bowel motility Outcome: Adequate for Discharge   Problem: Pain Managment: Goal: General experience of comfort will improve Outcome: Adequate for Discharge   Problem: Safety: Goal: Ability to remain free from injury will improve Outcome: Adequate for Discharge   Problem: Skin Integrity: Goal: Risk for impaired skin integrity will decrease Outcome: Adequate for Discharge   

## 2022-07-08 NOTE — Progress Notes (Signed)
Attempted to call report to facility, no answer, will try again later.

## 2022-07-08 NOTE — TOC Transition Note (Addendum)
Transition of Care [facility]) - CM/SW Discharge Note   Patient Details  Name: Cody Macdonald MRN: [id number] Date of Birth: 1961/04/23  Transition of Care [facility]) CM/SW Contact:  Bethann Berkshire, Clark Phone Number: 07/08/2022, 12:25 PM   Clinical Narrative:     Patient will DC to: [facility] SNF Anticipated DC date: 07/08/2022 Family notified: Son Associate Professor Transport by: Corey Harold   Per MD patient ready for DC to [facility]. RN, patient, patient's family, and facility notified of DC. Discharge Summary and FL2 sent to facility. RN to call report prior to discharge ([phone] ). DC packet on chart. Ambulance transport requested for patient.   CSW will sign off for now as social work intervention is no longer needed. Please consult [place] again if new needs arise.   1230: CSW received call from pt son and son's s/o, [name]. Son begins asking CSW if pt can go to a hospice facility. CSW explained that the plan that son has discussed with palliative over multiple weeks during this admission has been to continue care and return to SNF. [name] clarified with Jefm Miles asking if he wanted pt's to be comfort care which would would mean that pt would discontinue care. Son didn't answer question and then asked about changing SNF's closer to pt's home. CSW explained that social work at [facility] would need to assist with a new LTC facility. CSW also explained that OP palliaitve will follow up with them for ongoing [place] discussions when at SNF. [facility] uses [facility]; referral made and notified [facility] that pt Dcing today.   Final next level of care: Skilled Nursing Facility Barriers to Discharge: No Barriers Identified   Patient Goals and CMS Choice        Discharge Placement              Patient chooses bed at:  [facility]) Patient to be transferred to facility by: [place] Name of family member notified: Son Jefm Miles Patient and family notified of of transfer: 07/08/22  Discharge Plan and  Services                                     Social Determinants of Health (SDOH) Interventions     Readmission Risk Interventions     No data to display

## 2022-07-08 NOTE — Progress Notes (Signed)
Report called to RN SBAR followed, questions asked and answered.

## 2022-07-08 NOTE — Progress Notes (Signed)
[facility]) Hospital Liaison note:  Notified by Ardeen Fillers of request for [facility] Palliative Care services. Will continue to follow for disposition.  Please call with any outpatient palliative questions or concerns.  Thank you for the opportunity to participate in this patient's care.  Thank you, Lorelee Market, LPN [facility] Liaison [phone]

## 2022-07-09 LAB — HEPATITIS B SURFACE ANTIBODY, QUANTITATIVE: Hep B S AB Quant (Post): 18.2 m[IU]/mL (ref >9.9)

## 2022-08-02 DEATH — deceased

## 2023-05-25 IMAGING — DX DG CHEST 1V PORT
1 series · 1 of 1 positions shown · non-contrast
Comparison: 12/24/2021

CLINICAL DATA: Fever

EXAM:
PORTABLE CHEST 1 VIEW

[chest]
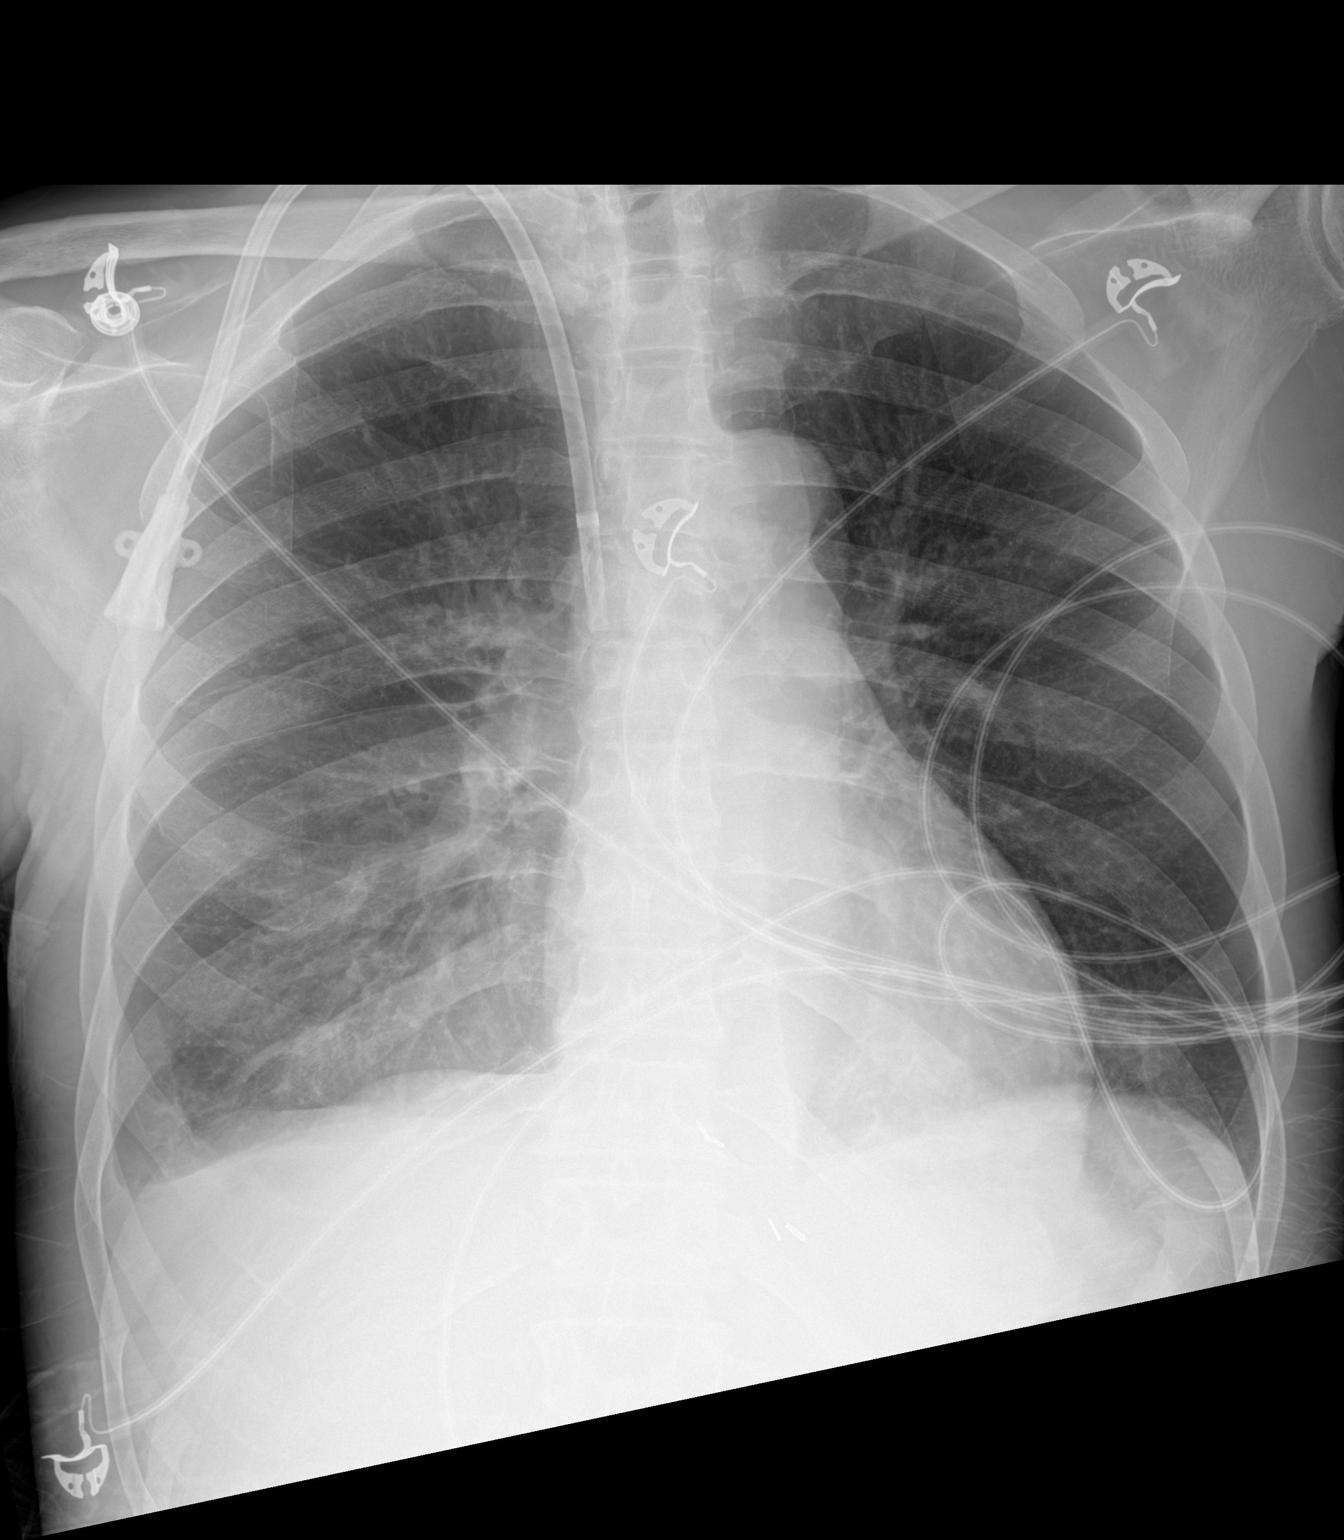

[1 of 1 positions shown; findings below may reference images not displayed]

FINDINGS: Single frontal view of the chest demonstrates right internal jugular
central venous catheter tip overlying superior vena cava. Cardiac
silhouette is unremarkable. Minimal right basilar consolidation and
trace right pleural effusion are noted. No pneumothorax. Left chest
is clear. No acute bony abnormalities.
IMPRESSION: 1. Small right pleural effusion with patchy right basilar
consolidation, not significantly changed since prior exam.
